# Patient Record
Sex: Male | Born: 1940
Health system: Southern US, Community
[De-identification: ages and names within clinical notes are randomized; demographics above are authoritative.]

## PROBLEM LIST (undated history)

## (undated) DIAGNOSIS — R350 Frequency of micturition: Secondary | ICD-10-CM

## (undated) DIAGNOSIS — I10 Essential (primary) hypertension: Secondary | ICD-10-CM

## (undated) DIAGNOSIS — F0390 Unspecified dementia without behavioral disturbance: Secondary | ICD-10-CM

## (undated) DIAGNOSIS — H269 Unspecified cataract: Secondary | ICD-10-CM

## (undated) DIAGNOSIS — R339 Retention of urine, unspecified: Secondary | ICD-10-CM

## (undated) DIAGNOSIS — E119 Type 2 diabetes mellitus without complications: Secondary | ICD-10-CM

## (undated) DIAGNOSIS — K648 Other hemorrhoids: Secondary | ICD-10-CM

## (undated) DIAGNOSIS — F209 Schizophrenia, unspecified: Secondary | ICD-10-CM

## (undated) DIAGNOSIS — K760 Fatty (change of) liver, not elsewhere classified: Secondary | ICD-10-CM

## (undated) DIAGNOSIS — R269 Unspecified abnormalities of gait and mobility: Secondary | ICD-10-CM

## (undated) DIAGNOSIS — M1990 Unspecified osteoarthritis, unspecified site: Secondary | ICD-10-CM

## (undated) DIAGNOSIS — R600 Localized edema: Secondary | ICD-10-CM

## (undated) DIAGNOSIS — E785 Hyperlipidemia, unspecified: Secondary | ICD-10-CM

## (undated) DIAGNOSIS — R609 Edema, unspecified: Secondary | ICD-10-CM

## (undated) DIAGNOSIS — D126 Benign neoplasm of colon, unspecified: Secondary | ICD-10-CM

## (undated) DIAGNOSIS — F329 Major depressive disorder, single episode, unspecified: Secondary | ICD-10-CM

## (undated) DIAGNOSIS — R5383 Other fatigue: Secondary | ICD-10-CM

## (undated) DIAGNOSIS — F32A Depression, unspecified: Secondary | ICD-10-CM

## (undated) DIAGNOSIS — R5381 Other malaise: Secondary | ICD-10-CM

## (undated) HISTORY — DX: Unspecified abnormalities of gait and mobility: R26.9

## (undated) HISTORY — DX: Other fatigue: R53.83

## (undated) HISTORY — DX: Type 2 diabetes mellitus without complications: E11.9

## (undated) HISTORY — PX: TONSILLECTOMY: SUR1361

## (undated) HISTORY — DX: Other malaise: R53.81

---

## 1999-08-23 ENCOUNTER — Other Ambulatory Visit: Admission: RE | Admit: 1999-08-23 | Discharge: 1999-08-23 | Payer: Self-pay | Admitting: Gastroenterology

## 1999-08-23 ENCOUNTER — Encounter (INDEPENDENT_AMBULATORY_CARE_PROVIDER_SITE_OTHER): Payer: Self-pay | Admitting: Specialist

## 2000-08-05 ENCOUNTER — Emergency Department (HOSPITAL_COMMUNITY): Admission: EM | Admit: 2000-08-05 | Discharge: 2000-08-05 | Payer: Self-pay | Admitting: Emergency Medicine

## 2006-07-24 ENCOUNTER — Encounter (INDEPENDENT_AMBULATORY_CARE_PROVIDER_SITE_OTHER): Payer: Self-pay | Admitting: *Deleted

## 2006-07-24 ENCOUNTER — Ambulatory Visit (HOSPITAL_COMMUNITY): Admission: RE | Admit: 2006-07-24 | Discharge: 2006-07-24 | Payer: Self-pay | Admitting: *Deleted

## 2009-03-10 ENCOUNTER — Emergency Department (HOSPITAL_COMMUNITY): Admission: EM | Admit: 2009-03-10 | Discharge: 2009-03-10 | Payer: Self-pay | Admitting: Emergency Medicine

## 2009-05-17 ENCOUNTER — Emergency Department (HOSPITAL_COMMUNITY): Admission: EM | Admit: 2009-05-17 | Discharge: 2009-05-17 | Payer: Self-pay | Admitting: Emergency Medicine

## 2009-09-28 ENCOUNTER — Encounter: Admission: RE | Admit: 2009-09-28 | Discharge: 2009-09-28 | Payer: Self-pay | Admitting: Neurology

## 2009-12-28 ENCOUNTER — Emergency Department (HOSPITAL_COMMUNITY): Admission: EM | Admit: 2009-12-28 | Discharge: 2009-05-28 | Payer: Self-pay | Admitting: Emergency Medicine

## 2010-06-05 NOTE — Op Note (Signed)
NAMECASTULO, SCARPELLI               ACCOUNT NO.:  [id number]   MEDICAL RECORD NO.:  [id number]          PATIENT TYPE:  AMB   LOCATION:  ENDO                         FACILITY:  [facility]   PHYSICIAN:  Georgiana Spinner, M.D.    DATE OF BIRTH:  05-01-1940   DATE OF PROCEDURE:  07/24/2006  DATE OF DISCHARGE:                               OPERATIVE REPORT   PROCEDURE:  Colonoscopy with polypectomy.   INDICATIONS:  Colon polyp.   ANESTHESIA:  Fentanyl 100 mcg, Versed 10 mg.   DESCRIPTION OF PROCEDURE:  With the patient mildly sedated in the left  lateral decubitus position, a rectal exam was performed which was  unremarkable.  Subsequently, the Pentax videoscopic colonoscope was  inserted in the rectum and passed under direct vision to the cecum  identified by the ileocecal valve and appendiceal orifice, both of which  were photographed. From this point, the colonoscope was slowly withdrawn  taking circumferential views of the colonic mucosa stopping in the  transverse colon where a polyp was seen, photographed, and removed using  snare cautery technique setting of 20/150 blended current.  The tissue  was then suctioned into the endoscope and retrieved via the tissue trap.  The endoscope was then further withdrawn taking circumferential views of  the remaining colonic mucosa stopping in the rectum which appeared  normal on direct and showed hemorrhoids on retroflexed view. The  endoscope was straightened and withdrawn.  The patient's vital signs and  pulse oximeter remained stable.  The patient tolerated the procedure  well without apparent complications.   FINDINGS:  Polyp of transverse colon removed.  Internal hemorrhoids.  Otherwise, unremarkable exam.   PLAN:  Await biopsy report.  The patient will call me for results and  follow up with me as needed as an outpatient.           ______________________________  Georgiana Spinner, M.D.     GMO/MEDQ  D:  07/24/2006  T:  07/24/2006  Job:   [id number]

## 2010-07-29 ENCOUNTER — Emergency Department (HOSPITAL_COMMUNITY)
Admission: EM | Admit: 2010-07-29 | Discharge: 2010-07-29 | Disposition: A | Discharge status: Home / Self Care | Payer: Medicare Other | Attending: Emergency Medicine | Admitting: Emergency Medicine

## 2010-07-29 ENCOUNTER — Emergency Department (HOSPITAL_COMMUNITY): Payer: Medicare Other

## 2010-07-29 DIAGNOSIS — I10 Essential (primary) hypertension: Secondary | ICD-10-CM | POA: Insufficient documentation

## 2010-07-29 DIAGNOSIS — E119 Type 2 diabetes mellitus without complications: Secondary | ICD-10-CM | POA: Insufficient documentation

## 2010-07-29 DIAGNOSIS — F329 Major depressive disorder, single episode, unspecified: Secondary | ICD-10-CM | POA: Insufficient documentation

## 2010-07-29 DIAGNOSIS — Z79899 Other long term (current) drug therapy: Secondary | ICD-10-CM | POA: Insufficient documentation

## 2010-07-29 DIAGNOSIS — F3289 Other specified depressive episodes: Secondary | ICD-10-CM | POA: Insufficient documentation

## 2010-07-29 DIAGNOSIS — M19019 Primary osteoarthritis, unspecified shoulder: Secondary | ICD-10-CM | POA: Insufficient documentation

## 2011-12-23 ENCOUNTER — Other Ambulatory Visit: Payer: Self-pay | Admitting: Endocrinology

## 2011-12-23 DIAGNOSIS — R1011 Right upper quadrant pain: Secondary | ICD-10-CM

## 2011-12-31 ENCOUNTER — Ambulatory Visit
Admission: RE | Admit: 2011-12-31 | Discharge: 2011-12-31 | Disposition: A | Discharge status: Home / Self Care | Payer: Medicare Other | Source: Ambulatory Visit | Attending: Endocrinology | Admitting: Endocrinology

## 2011-12-31 DIAGNOSIS — R1011 Right upper quadrant pain: Secondary | ICD-10-CM

## 2012-01-01 ENCOUNTER — Other Ambulatory Visit: Payer: Self-pay | Admitting: Gastroenterology

## 2012-01-01 ENCOUNTER — Ambulatory Visit: Payer: Medicare Other | Admitting: *Deleted

## 2012-01-03 ENCOUNTER — Encounter (HOSPITAL_COMMUNITY): Payer: Self-pay | Admitting: Pharmacy Technician

## 2012-01-07 ENCOUNTER — Emergency Department (HOSPITAL_COMMUNITY): Payer: Medicare Other

## 2012-01-07 ENCOUNTER — Encounter (HOSPITAL_COMMUNITY): Payer: Self-pay | Admitting: *Deleted

## 2012-01-07 ENCOUNTER — Emergency Department (HOSPITAL_COMMUNITY)
Admission: EM | Admit: 2012-01-07 | Discharge: 2012-01-07 | Disposition: A | Discharge status: Home / Self Care | Payer: Medicare Other | Attending: Emergency Medicine | Admitting: Emergency Medicine

## 2012-01-07 DIAGNOSIS — Y939 Activity, unspecified: Secondary | ICD-10-CM | POA: Insufficient documentation

## 2012-01-07 DIAGNOSIS — E119 Type 2 diabetes mellitus without complications: Secondary | ICD-10-CM | POA: Insufficient documentation

## 2012-01-07 DIAGNOSIS — Z79899 Other long term (current) drug therapy: Secondary | ICD-10-CM | POA: Insufficient documentation

## 2012-01-07 DIAGNOSIS — Y929 Unspecified place or not applicable: Secondary | ICD-10-CM | POA: Insufficient documentation

## 2012-01-07 DIAGNOSIS — F3289 Other specified depressive episodes: Secondary | ICD-10-CM | POA: Insufficient documentation

## 2012-01-07 DIAGNOSIS — F0390 Unspecified dementia without behavioral disturbance: Secondary | ICD-10-CM | POA: Insufficient documentation

## 2012-01-07 DIAGNOSIS — IMO0001 Reserved for inherently not codable concepts without codable children: Secondary | ICD-10-CM | POA: Insufficient documentation

## 2012-01-07 DIAGNOSIS — X58XXXA Exposure to other specified factors, initial encounter: Secondary | ICD-10-CM | POA: Insufficient documentation

## 2012-01-07 DIAGNOSIS — F329 Major depressive disorder, single episode, unspecified: Secondary | ICD-10-CM | POA: Insufficient documentation

## 2012-01-07 DIAGNOSIS — I10 Essential (primary) hypertension: Secondary | ICD-10-CM | POA: Insufficient documentation

## 2012-01-07 DIAGNOSIS — S29012A Strain of muscle and tendon of back wall of thorax, initial encounter: Secondary | ICD-10-CM

## 2012-01-07 DIAGNOSIS — S239XXA Sprain of unspecified parts of thorax, initial encounter: Secondary | ICD-10-CM | POA: Insufficient documentation

## 2012-01-07 HISTORY — DX: Depression, unspecified: F32.A

## 2012-01-07 HISTORY — DX: Major depressive disorder, single episode, unspecified: F32.9

## 2012-01-07 HISTORY — DX: Unspecified dementia, unspecified severity, without behavioral disturbance, psychotic disturbance, mood disturbance, and anxiety: F03.90

## 2012-01-07 HISTORY — DX: Essential (primary) hypertension: I10

## 2012-01-07 HISTORY — DX: Type 2 diabetes mellitus without complications: E11.9

## 2012-01-07 MED ORDER — TRAMADOL HCL 50 MG PO TABS
50.0000 mg | ORAL_TABLET | Freq: Once | ORAL | Status: AC
  Administered 2012-01-07: 50 mg via ORAL
  Filled 2012-01-07: qty 1

## 2012-01-07 MED ORDER — TRAMADOL HCL 50 MG PO TABS: 50.0000 mg | ORAL_TABLET | Freq: Four times a day (QID) | ORAL | Status: DC | PRN

## 2012-01-07 MED ORDER — METHOCARBAMOL 500 MG PO TABS
500.0000 mg | ORAL_TABLET | Freq: Once | ORAL | Status: AC
  Administered 2012-01-07: 500 mg via ORAL
  Filled 2012-01-07: qty 1

## 2012-01-07 MED ORDER — METHOCARBAMOL 500 MG PO TABS: 500.0000 mg | ORAL_TABLET | Freq: Two times a day (BID) | ORAL | Status: DC

## 2012-01-07 NOTE — ED Provider Notes (Signed)
History     CSN: [id number]  Arrival date & time 01/07/12  0865   First MD Initiated Contact with Patient 01/07/12 [phone]      Chief Complaint  Patient presents with  . Back Pain    (Consider location/radiation/quality/duration/timing/severity/associated sxs/prior treatment) HPI Pt has had 3 weeks of upper back pain in R paraspinal area of thoracic spine worse with movement. No known trauma. No cough, SOB, CP, fever or chills. No focal weakness or sensory changes.  Past Medical History  Diagnosis Date  . Diabetes mellitus without complication   . Hypertension   . Dementia   . Depression     Past Surgical History  Procedure Date  . Tonsillectomy     No family history on file.  History  Substance Use Topics  . Smoking status: Never Smoker   . Smokeless tobacco: Not on file  . Alcohol Use: No      Review of Systems  Constitutional: Negative for fever and chills.  HENT: Negative for neck pain and neck stiffness.   Respiratory: Negative for cough, chest tightness, shortness of breath and wheezing.   Cardiovascular: Negative for chest pain, palpitations and leg swelling.  Gastrointestinal: Negative for nausea, vomiting and abdominal pain.  Musculoskeletal: Positive for myalgias and back pain.  Skin: Negative for rash and wound.  Neurological: Negative for dizziness, weakness, light-headedness, numbness and headaches.  All other systems reviewed and are negative.    Allergies  Codeine and Tylenol  Home Medications   Current Outpatient Rx  Name  Route  Sig  Dispense  Refill  . ALPRAZOLAM 0.5 MG PO TABS   Oral   Take 1 mg by mouth every evening.          . CELECOXIB 200 MG PO CAPS   Oral   Take 200 mg by mouth 2 (two) times daily.         Marland Kitchen CLOBETASOL PROPIONATE 0.05 % EX OINT   Topical   Apply 1 application topically 2 (two) times daily.         Marland Kitchen FLUVOXAMINE MALEATE 100 MG PO TABS   Oral   Take 100 mg by mouth at bedtime.          .  FUROSEMIDE 20 MG PO TABS   Oral   Take 20 mg by mouth daily before breakfast.          . GLIMEPIRIDE 2 MG PO TABS   Oral   Take 2 mg by mouth daily before breakfast.         . HYDROXYZINE HCL 25 MG PO TABS   Oral   Take 25 mg by mouth at bedtime.         Marland Kitchen MINOCYCLINE HCL 50 MG PO TABS   Oral   Take 50 mg by mouth daily.         Marland Kitchen MOEXIPRIL-HYDROCHLOROTHIAZIDE 15-12.5 MG PO TABS   Oral   Take 1 tablet by mouth daily after lunch.          Marland Kitchen NAPROXEN SODIUM 220 MG PO TABS   Oral   Take 220 mg by mouth 2 (two) times daily with a meal.         . OLANZAPINE 20 MG PO TABS   Oral   Take 20 mg by mouth at bedtime.         . ONGLYZA 5 MG PO TABS   Oral   Take 5 mg by mouth daily before breakfast.          .  PRAVASTATIN SODIUM 40 MG PO TABS   Oral   Take 40 mg by mouth daily after lunch.          . METHOCARBAMOL 500 MG PO TABS   Oral   Take 1 tablet (500 mg total) by mouth 2 (two) times daily.   20 tablet   0   . TRAMADOL HCL 50 MG PO TABS   Oral   Take 1 tablet (50 mg total) by mouth every 6 (six) hours as needed for pain.   15 tablet   0   . AVO CREAM EX   Apply externally   Apply 1 application topically every evening.           BP 156/76  Pulse 84  Temp 98.3 F (36.8 C) (Oral)  Resp 20  SpO2 96%  Physical Exam  Nursing note and vitals reviewed. Constitutional: He is oriented to person, place, and time. He appears well-developed and well-nourished. No distress.  HENT:  Head: Normocephalic and atraumatic.  Mouth/Throat: Oropharynx is clear and moist.  Eyes: EOM are normal. Pupils are equal, round, and reactive to light.  Neck: Normal range of motion. Neck supple.  Cardiovascular: Normal rate and regular rhythm.   Pulmonary/Chest: Effort normal and breath sounds normal. No respiratory distress. He has no wheezes. He has no rales. He exhibits no tenderness.  Abdominal: Soft. Bowel sounds are normal. He exhibits no distension and no mass.  There is no tenderness. There is no rebound and no guarding.  Musculoskeletal: Normal range of motion. He exhibits tenderness (Mid thoracic R paraspinal TTP. No deformity or injury. ). He exhibits no edema.  Neurological: He is alert and oriented to person, place, and time.       5/5 motor in all ext, sensation intact.   Skin: Skin is warm and dry. No rash noted. No erythema.  Psychiatric: He has a normal mood and affect. His behavior is normal.    ED Course  Procedures (including critical care time)  Labs Reviewed - No data to display Dg Chest 2 View  01/07/2012  *RADIOLOGY REPORT*  Clinical Data: Chest pain.  CHEST - 2 VIEW  Comparison: 12/18/2011.  Findings: The cardiac silhouette, mediastinal and hilar contours are within normal limits and stable given the rotation of the patient.  The lungs are clear.  The bony thorax is intact. Advanced generative changes are noted in the thoracic spine.  IMPRESSION: No acute cardiopulmonary findings.   Original Report Authenticated By: Rudie Meyer, M.D.      1. Muscle strain of right upper back       MDM  Well appearing. Xray to rule out fracture. Will treat symptomatically.    Pt states pain is completely resolved.      Loren Racer, MD 01/07/12 [phone]

## 2012-01-07 NOTE — ED Notes (Signed)
Pt in radiology 

## 2012-01-07 NOTE — ED Notes (Addendum)
Per PTAR- pt has had back pain for several weeks. Pain was worse this morning. Pt states that pain is worse with trying to stand. Pain is in rt upper and mid back.

## 2012-01-10 ENCOUNTER — Encounter (HOSPITAL_COMMUNITY): Admission: RE | Payer: Self-pay | Source: Ambulatory Visit

## 2012-01-10 ENCOUNTER — Ambulatory Visit (HOSPITAL_COMMUNITY): Admission: RE | Admit: 2012-01-10 | Payer: Medicare Other | Source: Ambulatory Visit | Admitting: Gastroenterology

## 2012-01-10 SURGERY — EGD (ESOPHAGOGASTRODUODENOSCOPY)
Anesthesia: Moderate Sedation

## 2012-01-29 ENCOUNTER — Ambulatory Visit: Payer: Medicare Other | Admitting: Dietician

## 2012-01-30 ENCOUNTER — Ambulatory Visit: Payer: Medicare Other | Admitting: *Deleted

## 2012-02-13 ENCOUNTER — Other Ambulatory Visit: Payer: Self-pay | Admitting: Gastroenterology

## 2012-02-20 ENCOUNTER — Ambulatory Visit: Payer: Medicare Other | Admitting: *Deleted

## 2012-02-20 ENCOUNTER — Encounter: Payer: Medicare Other | Attending: Endocrinology | Admitting: *Deleted

## 2012-02-20 ENCOUNTER — Encounter: Payer: Self-pay | Admitting: *Deleted

## 2012-02-20 VITALS — Ht 69.5 in | Wt 250.0 lb

## 2012-02-20 DIAGNOSIS — E119 Type 2 diabetes mellitus without complications: Secondary | ICD-10-CM

## 2012-02-20 DIAGNOSIS — Z713 Dietary counseling and surveillance: Secondary | ICD-10-CM | POA: Insufficient documentation

## 2012-02-20 NOTE — Progress Notes (Signed)
HbA1c: 8%  Medical Nutrition Therapy:  Appt start time: 1130 end time:  1230.   Assessment:  Primary concerns today: diabetes.  Ricky [name] is a 72 year old gentlemen who has been diagnosed with DMII for 6-7 years.  He lives in a retirement home and doesn't prepare his own meals. His short tem memory is very poor and he generally forgets all diabetes information.  His HbA1C is 8%   MEDICATIONS: see list   DIETARY INTAKE:  Usual eating pattern includes 2-3 meals and 1-3 snacks per day.  Everyday foods include starches, fruits, proteins, vegetable juice, and sweets.  Avoided foods include many vegetables.    24-hr recall:  B ( AM): 1-2 bowl cheerios with 2 large glasses tomato juice and sometimes melons  Snk ( AM): payday candy bar  L ( PM): goes out sometimes to cafeteria and gets talapia and maybe green beans.  Or eats in dining hall at [employer] and has fish with 2 glasses tomato juice and melon.  Sometimes goes to [employer] and gets hamburger and fries. Diet soda Snk ( PM): not usually D ( PM): sometimes eats in dining hall or sometimes caretaker brings food.  Oven roasted deli chicken breast or pizza Snk ( PM): diet cokes  Sometimes has Nabs Beverages: tomato juice, diet coke, sometimes water  Usual physical activity: wheelchair bound.  Does stationary bike most days  Estimated energy needs: 1800-2000 calories 200-225 g carbohydrates 135-150 g protein 50-56 g fat  Progress Towards Goal(s):  In progress.   Nutritional Diagnosis:  NB-1.1 Food and nutrition-related knowledge deficit As related to limited memory abilities.  As evidenced by inability to remember nutrition education from previous appoitnment or today's appointment..    Intervention:  Nutrition counseling provided.  Ricky [name] was referred for diabetes education.  It was evident very early on in the appointment that Ricky [name] has limited reading abilities and limited memory abilities.  Most of the information presented  today has to be repeated several times.  Ricky [name] brought his caregiver, Ricky [name], with him, and I presented Ricky [name] with some written materials.  Ricky [name] doesn't prepare his own meals- he wither eats out or eats in the dining hall at his facility.  He doesn't have that much control over his food choices.  I did encourage him to limit sweets like candy bars and ice cream.  He eats large portions of carbohydrates at lunch and/or dinner, and very little carbohydrates throughout the day.  He monitors his glucose and it's sometimes 90-125, but sometimes 300 mg/dl.  We discussed what a serving of carbohydrate looks like and we discussed MyPlate recommendations.  Encouraged smaller portions of carbohydrates and more lean proteins and vegetables at each meal.    Handouts given during visit include:  Living Well with Diabetes  MyPlate placemat  Monitoring/Evaluation:  Dietary intake, exercise, BGM, and body weight prn.

## 2012-02-20 NOTE — Patient Instructions (Addendum)
Aim for 3 meals and 1-2 snacks each day  At each meals and snack, aim for some more of carbohydrate, but a small portion; fruit or cereal or bread or potato or rice, but not too much  Choose also a protein like egg, fish, chicken, nuts  Choose a vegetable at lunch and dinner

## 2012-02-21 ENCOUNTER — Ambulatory Visit (HOSPITAL_COMMUNITY)
Admission: RE | Admit: 2012-02-21 | Discharge: 2012-02-21 | Disposition: A | Discharge status: Skilled Nursing Facility | Payer: Medicare Other | Source: Ambulatory Visit | Attending: Gastroenterology | Admitting: Gastroenterology

## 2012-02-21 ENCOUNTER — Encounter (HOSPITAL_COMMUNITY): Payer: Self-pay

## 2012-02-21 ENCOUNTER — Encounter (HOSPITAL_COMMUNITY)
Admission: RE | Disposition: A | Discharge status: Skilled Nursing Facility | Payer: Self-pay | Source: Ambulatory Visit | Attending: Gastroenterology

## 2012-02-21 DIAGNOSIS — K2970 Gastritis, unspecified, without bleeding: Secondary | ICD-10-CM | POA: Insufficient documentation

## 2012-02-21 DIAGNOSIS — K2990 Gastroduodenitis, unspecified, without bleeding: Secondary | ICD-10-CM | POA: Insufficient documentation

## 2012-02-21 DIAGNOSIS — K449 Diaphragmatic hernia without obstruction or gangrene: Secondary | ICD-10-CM | POA: Insufficient documentation

## 2012-02-21 DIAGNOSIS — A048 Other specified bacterial intestinal infections: Secondary | ICD-10-CM | POA: Insufficient documentation

## 2012-02-21 DIAGNOSIS — K2960 Other gastritis without bleeding: Secondary | ICD-10-CM | POA: Insufficient documentation

## 2012-02-21 DIAGNOSIS — K2270 Barrett's esophagus without dysplasia: Secondary | ICD-10-CM | POA: Insufficient documentation

## 2012-02-21 DIAGNOSIS — E785 Hyperlipidemia, unspecified: Secondary | ICD-10-CM | POA: Insufficient documentation

## 2012-02-21 DIAGNOSIS — K222 Esophageal obstruction: Secondary | ICD-10-CM | POA: Insufficient documentation

## 2012-02-21 DIAGNOSIS — E119 Type 2 diabetes mellitus without complications: Secondary | ICD-10-CM | POA: Insufficient documentation

## 2012-02-21 HISTORY — DX: Other hemorrhoids: K64.8

## 2012-02-21 HISTORY — DX: Localized edema: R60.0

## 2012-02-21 HISTORY — DX: Unspecified cataract: H26.9

## 2012-02-21 HISTORY — DX: Benign neoplasm of colon, unspecified: D12.6

## 2012-02-21 HISTORY — DX: Frequency of micturition: R35.0

## 2012-02-21 HISTORY — DX: Schizophrenia, unspecified: F20.9

## 2012-02-21 HISTORY — DX: Unspecified osteoarthritis, unspecified site: M19.90

## 2012-02-21 HISTORY — DX: Fatty (change of) liver, not elsewhere classified: K76.0

## 2012-02-21 HISTORY — DX: Edema, unspecified: R60.9

## 2012-02-21 HISTORY — DX: Hyperlipidemia, unspecified: E78.5

## 2012-02-21 HISTORY — PX: ESOPHAGOGASTRODUODENOSCOPY: SHX5428

## 2012-02-21 SURGERY — EGD (ESOPHAGOGASTRODUODENOSCOPY)
Anesthesia: Moderate Sedation

## 2012-02-21 MED ORDER — BUTAMBEN-TETRACAINE-BENZOCAINE 2-2-14 % EX AERO
INHALATION_SPRAY | CUTANEOUS | Status: DC | PRN
  Administered 2012-02-21: 2 via TOPICAL

## 2012-02-21 MED ORDER — FENTANYL CITRATE 0.05 MG/ML IJ SOLN
INTRAMUSCULAR | Status: DC | PRN
  Administered 2012-02-21: 25 ug via INTRAVENOUS
  Administered 2012-02-21: 12.5 ug via INTRAVENOUS
  Administered 2012-02-21: 25 ug via INTRAVENOUS

## 2012-02-21 MED ORDER — MIDAZOLAM HCL 10 MG/2ML IJ SOLN
INTRAMUSCULAR | Status: DC | PRN
  Administered 2012-02-21: 1 mg via INTRAVENOUS
  Administered 2012-02-21 (×2): 2 mg via INTRAVENOUS

## 2012-02-21 MED ORDER — SODIUM CHLORIDE 0.9 % IV SOLN
INTRAVENOUS | Status: DC
  Administered 2012-02-21: 09:00:00 via INTRAVENOUS

## 2012-02-21 MED ORDER — FENTANYL CITRATE 0.05 MG/ML IJ SOLN
INTRAMUSCULAR | Status: AC
  Filled 2012-02-21: qty 2

## 2012-02-21 MED ORDER — MIDAZOLAM HCL 10 MG/2ML IJ SOLN
INTRAMUSCULAR | Status: AC
  Filled 2012-02-21: qty 2

## 2012-02-21 MED ORDER — DIPHENHYDRAMINE HCL 50 MG/ML IJ SOLN
INTRAMUSCULAR | Status: AC
  Filled 2012-02-21: qty 1

## 2012-02-21 NOTE — Op Note (Signed)
[facility] [address] [place], [address]   OPERATIVE PROCEDURE REPORT  PATIENT: Ricky [name], Ricky [name]  MR#: [id number] BIRTHDATE: 1941/01/15  GENDER: Male ENDOSCOPIST: Jeani Hawking, MD ASSISTANT:   Felecia Shelling, RN, Harold Barban, RN, and Oletha Blend, technician PROCEDURE DATE: 02/21/2012 PROCEDURE:   EGD w/ biopsy and Savary dilation of esophagus ASA CLASS:   Class III INDICATIONS:Dysphagia. MEDICATIONS: Versed 5 mg IV and Fentanyl 62.5 mcg IV TOPICAL ANESTHETIC:   Cetacaine Spray  DESCRIPTION OF PROCEDURE:   After the risks benefits and alternatives of the procedure were thoroughly explained, informed consent was obtained.  The Pentax Gastroscope [id number]  endoscope was introduced through the mouth  and advanced to the second portion of the duodenum Without limitations.      The instrument was slowly withdrawn as the mucosa was fully examined.  FINDINGS: Just below the UES starting at 20-22 cm the Z-line was encountered.  It was apparent that the patient as gross evidence of Barrett's esophagus.  The abnormal mucosa extended to 40 cm.  No evidence of any nodules or masses and cold biopsies were obtinaed in a four quadrant fashion in approximate 2 cm intervals.  In the uper esophagus several rings were identified and I suspec that they are peptic in nature rather than EoE. A Savary dilation over a guidewire with the 16 and 17 mm dilators was performed.  Only with the 17 mm dilation was there evidence of mucosal disruption in the distal upper esophageal ring.  The 16 mm dilation was negative for mucosal disruption with the relook endoscopy.  A 3 cm hiatal hernia was identified.  A mild gastritis in the body of the stomach was biopsied with the cold biopsy forceps.   Retroflexed views revealed no abnormalities.     The scope was then withdrawn from the patient and the procedure terminated.  COMPLICATIONS: There were no complications.  IMPRESSION: 1)  Suspected long segment Barrett's esophagus. 2) Upper esophageal rings. 3) 3 cm hiatal hernia. 4) Gastritis.  RECOMMENDATIONS: 1) Await biopsy results. 2) Start Omeprazole 40 mg QD. 3) One month office follow up. _______________________________ eSigned:  Jeani Hawking, MD 02/21/2012 10:22 AM    PATIENT NAME:  Grant, Swager MR#: [id number]

## 2012-02-21 NOTE — H&P (Signed)
Gareth Eagle HPI: This is a 72 year old male with complaints of dysphagia to both solids and liquids, however, he seems to have a solid food predominance.  At times he feels that his food will go down his "windpipe".  No prior history of stokes and he denies any problems with odynophagia.  Past Medical History  Diagnosis Date  . Diabetes mellitus without complication   . Hypertension   . Dementia   . Depression   . Arthritis   . Hyperlipidemia   . Cataract   . Urinary frequency   . Fatty liver   . Schizophrenia   . Peripheral edema   . Internal hemorrhoids   . Adenomatous colon polyp     Past Surgical History  Procedure Date  . Tonsillectomy     History reviewed. No pertinent family history.  Social History:  reports that he has never smoked. He does not have any smokeless tobacco history on file. He reports that he does not drink alcohol or use illicit drugs.  Allergies:  Allergies  Allergen Reactions  . Asa (Aspirin)     "Dr told me not to take it"  . Codeine Other (See Comments)    DIZZINESS with Tylenol 3  . Tylenol (Acetaminophen) Other (See Comments)    Dizziness with tylenol 3    Medications:  Scheduled:  Continuous:   . sodium chloride 20 mL/hr at 02/21/12 3086    Results for orders placed during the hospital encounter of 02/21/12 (from the past 24 hour(s))  GLUCOSE, CAPILLARY     Status: Abnormal   Collection Time   02/21/12  9:21 AM      Component Value Range   Glucose-Capillary 141 (*) 70 - 99 mg/dL     No results found.  ROS:  As stated above in the HPI otherwise negative.  Blood pressure 142/89, temperature 97.5 F (36.4 C), temperature source Oral, resp. rate 15, SpO2 99.00%.    PE: Gen: NAD, Alert and Oriented HEENT:  [place]/AT, EOMI Neck: Supple, no LAD Lungs: CTA Bilaterally CV: RRR without M/G/R ABM: Soft, NTND, +BS Ext: No C/C/E  Assessment/Plan: 1) Dysphagia.  Plan: 1) EGD +/- Dilation  Koua Deeg D 02/21/2012, 9:35 AM

## 2012-02-24 ENCOUNTER — Encounter (HOSPITAL_COMMUNITY): Payer: Self-pay | Admitting: Gastroenterology

## 2012-03-20 ENCOUNTER — Encounter: Payer: Self-pay | Admitting: *Deleted

## 2012-03-20 DIAGNOSIS — R5383 Other fatigue: Secondary | ICD-10-CM

## 2012-03-20 DIAGNOSIS — E1149 Type 2 diabetes mellitus with other diabetic neurological complication: Secondary | ICD-10-CM

## 2012-03-20 DIAGNOSIS — R5381 Other malaise: Secondary | ICD-10-CM | POA: Insufficient documentation

## 2012-03-20 DIAGNOSIS — R269 Unspecified abnormalities of gait and mobility: Secondary | ICD-10-CM | POA: Insufficient documentation

## 2012-04-20 ENCOUNTER — Ambulatory Visit: Payer: Self-pay | Admitting: Neurology

## 2012-05-13 ENCOUNTER — Other Ambulatory Visit: Payer: Self-pay | Admitting: Gastroenterology

## 2012-05-22 ENCOUNTER — Encounter (HOSPITAL_COMMUNITY): Payer: Self-pay | Admitting: *Deleted

## 2012-05-22 ENCOUNTER — Ambulatory Visit (HOSPITAL_COMMUNITY)
Admission: RE | Admit: 2012-05-22 | Discharge: 2012-05-22 | Disposition: A | Discharge status: Skilled Nursing Facility | Payer: Medicare Other | Source: Ambulatory Visit | Attending: Gastroenterology | Admitting: Gastroenterology

## 2012-05-22 ENCOUNTER — Encounter (HOSPITAL_COMMUNITY)
Admission: RE | Disposition: A | Discharge status: Skilled Nursing Facility | Payer: Self-pay | Source: Ambulatory Visit | Attending: Gastroenterology

## 2012-05-22 DIAGNOSIS — K222 Esophageal obstruction: Secondary | ICD-10-CM | POA: Insufficient documentation

## 2012-05-22 DIAGNOSIS — E119 Type 2 diabetes mellitus without complications: Secondary | ICD-10-CM | POA: Insufficient documentation

## 2012-05-22 DIAGNOSIS — F0390 Unspecified dementia without behavioral disturbance: Secondary | ICD-10-CM | POA: Insufficient documentation

## 2012-05-22 DIAGNOSIS — I10 Essential (primary) hypertension: Secondary | ICD-10-CM | POA: Insufficient documentation

## 2012-05-22 DIAGNOSIS — K2270 Barrett's esophagus without dysplasia: Secondary | ICD-10-CM | POA: Insufficient documentation

## 2012-05-22 DIAGNOSIS — K449 Diaphragmatic hernia without obstruction or gangrene: Secondary | ICD-10-CM | POA: Insufficient documentation

## 2012-05-22 DIAGNOSIS — K7689 Other specified diseases of liver: Secondary | ICD-10-CM | POA: Insufficient documentation

## 2012-05-22 DIAGNOSIS — Z886 Allergy status to analgesic agent status: Secondary | ICD-10-CM | POA: Insufficient documentation

## 2012-05-22 DIAGNOSIS — Z885 Allergy status to narcotic agent status: Secondary | ICD-10-CM | POA: Insufficient documentation

## 2012-05-22 DIAGNOSIS — F209 Schizophrenia, unspecified: Secondary | ICD-10-CM | POA: Insufficient documentation

## 2012-05-22 DIAGNOSIS — K648 Other hemorrhoids: Secondary | ICD-10-CM | POA: Insufficient documentation

## 2012-05-22 DIAGNOSIS — E785 Hyperlipidemia, unspecified: Secondary | ICD-10-CM | POA: Insufficient documentation

## 2012-05-22 HISTORY — PX: BALLOON DILATION: SHX5330

## 2012-05-22 HISTORY — PX: ESOPHAGOGASTRODUODENOSCOPY: SHX5428

## 2012-05-22 LAB — GLUCOSE, CAPILLARY: Glucose-Capillary: 146 mg/dL — ABNORMAL HIGH (ref 70–99)

## 2012-05-22 SURGERY — EGD (ESOPHAGOGASTRODUODENOSCOPY)
Anesthesia: Moderate Sedation

## 2012-05-22 MED ORDER — BUTAMBEN-TETRACAINE-BENZOCAINE 2-2-14 % EX AERO
INHALATION_SPRAY | CUTANEOUS | Status: DC | PRN
  Administered 2012-05-22: 2 via TOPICAL

## 2012-05-22 MED ORDER — FENTANYL CITRATE 0.05 MG/ML IJ SOLN
INTRAMUSCULAR | Status: AC
  Filled 2012-05-22: qty 4

## 2012-05-22 MED ORDER — SODIUM CHLORIDE 0.9 % IV SOLN: INTRAVENOUS | Status: DC

## 2012-05-22 MED ORDER — FENTANYL CITRATE 0.05 MG/ML IJ SOLN
INTRAMUSCULAR | Status: DC | PRN
  Administered 2012-05-22 (×3): 25 ug via INTRAVENOUS

## 2012-05-22 MED ORDER — MIDAZOLAM HCL 10 MG/2ML IJ SOLN
INTRAMUSCULAR | Status: DC | PRN
  Administered 2012-05-22 (×3): 2.5 mg via INTRAVENOUS

## 2012-05-22 MED ORDER — MIDAZOLAM HCL 10 MG/2ML IJ SOLN
INTRAMUSCULAR | Status: AC
  Filled 2012-05-22: qty 4

## 2012-05-22 NOTE — Op Note (Signed)
[facility] [address] [place], [address]   OPERATIVE PROCEDURE REPORT  PATIENT: Ricky [name], Ricky [name]  MR#: [id number] BIRTHDATE: [birth year]  GENDER: Male ENDOSCOPIST: Jeani Hawking, MD ASSISTANT:   Cathlean Marseilles, RN, CGRN and Oletha Blend, technician PROCEDURE DATE: 05/22/2012 PROCEDURE:   Savary dilation of esophagus ASA CLASS:   Class III INDICATIONS:Dysphagia. MEDICATIONS: Versed-Detailed 7.5 mg IV and Fentanyl 75 mcg IV TOPICAL ANESTHETIC:   Cetacaine Spray  DESCRIPTION OF PROCEDURE:   After the risks benefits and alternatives of the procedure were thoroughly explained, informed consent was obtained.  The     endoscope was introduced through the mouth  and advanced to the second portion of the duodenum Without limitations.      The instrument was slowly withdrawn as the mucosa was fully examined.   FINDINGS: A mildly strictured esophageal ring was again identified at 25 cm.  A couple of other proximal rings were noted, but these were not strictured.  The pan-Barrett's esophagus was also noted as well as a 3 cm hiatal hernia.  No other abnormalities found in the upper GI tract.  Over a guidewire a one time dilation with the 17 mm Savary dilator was performed.  The relook endoscopy was significant for the expected esophageal tear at the stricture.  No evidence of post procedure crepitus with palpation.   Retroflexed views revealed no abnormalities.     The scope was then withdrawn from the patient and the procedure terminated.  COMPLICATIONS: There were no complications. IMPRESSION: 1) Esophageal ring at 25 cm - mildly strictured s/p 17 mm Savary dilation. 2) Pan-Barrett's esophagus. 3) 3 cm hiatal hernia.  RECOMMENDATIONS: 1) Continue with omeprazole. 2) Follow up or call as needed. 3) EGD with dilation PRN.   _______________________________ Rosalie DoctorJeani Hawking, MD 05/22/2012 12:35 PM

## 2012-05-22 NOTE — H&P (Signed)
Reason for Consult: Dysphagia with history of mild esophageal rings with resultant strictures Referring Physician: Darci Needle, M.D.  Gareth Eagle HPI: This is a 72 year old male with a prior history of dysphagia.  His most recent EGD late last year revealed peptic esophageal rings that were successfully dilated with the 17 mm Savary dilator in the setting of a 3 cm hiatal hernia.  He was placed on omeprazole indefinitely, but he reports a recurrence of his symptoms.  The patient was also identified to have a pan-Barrett's esophagus that was negative for any LGD or HGD with biopsies.  After the dilation the patient reported a 90% improvement in his symptoms.  Past Medical History  Diagnosis Date  . Diabetes mellitus without complication   . Hypertension   . Dementia   . Depression   . Arthritis   . Hyperlipidemia   . Cataract   . Urinary frequency   . Fatty liver   . Schizophrenia   . Peripheral edema   . Internal hemorrhoids   . Adenomatous colon polyp     Past Surgical History  Procedure Laterality Date  . Tonsillectomy    . Esophagogastroduodenoscopy  02/21/2012    Procedure: ESOPHAGOGASTRODUODENOSCOPY (EGD);  Surgeon: Theda Belfast, MD;  Location: [facility];  Service: Endoscopy;  Laterality: N/A;    History reviewed. No pertinent family history.  Social History:  reports that he has never smoked. He has never used smokeless tobacco. He reports that he does not drink alcohol or use illicit drugs.  Allergies:  Allergies  Allergen Reactions  . Asa (Aspirin)     "Dr told me not to take it"  . Codeine Other (See Comments)    DIZZINESS with Tylenol 3  . Tylenol (Acetaminophen) Other (See Comments)    Dizziness with tylenol 3    Medications:  Scheduled:  Continuous: . sodium chloride      Results for orders placed during the hospital encounter of 05/22/12 (from the past 24 hour(s))  GLUCOSE, CAPILLARY     Status: Abnormal   Collection Time    05/22/12 11:56  AM      Result Value Range   Glucose-Capillary 146 (*) 70 - 99 mg/dL     No results found.  ROS:  As stated above in the HPI otherwise negative.  Blood pressure 151/108, temperature 98.1 F (36.7 C), temperature source Oral, resp. rate 15, height 5\' 9"  (1.753 m), weight 248 lb (112.492 kg), SpO2 100.00%.    PE: Gen: NAD, Alert and Oriented HEENT:  [place]/AT, EOMI Neck: Supple, no LAD Lungs: CTA Bilaterally CV: RRR without M/G/R ABM: Soft, NTND, +BS Ext: No C/C/E  Assessment/Plan: 1) Dysphagia.   I did not anticipate a relatively rapid recurrence of his symptoms, but I will repeat the EGD with dilation.  Plan: 1) EGD now.  Samari Gorby D 05/22/2012, 12:05 PM

## 2012-05-25 ENCOUNTER — Encounter (HOSPITAL_COMMUNITY): Payer: Self-pay | Admitting: Gastroenterology

## 2012-06-22 ENCOUNTER — Telehealth: Payer: Self-pay | Admitting: Neurology

## 2012-06-26 ENCOUNTER — Telehealth: Payer: Self-pay | Admitting: Neurology

## 2012-06-26 NOTE — Telephone Encounter (Signed)
Gwenn calling from [employer] out of [place], [place].  She's waiting for a fax from [place] regarding this patient's repairs for his wheelchair.  Her phone number is:  [phone]

## 2012-06-30 NOTE — Telephone Encounter (Signed)
Returned [employer]. Confirmed fax received.

## 2012-06-30 NOTE — Telephone Encounter (Signed)
I have faxed over Rx for repairs of wheelchair to [facility] at [phone], confirmation received.

## 2012-07-20 ENCOUNTER — Encounter: Payer: Self-pay | Admitting: Neurology

## 2012-07-20 ENCOUNTER — Ambulatory Visit (INDEPENDENT_AMBULATORY_CARE_PROVIDER_SITE_OTHER): Payer: Self-pay | Admitting: Neurology

## 2012-07-20 VITALS — BP 169/105 | HR 104

## 2012-07-20 DIAGNOSIS — F32A Depression, unspecified: Secondary | ICD-10-CM

## 2012-07-20 DIAGNOSIS — R350 Frequency of micturition: Secondary | ICD-10-CM

## 2012-07-20 DIAGNOSIS — E119 Type 2 diabetes mellitus without complications: Secondary | ICD-10-CM | POA: Insufficient documentation

## 2012-07-20 DIAGNOSIS — M1990 Unspecified osteoarthritis, unspecified site: Secondary | ICD-10-CM | POA: Insufficient documentation

## 2012-07-20 DIAGNOSIS — K648 Other hemorrhoids: Secondary | ICD-10-CM

## 2012-07-20 DIAGNOSIS — R609 Edema, unspecified: Secondary | ICD-10-CM | POA: Insufficient documentation

## 2012-07-20 DIAGNOSIS — D126 Benign neoplasm of colon, unspecified: Secondary | ICD-10-CM

## 2012-07-20 DIAGNOSIS — H269 Unspecified cataract: Secondary | ICD-10-CM

## 2012-07-20 DIAGNOSIS — F209 Schizophrenia, unspecified: Secondary | ICD-10-CM

## 2012-07-20 DIAGNOSIS — G541 Lumbosacral plexus disorders: Secondary | ICD-10-CM

## 2012-07-20 DIAGNOSIS — I10 Essential (primary) hypertension: Secondary | ICD-10-CM

## 2012-07-20 DIAGNOSIS — F329 Major depressive disorder, single episode, unspecified: Secondary | ICD-10-CM | POA: Insufficient documentation

## 2012-07-20 DIAGNOSIS — R600 Localized edema: Secondary | ICD-10-CM | POA: Insufficient documentation

## 2012-07-20 DIAGNOSIS — E785 Hyperlipidemia, unspecified: Secondary | ICD-10-CM

## 2012-07-20 DIAGNOSIS — K760 Fatty (change of) liver, not elsewhere classified: Secondary | ICD-10-CM

## 2012-07-20 NOTE — Progress Notes (Signed)
History of Present Illness:  Ricky [name] is a 72 year old right-handed Caucasian male, resident of [facility], accompanied by his OT Elease Hashimoto and care giver at today's clinical visit.  He has past medical history of schizophrenia,has been treated with different medication over the years, currently stabilized on Zyprexa 20 mg every day, he also has diabetes type 2 for since 2004, hypertension.  He presenting with five-month history of gait difficulty in 2011, he moved to current group home 5 years ago, he was able to ambulate, doing his own grocery shopping until about 5 months ago.   he began to notice left anterior thigh pain, over a few days course, he developed significant left leg weakness, his left knee buckle underneath him, he become nonambulatory.  He was able to move his wheelchair, with bilateral leg flexion extension but has difficulty bearing weight with his left leg, he has obesity too.  He denied bilateral lower extremity paresthesia, urinary incontinence, bilateral upper extremity weakness, but I have saw moderate atrophy office bilateral intrinsic hand muscles. he tends to rest his arm on arm chair.  he denies dysarthria, dysphagia, double vision.  Nerve conduction study, has demonstrated absent bilateral lower extremity sensory responeses,  he has low amplttude bilateral lower extremity motor responses, but normal conduction velocity.  absent bilateral tibial H. reflexes. absent bilateral median and ulnar sensory responses.   Left median and ulnar motor responses had demonstrated decreased C. map amplitude, prolonged distal latency, F wave latency, but normal conduction velocity.  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 there also active neuropathic process involving left first dorsal interossei, and slightly left abductor pollicis brevis, consistent with left  median, and ulnar neuropathy  he had MRI cervical and lumbar. MRI cervical: mulitple level DJD, no cord compression. MRI Lumbar showed marked degenerative changes at L4-5 with mild foraminal stenosis on left. He is not in pain, receiving PT/OT, made nice progress, able to bearing weight with his left leg.   UPDATE June 30th 2014:  Last clinical visit was in January 2013, there was no significant change, he continued to stroke his wheelchair, able to bearing weight, but not able to ambulate, He denies significant pain, no paresthesia, has urinary urgency  Physical Exam  Cardiovascular: regular rate and rhythm  Neurologic Exam  Mental Status: pleasant, awake, alert, cooperative to history, talking, and casual conversation, deliberate talking. Cranial Nerves: CN II-XII pupils were equal round reactive to light.  Extraocular movements were full.  Visual fields were full on confrontational testl  Facial sensation and strength were normal.  Hearing was intact to finger rubbing bilaterally.  Uvula tongue were midling.  Head turning and shoulder shrugging were normal and symmetric.  Tongue protrusion into the cheeks strength were normal. Motor: modereate bilateral intrinsic muscle atrophy, mild to moderate  finger abduction weakness, left anterior thigh significant atrophy, hip flexion 5/5-, knee flexion 5/5, knee adduction 5/5, knee abduction 5/5, knee extension 4+/3, ankle dorsiflexion 5/5, plantar flexion 5/5. Sensory: Normal to light touch,vibration and pin prick. Coordination:  There was no dysmetria noticed. Gait and Station: deferred Reflexes: Deep tendon reflexes: Bicepts: 1/1, Brachioradialis: 1/1, Triceps:1/1  Patellar: 1/absent   Assessment and Plan: 72 old diabetic patient, presenting with left leg weakness, mainly involving left leg extension  preceding by deep left leg pain, the history and electrodiagnostic study are most consistent with a diabetic amyotrophy, mainly involving left L3-L4  myotomes. He has been  clinically stable since 2011.  Continue weightbearing exercise, return to clinic as needed

## 2012-08-21 ENCOUNTER — Encounter: Payer: Self-pay | Admitting: Neurology

## 2012-08-21 ENCOUNTER — Telehealth: Payer: Self-pay | Admitting: Neurology

## 2012-08-21 NOTE — Telephone Encounter (Signed)
Gwen from [employer] left message that she didn't receive signed order for wheelchair repairs.  I spoke to [facility] and told her one was faxed from May, she said this is a new order for new tires.  I don't have new prescription for tires, so she will fax new order.

## 2012-08-21 NOTE — Telephone Encounter (Signed)
This encounter was created in error - please disregard.

## 2012-10-14 ENCOUNTER — Encounter: Payer: Self-pay | Admitting: Cardiovascular Disease

## 2012-10-14 ENCOUNTER — Ambulatory Visit (INDEPENDENT_AMBULATORY_CARE_PROVIDER_SITE_OTHER): Payer: Medicare Other | Admitting: Cardiovascular Disease

## 2012-10-14 VITALS — BP 118/90 | HR 91 | Resp 16 | Ht 69.5 in

## 2012-10-14 DIAGNOSIS — R011 Cardiac murmur, unspecified: Secondary | ICD-10-CM

## 2012-10-14 DIAGNOSIS — I440 Atrioventricular block, first degree: Secondary | ICD-10-CM

## 2012-10-14 DIAGNOSIS — M6258 Muscle wasting and atrophy, not elsewhere classified, other site: Secondary | ICD-10-CM

## 2012-10-14 DIAGNOSIS — M6250 Muscle wasting and atrophy, not elsewhere classified, unspecified site: Secondary | ICD-10-CM

## 2012-10-14 DIAGNOSIS — R000 Tachycardia, unspecified: Secondary | ICD-10-CM

## 2012-10-14 NOTE — Patient Instructions (Addendum)
Your physician has requested that you have an echocardiogram. Echocardiography is a painless test that uses sound waves to create images of your heart. It provides your doctor with information about the size and shape of your heart and how well your heart's chambers and valves are working. This procedure takes approximately one hour. There are no restrictions for this procedure.  We will call you with the results.  Your physician recommends that you schedule a follow-up appointment in: As Needed.  

## 2012-10-20 ENCOUNTER — Encounter: Payer: Self-pay | Admitting: Cardiovascular Disease

## 2012-10-20 DIAGNOSIS — I440 Atrioventricular block, first degree: Secondary | ICD-10-CM | POA: Insufficient documentation

## 2012-10-20 DIAGNOSIS — M6258 Muscle wasting and atrophy, not elsewhere classified, other site: Secondary | ICD-10-CM | POA: Insufficient documentation

## 2012-10-20 NOTE — Progress Notes (Signed)
Patient ID: Ricky Carne., male   DOB: 1940-05-18, 72 y.o.   MRN: [id number]     Reason for office visit Tachycardia  [name] is a resident at [facility]. His physical therapist told him that his heart rate was irregular. His primary care physician performed an echocardiogram that showed sinus rhythm at the upper limit of normal range.  He does not have a history of structural heart disease but does have hypertension and diabetes mellitus on multiple pharmacological agents. He has a history of schizophrenia on neuroleptic medications. Most strikingly he has a poorly defined muscle weakness disorder. He has been unable to walk for several years. He also has evidence of extensive muscle atrophy involving both his lower extremity and the intrinsic muscles of his hands. 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  He has been in a wheelchair for the last 3 years.  There is no family history of muscular wasting diseases or neurodegenerative diseases.  He denies problems with respiratory difficulty, chest pain, palpitations or dyspnea.    Allergies  Allergen Reactions  . Asa [Aspirin]     "Dr told me not to take it"  . Codeine Other (See Comments)    DIZZINESS with Tylenol 3  . Tylenol [Acetaminophen] Other (See Comments)    Dizziness with tylenol 3    Current Outpatient Prescriptions  Medication Sig Dispense Refill  . ALPRAZolam (XANAX) 0.5 MG tablet Take 1 mg by mouth every evening.       . celecoxib (CELEBREX) 200 MG capsule Take 200 mg by mouth 2 (two) times daily.      . fluvoxaMINE (LUVOX) 100 MG tablet Take 100 mg by mouth at bedtime.       . furosemide (LASIX) 20 MG tablet Take 20 mg by mouth daily before breakfast.       . glimepiride (AMARYL) 2 MG tablet Take 2 mg by mouth daily before breakfast.      . hydrOXYzine (ATARAX/VISTARIL) 25 MG tablet Take 25 mg by mouth at bedtime.      . hydrOXYzine (VISTARIL) 25 MG capsule Take 25 mg by mouth  daily.      . Moexipril-Hydrochlorothiazide 15-12.5 MG TABS Take 1 tablet by mouth daily after lunch.       . OLANZapine (ZYPREXA) 20 MG tablet Take 20 mg by mouth at bedtime.      . ONE TOUCH ULTRA TEST test strip       . ONGLYZA 5 MG TABS tablet Take 5 mg by mouth daily before breakfast.       . pravastatin (PRAVACHOL) 40 MG tablet Take 40 mg by mouth daily after lunch.       Marland Kitchen amLODipine (NORVASC) 5 MG tablet Take 5 mg by mouth daily.      . metFORMIN (GLUCOPHAGE-XR) 500 MG 24 hr tablet Take 500 mg by mouth 2 (two) times daily.      Marland Kitchen omeprazole (PRILOSEC) 40 MG capsule Take 40 mg by mouth daily.      Marland Kitchen SSD 1 % cream as needed.       No current facility-administered medications for this visit.    Past Medical History  Diagnosis Date  . Diabetes mellitus without complication   . Hypertension   . Dementia   . Depression   . Arthritis   . Hyperlipidemia   . Cataract   . Urinary frequency   . Fatty liver   . Schizophrenia   . Peripheral edema   .  Internal hemorrhoids   . Adenomatous colon polyp   . Type II or unspecified type diabetes mellitus without mention of complication, not stated as uncontrolled   . Abnormality of gait   . Other malaise and fatigue     Past Surgical History  Procedure Laterality Date  . Tonsillectomy    . Esophagogastroduodenoscopy  02/21/2012    Procedure: ESOPHAGOGASTRODUODENOSCOPY (EGD);  Surgeon: Theda Belfast, MD;  Location: [facility];  Service: Endoscopy;  Laterality: N/A;  . Esophagogastroduodenoscopy N/A 05/22/2012    Procedure: ESOPHAGOGASTRODUODENOSCOPY (EGD);  Surgeon: Theda Belfast, MD;  Location: [facility];  Service: Endoscopy;  Laterality: N/A;  . Balloon dilation N/A 05/22/2012    Procedure: BALLOON DILATION;  Surgeon: Theda Belfast, MD;  Location: [facility];  Service: Endoscopy;  Laterality: N/A;    Family History  Problem Relation Age of Onset  . Brain cancer Father     History   Social History  . Marital Status:  Single    Spouse Name: N/A    Number of Children: N/A  . Years of Education: college   Occupational History  .      Disabled   Social History Main Topics  . Smoking status: Never Smoker   . Smokeless tobacco: Never Used  . Alcohol Use: No  . Drug Use: No  . Sexual Activity: Not on file   Other Topics Concern  . Not on file   Social History Narrative   Patient is disabled and he has not worked since 1962. Patient has some college education.Patient drinks 2 cups of coffee daily.   Right handed.    Review of systems:  He is able to transfer from chair to bedside commode but does not perform any other real physical exertion. He has slurred speech as a chronic problem The patient specifically denies any chest pain at rest or with exertion, dyspnea at rest or with exertion, orthopnea, paroxysmal nocturnal dyspnea, syncope, palpitations, focal neurological deficits, intermittent claudication, lower extremity edema, unexplained weight gain, cough, hemoptysis or wheezing.  The patient also denies abdominal pain, nausea, vomiting, dysphagia, diarrhea, constipation, polyuria, polydipsia, dysuria, hematuria, frequency, urgency, abnormal bleeding or bruising, fever, chills, unexpected weight changes, mood swings, change in skin or hair texture, change in voice quality, auditory or visual problems, allergic reactions or rashes, new musculoskeletal complaints other than usual "aches and pains".   PHYSICAL EXAM BP 118/90  Pulse 91  Resp 16  Ht 5' 9.5" (1.765 m)  General: Alert, oriented x3, no distress, appears somewhat disheveled Head: no evidence of trauma, PERRL, EOMI, no exophtalmos or lid lag, no myxedema, no xanthelasma; normal ears, nose and oropharynx Neck: normal jugular venous pulsations and no hepatojugular reflux; brisk carotid pulses without delay and no carotid bruits Chest: clear to auscultation, no signs of consolidation by percussion or palpation, normal fremitus,  symmetrical and full respiratory excursions Cardiovascular: normal position and quality of the apical impulse, regular rhythm, normal first and second heart sounds, no  rubs or gallops, 2/6 systolic murmur heard best at the apex and left lower sternal border probably holosystolic Abdomen: no tenderness or distention, no masses by palpation, no abnormal pulsatility or arterial bruits, normal bowel sounds, no hepatosplenomegaly Extremities: no clubbing, cyanosis or edema; 2+ radial, ulnar and brachial pulses bilaterally; 2+ right femoral, posterior tibial and dorsalis pedis pulses; 2+ left femoral, posterior tibial and dorsalis pedis pulses; no subclavian or femoral bruits. There is strikingly severe atrophy of the intrinsic muscles of his hands and upper  and lower extremity girdle muscles Neurological: grossly nonfocal   EKG: Sinus rhythm with first degree AV block, nonspecific inferior Q waves, poor R wave progression across the anterior precordium. No acute repolarization abnormalities.  Lipid Panel  No results found for this basename: chol, trig, hdl, cholhdl, vldl, ldlcalc    BMET No results found for this basename: na, k, cl, co2, glucose, bun, creatinine, calcium, gfrnonaa, gfraa     ASSESSMENT AND PLAN  Mr. Vetrano does not appear to have a true arrhythmia but does have unusually fast heart rate for a person at rest. He also has first degree AV block.  The tachycardia may be a reflection of significant diabetes related dysautonomia. On the other hand I am quite impressed by his muscle atrophy and wonder whether he could have a skeletal myopathy, that in turn might have myocardial involvement with resting tachycardia as an expression of congestive heart failure. I recommended that he have an echocardiogram. This also will help evaluate his murmur.  If the echocardiogram does not show meaningful structural abnormalities I do not think she will require additional cardiology followup. If  however left ventricular systolic function is abnormal or he has significant valvular problems I will schedule a followup appointment   Orders Placed This Encounter  Procedures  . EKG 12-Lead  . 2D Echocardiogram without contrast   Meds ordered this encounter  Medications  . amLODipine (NORVASC) 5 MG tablet    Sig: Take 5 mg by mouth daily.  . metFORMIN (GLUCOPHAGE-XR) 500 MG 24 hr tablet    Sig: Take 500 mg by mouth 2 (two) times daily.  Marland Kitchen omeprazole (PRILOSEC) 40 MG capsule    Sig: Take 40 mg by mouth daily.  Marland Kitchen SSD 1 % cream    Sig: as needed.    Junious Silk, MD, [facility] [phone] office [phone] pager

## 2012-10-27 ENCOUNTER — Encounter: Payer: Self-pay | Admitting: Cardiovascular Disease

## 2012-10-28 ENCOUNTER — Ambulatory Visit (HOSPITAL_COMMUNITY)
Admission: RE | Admit: 2012-10-28 | Discharge: 2012-10-28 | Disposition: A | Discharge status: Home / Self Care | Payer: Medicare Other | Source: Ambulatory Visit | Attending: Cardiovascular Disease | Admitting: Cardiovascular Disease

## 2012-10-28 DIAGNOSIS — R000 Tachycardia, unspecified: Secondary | ICD-10-CM | POA: Insufficient documentation

## 2012-10-28 DIAGNOSIS — R011 Cardiac murmur, unspecified: Secondary | ICD-10-CM | POA: Insufficient documentation

## 2012-10-28 NOTE — Progress Notes (Signed)
2D Echo Performed 10/28/2012    Cintya Daughety, RCS  

## 2012-12-10 ENCOUNTER — Encounter (HOSPITAL_COMMUNITY): Payer: Self-pay | Admitting: Emergency Medicine

## 2012-12-10 ENCOUNTER — Emergency Department (HOSPITAL_COMMUNITY): Payer: Medicare Other

## 2012-12-10 ENCOUNTER — Inpatient Hospital Stay (HOSPITAL_COMMUNITY)
Admission: EM | Admit: 2012-12-10 | Discharge: 2012-12-14 | DRG: 558 | Disposition: A | Discharge status: Skilled Nursing Facility | Payer: Medicare Other | Attending: Internal Medicine | Admitting: Internal Medicine

## 2012-12-10 DIAGNOSIS — R269 Unspecified abnormalities of gait and mobility: Secondary | ICD-10-CM

## 2012-12-10 DIAGNOSIS — W19XXXA Unspecified fall, initial encounter: Secondary | ICD-10-CM

## 2012-12-10 DIAGNOSIS — R531 Weakness: Secondary | ICD-10-CM

## 2012-12-10 DIAGNOSIS — E1149 Type 2 diabetes mellitus with other diabetic neurological complication: Secondary | ICD-10-CM

## 2012-12-10 DIAGNOSIS — F0390 Unspecified dementia without behavioral disturbance: Secondary | ICD-10-CM | POA: Diagnosis present

## 2012-12-10 DIAGNOSIS — R5381 Other malaise: Secondary | ICD-10-CM

## 2012-12-10 DIAGNOSIS — D72829 Elevated white blood cell count, unspecified: Secondary | ICD-10-CM | POA: Diagnosis present

## 2012-12-10 DIAGNOSIS — R471 Dysarthria and anarthria: Secondary | ICD-10-CM | POA: Diagnosis present

## 2012-12-10 DIAGNOSIS — I10 Essential (primary) hypertension: Secondary | ICD-10-CM

## 2012-12-10 DIAGNOSIS — E785 Hyperlipidemia, unspecified: Secondary | ICD-10-CM | POA: Diagnosis present

## 2012-12-10 DIAGNOSIS — E872 Acidosis, unspecified: Secondary | ICD-10-CM | POA: Diagnosis present

## 2012-12-10 DIAGNOSIS — R1310 Dysphagia, unspecified: Secondary | ICD-10-CM | POA: Diagnosis present

## 2012-12-10 DIAGNOSIS — R748 Abnormal levels of other serum enzymes: Secondary | ICD-10-CM

## 2012-12-10 DIAGNOSIS — R296 Repeated falls: Secondary | ICD-10-CM

## 2012-12-10 DIAGNOSIS — W19XXXD Unspecified fall, subsequent encounter: Secondary | ICD-10-CM

## 2012-12-10 DIAGNOSIS — R739 Hyperglycemia, unspecified: Secondary | ICD-10-CM

## 2012-12-10 DIAGNOSIS — Z66 Do not resuscitate: Secondary | ICD-10-CM | POA: Diagnosis present

## 2012-12-10 DIAGNOSIS — R339 Retention of urine, unspecified: Secondary | ICD-10-CM | POA: Diagnosis present

## 2012-12-10 DIAGNOSIS — E119 Type 2 diabetes mellitus without complications: Secondary | ICD-10-CM

## 2012-12-10 DIAGNOSIS — T148XXA Other injury of unspecified body region, initial encounter: Secondary | ICD-10-CM

## 2012-12-10 DIAGNOSIS — E860 Dehydration: Secondary | ICD-10-CM | POA: Diagnosis present

## 2012-12-10 DIAGNOSIS — M6282 Rhabdomyolysis: Principal | ICD-10-CM | POA: Diagnosis present

## 2012-12-10 DIAGNOSIS — K7689 Other specified diseases of liver: Secondary | ICD-10-CM | POA: Diagnosis present

## 2012-12-10 DIAGNOSIS — Z23 Encounter for immunization: Secondary | ICD-10-CM

## 2012-12-10 DIAGNOSIS — F209 Schizophrenia, unspecified: Secondary | ICD-10-CM | POA: Diagnosis present

## 2012-12-10 LAB — CBC WITH DIFFERENTIAL/PLATELET
Basophils Relative: 0 % (ref 0–1)
Eosinophils Absolute: 0 10*3/uL (ref 0.0–0.7)
Eosinophils Relative: 0 % (ref 0–5)
Hemoglobin: 13.5 g/dL (ref 13.0–17.0)
MCH: 28.8 pg (ref 26.0–34.0)
MCHC: 33.3 g/dL (ref 30.0–36.0)
Monocytes Relative: 10 % (ref 3–12)
Neutro Abs: 9.4 10*3/uL — ABNORMAL HIGH (ref 1.7–7.7)
Neutrophils Relative %: 78 % — ABNORMAL HIGH (ref 43–77)
RDW: 13.2 % (ref 11.5–15.5)

## 2012-12-10 LAB — POCT I-STAT, CHEM 8
BUN: 20 mg/dL (ref 6–23)
Calcium, Ion: 1.23 mmol/L (ref 1.13–1.30)
Chloride: 98 mEq/L (ref 96–112)
Creatinine, Ser: 1.4 mg/dL — ABNORMAL HIGH (ref 0.50–1.35)
Glucose, Bld: 306 mg/dL — ABNORMAL HIGH (ref 70–99)
HCT: 45 % (ref 39.0–52.0)
Potassium: 4.2 mEq/L (ref 3.5–5.1)

## 2012-12-10 LAB — CBC
HCT: 43.2 % (ref 39.0–52.0)
Hemoglobin: 14.7 g/dL (ref 13.0–17.0)
MCH: 30 pg (ref 26.0–34.0)
MCHC: 34 g/dL (ref 30.0–36.0)
MCV: 88.2 fL (ref 78.0–100.0)
Platelets: 192 10*3/uL (ref 150–400)
RBC: 4.9 MIL/uL (ref 4.22–5.81)
WBC: 10.7 10*3/uL — ABNORMAL HIGH (ref 4.0–10.5)

## 2012-12-10 LAB — CREATININE, SERUM: GFR calc Af Amer: >90 mL/min (ref >90)

## 2012-12-10 LAB — GLUCOSE, CAPILLARY
Glucose-Capillary: 339 mg/dL — ABNORMAL HIGH (ref 70–99)
Glucose-Capillary: 229 mg/dL — ABNORMAL HIGH (ref 70–99)

## 2012-12-10 LAB — URINE MICROSCOPIC-ADD ON

## 2012-12-10 LAB — URINALYSIS, ROUTINE W REFLEX MICROSCOPIC
Bilirubin Urine: NEGATIVE
Ketones, ur: 40 mg/dL — AB
Nitrite: NEGATIVE
Urobilinogen, UA: 0.2 mg/dL (ref 0.0–1.0)

## 2012-12-10 LAB — POCT I-STAT TROPONIN I: Troponin i, poc: 0.01 ng/mL (ref 0.00–0.08)

## 2012-12-10 LAB — MRSA PCR SCREENING: MRSA by PCR: NEGATIVE

## 2012-12-10 MED ORDER — SODIUM CHLORIDE 0.9 % IV SOLN: INTRAVENOUS | Status: DC

## 2012-12-10 MED ORDER — HEPARIN SODIUM (PORCINE) 5000 UNIT/ML IJ SOLN
5000.0000 [IU] | Freq: Three times a day (TID) | INTRAMUSCULAR | Status: DC
  Administered 2012-12-10 – 2012-12-14 (×12): 5000 [IU] via SUBCUTANEOUS
  Filled 2012-12-10 (×14): qty 1

## 2012-12-10 MED ORDER — KETOCONAZOLE 2 % EX CREA
1.0000 "application " | TOPICAL_CREAM | Freq: Two times a day (BID) | CUTANEOUS | Status: DC
  Administered 2012-12-10 – 2012-12-14 (×8): 1 via TOPICAL
  Filled 2012-12-10: qty 15

## 2012-12-10 MED ORDER — SODIUM CHLORIDE 0.9 % IV BOLUS (SEPSIS)
1000.0000 mL | Freq: Once | INTRAVENOUS | Status: AC
  Administered 2012-12-10: 1000 mL via INTRAVENOUS

## 2012-12-10 MED ORDER — ONDANSETRON HCL 4 MG/2ML IJ SOLN: 4.0000 mg | Freq: Four times a day (QID) | INTRAMUSCULAR | Status: DC | PRN

## 2012-12-10 MED ORDER — SODIUM CHLORIDE 0.9 % IV SOLN: INTRAVENOUS | Status: AC

## 2012-12-10 MED ORDER — ALUM & MAG HYDROXIDE-SIMETH 200-200-20 MG/5ML PO SUSP: 30.0000 mL | Freq: Four times a day (QID) | ORAL | Status: DC | PRN

## 2012-12-10 MED ORDER — SODIUM CHLORIDE 0.9 % IV SOLN
INTRAVENOUS | Status: AC
  Administered 2012-12-10: 16:00:00 via INTRAVENOUS

## 2012-12-10 MED ORDER — OMEGA-3-ACID ETHYL ESTERS 1 G PO CAPS
2.0000 g | ORAL_CAPSULE | Freq: Every day | ORAL | Status: DC
  Administered 2012-12-11 – 2012-12-14 (×4): 2 g via ORAL
  Filled 2012-12-10 (×4): qty 2

## 2012-12-10 MED ORDER — ONDANSETRON HCL 4 MG PO TABS: 4.0000 mg | ORAL_TABLET | Freq: Four times a day (QID) | ORAL | Status: DC | PRN

## 2012-12-10 MED ORDER — OLANZAPINE 10 MG PO TABS
20.0000 mg | ORAL_TABLET | Freq: Every day | ORAL | Status: DC
  Administered 2012-12-10 – 2012-12-13 (×4): 20 mg via ORAL
  Filled 2012-12-10 (×5): qty 2

## 2012-12-10 MED ORDER — BIOTENE DRY MOUTH MT LIQD
15.0000 mL | Freq: Two times a day (BID) | OROMUCOSAL | Status: DC
  Administered 2012-12-10 – 2012-12-14 (×8): 15 mL via OROMUCOSAL

## 2012-12-10 MED ORDER — INSULIN GLARGINE 100 UNIT/ML ~~LOC~~ SOLN
5.0000 [IU] | Freq: Every day | SUBCUTANEOUS | Status: DC
  Administered 2012-12-10 – 2012-12-11 (×2): 5 [IU] via SUBCUTANEOUS
  Filled 2012-12-10 (×2): qty 0.05

## 2012-12-10 MED ORDER — FLUVOXAMINE MALEATE 100 MG PO TABS
100.0000 mg | ORAL_TABLET | Freq: Every day | ORAL | Status: DC
  Administered 2012-12-10 – 2012-12-13 (×4): 100 mg via ORAL
  Filled 2012-12-10 (×5): qty 1

## 2012-12-10 MED ORDER — SIMVASTATIN 20 MG PO TABS
20.0000 mg | ORAL_TABLET | Freq: Every day | ORAL | Status: DC
  Administered 2012-12-10 – 2012-12-14 (×5): 20 mg via ORAL
  Filled 2012-12-10 (×5): qty 1

## 2012-12-10 MED ORDER — OMEGA 3 1200 MG PO CAPS: 2.0000 | ORAL_CAPSULE | Freq: Every day | ORAL | Status: DC

## 2012-12-10 MED ORDER — INSULIN ASPART 100 UNIT/ML ~~LOC~~ SOLN
0.0000 [IU] | Freq: Three times a day (TID) | SUBCUTANEOUS | Status: DC
  Administered 2012-12-10: 3 [IU] via SUBCUTANEOUS
  Administered 2012-12-11: 5 [IU] via SUBCUTANEOUS
  Administered 2012-12-11: 7 [IU] via SUBCUTANEOUS
  Administered 2012-12-11: 5 [IU] via SUBCUTANEOUS
  Administered 2012-12-12: 9 [IU] via SUBCUTANEOUS
  Administered 2012-12-12 (×2): 5 [IU] via SUBCUTANEOUS
  Administered 2012-12-13: 3 [IU] via SUBCUTANEOUS
  Administered 2012-12-13: 5 [IU] via SUBCUTANEOUS
  Administered 2012-12-13 – 2012-12-14 (×3): 7 [IU] via SUBCUTANEOUS
  Administered 2012-12-14: 3 [IU] via SUBCUTANEOUS

## 2012-12-10 MED ORDER — HYDROXYZINE PAMOATE 25 MG PO CAPS
25.0000 mg | ORAL_CAPSULE | Freq: Every evening | ORAL | Status: DC | PRN
  Filled 2012-12-10: qty 2

## 2012-12-10 MED ORDER — TAMSULOSIN HCL 0.4 MG PO CAPS
0.4000 mg | ORAL_CAPSULE | Freq: Every day | ORAL | Status: DC
  Administered 2012-12-10 – 2012-12-14 (×5): 0.4 mg via ORAL
  Filled 2012-12-10 (×5): qty 1

## 2012-12-10 MED ORDER — CELECOXIB 200 MG PO CAPS
200.0000 mg | ORAL_CAPSULE | Freq: Two times a day (BID) | ORAL | Status: DC
  Filled 2012-12-10 (×2): qty 1

## 2012-12-10 MED ORDER — ALPRAZOLAM 0.5 MG PO TABS
1.0000 mg | ORAL_TABLET | Freq: Every evening | ORAL | Status: DC
  Administered 2012-12-10 – 2012-12-14 (×5): 1 mg via ORAL
  Filled 2012-12-10 (×5): qty 2

## 2012-12-10 MED ORDER — DOCUSATE SODIUM 100 MG PO CAPS
100.0000 mg | ORAL_CAPSULE | Freq: Two times a day (BID) | ORAL | Status: DC
  Administered 2012-12-10 – 2012-12-14 (×8): 100 mg via ORAL
  Filled 2012-12-10 (×9): qty 1

## 2012-12-10 NOTE — ED Provider Notes (Signed)
CSN: [id number]     Arrival date & time 12/10/12  1046 History   First MD Initiated Contact with Patient 12/10/12 1055     Chief Complaint  Patient presents with  . Fall   (Consider location/radiation/quality/duration/timing/severity/associated sxs/prior Treatment) HPI  72 year old male with history of non-insulin-dependent diabetes, dementia, schizophrenia, hypertension presents ER for evaluation of generalized weakness. History obtained through patient and caregiver who is at bedside. Patient reports he was using his commode to urinate earlier this morning, lost balance, fell down to the ground and unable to get up. Unsure if he has had any loss of consciousness, but does not thinks he injured his head.  Patient states he was on the ground for 4-5 hours and was trying to crawl around to reach for help but unable to. His caregiver who normally sees him daily from 9 AM to 12 PM came by today and found patient laying on the carpet.  Caregiver states that for the past 8 days patient has had a decline in his health. Normally the patient is active, dressed himself and usually radiates to go out by the time care giver come around 9 AM each morning.  However, he is exhibiting increased weakness, less active and not behaving normally.  Pt sts he has been taking his medication, and has been drinking more tomato juice than normal "because i like tomato juice".  He report having difficulty urinating, but sts this is normal.  Pt not on blood thinner medication.   Past Medical History  Diagnosis Date  . Diabetes mellitus without complication   . Hypertension   . Dementia   . Depression   . Arthritis   . Hyperlipidemia   . Cataract   . Urinary frequency   . Fatty liver   . Schizophrenia   . Peripheral edema   . Internal hemorrhoids   . Adenomatous colon polyp   . Type II or unspecified type diabetes mellitus without mention of complication, not stated as uncontrolled   . Abnormality of gait   .  Other malaise and fatigue    Past Surgical History  Procedure Laterality Date  . Tonsillectomy    . Esophagogastroduodenoscopy  02/21/2012    Procedure: ESOPHAGOGASTRODUODENOSCOPY (EGD);  Surgeon: Theda Belfast, MD;  Location: [facility];  Service: Endoscopy;  Laterality: N/A;  . Esophagogastroduodenoscopy N/A 05/22/2012    Procedure: ESOPHAGOGASTRODUODENOSCOPY (EGD);  Surgeon: Theda Belfast, MD;  Location: [facility];  Service: Endoscopy;  Laterality: N/A;  . Balloon dilation N/A 05/22/2012    Procedure: BALLOON DILATION;  Surgeon: Theda Belfast, MD;  Location: [facility];  Service: Endoscopy;  Laterality: N/A;   Family History  Problem Relation Age of Onset  . Brain cancer Father    History  Substance Use Topics  . Smoking status: Never Smoker   . Smokeless tobacco: Never Used  . Alcohol Use: No    Review of Systems  All other systems reviewed and are negative.    Allergies  Asa; Codeine; and Tylenol  Home Medications   Current Outpatient Rx  Name  Route  Sig  Dispense  Refill  . ALPRAZolam (XANAX) 0.5 MG tablet   Oral   Take 1 mg by mouth every evening.          Marland Kitchen amLODipine (NORVASC) 5 MG tablet   Oral   Take 5 mg by mouth daily.         . Calcium Citrate-Vitamin D (CITRACAL + D PO)   Oral  Take 1 tablet by mouth daily.         . celecoxib (CELEBREX) 200 MG capsule   Oral   Take 200 mg by mouth 2 (two) times daily.         . cephALEXin (KEFLEX) 500 MG capsule   Oral   Take 500 mg by mouth 2 (two) times daily.         . fluvoxaMINE (LUVOX) 100 MG tablet   Oral   Take 100 mg by mouth at bedtime.          . furosemide (LASIX) 20 MG tablet   Oral   Take 20 mg by mouth daily before breakfast.          . glimepiride (AMARYL) 2 MG tablet   Oral   Take 2 mg by mouth daily before breakfast.         . GLUCOSAMINE-CHONDROITIN PO   Oral   Take 1 tablet by mouth daily.         . hydrOXYzine (VISTARIL) 25 MG capsule   Oral   Take  25 mg by mouth daily.         . hydrOXYzine (VISTARIL) 25 MG capsule   Oral   Take 25-50 mg by mouth at bedtime as needed (for sleep).         Marland Kitchen ketoconazole (NIZORAL) 2 % cream   Topical   Apply 1 application topically 2 (two) times daily.         . metFORMIN (GLUCOPHAGE-XR) 500 MG 24 hr tablet   Oral   Take 500 mg by mouth 2 (two) times daily.         . Moexipril-Hydrochlorothiazide 15-12.5 MG TABS   Oral   Take 1 tablet by mouth daily after lunch.          . OLANZapine (ZYPREXA) 20 MG tablet   Oral   Take 20 mg by mouth at bedtime.         . Omega 3 1200 MG CAPS   Oral   Take 2 capsules by mouth daily.         Marland Kitchen omeprazole (PRILOSEC) 40 MG capsule   Oral   Take 40 mg by mouth daily.         . ONE TOUCH ULTRA TEST test strip               . ONGLYZA 5 MG TABS tablet   Oral   Take 5 mg by mouth daily before breakfast.          . pravastatin (PRAVACHOL) 40 MG tablet   Oral   Take 40 mg by mouth daily after lunch.          . SSD 1 % cream   Topical   Apply 1 application topically 3 (three) times daily.           BP 118/73  Pulse 108  Temp(Src) 97.7 F (36.5 C) (Oral)  Resp 20  SpO2 93% Physical Exam  Constitutional: He is oriented to person, place, and time. He appears well-developed and well-nourished. No distress.  Pt appears lethargic  HENT:  Head: Normocephalic and atraumatic.  Lips are dry, tongue are dry  Eyes: Conjunctivae are normal.  Neck: Normal range of motion. Neck supple.  Neurological: He is alert and oriented to person, place, and time.  Speech is garble   No facial droops, no unilateral weakness  4/5 strength to all 4 extremities  Skin: No rash noted.  Abrasions noted to forehead, bilateral palms of hands, bilateral knees, and lower extremities is erythematous, with blanchable erythema.   Intact distal pulses  Psychiatric: He has a normal mood and affect.    ED Course  Procedures (including critical care  time)  Pt with generalized weakness, evidence of dehydration, also has been lying on the floor for 4-5 hrs concerning for rhabdo.  He is difficult to understand however is alert and oriented x3, without evidence suggestive of stroke.  Questionable source of infection causing his sxs.  Does have hx of diabetes, and has elevated CBG of 339 today.  Admits to drinking a lot of tomato juice.  Pt too weak to ambulate.  IVF given, work up initiated, care discussed with attending.    2:08 PM Pt will benefit from admission for management of dehydration as evident of Ketone on UA, Elevated lactic acid, renal insufficiency with Cr. 1.4, and elevated CK of 1965 which signify mild rhabdo.  No obvious source of infection identified.  CBG elevated at 339 without anion gap to suggest DKA.    Labs Review Labs Reviewed  GLUCOSE, CAPILLARY - Abnormal; Notable for the following:    Glucose-Capillary 339 (*)    All other components within normal limits  URINALYSIS, ROUTINE W REFLEX MICROSCOPIC - Abnormal; Notable for the following:    Glucose, UA >1000 (*)    Hgb urine dipstick SMALL (*)    Ketones, ur 40 (*)    All other components within normal limits  CK - Abnormal; Notable for the following:    Total CK 1965 (*)    All other components within normal limits  CBC WITH DIFFERENTIAL - Abnormal; Notable for the following:    WBC 12.0 (*)    Neutrophils Relative % 78 (*)    Neutro Abs 9.4 (*)    Lymphocytes Relative 11 (*)    Monocytes Absolute 1.2 (*)    All other components within normal limits  CG4 I-STAT (LACTIC ACID) - Abnormal; Notable for the following:    Lactic Acid, Venous 2.57 (*)    All other components within normal limits  POCT I-STAT, CHEM 8 - Abnormal; Notable for the following:    Creatinine, Ser 1.40 (*)    Glucose, Bld 306 (*)    All other components within normal limits  URINE MICROSCOPIC-ADD ON  POCT I-STAT TROPONIN I   Imaging Review Dg Chest 2 View  12/10/2012   CLINICAL DATA:   Loss of consciousness.  EXAM: CHEST  2 VIEW  COMPARISON:  11/07/2011.  FINDINGS: Mediastinum hilar structures normal. Lungs are clear. Cardiomegaly, no CHF. No pleural effusion or pneumothorax. Degenerative changes thoracic spine.  IMPRESSION: No active cardiopulmonary disease.   Electronically Signed   By: Maisie Fus  Register   On: 12/10/2012 12:35   Ct Head Wo Contrast  12/10/2012   CLINICAL DATA:  Fall.  EXAM: CT HEAD WITHOUT CONTRAST  TECHNIQUE: Contiguous axial images were obtained from the base of the skull through the vertex without intravenous contrast.  COMPARISON:  None.  FINDINGS: Ventricle size is normal. Age-appropriate atrophy. Negative for acute infarct. Negative for hemorrhage or mass. No skull fracture.  IMPRESSION: No acute abnormality.   Electronically Signed   By: Marlan Palau M.D.   On: 12/10/2012 12:42    EKG Interpretation   None       MDM   1. Dehydration   2. Elevated CK   3. Hyperglycemia   4. Generalized weakness    BP 124/59  Pulse 96  Temp(Src) 97.7 F (36.5 C) (Oral)  Resp 20  SpO2 99%  I have reviewed nursing notes and vital signs. I personally reviewed the imaging tests through PACS system  I reviewed available ER/hospitalization records thought the EMR     Fayrene Helper, [place] 12/10/12 1439

## 2012-12-10 NOTE — ED Notes (Signed)
Pt returned from xray. Ice water given per PA. Pt's tongue and mouth very dry.

## 2012-12-10 NOTE — ED Notes (Addendum)
Pt was on commode and lost balance and fell about 5 hours ago. Pt has knee abrasions, elbows and forehead from dragging himself to call for help. CBG 296.  Denies LOC, neck or back pain. Left foot has baseline paresis. Facility called EMS because they felt patient was in distress. PT reports he feels warm. Low grade fever per EMS.

## 2012-12-10 NOTE — ED Notes (Signed)
Notified RN of CBG 339 

## 2012-12-10 NOTE — ED Notes (Signed)
NOTIFIED DR. Denton Lank IN PERSON OF PATIENTS LAB RESULTS OF CG4 LACTIC ACID 2.57 mmoI/L @ 12:20 PM , 12/10/2012.

## 2012-12-10 NOTE — Progress Notes (Signed)
Spoke with Dr. Jerral Ralph about placement of urethral catheter, as was ordered by emergency room admitting provider. Per Dr. Jerral Ralph, foley catheter left out if post void residual was <400 mL. Patient voided using the urinal and post void bladder scan showed 87mL residual. Foley catheter not placed.

## 2012-12-10 NOTE — ED Notes (Signed)
Patient transported to X-ray 

## 2012-12-10 NOTE — H&P (Signed)
[facility] History and Physical  Ricky [name]. RUE:[id number] DOB: March 20, 1940 DOA: 12/10/2012  Referring physician: Fayrene Helper, PA-C PCP: Michiel Sites, MD  Specialists: Dr. Jennelle Human, Psychiatry  Chief Complaint: found down  HPI: Ricky [name]. is a 72 y.o. male with a history of diabetes mellitus, schizophrenia, mild dementia, dysphagia, urinary retention, and hypertension who resides in independent living. He was found down this morning when his caretaker arrived for work.  Ricky [name] reports that he got up to go to the bathroom last night and fell as he was trying to get off of the toilet. He dragged himself across the carpet and remained on the floor for the rest of the night. His aide reports that normally Ricky [name] is up and dressed when he arrives for work. This morning Ricky [name] was unable to be off the floor. Further the aide mentions that for the past week Ricky [name] energy level has been reduced, he has been sleeping in more than usual. Ricky [name] a close family friend reports that Ricky [name] has fallen multiple times in the past year. Ricky [name] is unable to recall these falls.  Ricky [name] is concerned about his elevated CBGs. He is trying to avoid going on insulin.  In the emergency department labs reveal a CK of 1965, lactic acid 2.57, WBC 12.0. Urine glucose is greater than 1000. CT head and chest x-ray are negative. The patient appears significantly dehydrated with reddened skin over the front of his body, and presumed rug burns on his forehead.  In and out cath in the emergency department produced over 1000 mL of urine.  Review of Systems: The patient denies anorexia, fever, weight loss, vision loss, decreased hearing, hoarseness, chest pain, syncope, dyspnea on exertion, peripheral edema, balance deficits, hemoptysis, abdominal pain, melena, hematochezia, severe indigestion/heartburn, hematuria, incontinence, muscle weakness, suspicious skin lesions, transient  blindness, depression, unusual weight change, abnormal bleeding.   Past Medical History  Diagnosis Date  . Diabetes mellitus without complication   . Hypertension   . Dementia   . Depression   . Arthritis   . Hyperlipidemia   . Cataract   . Urinary frequency   . Fatty liver   . Schizophrenia   . Peripheral edema   . Internal hemorrhoids   . Adenomatous colon polyp   . Type II or unspecified type diabetes mellitus without mention of complication, not stated as uncontrolled   . Abnormality of gait   . Other malaise and fatigue    Past Surgical History  Procedure Laterality Date  . Tonsillectomy    . Esophagogastroduodenoscopy  02/21/2012    Procedure: ESOPHAGOGASTRODUODENOSCOPY (EGD);  Surgeon: Theda Belfast, MD;  Location: [facility];  Service: Endoscopy;  Laterality: N/A;  . Esophagogastroduodenoscopy N/A 05/22/2012    Procedure: ESOPHAGOGASTRODUODENOSCOPY (EGD);  Surgeon: Theda Belfast, MD;  Location: [facility];  Service: Endoscopy;  Laterality: N/A;  . Balloon dilation N/A 05/22/2012    Procedure: BALLOON DILATION;  Surgeon: Theda Belfast, MD;  Location: [facility];  Service: Endoscopy;  Laterality: N/A;   Social History:  reports that he has never smoked. He has never used smokeless tobacco. He reports that he does not drink alcohol or use illicit drugs. Lives in independent living with an aide that comes in a few hours each morning.    Allergies  Allergen Reactions  . Asa [Aspirin]     "Dr told me not to take it"  . Codeine Other (See Comments)  DIZZINESS with Tylenol 3  . Tylenol [Acetaminophen] Other (See Comments)    Dizziness with tylenol 3    Family History  Problem Relation Age of Onset  . Brain cancer Father   Mother - Died in 60 of urinary problems  Prior to Admission medications   Medication Sig Start Date End Date Taking? Authorizing Provider  ALPRAZolam Prudy Feeler) 0.5 MG tablet Take 1 mg by mouth every evening.  12/12/11  Yes Historical  Provider, MD  amLODipine (NORVASC) 5 MG tablet Take 5 mg by mouth daily. 09/04/12  Yes Historical Provider, MD  Calcium Citrate-Vitamin D (CITRACAL + D PO) Take 1 tablet by mouth daily.   Yes Historical Provider, MD  celecoxib (CELEBREX) 200 MG capsule Take 200 mg by mouth 2 (two) times daily.   Yes Historical Provider, MD  cephALEXin (KEFLEX) 500 MG capsule Take 500 mg by mouth 2 (two) times daily. 12/04/12  Yes Historical Provider, MD  fluvoxaMINE (LUVOX) 100 MG tablet Take 100 mg by mouth at bedtime.  12/12/11  Yes Historical Provider, MD  furosemide (LASIX) 20 MG tablet Take 20 mg by mouth daily before breakfast.  12/07/11  Yes Historical Provider, MD  glimepiride (AMARYL) 2 MG tablet Take 2 mg by mouth daily before breakfast.   Yes Historical Provider, MD  GLUCOSAMINE-CHONDROITIN PO Take 1 tablet by mouth daily.   Yes Historical Provider, MD  hydrOXYzine (VISTARIL) 25 MG capsule Take 25 mg by mouth daily. 07/01/12  Yes Historical Provider, MD  hydrOXYzine (VISTARIL) 25 MG capsule Take 25-50 mg by mouth at bedtime as needed (for sleep).   Yes Historical Provider, MD  ketoconazole (NIZORAL) 2 % cream Apply 1 application topically 2 (two) times daily. 12/09/12  Yes Historical Provider, MD  metFORMIN (GLUCOPHAGE-XR) 500 MG 24 hr tablet Take 500 mg by mouth 2 (two) times daily. 09/30/12  Yes Historical Provider, MD  Moexipril-Hydrochlorothiazide 15-12.5 MG TABS Take 1 tablet by mouth daily after lunch.  12/06/11  Yes Historical Provider, MD  OLANZapine (ZYPREXA) 20 MG tablet Take 20 mg by mouth at bedtime.   Yes Historical Provider, MD  Omega 3 1200 MG CAPS Take 2 capsules by mouth daily.   Yes Historical Provider, MD  omeprazole (PRILOSEC) 40 MG capsule Take 40 mg by mouth daily. 09/28/12  Yes Historical Provider, MD  ONE TOUCH ULTRA TEST test strip  06/22/12  Yes Historical Provider, MD  ONGLYZA 5 MG TABS tablet Take 5 mg by mouth daily before breakfast.  12/06/11  Yes Historical Provider, MD   pravastatin (PRAVACHOL) 40 MG tablet Take 40 mg by mouth daily after lunch.  12/24/11  Yes Historical Provider, MD  SSD 1 % cream Apply 1 application topically 3 (three) times daily.  10/08/12  Yes Historical Provider, MD   Physical Exam: Filed Vitals:   12/10/12 1609  BP: 133/79  Pulse: 95  Temp: 97.8 F (36.6 C)  Resp: 18     General:  71 male, nad, in ED, NAD, burned areas of skin on his forehead.  Appears significantly dehydrated.  Eyes: Left eye stays closed most of the time.  Pupils equal and round.  ENT: Oropharynx is erythematous with small exudate. Patient has swollen parotid glands bilaterally, dry mucous membranes  Neck: Supple, no obvious lymphadenopathy  Cardiovascular: Regular rate and rhythm no murmurs rubs or gallops identified.  Respiratory: Good air movement, rales at the bases  Abdomen: Soft, protuberant, positive bowel sounds, nontender, no masses  Skin: Diffusely red, rug burns on forehead, scraped skin  on knees and elbows, dark or reddened areas increases below breasts  Musculoskeletal: 5 over 5 strength in each  Psychiatric: Alert and oriented, cooperative, pleasant, memory difficulties with specific questions  Neurologic: Slightly dysarthric, left eye tends to remain closed, otherwise no focal neuro deficits  Labs on Admission:  Basic Metabolic Panel:  Recent Labs Lab 12/10/12 1214  NA 140  K 4.2  CL 98  GLUCOSE 306*  BUN 20  CREATININE 1.40*   CBC:  Recent Labs Lab 12/10/12 1200 12/10/12 1214  WBC 12.0*  --   NEUTROABS 9.4*  --   HGB 13.5 15.3  HCT 40.5 45.0  MCV 86.4  --   PLT 206  --    Cardiac Enzymes:  Recent Labs Lab 12/10/12 1200  CKTOTAL 1965*    CBG:  Recent Labs Lab 12/10/12 1058  GLUCAP 339*    Radiological Exams on Admission: Dg Chest 2 View  12/10/2012   CLINICAL DATA:  Loss of consciousness.  EXAM: CHEST  2 VIEW  COMPARISON:  11/07/2011.  FINDINGS: Mediastinum hilar structures normal. Lungs are  clear. Cardiomegaly, no CHF. No pleural effusion or pneumothorax. Degenerative changes thoracic spine.  IMPRESSION: No active cardiopulmonary disease.   Electronically Signed   By: Maisie Fus  Register   On: 12/10/2012 12:35   Ct Head Wo Contrast  12/10/2012   CLINICAL DATA:  Fall.  EXAM: CT HEAD WITHOUT CONTRAST  TECHNIQUE: Contiguous axial images were obtained from the base of the skull through the vertex without intravenous contrast.  COMPARISON:  None.  FINDINGS: Ventricle size is normal. Age-appropriate atrophy. Negative for acute infarct. Negative for hemorrhage or mass. No skull fracture.  IMPRESSION: No acute abnormality.   Electronically Signed   By: Marlan Palau M.D.   On: 12/10/2012 12:42    EKG: Independently reviewed. Pending 2-D echo: (10/28/2012) shows grade 1 diastolic dysfunction   Assessment/Plan Principal Problem:   Rhabdomyolysis Active Problems:   Type II or unspecified type diabetes mellitus with neurological manifestations, not stated as uncontrolled(250.60)   Abnormality of gait   Hypertension   Hyperlipidemia   Schizophrenia   Urinary retention   Falls  Rhabdomyolysis Found down. Significantly dehydrated Gentle IV fluids (IVF will discontinue at 20 hours) Repeat CK, and lactic acid in a.m. Creatinine at 1.4 appears to be a baseline based on September 2014 labs   Mild Leukocytosis Fall vs infection. Given history of increased fatigue, decreased energy Elevated WBC-but no foci of sepsis evident, CXR neg for PNA, UA neg for UTI Reddened orpharynx with exudate. Will check rapid strep Not starting antibiotics for now-will monitor and follow clinical course   Significant dehydration with elevated lactic acid Gentle IV fluids Recheck labs in a.m.   Urinary retention 1000 mL of fluid removed with in and out cath in ED Place Foley-if patient still cannot void-currently attempting to void Being worked up outpatient Start Flomax   Dysarthria with a  history of dysphagia/aspiration-has hx of mild dysarthria CT head negative-very good strenght in all 4 extremities, per caretaker at bedside speech close to usual baseline. Do not suspect CVA at this time. Could be from significant dehydration Speech to evaluate   Uncontrolled diabetes Last hemoglobin A1c was 10 (September 2014 and Dr. Marylen Ponto office) Patient does not want to go on insulin Urine glucose greater than 1000, CBGs 300+ Patient reports he eats four pizzas   Recurrent falls  lives alone, with an aide that comes approximately 4 hours each morning PT to evaluate May need higher  level of care   Schizophrenia with mild dementia Controlled on Zyprexa and luvox.   Skin redness Could be from being down for hours, but is very generalized. If it doesn't resolve quickly I would be concerned for some type of allergic or drug reaction.    Code Status: DNR (discussed in ED) patient desires full treatment up to intubation and ACLS Family Communication: Aide and family friend Molli [name] [phone]) at bedside Disposition Plan: inpatient.    Time spent: 60 min.  Conley Canal [facility] Pager [phone]  If 7PM-7AM, please contact night-coverage [email or web address] Password [facility] 12/10/2012, 4:14 PM  Attending Patient seen and examined, above documentation reviewed, necessary edits made, agree with the assessment and plan. Basically has hx of left thigh/hip girdle weakness 2/2 Diabetic Amyotrophy, and mostly gets around in a wheelchair and is able to transfer from the wheelchair, He fell in the bathroom when trying to transfer. He never lost any conciousness, but was unable to get up. CT head negative, is clinically dry and has mild rhabdomyolysis. Gently hydrate overnight. Suspect may need a higher level of care. PT eval in am  S Selisa Tensley

## 2012-12-10 NOTE — ED Notes (Signed)
[facility] at bedside  

## 2012-12-11 DIAGNOSIS — R269 Unspecified abnormalities of gait and mobility: Secondary | ICD-10-CM

## 2012-12-11 LAB — GLUCOSE, CAPILLARY
Glucose-Capillary: 260 mg/dL — ABNORMAL HIGH (ref 70–99)
Glucose-Capillary: 274 mg/dL — ABNORMAL HIGH (ref 70–99)
Glucose-Capillary: 311 mg/dL — ABNORMAL HIGH (ref 70–99)
Glucose-Capillary: 268 mg/dL — ABNORMAL HIGH (ref 70–99)

## 2012-12-11 LAB — HEMOGLOBIN A1C
Hgb A1c MFr Bld: 10.7 % — ABNORMAL HIGH (ref <5.7)
Mean Plasma Glucose: 260 mg/dL — ABNORMAL HIGH (ref <117)

## 2012-12-11 LAB — BASIC METABOLIC PANEL
BUN: 15 mg/dL (ref 6–23)
Chloride: 102 mEq/L (ref 96–112)
Creatinine, Ser: 0.94 mg/dL (ref 0.50–1.35)
Glucose, Bld: 267 mg/dL — ABNORMAL HIGH (ref 70–99)
Potassium: 4.6 mEq/L (ref 3.5–5.1)

## 2012-12-11 NOTE — Progress Notes (Signed)
Inpatient Diabetes Program Recommendations  AACE/ADA: New Consensus Statement on Inpatient Glycemic Control (2013)  Target Ranges:  Prepandial:   less than 140 mg/dL      Peak postprandial:   less than 180 mg/dL (1-2 hours)      Critically ill patients:  140 - 180 mg/dL     Results for Ricky [name], Ricky [name] (MRN [id number]) as of 12/11/2012 12:56  Ref. Range 12/10/2012 10:58 12/10/2012 15:51 12/10/2012 20:39  Glucose-Capillary Latest Range: 70-99 mg/dL 409 (H) 811 (H) 914 (H)    Results for Ricky [name], Ricky [name] (MRN [id number]) as of 12/11/2012 12:56  Ref. Range 12/11/2012 07:22 12/11/2012 11:28  Glucose-Capillary Latest Range: 70-99 mg/dL 086 (H) 578 (H)    **Patient admitted with rhabdomyolysis.  Lives in an independent living facility here in [place].  Has a caretaker that offers him assistance every morning (7 days a week) from 9:30a-12:30pm.  Sees Dr. Juleen China with [facility] for DM management.  **Per records, patient takes the following DM medications at home: Amaryl 2 mg daily Metformin 500 mg bid  **Went in to speak with patient about his home DM regimen.  Patient was very sleepy and could barely keep his eyes open during our conversation.  Patient's caretaker at bedside.  Patient did tell me that he doesn't have a problem taking pills but that he really doesn't want to take insulin at home.  Patient told me that he does not want to take shots b/c he doesn't want to "stick" himself everyday.  Could not ascertain how often patient is checking his CBGs at home (or if he is checking them at all).  Reminded patient about the importance of good CBG control and explained to him that if his A1c comes back elevated, that the MD may want to send patient home on insulin (also explained to patient what an A1c measures and what his goal should be [7% or less]).    **Based on patient's current CBG levels, recommend the following in-hospital insulin adjustments: 1. Increase  Lantus to 20 units QHS (0.2 units/kg dosing) 2. Add meal coverage- Novolog 4 units tid with meals   Will follow. Ambrose Finland RN, MSN, CDE Diabetes Coordinator Inpatient Diabetes Program Team Pager: [phone] (8a-10p)

## 2012-12-11 NOTE — Evaluation (Signed)
Physical Therapy Evaluation Patient Details Name: Ricky [name]. MRN: [id number] DOB: 07/06/1940 Today's Date: 12/11/2012 Time: 8657-8469 PT Time Calculation (min): 39 min  PT Assessment / Plan / Recommendation History of Present Illness  Pt is a 72 y.o. male found on the floor of his apartment at [employer]. Pt with elevated CK levels in ED; being treated for rhabdomyolysis. PMH includes DM, Dementia, dysphagia, urinary retention, HTN, schizophrenia.   Clinical Impression  Pt adm secondary to above. Presents with decreased independence with mobility and transfers secondary to deficits listed below (see PT problem list). Pt to benefit from skilled PT in acute setting to address deficits listed below and facilitate safe D/C planning to next venue. Due to cognitive deficits and pt being a poor historian; PT contacted emergency contact ([name]). Pt with very difficult D/C disposition. Pt has home instead caregiver for morning hours 7x a week. However, pt has had multiple recent falls and demo decreased safety and independence with transfers. Pt will require 24/7 (A) upon acute D/C. Spoke with emergency contact, CSW, patient and CM regarding safest D/C disposition. At this time, PT will recommend SNF upon acute D/C.     PT Assessment  Patient needs continued PT services    Follow Up Recommendations  SNF;Supervision/Assistance - 24 hour    Does the patient have the potential to tolerate intense rehabilitation      Barriers to Discharge Decreased caregiver support pt stays in independent living unit; is alone from lunch till next morning; has had multiple recent falls per friend     Equipment Recommendations  Other (comment) (TBD)    Recommendations for Other Services OT consult   Frequency Min 2X/week    Precautions / Restrictions Precautions Precautions: Fall Precaution Comments: per chart and emergency contact pt has experienced multiple falls recently  Restrictions Weight  Bearing Restrictions: No   Pertinent Vitals/Pain C/o pain in Lt knee 5/10. patient repositioned for comfort       Mobility  Bed Mobility Bed Mobility: Supine to Sit;Sitting - Scoot to Edge of Bed Supine to Sit: 4: Min assist;HOB elevated;With rails Sitting - Scoot to Edge of Bed: 5: Supervision;With rail Details for Bed Mobility Assistance: min (A) to bring shoulders to upright sitting position; required increased time due to generalized weakness; cues for hand placement and sequencing  Transfers Transfers: Sit to Stand;Stand to Sit;Lateral/Scoot Transfers Sit to Stand: 1: +2 Total assist;From bed;With upper extremity assist;From elevated surface Sit to Stand: Patient Percentage: 10% Stand to Sit: 1: +2 Total assist;To bed;With upper extremity assist Stand to Sit: Patient Percentage: 10% Lateral/Scoot Transfers: 1: +2 Total assist;With armrests removed;From elevated surface Lateral Transfers: Patient Percentage: 40% Details for Transfer Assistance: attempted sit to stand x2; unable to achieve sit to stand when attempting to have pt use RW; pt with decr ability to follow cues for safety and sequencing with RW to achieve upright standing position. was able to achieve upright standing position with 2+ (A) and using draw pad to extend hips; each person had to block knees to prevent buckling; pt unsteady to transfer with SPT technique at this time-- pt pushing posteriorly throughout standing; was able to perform lateral transfer with 2+ by (A) hips and blocking LEs; use of draw pad helpful to facilitate weight shifting and lateral transfer  Ambulation/Gait Ambulation/Gait Assistance: Not tested (comment) (pt has been non-ambulatory for years per friend) Stairs: No Naval architect Mobility: No    Exercises General Exercises - Lower Extremity Ankle Circles/Pumps:  AROM;Strengthening;10 reps;Supine   PT Diagnosis: Difficulty walking;Generalized weakness;Acute pain  PT Problem  List: Decreased strength;Decreased activity tolerance;Decreased balance;Decreased mobility;Decreased cognition;Decreased knowledge of use of DME;Decreased safety awareness;Pain PT Treatment Interventions: DME instruction;Gait training;Functional mobility training;Therapeutic activities;Therapeutic exercise;Balance training;Neuromuscular re-education;Wheelchair mobility training;Patient/family education     PT Goals(Current goals can be found in the care plan section) Acute Rehab PT Goals Patient Stated Goal: to go to my studio PT Goal Formulation: With patient Time For Goal Achievement: 12/25/12 Potential to Achieve Goals: Fair  Visit Information  Last PT Received On: 12/11/12 Assistance Needed: +2 History of Present Illness: Pt is a 72 y.o. male found on the floor of his apartment at [employer]. Pt with elevated CK levels in ED; being treated for rhabdomyolysis. PMH includes DM, Dementia,        Prior Functioning  Home Living Family/patient expects to be discharged to:: Other (Comment) Additional Comments: [facility] Prior Function Level of Independence: Needs assistance Gait / Transfers Assistance Needed: per pt and family friend, pt was able to transfer on his knees to/from different surfaces of apartment but has been having increased difficulty lately; pt primarily wheelchair bound for mobility  ADL's / Homemaking Assistance Needed: has caregiver 7 days a week to (A) with ADL's  Comments: pt is a poor historian and friend has difficulty providing full PLOF  Communication Communication: No difficulties    Cognition  Cognition Arousal/Alertness: Awake/alert Behavior During Therapy: Impulsive;Anxious Overall Cognitive Status: Impaired/Different from baseline Area of Impairment: Orientation;Safety/judgement;Problem solving;Memory Orientation Level: Disoriented to;Situation Memory: Decreased short-term memory Safety/Judgement: Decreased awareness of  safety;Decreased awareness of deficits Problem Solving: Difficulty sequencing;Requires verbal cues;Requires tactile cues;Slow processing General Comments: after having to have 2+ (A) for transfers pt stated "i could do that myself"; pt unrealsitic on level of (A) needed at this time; pt would also be speaking on a certain subjec then would switch abruptly to a different subject    Extremity/Trunk Assessment Upper Extremity Assessment Upper Extremity Assessment: Defer to OT evaluation Lower Extremity Assessment Lower Extremity Assessment: Generalized weakness;LLE deficits/detail LLE Deficits / Details: Lt LE 2+/5; knee and hip; difficulty assessing fully due to impaired cognition  Cervical / Trunk Assessment Cervical / Trunk Assessment: Kyphotic   Balance Balance Balance Assessed: Yes Static Sitting Balance Static Sitting - Balance Support: No upper extremity supported;Feet supported Static Sitting - Level of Assistance: 5: Stand by assistance Static Sitting - Comment/# of Minutes: tolerated sitting ~10 min   End of Session PT - End of Session Equipment Utilized During Treatment: Gait belt Activity Tolerance: Patient tolerated treatment well Patient left: in chair;with call bell/phone within reach;with nursing/sitter in room Nurse Communication: Mobility status;Need for lift equipment  GP     Donell Sievert, [place] [phone] 12/11/2012, 2:34 PM

## 2012-12-11 NOTE — Evaluation (Signed)
Clinical/Bedside Swallow Evaluation Patient Details  Name: Ricky [name]. MRN: [id number] Date of Birth: 1940/09/06  Today's Date: 12/11/2012 Time: 1335-1350 SLP Time Calculation (min): 15 min  Past Medical History:  Past Medical History  Diagnosis Date  . Diabetes mellitus without complication   . Hypertension   . Dementia   . Depression   . Arthritis   . Hyperlipidemia   . Cataract   . Urinary frequency   . Fatty liver   . Schizophrenia   . Peripheral edema   . Internal hemorrhoids   . Adenomatous colon polyp   . Type II or unspecified type diabetes mellitus without mention of complication, not stated as uncontrolled   . Abnormality of gait   . Other malaise and fatigue    Past Surgical History:  Past Surgical History  Procedure Laterality Date  . Tonsillectomy    . Esophagogastroduodenoscopy  02/21/2012    Procedure: ESOPHAGOGASTRODUODENOSCOPY (EGD);  Surgeon: Theda Belfast, MD;  Location: [facility];  Service: Endoscopy;  Laterality: N/A;  . Esophagogastroduodenoscopy N/A 05/22/2012    Procedure: ESOPHAGOGASTRODUODENOSCOPY (EGD);  Surgeon: Theda Belfast, MD;  Location: [facility];  Service: Endoscopy;  Laterality: N/A;  . Balloon dilation N/A 05/22/2012    Procedure: BALLOON DILATION;  Surgeon: Theda Belfast, MD;  Location: [facility];  Service: Endoscopy;  Laterality: N/A;   HPI:  72 y.o. male admitted with rhabdomyolysis due to fall (down 4-5 hours) and dehydration; mild leukocytosis; chronic dysarthria and hx of dysphagia.  CT neg for acute intracranial abnormality.  Pt with hx of peptic esophageal rings.  Has had two EGDs in 2014 and dilatation 5/14.   Assessment / Plan / Recommendation Clinical Impression  Pt presents with normal oropharyngeal swallow function marked by active mastication, swift swallow response, and no overt s/s of aspiration.  Pt states swallowing has been without problems since dilatation last spring.  Recommend regular consistency  diet, thin liquids.  SLP to sign off.            Diet Recommendation Regular;Thin liquid   Liquid Administration via: Cup;Straw Medication Administration: Whole meds with liquid Supervision: Patient able to self feed    Other  Recommendations Oral Care Recommendations: Oral care BID   Follow Up Recommendations  None     Swallow Study Prior Functional Status       General  Type of Study: Bedside swallow evaluation Previous Swallow Assessment: none per records Diet Prior to this Study: Dysphagia 3 (soft);Thin liquids Temperature Spikes Noted: No Respiratory Status: Room air Behavior/Cognition: Alert;Cooperative Oral Cavity - Dentition: Adequate natural dentition Self-Feeding Abilities: Able to feed self Patient Positioning: Upright in chair Baseline Vocal Quality: Clear Volitional Cough: Strong Volitional Swallow: Able to elicit    Oral/Motor/Sensory Function Overall Oral Motor/Sensory Function: Appears within functional limits for tasks assessed   Ice Chips Ice chips: Within functional limits Presentation: Self Fed   Thin Liquid Thin Liquid: Within functional limits Presentation: Self Fed;Straw    Nectar Thick Nectar Thick Liquid: Not tested   Honey Thick Honey Thick Liquid: Not tested   Puree Puree: Within functional limits Presentation: Self Fed;Spoon   Solid  Ricky [name] L. [place], [place] CCC/SLP Pager [phone]     Solid: Within functional limits Presentation: Self Fed       Ricky [name] Ricky [name] 12/11/2012,1:57 PM

## 2012-12-11 NOTE — ED Provider Notes (Signed)
Medical screening examination/treatment/procedure(s) were conducted as a shared visit with non-physician practitioner(s) and myself.  I personally evaluated the patient during the encounter.  EKG Interpretation    Date/Time:    Ventricular Rate:    PR Interval:    QRS Duration:   QT Interval:    QTC Calculation:   R Axis:     Text Interpretation:              Pt with generalized weakness, earlier fall w inability to get up on own. Spine nt. Chest cta. abd soft nt. Labs. Ivf. Admit.   Suzi Roots, MD 12/11/12 [phone]

## 2012-12-11 NOTE — Progress Notes (Signed)
Utilization review completed.  

## 2012-12-11 NOTE — Progress Notes (Signed)
[facility] PROGRESS NOTE  Ricky [name]. BJY:[id number] DOB: [birth year] DOA: 12/10/2012 PCP: Michiel Sites, MD  Assessment/Plan:  1. Rhabdomyolysis  -due to fall and dehydration -continue IVF, CK in am -CK improving   2. Mild Leukocytosis  -suspect reactive, afebrile -improving, no obvious soruce of infection, monitor  3. Significant dehydration with elevated lactic acid  -continue IV fluids  -Recheck labs in a.m.   4. Urinary retention  -1000 mL of fluid removed with in and out cath in ED  - Place Foley-if patient still cannot void-currently attempting to void  - Start Flomax   5. Dysarthria with a history of dysphagia/aspiration-has hx of mild dysarthria  -CT head negative - per patient this was noted 2 days ago, but again his mild dementia, not clear how relaible he is -will check MRI brain -Pt and ST eval  7. Uncontrolled diabetes  -Last hemoglobin A1c was 10 (September 2014 and Dr. Marylen Ponto office)  -SSI for now -start oral agents at DC  8. Recurrent falls  -lives alone, with an aide that comes approximately 4 hours each morning  -PT to evaluate  -May need higher level of care   9. Schizophrenia with mild dementia  -Controlled on Zyprexa and luvox.   Code Status: DNR (discussed in ED) patient desires full treatment up to intubation and ACLS  Family Communication: Aide and family friend [name] [phone]) at bedside  Disposition Plan: inpatient.    HPI/Subjective: Still with dysarthria, no new complaints  Objective: Filed Vitals:   12/11/12 0957  BP: 133/75  Pulse: 100  Temp: 97.5 F (36.4 C)  Resp: 18    Intake/Output Summary (Last 24 hours) at 12/11/12 1123 Last data filed at 12/11/12 0951  Gross per 24 hour  Intake    540 ml  Output   2025 ml  Net  -1485 ml   Filed Weights   12/10/12 2036  Weight: 108.682 kg (239 lb 9.6 oz)    Exam:   General: alert, awake, oriented to self,  place  Cardiovascular:S1S2/RRR  Respiratory: CTAB  Abdomen: soft, NT, BS present  Musculoskeletal:trace edema no c/c  Neuro: dysarthria  Data Reviewed: Basic Metabolic Panel:  Recent Labs Lab 12/10/12 1214 12/10/12 1856 12/11/12 0530  NA 140  --  140  K 4.2  --  4.6  CL 98  --  102  CO2  --   --  26  GLUCOSE 306*  --  267*  BUN 20  --  15  CREATININE 1.40* 0.95 0.94  CALCIUM  --   --  8.9   Liver Function Tests: No results found for this basename: AST, ALT, ALKPHOS, BILITOT, PROT, ALBUMIN,  in the last 168 hours No results found for this basename: LIPASE, AMYLASE,  in the last 168 hours No results found for this basename: AMMONIA,  in the last 168 hours CBC:  Recent Labs Lab 12/10/12 1200 12/10/12 1214 12/10/12 1856  WBC 12.0*  --  10.7*  NEUTROABS 9.4*  --   --   HGB 13.5 15.3 14.7  HCT 40.5 45.0 43.2  MCV 86.4  --  88.2  PLT 206  --  192   Cardiac Enzymes:  Recent Labs Lab 12/10/12 1200 12/11/12 0530  CKTOTAL 1965* 1144*   BNP (last 3 results) No results found for this basename: PROBNP,  in the last 8760 hours CBG:  Recent Labs Lab 12/10/12 1058 12/10/12 1551 12/10/12 2039 12/11/12 0722  GLUCAP 339* 229* 293* 260*  Recent Results (from the past 240 hour(s))  RAPID STREP SCREEN     Status: None   Collection Time    12/10/12  6:14 PM      Result Value Range Status   Streptococcus, Group A Screen (Direct) NEGATIVE  NEGATIVE Final   Comment: (NOTE)     A Rapid Antigen test may result negative if the antigen level in the     sample is below the detection level of this test. The FDA has not     cleared this test as a stand-alone test therefore the rapid antigen     negative result has reflexed to a Group A Strep culture.  MRSA PCR SCREENING     Status: None   Collection Time    12/10/12  6:15 PM      Result Value Range Status   MRSA by PCR NEGATIVE  NEGATIVE Final   Comment:            The GeneXpert MRSA Assay (FDA     approved for  NASAL specimens     only), is one component of a     comprehensive MRSA colonization     surveillance program. It is not     intended to diagnose MRSA     infection nor to guide or     monitor treatment for     MRSA infections.     Studies: Dg Chest 2 View  12/10/2012   CLINICAL DATA:  Loss of consciousness.  EXAM: CHEST  2 VIEW  COMPARISON:  11/07/2011.  FINDINGS: Mediastinum hilar structures normal. Lungs are clear. Cardiomegaly, no CHF. No pleural effusion or pneumothorax. Degenerative changes thoracic spine.  IMPRESSION: No active cardiopulmonary disease.   Electronically Signed   By: Maisie Fus  Register   On: 12/10/2012 12:35   Ct Head Wo Contrast  12/10/2012   CLINICAL DATA:  Fall.  EXAM: CT HEAD WITHOUT CONTRAST  TECHNIQUE: Contiguous axial images were obtained from the base of the skull through the vertex without intravenous contrast.  COMPARISON:  None.  FINDINGS: Ventricle size is normal. Age-appropriate atrophy. Negative for acute infarct. Negative for hemorrhage or mass. No skull fracture.  IMPRESSION: No acute abnormality.   Electronically Signed   By: Marlan Palau M.D.   On: 12/10/2012 12:42    Scheduled Meds: . ALPRAZolam  1 mg Oral QPM  . antiseptic oral rinse  15 mL Mouth Rinse BID  . docusate sodium  100 mg Oral BID  . fluvoxaMINE  100 mg Oral QHS  . heparin  5,000 Units Subcutaneous Q8H  . insulin aspart  0-9 Units Subcutaneous TID WC  . insulin glargine  5 Units Subcutaneous QHS  . ketoconazole  1 application Topical BID  . OLANZapine  20 mg Oral QHS  . omega-3 acid ethyl esters  2 g Oral Daily  . simvastatin  20 mg Oral q1800  . tamsulosin  0.4 mg Oral QPC supper   Continuous Infusions: . sodium chloride 100 mL/hr at 12/10/12 1601    Principal Problem:   Rhabdomyolysis Active Problems:   Type II or unspecified type diabetes mellitus with neurological manifestations, not stated as uncontrolled(250.60)   Abnormality of gait   Hypertension    Hyperlipidemia   Schizophrenia   Urinary retention   Falls    Time spent:    [facility]  [facility] Pager [phone]. If 7PM-7AM, please contact night-coverage at [email or web address], password [email or web address] 12/11/2012, 11:23 AM  LOS: 1 day
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
## 2012-12-12 ENCOUNTER — Inpatient Hospital Stay (HOSPITAL_COMMUNITY): Payer: Medicare Other

## 2012-12-12 DIAGNOSIS — W19XXXA Unspecified fall, initial encounter: Secondary | ICD-10-CM

## 2012-12-12 LAB — BASIC METABOLIC PANEL
BUN: 10 mg/dL (ref 6–23)
CO2: 29 mEq/L (ref 19–32)
Calcium: 9.3 mg/dL (ref 8.4–10.5)
Chloride: 97 mEq/L (ref 96–112)
Creatinine, Ser: 0.9 mg/dL (ref 0.50–1.35)
GFR calc Af Amer: >90 mL/min (ref >90)
GFR calc non Af Amer: 82 mL/min — ABNORMAL LOW (ref >90)
Glucose, Bld: 337 mg/dL — ABNORMAL HIGH (ref 70–99)
Potassium: 4.1 mEq/L (ref 3.5–5.1)

## 2012-12-12 LAB — GLUCOSE, CAPILLARY
Glucose-Capillary: 266 mg/dL — ABNORMAL HIGH (ref 70–99)
Glucose-Capillary: 408 mg/dL — ABNORMAL HIGH (ref 70–99)

## 2012-12-12 LAB — CBC
HCT: 41.7 % (ref 39.0–52.0)
Hemoglobin: 14.2 g/dL (ref 13.0–17.0)
MCH: 30.4 pg (ref 26.0–34.0)
MCHC: 34.1 g/dL (ref 30.0–36.0)
MCV: 89.3 fL (ref 78.0–100.0)
RBC: 4.67 MIL/uL (ref 4.22–5.81)
RDW: 13.2 % (ref 11.5–15.5)

## 2012-12-12 LAB — CK: Total CK: 457 U/L — ABNORMAL HIGH (ref 7–232)

## 2012-12-12 MED ORDER — SODIUM CHLORIDE 0.9 % IV SOLN
INTRAVENOUS | Status: DC
  Administered 2012-12-12: 12:00:00 via INTRAVENOUS

## 2012-12-12 MED ORDER — INSULIN GLARGINE 100 UNIT/ML ~~LOC~~ SOLN
15.0000 [IU] | Freq: Every day | SUBCUTANEOUS | Status: DC
  Administered 2012-12-12 – 2012-12-13 (×2): 15 [IU] via SUBCUTANEOUS
  Filled 2012-12-12 (×2): qty 0.15

## 2012-12-12 NOTE — Progress Notes (Addendum)
[facility] PROGRESS NOTE  Ricky [name]. ZOX:[id number] DOB: 1940/09/25 DOA: 12/10/2012 PCP: Michiel Sites, MD  Assessment/Plan:  1. Rhabdomyolysis  -due to fall and dehydration -resume IVF, decrase rate, CK in am -CK improving   2. Mild Leukocytosis  -suspect reactive, afebrile -improving, no obvious soruce of infection, monitor  3. Significant dehydration with elevated lactic acid  -continue IV fluids, lactic acidosis resolved   4. Urinary retention  -1000 mL of fluid removed with in and out cath in ED  -voiding independently now, continue flomax  5. Dysarthria with a history of dysphagia/aspiration-has hx of mild dysarthria  -CT head negative - per patient this was noted 2-3 days ago, but again his mild dementia, not clear how relaible he is - MRI brain still pending -Pt and ST eval  7. Uncontrolled diabetes  -Last hemoglobin A1c was 10 (September 2014 and Dr. Marylen Ponto office)  -start low dose lantus, SSI  8. Recurrent falls  -lives alone, with an aide that comes approximately 4 hours each morning  -PT to evaluate  -needs SNF  9. Schizophrenia with mild dementia  -Controlled on Zyprexa and luvox.   Code Status: DNR (discussed in ED) patient desires full treatment up to intubation and ACLS  Family Communication: Aide and family friend [name] [phone]) at bedside  Disposition Plan: inpatient.    HPI/Subjective: Still with dysarthria, no new complaints  Objective: Filed Vitals:   12/12/12 0931  BP: 128/86  Pulse: 93  Temp: 97.1 F (36.2 C)  Resp: 16    Intake/Output Summary (Last 24 hours) at 12/12/12 1134 Last data filed at 12/12/12 0931  Gross per 24 hour  Intake    700 ml  Output   5850 ml  Net  -5150 ml   Filed Weights   12/10/12 2036 12/11/12 2054  Weight: 108.682 kg (239 lb 9.6 oz) 109.317 kg (241 lb)    Exam:   General: alert, awake, oriented to self, place  Cardiovascular:S1S2/RRR  Respiratory:  CTAB  Abdomen: soft, NT, BS present  Musculoskeletal:trace edema no c/c  Neuro: dysarthria  Skin brises/scabs on face, knee, legs etc  Data Reviewed: Basic Metabolic Panel:  Recent Labs Lab 12/10/12 1214 12/10/12 1856 12/11/12 0530  NA 140  --  140  K 4.2  --  4.6  CL 98  --  102  CO2  --   --  26  GLUCOSE 306*  --  267*  BUN 20  --  15  CREATININE 1.40* 0.95 0.94  CALCIUM  --   --  8.9   Liver Function Tests: No results found for this basename: AST, ALT, ALKPHOS, BILITOT, PROT, ALBUMIN,  in the last 168 hours No results found for this basename: LIPASE, AMYLASE,  in the last 168 hours No results found for this basename: AMMONIA,  in the last 168 hours CBC:  Recent Labs Lab 12/10/12 1200 12/10/12 1214 12/10/12 1856 12/12/12 1045  WBC 12.0*  --  10.7* 7.0  NEUTROABS 9.4*  --   --   --   HGB 13.5 15.3 14.7 14.2  HCT 40.5 45.0 43.2 41.7  MCV 86.4  --  88.2 89.3  PLT 206  --  192 162   Cardiac Enzymes:  Recent Labs Lab 12/10/12 1200 12/11/12 0530  CKTOTAL 1965* 1144*   BNP (last 3 results) No results found for this basename: PROBNP,  in the last 8760 hours CBG:  Recent Labs Lab 12/11/12 0722 12/11/12 1128 12/11/12 1610 12/11/12 2051 12/12/12 5621  GLUCAP 260* 274* 311* 268* 266*    Recent Results (from the past 240 hour(s))  RAPID STREP SCREEN     Status: None   Collection Time    12/10/12  6:14 PM      Result Value Range Status   Streptococcus, Group A Screen (Direct) NEGATIVE  NEGATIVE Final   Comment: (NOTE)     A Rapid Antigen test may result negative if the antigen level in the     sample is below the detection level of this test. The FDA has not     cleared this test as a stand-alone test therefore the rapid antigen     negative result has reflexed to a Group A Strep culture.  MRSA PCR SCREENING     Status: None   Collection Time    12/10/12  6:15 PM      Result Value Range Status   MRSA by PCR NEGATIVE  NEGATIVE Final   Comment:             The GeneXpert MRSA Assay (FDA     approved for NASAL specimens     only), is one component of a     comprehensive MRSA colonization     surveillance program. It is not     intended to diagnose MRSA     infection nor to guide or     monitor treatment for     MRSA infections.     Studies: Dg Chest 2 View  12/10/2012   CLINICAL DATA:  Loss of consciousness.  EXAM: CHEST  2 VIEW  COMPARISON:  11/07/2011.  FINDINGS: Mediastinum hilar structures normal. Lungs are clear. Cardiomegaly, no CHF. No pleural effusion or pneumothorax. Degenerative changes thoracic spine.  IMPRESSION: No active cardiopulmonary disease.   Electronically Signed   By: Maisie Fus  Register   On: 12/10/2012 12:35   Ct Head Wo Contrast  12/10/2012   CLINICAL DATA:  Fall.  EXAM: CT HEAD WITHOUT CONTRAST  TECHNIQUE: Contiguous axial images were obtained from the base of the skull through the vertex without intravenous contrast.  COMPARISON:  None.  FINDINGS: Ventricle size is normal. Age-appropriate atrophy. Negative for acute infarct. Negative for hemorrhage or mass. No skull fracture.  IMPRESSION: No acute abnormality.   Electronically Signed   By: Marlan Palau M.D.   On: 12/10/2012 12:42    Scheduled Meds: . ALPRAZolam  1 mg Oral QPM  . antiseptic oral rinse  15 mL Mouth Rinse BID  . docusate sodium  100 mg Oral BID  . fluvoxaMINE  100 mg Oral QHS  . heparin  5,000 Units Subcutaneous Q8H  . insulin aspart  0-9 Units Subcutaneous TID WC  . insulin glargine  15 Units Subcutaneous QHS  . ketoconazole  1 application Topical BID  . OLANZapine  20 mg Oral QHS  . omega-3 acid ethyl esters  2 g Oral Daily  . simvastatin  20 mg Oral q1800  . tamsulosin  0.4 mg Oral QPC supper   Continuous Infusions:    Principal Problem:   Rhabdomyolysis Active Problems:   Type II or unspecified type diabetes mellitus with neurological manifestations, not stated as uncontrolled(250.60)   Abnormality of gait   Hypertension    Hyperlipidemia   Schizophrenia   Urinary retention   Falls    Time spent:    [facility]  [facility] Pager [phone]. If 7PM-7AM, please contact night-coverage at [email or web address], password [facility] 12/12/2012, 11:34 AM  LOS: 2 days

## 2012-12-13 LAB — CBC
HCT: 40.5 % (ref 39.0–52.0)
Hemoglobin: 13.6 g/dL (ref 13.0–17.0)
MCH: 30 pg (ref 26.0–34.0)
MCV: 89.2 fL (ref 78.0–100.0)
Platelets: 183 10*3/uL (ref 150–400)
RBC: 4.54 MIL/uL (ref 4.22–5.81)
RDW: 13.2 % (ref 11.5–15.5)
WBC: 6.1 10*3/uL (ref 4.0–10.5)

## 2012-12-13 LAB — BASIC METABOLIC PANEL
BUN: 11 mg/dL (ref 6–23)
CO2: 28 mEq/L (ref 19–32)
Chloride: 100 mEq/L (ref 96–112)
Creatinine, Ser: 0.93 mg/dL (ref 0.50–1.35)
GFR calc Af Amer: >90 mL/min (ref >90)
Glucose, Bld: 266 mg/dL — ABNORMAL HIGH (ref 70–99)
Potassium: 3.8 mEq/L (ref 3.5–5.1)

## 2012-12-13 LAB — CK: Total CK: 277 U/L — ABNORMAL HIGH (ref 7–232)

## 2012-12-13 LAB — GLUCOSE, CAPILLARY
Glucose-Capillary: 285 mg/dL — ABNORMAL HIGH (ref 70–99)
Glucose-Capillary: 329 mg/dL — ABNORMAL HIGH (ref 70–99)
Glucose-Capillary: 249 mg/dL — ABNORMAL HIGH (ref 70–99)

## 2012-12-13 MED ORDER — INFLUENZA VAC SPLIT QUAD 0.5 ML IM SUSP
0.5000 mL | INTRAMUSCULAR | Status: AC
  Administered 2012-12-14: 0.5 mL via INTRAMUSCULAR
  Filled 2012-12-13: qty 0.5

## 2012-12-14 LAB — GLUCOSE, CAPILLARY: Glucose-Capillary: 269 mg/dL — ABNORMAL HIGH (ref 70–99)

## 2012-12-14 MED ORDER — ALPRAZOLAM 0.5 MG PO TABS: 1.0000 mg | ORAL_TABLET | Freq: Every evening | ORAL | Status: DC

## 2012-12-14 MED ORDER — INSULIN GLARGINE 100 UNIT/ML ~~LOC~~ SOLN
20.0000 [IU] | Freq: Every day | SUBCUTANEOUS | Status: DC
  Filled 2012-12-14: qty 0.2

## 2012-12-14 MED ORDER — INSULIN GLARGINE 100 UNIT/ML ~~LOC~~ SOLN: 15.0000 [IU] | Freq: Every day | SUBCUTANEOUS | Status: DC

## 2012-12-14 NOTE — Progress Notes (Signed)
Physical Therapy Treatment Patient Details Name: Ricky [name]. MRN: [id number] DOB: Jan 20, 1941 Today's Date: 12/14/2012 Time: 4098-1191 PT Time Calculation (min): 31 min  PT Assessment / Plan / Recommendation  History of Present Illness Pt is a 72 y.o. male found on the floor of his apartment at [employer]. Pt with elevated CK levels in ED; being treated for rhabdomyolysis. PMH includes DM, Dementia,    PT Comments   Patient demonstrates improved upperbody strength able to lift up to squat position on 3:1 using arm strength for about 10 seconds while being assisted for hygiene.  Feel left quad defect may be due to tendon rupture in the past.  Pt. Agreeable to SNF level rehab and reports d/c planned for today.  Follow Up Recommendations  SNF;Supervision/Assistance - 24 hour     Does the patient have the potential to tolerate intense rehabilitation   N/A  Barriers to Discharge  None      Equipment Recommendations  None recommended by PT    Recommendations for Other Services  None  Frequency Min 2X/week   Progress towards PT Goals Progress towards PT goals: Progressing toward goals  Plan Current plan remains appropriate    Precautions / Restrictions Precautions Precautions: Fall Precaution Comments: per chart and emergency contact pt has experienced multiple falls recently  Restrictions Weight Bearing Restrictions: No   Pertinent Vitals/Pain No pain complaints    Mobility  Bed Mobility Bed Mobility: Supine to Sit;Sitting - Scoot to Edge of Bed Supine to Sit: 4: Min guard;With rails;HOB elevated Sitting - Scoot to Edge of Bed: 4: Min guard Details for Bed Mobility Assistance: NT; pt in chair Transfers Sit to Stand: 1: +2 Total assist;From chair/3-in-1 Sit to Stand: Patient Percentage: 50% Stand to Sit: To chair/3-in-1;1: +2 Total assist Stand to Sit: Patient Percentage: 50% Lateral/Scoot Transfers: 1: +2 Total assist Lateral Transfers: Patient Percentage:  40% Details for Transfer Assistance: transferred from recliner <> 3:1 with scooting technique.  Had attempted to stand with assist 2 trials unsuccessfully.  Pt with left quad deficiency and weak right quad and tight hamstrings unable to stand.   Ambulation/Gait Ambulation/Gait Assistance: Not tested (comment) (pt nonambulatory)    Exercises General Exercises - Lower Extremity Ankle Circles/Pumps: AROM;Both;10 reps;Seated Short Arc Quad: AROM;Right;10 reps;Seated Hip ABduction/ADduction: AROM;Both;10 reps;Seated Straight Leg Raises: AAROM;AROM;Both;10 reps;20 reps;Seated      PT Goals (current goals can now be found in the care plan section) Acute Rehab PT Goals Patient Stated Goal: to go to SNF and then back to [facility]  Visit Information  Last PT Received On: 12/14/12 Assistance Needed: +2 History of Present Illness: Pt is a 72 y.o. male found on the floor of his apartment at [employer]. Pt with elevated CK levels in ED; being treated for rhabdomyolysis. PMH includes DM, Dementia,     Subjective Data  Patient Stated Goal: to go to SNF and then back to [facility]   Cognition  Cognition Arousal/Alertness: Awake/alert Behavior During Therapy: Impulsive Overall Cognitive Status: Impaired/Different from baseline Memory: Decreased short-term memory Safety/Judgement: Decreased awareness of safety    Balance   Need to use the bathroom  End of Session PT - End of Session Equipment Utilized During Treatment: Gait belt Activity Tolerance: Patient tolerated treatment well Patient left: in chair;with call bell/phone within reach;with nursing/sitter in room Nurse Communication: Mobility status   GP     [facility] 12/14/2012, 2:28 PM [place], PT [phone] 12/14/2012

## 2012-12-14 NOTE — Progress Notes (Signed)
[facility] PROGRESS NOTE  Ricky [name]. WUJ:[id number] DOB: 19-Apr-1940 DOA: 12/10/2012 PCP: Michiel Sites, MD  Assessment/Plan:  1. Rhabdomyolysis  -due to fall and dehydration -stop IVF -CK improving   2. Mild Leukocytosis  -suspect reactive, afebrile -improving, no obvious soruce of infection, monitor  3. Significant dehydration with elevated lactic acid  -continue IV fluids, lactic acidosis resolved   4. Urinary retention  -1000 mL of fluid removed with in and out cath in ED  -voiding independently now, continue flomax  5. Dysarthria with a history of dysphagia/aspiration-has hx of mild dysarthria  -CT head negative - per patient this was noted 2-3 days ago, but again his mild dementia, not clear how relaible he is - MRI brain negative for CVA - Pt and ST eval completed  7. Uncontrolled diabetes  -Last hemoglobin A1c was 10 (September 2014 and Dr. Marylen Ponto office)  -increase lantus, SSI  8. Recurrent falls  -lives alone, with an aide that comes approximately 4 hours each morning  -PT to evaluate  -needs SNF  9. Schizophrenia with mild dementia  -Controlled on Zyprexa and luvox.   Code Status: DNR (discussed in ED) patient desires full treatment up to intubation and ACLS  Family Communication: Aide and family friend [name] [phone]) at bedside  Disposition Plan: SNF monday   HPI/Subjective: no new complaints  Objective: Filed Vitals:   12/14/12 0500  BP: 132/68  Pulse: 79  Temp: 97.6 F (36.4 C)  Resp: 18    Intake/Output Summary (Last 24 hours) at 12/14/12 0743 Last data filed at 12/13/12 1815  Gross per 24 hour  Intake    840 ml  Output   3800 ml  Net  -2960 ml   Filed Weights   12/10/12 2036 12/11/12 2054  Weight: 108.682 kg (239 lb 9.6 oz) 109.317 kg (241 lb)    Exam:   General: alert, awake, oriented to self, place  Cardiovascular:S1S2/RRR  Respiratory: CTAB  Abdomen: soft, NT, BS  present  Musculoskeletal:trace edema no c/c  Neuro: dysarthria  Skin brises/scabs on face, knee, legs etc  Data Reviewed: Basic Metabolic Panel:  Recent Labs Lab 12/10/12 1214 12/10/12 1856 12/11/12 0530 12/12/12 1045 12/13/12 0533  NA 140  --  140 136 137  K 4.2  --  4.6 4.1 3.8  CL 98  --  102 97 100  CO2  --   --  26 29 28   GLUCOSE 306*  --  267* 337* 266*  BUN 20  --  15 10 11   CREATININE 1.40* 0.95 0.94 0.90 0.93  CALCIUM  --   --  8.9 9.3 9.0   Liver Function Tests: No results found for this basename: AST, ALT, ALKPHOS, BILITOT, PROT, ALBUMIN,  in the last 168 hours No results found for this basename: LIPASE, AMYLASE,  in the last 168 hours No results found for this basename: AMMONIA,  in the last 168 hours CBC:  Recent Labs Lab 12/10/12 1200 12/10/12 1214 12/10/12 1856 12/12/12 1045 12/13/12 0533  WBC 12.0*  --  10.7* 7.0 6.1  NEUTROABS 9.4*  --   --   --   --   HGB 13.5 15.3 14.7 14.2 13.6  HCT 40.5 45.0 43.2 41.7 40.5  MCV 86.4  --  88.2 89.3 89.2  PLT 206  --  192 162 183   Cardiac Enzymes:  Recent Labs Lab 12/10/12 1200 12/11/12 0530 12/12/12 1045 12/13/12 0533  CKTOTAL 1965* 1144* 457* 277*   BNP (last 3  results) No results found for this basename: PROBNP,  in the last 8760 hours CBG:  Recent Labs Lab 12/12/12 1614 12/12/12 2033 12/13/12 0737 12/13/12 1135 12/13/12 1614  GLUCAP 408* 247* 285* 329* 249*    Recent Results (from the past 240 hour(s))  RAPID STREP SCREEN     Status: None   Collection Time    12/10/12  6:14 PM      Result Value Range Status   Streptococcus, Group A Screen (Direct) NEGATIVE  NEGATIVE Final   Comment: (NOTE)     A Rapid Antigen test may result negative if the antigen level in the     sample is below the detection level of this test. The FDA has not     cleared this test as a stand-alone test therefore the rapid antigen     negative result has reflexed to a Group A Strep culture.  MRSA PCR  SCREENING     Status: None   Collection Time    12/10/12  6:15 PM      Result Value Range Status   MRSA by PCR NEGATIVE  NEGATIVE Final   Comment:            The GeneXpert MRSA Assay (FDA     approved for NASAL specimens     only), is one component of a     comprehensive MRSA colonization     surveillance program. It is not     intended to diagnose MRSA     infection nor to guide or     monitor treatment for     MRSA infections.     Studies: Mr Brain Wo Contrast  12/12/2012   CLINICAL DATA:  CVA.  Dysarthria.  EXAM: MRI HEAD WITHOUT CONTRAST  TECHNIQUE: Multiplanar, multiecho pulse sequences of the brain and surrounding structures were obtained without intravenous contrast.  COMPARISON:  CT 11/09/2012  FINDINGS: Negative for acute infarct.  Mild atrophy. Small hyperintensities in the white matter consistent with minimal chronic microvascular ischemia, less than expected for age. Brainstem and cerebellum are normal.  Negative for mass or hemorrhage.  No midline shift.  IMPRESSION: No acute abnormality.   Electronically Signed   By: Marlan Palau M.D.   On: 12/12/2012 18:13    Scheduled Meds: . ALPRAZolam  1 mg Oral QPM  . antiseptic oral rinse  15 mL Mouth Rinse BID  . docusate sodium  100 mg Oral BID  . fluvoxaMINE  100 mg Oral QHS  . heparin  5,000 Units Subcutaneous Q8H  . influenza vac split quadrivalent PF  0.5 mL Intramuscular Tomorrow-1000  . insulin aspart  0-9 Units Subcutaneous TID WC  . insulin glargine  20 Units Subcutaneous QHS  . ketoconazole  1 application Topical BID  . OLANZapine  20 mg Oral QHS  . omega-3 acid ethyl esters  2 g Oral Daily  . simvastatin  20 mg Oral q1800  . tamsulosin  0.4 mg Oral QPC supper   Continuous Infusions:    Principal Problem:   Rhabdomyolysis Active Problems:   Type II or unspecified type diabetes mellitus with neurological manifestations, not stated as uncontrolled(250.60)   Abnormality of gait   Hypertension    Hyperlipidemia   Schizophrenia   Urinary retention   Falls    Time spent:    [facility]  [facility] Pager [phone]. If 7PM-7AM, please contact night-coverage at [email or web address], password [facility] 12/14/2012, 7:43 AM  LOS: 4 days

## 2012-12-14 NOTE — Discharge Summary (Signed)
Physician Discharge Summary  [name] ZOX:[id number] DOB: 1940/12/18 DOA: 12/10/2012  PCP: Michiel Sites, MD  Admit date: 12/10/2012 Discharge date: 12/14/2012  Time spent: 50 minutes  Recommendations for Outpatient Follow-up:  1. PCP in 1 week  Discharge Diagnoses:  Principal Problem:   Rhabdomyolysis   Severe dehydration   Type II or unspecified type diabetes mellitus with neurological manifestations, not stated as uncontrolled(250.60)   Abnormality of gait   Hypertension   Hyperlipidemia   Schizophrenia   Urinary retention   Falls   Discharge Condition:stable  Diet recommendation: diabetic  Filed Weights   12/10/12 2036 12/11/12 2054  Weight: 108.682 kg (239 lb 9.6 oz) 109.317 kg (241 lb)    History of present illness:  PCP: Michiel Sites, MD  Specialists: Dr. Jennelle Human, Psychiatry  Chief Complaint: found down  HPI: Danyel Griess. is a 72 y.o. male with a history of diabetes mellitus, schizophrenia, mild dementia, dysphagia, urinary retention, and hypertension who resides in independent living. He was found down this morning when his caretaker arrived for work. Mr. Meno reports that he got up to go to the bathroom last night and fell as he was trying to get off of the toilet. He dragged himself across the carpet and remained on the floor for the rest of the night. His aide reports that normally Mr. Moradi is up and dressed when he arrives for work. This morning Mr. Weightman was unable to be off the floor. Further the aide mentions that for the past week Mr. Michel Harrow energy level has been reduced, he has been sleeping in more than usual. Mr. [name] a close family friend reports that Mr. Moyers has fallen multiple times in the past year. Mr. Matuszak is unable to recall these falls. Mr. Bluitt is concerned about his elevated CBGs. He is trying to avoid going on insulin.  In the emergency department labs reveal a CK of 1965, lactic acid 2.57, WBC 12.0. Urine  glucose is greater than 1000. CT head and chest x-ray are negative. The patient appears significantly dehydrated with reddened skin over the front of his body, and presumed rug burns on his forehead. In and out cath in the emergency department produced over 1000 mL of urine.   Hospital Course:  1. Rhabdomyolysis  -due to fall and dehydration  -improved, wth hydration -CK improved  2. Mild Leukocytosis  -suspect reactive, afebrile  -resolved,   3. Significant dehydration with elevated lactic acid  -corrected with rehydration  4. Urinary retention  -1000 mL of fluid removed with in and out cath in ED  -voiding independently now, continue flomax   5. Dysarthria with a history of dysphagia/aspiration-has hx of mild dysarthria  -CT head negative  - per patient this was noted 2-3 days ago, but again his mild dementia, not clear how relaible he is and hence MRI was done - MRI brain negative for CVA  - Pt and ST eval completed, SNF recommended  7. Uncontrolled diabetes  -Last hemoglobin A1c was 10 (September 2014 and Dr. Marylen Ponto office)  -started on lantus, SSI   8. Recurrent falls  -lives alone, with an aide that comes approximately 4 hours each morning  -PT evaluation completed -needs SNF   9. Schizophrenia with mild dementia  -Controlled on Zyprexa and luvox.   Code Status: DNR   Discharge Exam: Filed Vitals:   12/14/12 0926  BP: 152/82  Pulse: 84  Temp: 97.2 F (36.2 C)  Resp: 18  General:AAOx3, abrasions on forehead Cardiovascular: S1S2/RRR Respiratory: CTAB  Discharge Instructions  Discharge Orders   Future Orders Complete By Expires   Diet - low sodium heart healthy  As directed    Diet Carb Modified  As directed    Increase activity slowly  As directed        Medication List    STOP taking these medications       celecoxib 200 MG capsule  Commonly known as:  CELEBREX     cephALEXin 500 MG capsule  Commonly known as:  KEFLEX     glimepiride 2  MG tablet  Commonly known as:  AMARYL     Moexipril-Hydrochlorothiazide 15-12.5 MG Tabs      TAKE these medications       ALPRAZolam 0.5 MG tablet  Commonly known as:  XANAX  Take 2 tablets (1 mg total) by mouth every evening.     amLODipine 5 MG tablet  Commonly known as:  NORVASC  Take 5 mg by mouth daily.     CITRACAL + D PO  Take 1 tablet by mouth daily.     fluvoxaMINE 100 MG tablet  Commonly known as:  LUVOX  Take 100 mg by mouth at bedtime.     furosemide 20 MG tablet  Commonly known as:  LASIX  Take 20 mg by mouth daily before breakfast.     GLUCOSAMINE-CHONDROITIN PO  Take 1 tablet by mouth daily.     hydrOXYzine 25 MG capsule  Commonly known as:  VISTARIL  Take 25 mg by mouth daily.     hydrOXYzine 25 MG capsule  Commonly known as:  VISTARIL  Take 25-50 mg by mouth at bedtime as needed (for sleep).     insulin glargine 100 UNIT/ML injection  Commonly known as:  LANTUS  Inject 0.15 mLs (15 Units total) into the skin at bedtime.     ketoconazole 2 % cream  Commonly known as:  NIZORAL  Apply 1 application topically 2 (two) times daily.     metFORMIN 500 MG 24 hr tablet  Commonly known as:  GLUCOPHAGE-XR  Take 500 mg by mouth 2 (two) times daily.     OLANZapine 20 MG tablet  Commonly known as:  ZYPREXA  Take 20 mg by mouth at bedtime.     Omega 3 1200 MG Caps  Take 2 capsules by mouth daily.     omeprazole 40 MG capsule  Commonly known as:  PRILOSEC  Take 40 mg by mouth daily.     ONE TOUCH ULTRA TEST test strip  Generic drug:  glucose blood     ONGLYZA 5 MG Tabs tablet  Generic drug:  saxagliptin HCl  Take 5 mg by mouth daily before breakfast.     pravastatin 40 MG tablet  Commonly known as:  PRAVACHOL  Take 40 mg by mouth daily after lunch.     SSD 1 % cream  Generic drug:  silver sulfADIAZINE  Apply 1 application topically 3 (three) times daily.       Allergies  Allergen Reactions  . Asa [Aspirin]     "Dr told me not to take  it"  . Codeine Other (See Comments)    DIZZINESS with Tylenol 3  . Tylenol [Acetaminophen] Other (See Comments)    Dizziness with tylenol 3      The results of significant diagnostics from this hospitalization (including imaging, microbiology, ancillary and laboratory) are listed below for reference.    Significant Diagnostic Studies: Dg  Chest 2 View  12/10/2012   CLINICAL DATA:  Loss of consciousness.  EXAM: CHEST  2 VIEW  COMPARISON:  11/07/2011.  FINDINGS: Mediastinum hilar structures normal. Lungs are clear. Cardiomegaly, no CHF. No pleural effusion or pneumothorax. Degenerative changes thoracic spine.  IMPRESSION: No active cardiopulmonary disease.   Electronically Signed   By: Maisie Fus  Register   On: 12/10/2012 12:35   Ct Head Wo Contrast  12/10/2012   CLINICAL DATA:  Fall.  EXAM: CT HEAD WITHOUT CONTRAST  TECHNIQUE: Contiguous axial images were obtained from the base of the skull through the vertex without intravenous contrast.  COMPARISON:  None.  FINDINGS: Ventricle size is normal. Age-appropriate atrophy. Negative for acute infarct. Negative for hemorrhage or mass. No skull fracture.  IMPRESSION: No acute abnormality.   Electronically Signed   By: Marlan Palau M.D.   On: 12/10/2012 12:42   Mr Brain Wo Contrast  12/12/2012   CLINICAL DATA:  CVA.  Dysarthria.  EXAM: MRI HEAD WITHOUT CONTRAST  TECHNIQUE: Multiplanar, multiecho pulse sequences of the brain and surrounding structures were obtained without intravenous contrast.  COMPARISON:  CT 11/09/2012  FINDINGS: Negative for acute infarct.  Mild atrophy. Small hyperintensities in the white matter consistent with minimal chronic microvascular ischemia, less than expected for age. Brainstem and cerebellum are normal.  Negative for mass or hemorrhage.  No midline shift.  IMPRESSION: No acute abnormality.   Electronically Signed   By: Marlan Palau M.D.   On: 12/12/2012 18:13    Microbiology: Recent Results (from the past 240 hour(s))   RAPID STREP SCREEN     Status: None   Collection Time    12/10/12  6:14 PM      Result Value Range Status   Streptococcus, Group A Screen (Direct) NEGATIVE  NEGATIVE Final   Comment: (NOTE)     A Rapid Antigen test may result negative if the antigen level in the     sample is below the detection level of this test. The FDA has not     cleared this test as a stand-alone test therefore the rapid antigen     negative result has reflexed to a Group A Strep culture.  MRSA PCR SCREENING     Status: None   Collection Time    12/10/12  6:15 PM      Result Value Range Status   MRSA by PCR NEGATIVE  NEGATIVE Final   Comment:            The GeneXpert MRSA Assay (FDA     approved for NASAL specimens     only), is one component of a     comprehensive MRSA colonization     surveillance program. It is not     intended to diagnose MRSA     infection nor to guide or     monitor treatment for     MRSA infections.     Labs: Basic Metabolic Panel:  Recent Labs Lab 12/10/12 1214 12/10/12 1856 12/11/12 0530 12/12/12 1045 12/13/12 0533  NA 140  --  140 136 137  K 4.2  --  4.6 4.1 3.8  CL 98  --  102 97 100  CO2  --   --  26 29 28   GLUCOSE 306*  --  267* 337* 266*  BUN 20  --  15 10 11   CREATININE 1.40* 0.95 0.94 0.90 0.93  CALCIUM  --   --  8.9 9.3 9.0   Liver Function Tests: No results  found for this basename: AST, ALT, ALKPHOS, BILITOT, PROT, ALBUMIN,  in the last 168 hours No results found for this basename: LIPASE, AMYLASE,  in the last 168 hours No results found for this basename: AMMONIA,  in the last 168 hours CBC:  Recent Labs Lab 12/10/12 1200 12/10/12 1214 12/10/12 1856 12/12/12 1045 12/13/12 0533  WBC 12.0*  --  10.7* 7.0 6.1  NEUTROABS 9.4*  --   --   --   --   HGB 13.5 15.3 14.7 14.2 13.6  HCT 40.5 45.0 43.2 41.7 40.5  MCV 86.4  --  88.2 89.3 89.2  PLT 206  --  192 162 183   Cardiac Enzymes:  Recent Labs Lab 12/10/12 1200 12/11/12 0530 12/12/12 1045  12/13/12 0533  CKTOTAL 1965* 1144* 457* 277*   BNP: BNP (last 3 results) No results found for this basename: PROBNP,  in the last 8760 hours CBG:  Recent Labs Lab 12/13/12 0737 12/13/12 1135 12/13/12 1614 12/13/12 2110 12/14/12 0739  GLUCAP 285* 329* 249* 297* 269*       Signed:  Paisely Brick  [facility] 12/14/2012, 11:16 AM

## 2012-12-14 NOTE — Progress Notes (Signed)
12/14/2012 1145 Plan is to dc to SNF. CSW following for placement. Isidoro Donning RN CCM Case Mgmt phone [phone]

## 2012-12-14 NOTE — Evaluation (Signed)
Occupational Therapy Evaluation and Discharge Patient Details Name: Ricky [name]. MRN: [id number] DOB: 1940-06-20 Today's Date: 12/14/2012 Time: 4098-1191 OT Time Calculation (min): 40 min  OT Assessment / Plan / Recommendation History of present illness Pt is a 72 y.o. male found on the floor of his apartment at [employer]. Pt with elevated CK levels in ED; being treated for rhabdomyolysis. PMH includes DM, Dementia,    Clinical Impression   This 72 yo male admitted with above presents to acute OT with deficits below as well as some not so safe transfers practices at home PTA, will benefit from continued OT at [facility]. Acute OT will defer remainder of OT to that facility and pt is in agreement.    OT Assessment  All further OT needs can be met in the next venue of care    Follow Up Recommendations  SNF    Barriers to Discharge Decreased caregiver support    Equipment Recommendations   (TBD at next venue)          Precautions / Restrictions Precautions Precautions: Fall Precaution Comments: per chart and emergency contact pt has experienced multiple falls recently  Restrictions Weight Bearing Restrictions: No       ADL  Eating/Feeding: Independent Where Assessed - Eating/Feeding: Chair Grooming: Set up Where Assessed - Grooming: Unsupported sitting Upper Body Bathing: Set up Where Assessed - Upper Body Bathing: Unsupported sitting Lower Body Bathing: Minimal assistance Where Assessed - Lower Body Bathing: Lean right and/or left;Unsupported sitting Upper Body Dressing: Set up Where Assessed - Upper Body Dressing: Unsupported sitting Lower Body Dressing: Moderate assistance Where Assessed - Lower Body Dressing: Lean right and/or left;Unsupported sitting Toilet Transfer: Minimal assistance Toilet Transfer Method: Squat pivot (lateral transfer with even surfaces bed to drop arm recliner) Equipment Used: Gait belt Transfers/Ambulation Related to ADLs: See toliet  transfer above    OT Diagnosis: Generalized weakness  OT Problem List: Impaired balance (sitting and/or standing);Decreased knowledge of use of DME or AE;Obesity;Decreased safety awareness    Acute Rehab OT Goals Patient Stated Goal: to go to SNF and then back to [facility]  Visit Information  Last OT Received On: 12/14/12 Assistance Needed: +1 History of Present Illness: Pt is a 72 y.o. male found on the floor of his apartment at [employer]. Pt with elevated CK levels in ED; being treated for rhabdomyolysis. PMH includes DM, Dementia,        Prior Functioning     Home Living Family/patient expects to be discharged to:: Private residence Living Arrangements: Alone Additional Comments: [facility]--they do provide his meals Prior Function Level of Independence: Needs assistance Gait / Transfers Assistance Needed: per pt and family friend, pt was able to transfer from W/C down to his knees use the urinal then use his arms to lift himself back up into the W/C--also able to do this to get out of his W/C and up on toliet. Pt's Home instead caregiver stated that therapists had worked with him on lateral transfers and he did these for a while but then resorted back to down on the floor transfers between surfaces. Pts main mode of mobility is at a wheelchair level ADL's / Homemaking Assistance Needed: has caregiver 7 days a week/3 hours a day to (A) with IADL's  Comments: pt is a poor historian and friend has difficulty providing full PLOF--and will say he has problems with his memory Communication Communication: No difficulties Dominant Hand: Right  Vision/Perception Vision - History Patient Visual Report: No change from baseline   Cognition  Cognition Arousal/Alertness: Awake/alert Behavior During Therapy: Impulsive Overall Cognitive Status: Impaired/Different from baseline Memory: Decreased short-term memory Safety/Judgement: Decreased awareness  of safety    Extremity/Trunk Assessment Upper Extremity Assessment Upper Extremity Assessment: Overall WFL for tasks assessed (5/5 with testing for triceps and biceps)     Mobility Bed Mobility Bed Mobility: Supine to Sit;Sitting - Scoot to Edge of Bed Supine to Sit: 4: Min guard;With rails;HOB elevated Sitting - Scoot to Edge of Bed: 4: Min guard Details for Bed Mobility Assistance: Pt did this with momentum           End of Session OT - End of Session Equipment Utilized During Treatment: Gait belt Activity Tolerance: Patient tolerated treatment well Patient left: in chair;with call bell/phone within reach (PCA worker in room)       Evette Georges [phone] 12/14/2012, 2:02 PM

## 2012-12-14 NOTE — Clinical Social Work Placement (Signed)
Clinical Social Work Department CLINICAL SOCIAL WORK PLACEMENT NOTE 12/14/2012  Patient:  Ricky [name], Ricky [name]  Account Number:  [id number] Admit date:  12/10/2012  Clinical Social Worker:  Genelle Bal, LCSW  Date/time:  12/14/2012 03:05 AM  Clinical Social Work is seeking post-discharge placement for this patient at the following level of care:   SKILLED NURSING   (*CSW will update this form in Epic as items are completed)     Patient/family provided with [facility] Department of Clinical Social Work's list of facilities offering this level of care within the geographic area requested by the patient (or if unable, by the patient's family).  12/14/2012  Patient/family informed of their freedom to choose among providers that offer the needed level of care, that participate in Medicare, Medicaid or managed care program needed by the patient, have an available bed and are willing to accept the patient.    Patient/family informed of [facility]' ownership interest in [facility], as well as of the fact that they are under no obligation to receive care at this facility.  PASARR submitted to EDS on 12/14/2012 PASARR number received from EDS on 12/14/2012  FL2 transmitted to all facilities in geographic area requested by pt/family on  12/14/2012 FL2 transmitted to all facilities within larger geographic area on   Patient informed that his/her managed care company has contracts with or will negotiate with  certain facilities, including the following:     Patient/family informed of bed offers received:  12/14/2012 Patient chooses bed at [facility] AND [facility] Physician recommends and patient chooses bed at    Patient to be transferred to [facility] REHAB on  12/14/2012 Patient to be transferred to facility by ambulance  The following physician request were entered in Epic:   Additional Comments:

## 2012-12-14 NOTE — Progress Notes (Signed)
Patient discharged to [facility]. Patient AVS faxed to facility. Report called to Rosalita Chessman, Charity fundraiser. Patient remains stable; no signs or symptoms of distress.  Patient transported via EMS. Patient's belongings transported with patient.

## 2012-12-14 NOTE — Clinical Social Work Note (Signed)
CSW and RN case manager Ricky [name] visited room and talked with patient regarding discharge plans and the recommendation of SNF for ST rehab. Present in the room was the occupational therapist and patient's caregiver Ricky [name]. The occupational therapist was able to help patient understand the important of therapy and his caregiver offered valuable information regarding patient's abilities at home. Ricky [name] is in agreement and his preference is [name]. CSW informed that contact will be made with [name] to determine if they can offer him a bed today as he is medically ready for d/c.  CSW visited with patient and caregiver a short while later and advised him that [name] can take him today. A call was made to patient's attorney Ricky [name] [phone]) regarding completing patient's admission's paperwork at facility and he is able to do so.    Ricky [name], MSW, LCSW [phone]

## 2012-12-15 LAB — GLUCOSE, CAPILLARY: Glucose-Capillary: 355 mg/dL — ABNORMAL HIGH (ref 70–99)

## 2014-03-19 DIAGNOSIS — N390 Urinary tract infection, site not specified: Secondary | ICD-10-CM | POA: Diagnosis not present

## 2014-03-19 DIAGNOSIS — R319 Hematuria, unspecified: Secondary | ICD-10-CM | POA: Diagnosis not present

## 2014-03-23 DIAGNOSIS — E118 Type 2 diabetes mellitus with unspecified complications: Secondary | ICD-10-CM | POA: Diagnosis not present

## 2014-03-24 DIAGNOSIS — M545 Low back pain: Secondary | ICD-10-CM | POA: Diagnosis not present

## 2014-03-24 DIAGNOSIS — M5136 Other intervertebral disc degeneration, lumbar region: Secondary | ICD-10-CM | POA: Diagnosis not present

## 2014-03-24 DIAGNOSIS — M546 Pain in thoracic spine: Secondary | ICD-10-CM | POA: Diagnosis not present

## 2014-03-27 ENCOUNTER — Encounter (HOSPITAL_COMMUNITY): Payer: Self-pay | Admitting: Emergency Medicine

## 2014-03-27 ENCOUNTER — Emergency Department (HOSPITAL_COMMUNITY): Payer: Medicare Other

## 2014-03-27 ENCOUNTER — Emergency Department (HOSPITAL_COMMUNITY)
Admission: EM | Admit: 2014-03-27 | Discharge: 2014-03-27 | Disposition: A | Discharge status: Home / Self Care | Payer: Medicare Other | Attending: Emergency Medicine | Admitting: Emergency Medicine

## 2014-03-27 DIAGNOSIS — R05 Cough: Secondary | ICD-10-CM

## 2014-03-27 DIAGNOSIS — Z79899 Other long term (current) drug therapy: Secondary | ICD-10-CM | POA: Diagnosis not present

## 2014-03-27 DIAGNOSIS — E119 Type 2 diabetes mellitus without complications: Secondary | ICD-10-CM | POA: Insufficient documentation

## 2014-03-27 DIAGNOSIS — Z8669 Personal history of other diseases of the nervous system and sense organs: Secondary | ICD-10-CM | POA: Insufficient documentation

## 2014-03-27 DIAGNOSIS — E785 Hyperlipidemia, unspecified: Secondary | ICD-10-CM | POA: Insufficient documentation

## 2014-03-27 DIAGNOSIS — Z794 Long term (current) use of insulin: Secondary | ICD-10-CM | POA: Diagnosis not present

## 2014-03-27 DIAGNOSIS — J189 Pneumonia, unspecified organism: Secondary | ICD-10-CM

## 2014-03-27 DIAGNOSIS — Z86010 Personal history of colonic polyps: Secondary | ICD-10-CM | POA: Diagnosis not present

## 2014-03-27 DIAGNOSIS — Z8719 Personal history of other diseases of the digestive system: Secondary | ICD-10-CM | POA: Insufficient documentation

## 2014-03-27 DIAGNOSIS — F209 Schizophrenia, unspecified: Secondary | ICD-10-CM | POA: Diagnosis not present

## 2014-03-27 DIAGNOSIS — J159 Unspecified bacterial pneumonia: Secondary | ICD-10-CM | POA: Insufficient documentation

## 2014-03-27 DIAGNOSIS — I10 Essential (primary) hypertension: Secondary | ICD-10-CM | POA: Insufficient documentation

## 2014-03-27 DIAGNOSIS — R079 Chest pain, unspecified: Secondary | ICD-10-CM | POA: Diagnosis not present

## 2014-03-27 DIAGNOSIS — F329 Major depressive disorder, single episode, unspecified: Secondary | ICD-10-CM | POA: Insufficient documentation

## 2014-03-27 DIAGNOSIS — F0390 Unspecified dementia without behavioral disturbance: Secondary | ICD-10-CM | POA: Diagnosis not present

## 2014-03-27 DIAGNOSIS — R059 Cough, unspecified: Secondary | ICD-10-CM

## 2014-03-27 LAB — BASIC METABOLIC PANEL
Anion gap: 9 (ref 5–15)
BUN: 10 mg/dL (ref 6–23)
CO2: 30 mmol/L (ref 19–32)
CREATININE: 0.8 mg/dL (ref 0.50–1.35)
Calcium: 9.4 mg/dL (ref 8.4–10.5)
Chloride: 96 mmol/L (ref 96–112)
GFR calc Af Amer: >90 mL/min (ref >90)
GFR calc non Af Amer: 86 mL/min — ABNORMAL LOW (ref >90)
GLUCOSE: 182 mg/dL — AB (ref 70–99)
POTASSIUM: 3.8 mmol/L (ref 3.5–5.1)
SODIUM: 135 mmol/L (ref 135–145)

## 2014-03-27 LAB — CBC WITH DIFFERENTIAL/PLATELET
BASOS PCT: 0 % (ref 0–1)
Basophils Absolute: 0 10*3/uL (ref 0.0–0.1)
EOS ABS: 0.1 10*3/uL (ref 0.0–0.7)
EOS PCT: 1 % (ref 0–5)
HCT: 35.2 % — ABNORMAL LOW (ref 39.0–52.0)
Hemoglobin: 11.4 g/dL — ABNORMAL LOW (ref 13.0–17.0)
LYMPHS ABS: 1.9 10*3/uL (ref 0.7–4.0)
Lymphocytes Relative: 20 % (ref 12–46)
MCH: 29.2 pg (ref 26.0–34.0)
MCHC: 32.4 g/dL (ref 30.0–36.0)
MCV: 90 fL (ref 78.0–100.0)
Monocytes Absolute: 0.7 10*3/uL (ref 0.1–1.0)
Monocytes Relative: 8 % (ref 3–12)
NEUTROS PCT: 71 % (ref 43–77)
Neutro Abs: 6.7 10*3/uL (ref 1.7–7.7)
Platelets: 350 10*3/uL (ref 150–400)
RBC: 3.91 MIL/uL — ABNORMAL LOW (ref 4.22–5.81)
RDW: 13.6 % (ref 11.5–15.5)
WBC: 9.4 10*3/uL (ref 4.0–10.5)

## 2014-03-27 LAB — I-STAT TROPONIN, ED: TROPONIN I, POC: 0 ng/mL (ref 0.00–0.08)

## 2014-03-27 LAB — BRAIN NATRIURETIC PEPTIDE: B NATRIURETIC PEPTIDE 5: 35.1 pg/mL (ref 0.0–100.0)

## 2014-03-27 MED ORDER — ACETAMINOPHEN 325 MG PO TABS
650.0000 mg | ORAL_TABLET | Freq: Once | ORAL | Status: AC
  Administered 2014-03-27: 650 mg via ORAL
  Filled 2014-03-27: qty 2

## 2014-03-27 MED ORDER — LEVOFLOXACIN 750 MG PO TABS: 750.0000 mg | ORAL_TABLET | Freq: Every day | ORAL | Status: DC

## 2014-03-27 MED ORDER — LEVOFLOXACIN IN D5W 500 MG/100ML IV SOLN
500.0000 mg | Freq: Once | INTRAVENOUS | Status: AC
  Administered 2014-03-27: 500 mg via INTRAVENOUS
  Filled 2014-03-27: qty 100

## 2014-03-27 NOTE — ED Notes (Signed)
Phlebotomy at bedside to attempt lab draw ?

## 2014-03-27 NOTE — Discharge Instructions (Signed)
Take the prescribed medication as directed. °Follow-up with your primary care physician. °Return to the ED for new or worsening symptoms. ° °

## 2014-03-27 NOTE — ED Notes (Signed)
Awake. Verbally responsive. A/O x4. Resp even and unlabored. No audible adventitious breath sounds noted. ABC's intact. SR on monitor. 

## 2014-03-27 NOTE — ED Notes (Signed)
Phlebotomy unable to collect labs.

## 2014-03-27 NOTE — ED Notes (Addendum)
Attempted to call report to staff but no one would answer the phone after 5 minutes of being on hold. Caregiver that transported pt was provided with d/c instructions for pt after care.

## 2014-03-27 NOTE — ED Notes (Signed)
Gave pt diet coke per request and advised PA that pt IV abx are complete

## 2014-03-27 NOTE — ED Provider Notes (Signed)
Medical screening examination/treatment/procedure(s) were conducted as a shared visit with non-physician practitioner(s) and myself.  I personally evaluated the patient during the encounter.   EKG Interpretation   Date/Time:  Sunday March 27 2014 06:12:15 EST Ventricular Rate:  95 PR Interval:  228 QRS Duration: 93 QT Interval:  349 QTC Calculation: 439 R Axis:   43 Text Interpretation:  Age not entered, assumed to be  74 years old for  purpose of ECG interpretation Sinus rhythm Prolonged PR interval Inferior  infarct, old No significant change since last tracing Confirmed by Sukari Grist   MD, Aliciana Ricciardi ([id number]) on 03/27/2014 8:28:56 AM      Patient here complaining of cough 1 week. Denies any exertional dyspnea. No anginal chest pain. Has had increased temperature as well 2 without urinary symptoms. Chest x-ray with likely early pneumonia. Patient does not want to be admitted at this time and patient be treated for suspected pulmonary infection.  Leota Jacobsen, MD 03/27/14 [phone]

## 2014-03-27 NOTE — ED Notes (Signed)
Pt arrived via EMS with c/o nonproductive cough x1 week and chest pain with cough. Denies SOB/dyspnea. Pt stated that he has a number of things going on.

## 2014-03-27 NOTE — ED Notes (Signed)
Attempted to draw labs from IV w/o success. Phlebotomy at bedside

## 2014-03-27 NOTE — ED Notes (Signed)
Pt called and wanted pants on. Pt was assisted with request

## 2014-03-27 NOTE — ED Notes (Signed)
Bed: KC12 Expected date:  Expected time:  Means of arrival:  Comments: EMS 72 cough/fever

## 2014-03-27 NOTE — ED Provider Notes (Signed)
CSN: [id number]     Arrival date & time 03/27/14  0603 History   First MD Initiated Contact with Patient 03/27/14 [phone]     Chief Complaint  Patient presents with  . Cough     (Consider location/radiation/quality/duration/timing/severity/associated sxs/prior Treatment) The history is provided by the patient and medical records.    Ricky [name] is a 74 y/o male with a history of diabetes and hypertension who presents today with a complaint of nonproductive cough. The cough began 1 week ago. He endorses chest pain "above his heart" when he coughs. He lives in a nursing home and states that he is around many people who are sick. He states he has a history of food aspiration. He denies smoking and history of COPD or asthma. He denies fever, chills, discharge/pain from eyes, ear discharge/pain, sore throat, shortness of breath, nausea/vomiting, and changes in bowel habits. Also denies hemoptysis, recent travel, recent surgeries, pleuritic chest pain, or hx of cancer.  Low grade temp noted on arrival.  Past Medical History  Diagnosis Date  . Diabetes mellitus without complication   . Hypertension   . Dementia   . Depression   . Arthritis   . Hyperlipidemia   . Cataract   . Urinary frequency   . Fatty liver   . Schizophrenia   . Peripheral edema   . Internal hemorrhoids   . Adenomatous colon polyp   . Type II or unspecified type diabetes mellitus without mention of complication, not stated as uncontrolled   . Abnormality of gait   . Other malaise and fatigue    Past Surgical History  Procedure Laterality Date  . Tonsillectomy    . Esophagogastroduodenoscopy  02/21/2012    Procedure: ESOPHAGOGASTRODUODENOSCOPY (EGD);  Surgeon: Beryle Beams, MD;  Location: [facility];  Service: Endoscopy;  Laterality: N/A;  . Esophagogastroduodenoscopy N/A 05/22/2012    Procedure: ESOPHAGOGASTRODUODENOSCOPY (EGD);  Surgeon: Beryle Beams, MD;  Location: [facility];  Service: Endoscopy;  Laterality: N/A;   . Balloon dilation N/A 05/22/2012    Procedure: BALLOON DILATION;  Surgeon: Beryle Beams, MD;  Location: [facility];  Service: Endoscopy;  Laterality: N/A;   Family History  Problem Relation Age of Onset  . Brain cancer Father    History  Substance Use Topics  . Smoking status: Never Smoker   . Smokeless tobacco: Never Used  . Alcohol Use: No    Review of Systems  Respiratory: Positive for cough.   Cardiovascular: Positive for chest pain.  All other systems reviewed and are negative.     Allergies  Asa; Codeine; and Tylenol  Home Medications   Prior to Admission medications   Medication Sig Start Date End Date Taking? Authorizing Provider  ALPRAZolam Duanne Moron) 0.5 MG tablet Take 2 tablets (1 mg total) by mouth every evening. 12/14/12   Domenic Polite, MD  amLODipine (NORVASC) 5 MG tablet Take 5 mg by mouth daily. 09/04/12   Historical Provider, MD  Calcium Citrate-Vitamin D (CITRACAL + D PO) Take 1 tablet by mouth daily.    Historical Provider, MD  fluvoxaMINE (LUVOX) 100 MG tablet Take 100 mg by mouth at bedtime.  12/12/11   Historical Provider, MD  furosemide (LASIX) 20 MG tablet Take 20 mg by mouth daily before breakfast.  12/07/11   Historical Provider, MD  GLUCOSAMINE-CHONDROITIN PO Take 1 tablet by mouth daily.    Historical Provider, MD  hydrOXYzine (VISTARIL) 25 MG capsule Take 25 mg by mouth daily. 07/01/12   Historical Provider, MD  hydrOXYzine (VISTARIL) 25 MG capsule Take 25-50 mg by mouth at bedtime as needed (for sleep).    Historical Provider, MD  insulin glargine (LANTUS) 100 UNIT/ML injection Inject 0.15 mLs (15 Units total) into the skin at bedtime. 12/14/12   Domenic Polite, MD  ketoconazole (NIZORAL) 2 % cream Apply 1 application topically 2 (two) times daily. 12/09/12   Historical Provider, MD  metFORMIN (GLUCOPHAGE-XR) 500 MG 24 hr tablet Take 500 mg by mouth 2 (two) times daily. 09/30/12   Historical Provider, MD  OLANZapine (ZYPREXA) 20 MG tablet Take 20  mg by mouth at bedtime.    Historical Provider, MD  Omega 3 1200 MG CAPS Take 2 capsules by mouth daily.    Historical Provider, MD  omeprazole (PRILOSEC) 40 MG capsule Take 40 mg by mouth daily. 09/28/12   Historical Provider, MD  ONE TOUCH ULTRA TEST test strip  06/22/12   Historical Provider, MD  ONGLYZA 5 MG TABS tablet Take 5 mg by mouth daily before breakfast.  12/06/11   Historical Provider, MD  pravastatin (PRAVACHOL) 40 MG tablet Take 40 mg by mouth daily after lunch.  12/24/11   Historical Provider, MD  SSD 1 % cream Apply 1 application topically 3 (three) times daily.  10/08/12   Historical Provider, MD   BP 120/76 mmHg  Pulse 99  Temp(Src) 99.2 F (37.3 C) (Oral)  Resp 14  SpO2 92%   Physical Exam  Constitutional: He is oriented to person, place, and time. He appears well-developed and well-nourished. No distress.  HENT:  Head: Normocephalic and atraumatic.  Mouth/Throat: Oropharynx is clear and moist.  Eyes: Conjunctivae and EOM are normal. Pupils are equal, round, and reactive to light.  Neck: Normal range of motion. Neck supple.  Cardiovascular: Normal rate, regular rhythm and normal heart sounds.   Pulmonary/Chest: Effort normal and breath sounds normal. No respiratory distress. He has no wheezes. He has no rales.  Respirations unlabored, no audible wheezes, rhonchi, or rales  Abdominal: Soft. Bowel sounds are normal. There is no tenderness. There is no guarding.  Musculoskeletal: Normal range of motion.  Neurological: He is alert and oriented to person, place, and time.  Skin: Skin is warm and dry. He is not diaphoretic.  Psychiatric: He has a normal mood and affect.  Nursing note and vitals reviewed.   ED Course  Procedures (including critical care time) Labs Review Labs Reviewed  CBC WITH DIFFERENTIAL/PLATELET - Abnormal; Notable for the following:    RBC 3.91 (*)    Hemoglobin 11.4 (*)    HCT 35.2 (*)    All other components within normal limits  BASIC  METABOLIC PANEL - Abnormal; Notable for the following:    Glucose, Bld 182 (*)    GFR calc non Af Amer 86 (*)    All other components within normal limits  BRAIN NATRIURETIC PEPTIDE  I-STAT TROPOININ, ED    Imaging Review Dg Chest 2 View  03/27/2014   CLINICAL DATA:  Cough and chest congestion for several weeks. Initial encounter.  EXAM: CHEST  2 VIEW  COMPARISON:  12/10/2012.  FINDINGS: The cardiopericardial silhouette is within normal limits for size. Tortuous aorta up. Aortic arch atherosclerosis. Monitoring leads project over the chest.  New blunting of the LEFT costophrenic angle is present on the frontal view. This is most compatible with atelectasis. No effusion is identified on the lateral view. Thoracic kyphosis is present. Chronic RIGHT rotator cuff tear. No airspace consolidation.  IMPRESSION: LEFT basilar atelectasis.  No acute  cardiopulmonary disease.   Electronically Signed   By: Dereck Ligas M.D.   On: 03/27/2014 07:46     EKG Interpretation   Date/Time:  Sunday March 27 2014 06:12:15 EST Ventricular Rate:  95 PR Interval:  228 QRS Duration: 93 QT Interval:  349 QTC Calculation: 439 R Axis:   43 Text Interpretation:  Age not entered, assumed to be  74 years old for  purpose of ECG interpretation Sinus rhythm Prolonged PR interval Inferior  infarct, old No significant change since last tracing Confirmed by ALLEN   MD, ANTHONY ([id number]) on 03/27/2014 8:28:56 AM      MDM   Final diagnoses:  Cough   74 year old male from SNF here with cough.  He endorses chest pain with coughing only, no SOB.  No cardiac hx.  On exam, patient with low-grade fever but nontoxic in appearance. His lungs are clear bilaterally and he is in no acute respiratory distress.  Will obtain basic labs, troponin, chest x-ray, EKG.  Lab work overall reassuring.  Chest x-ray without acute infiltrate, atelectasis noted. EKG without acute ischemic changes. Patient with low-grade fever, productive cough  from SNF with multiple sick contacts.  He does have history of aspiration pneumonia. Feel that clinically he has HCAP.  While the emergency department patient has had a few desaturations down to 90-92%, he is asymptomatic with this. I discussed possibility of admission to the hospital, however patient would like to be discharged by to his facility. He was given a dose of IV Levaquin here in the ED and discharged home with the same. Encouraged close follow-up with PCP.  Discussed plan with patient, he/she acknowledged understanding and agreed with plan of care.  Return precautions given for new or worsening symptoms.  Case discussed with attending physician, Dr. Zenia Resides, who evaluated patient and agrees with assessment/plan of care.  Larene Pickett, PA-C 03/27/14 1313

## 2014-03-29 DIAGNOSIS — J189 Pneumonia, unspecified organism: Secondary | ICD-10-CM | POA: Diagnosis not present

## 2014-03-29 DIAGNOSIS — I10 Essential (primary) hypertension: Secondary | ICD-10-CM | POA: Diagnosis not present

## 2014-03-29 DIAGNOSIS — E789 Disorder of lipoprotein metabolism, unspecified: Secondary | ICD-10-CM | POA: Diagnosis not present

## 2014-03-29 DIAGNOSIS — J90 Pleural effusion, not elsewhere classified: Secondary | ICD-10-CM | POA: Diagnosis not present

## 2014-03-29 DIAGNOSIS — E118 Type 2 diabetes mellitus with unspecified complications: Secondary | ICD-10-CM | POA: Diagnosis not present

## 2014-04-21 DIAGNOSIS — M546 Pain in thoracic spine: Secondary | ICD-10-CM | POA: Diagnosis not present

## 2014-04-21 DIAGNOSIS — M5136 Other intervertebral disc degeneration, lumbar region: Secondary | ICD-10-CM | POA: Diagnosis not present

## 2014-04-21 DIAGNOSIS — M545 Low back pain: Secondary | ICD-10-CM | POA: Diagnosis not present

## 2014-04-29 DIAGNOSIS — H40013 Open angle with borderline findings, low risk, bilateral: Secondary | ICD-10-CM | POA: Diagnosis not present

## 2014-06-03 DIAGNOSIS — Z79899 Other long term (current) drug therapy: Secondary | ICD-10-CM | POA: Diagnosis not present

## 2014-06-03 DIAGNOSIS — I10 Essential (primary) hypertension: Secondary | ICD-10-CM | POA: Diagnosis not present

## 2014-06-03 DIAGNOSIS — D649 Anemia, unspecified: Secondary | ICD-10-CM | POA: Diagnosis not present

## 2014-06-03 DIAGNOSIS — E785 Hyperlipidemia, unspecified: Secondary | ICD-10-CM | POA: Diagnosis not present

## 2014-06-09 DIAGNOSIS — F251 Schizoaffective disorder, depressive type: Secondary | ICD-10-CM | POA: Diagnosis not present

## 2014-06-09 DIAGNOSIS — K219 Gastro-esophageal reflux disease without esophagitis: Secondary | ICD-10-CM | POA: Diagnosis not present

## 2014-06-09 DIAGNOSIS — E119 Type 2 diabetes mellitus without complications: Secondary | ICD-10-CM | POA: Diagnosis not present

## 2014-06-09 DIAGNOSIS — R1310 Dysphagia, unspecified: Secondary | ICD-10-CM | POA: Diagnosis not present

## 2014-06-09 DIAGNOSIS — R2689 Other abnormalities of gait and mobility: Secondary | ICD-10-CM | POA: Diagnosis not present

## 2014-06-15 ENCOUNTER — Other Ambulatory Visit: Payer: Self-pay | Admitting: Gastroenterology

## 2014-06-15 DIAGNOSIS — K2270 Barrett's esophagus without dysplasia: Secondary | ICD-10-CM | POA: Diagnosis not present

## 2014-06-15 DIAGNOSIS — K449 Diaphragmatic hernia without obstruction or gangrene: Secondary | ICD-10-CM | POA: Diagnosis not present

## 2014-06-15 DIAGNOSIS — K222 Esophageal obstruction: Secondary | ICD-10-CM | POA: Diagnosis not present

## 2014-06-15 DIAGNOSIS — R1310 Dysphagia, unspecified: Secondary | ICD-10-CM | POA: Diagnosis not present

## 2014-06-16 DIAGNOSIS — H268 Other specified cataract: Secondary | ICD-10-CM | POA: Diagnosis not present

## 2014-06-16 DIAGNOSIS — H40013 Open angle with borderline findings, low risk, bilateral: Secondary | ICD-10-CM | POA: Diagnosis not present

## 2014-06-16 DIAGNOSIS — H43813 Vitreous degeneration, bilateral: Secondary | ICD-10-CM | POA: Diagnosis not present

## 2014-06-16 DIAGNOSIS — E119 Type 2 diabetes mellitus without complications: Secondary | ICD-10-CM | POA: Diagnosis not present

## 2014-06-16 DIAGNOSIS — I708 Atherosclerosis of other arteries: Secondary | ICD-10-CM | POA: Diagnosis not present

## 2014-06-16 NOTE — H&P (Signed)
  [facility] HPI: He started to have issues with dysphagia two months ago. He reports that it is a solid food predominance. In 01/3012 and 05/2012 he underwent endoscopies. The first endoscopy was performed for dysphagia and it revealed a pan Barrett's esophagus, hiatal hernia, a mild distal esophageal stricture, which was dilated with a 16 and 17 mm Savary dilator. A repeat EGD was performed for recurrent complaints of dysphagia and he was dilated upwards to 17 mm with good effect.  Past Medical History  Diagnosis Date  . Diabetes mellitus without complication   . Hypertension   . Dementia   . Depression   . Arthritis   . Hyperlipidemia   . Cataract   . Urinary frequency   . Fatty liver   . Schizophrenia   . Peripheral edema   . Internal hemorrhoids   . Adenomatous colon polyp   . Type II or unspecified type diabetes mellitus without mention of complication, not stated as uncontrolled   . Abnormality of gait   . Other malaise and fatigue     Past Surgical History  Procedure Laterality Date  . Tonsillectomy    . Esophagogastroduodenoscopy  02/21/2012    Procedure: ESOPHAGOGASTRODUODENOSCOPY (EGD);  Surgeon: Beryle Beams, MD;  Location: [facility];  Service: Endoscopy;  Laterality: N/A;  . Esophagogastroduodenoscopy N/A 05/22/2012    Procedure: ESOPHAGOGASTRODUODENOSCOPY (EGD);  Surgeon: Beryle Beams, MD;  Location: [facility];  Service: Endoscopy;  Laterality: N/A;  . Balloon dilation N/A 05/22/2012    Procedure: BALLOON DILATION;  Surgeon: Beryle Beams, MD;  Location: [facility];  Service: Endoscopy;  Laterality: N/A;    Family History  Problem Relation Age of Onset  . Brain cancer Father     Social History:  reports that he has never smoked. He has never used smokeless tobacco. He reports that he does not drink alcohol or use illicit drugs.  Allergies:  Allergies  Allergen Reactions  . Asa [Aspirin]     "Dr told me not to take it"  . Codeine Other (See  Comments)    DIZZINESS with Tylenol 3  . Tylenol [Acetaminophen] Other (See Comments)    Dizziness with tylenol 3    Medications:  Scheduled:  Continuous: . sodium chloride      No results found for this or any previous visit (from the past 24 hour(s)).   No results found.  ROS:  As stated above in the HPI otherwise negative.  There were no vitals taken for this visit.    PE: Gen: NAD, Alert and Oriented HEENT:  Love/AT, EOMI Neck: Supple, no LAD Lungs: CTA Bilaterally CV: RRR without M/G/R ABM: Soft, NTND, +BS Ext: No C/C/E  Assessment/Plan: 1) Dysphagia. 2) History of esophageal stricture.  Plan: 1) EGD with dilation.  Sarina Robleto D 06/16/2014, 1:21 PM

## 2014-06-17 ENCOUNTER — Ambulatory Visit (HOSPITAL_COMMUNITY)
Admission: RE | Admit: 2014-06-17 | Discharge: 2014-06-17 | Disposition: A | Discharge status: Skilled Nursing Facility | Payer: Medicare Other | Source: Ambulatory Visit | Attending: Gastroenterology | Admitting: Gastroenterology

## 2014-06-17 ENCOUNTER — Encounter (HOSPITAL_COMMUNITY): Payer: Self-pay

## 2014-06-17 ENCOUNTER — Encounter (HOSPITAL_COMMUNITY)
Admission: RE | Disposition: A | Discharge status: Skilled Nursing Facility | Payer: Self-pay | Source: Ambulatory Visit | Attending: Gastroenterology

## 2014-06-17 DIAGNOSIS — R269 Unspecified abnormalities of gait and mobility: Secondary | ICD-10-CM | POA: Diagnosis not present

## 2014-06-17 DIAGNOSIS — K760 Fatty (change of) liver, not elsewhere classified: Secondary | ICD-10-CM | POA: Insufficient documentation

## 2014-06-17 DIAGNOSIS — Z885 Allergy status to narcotic agent status: Secondary | ICD-10-CM | POA: Insufficient documentation

## 2014-06-17 DIAGNOSIS — K2270 Barrett's esophagus without dysplasia: Secondary | ICD-10-CM | POA: Diagnosis not present

## 2014-06-17 DIAGNOSIS — I10 Essential (primary) hypertension: Secondary | ICD-10-CM | POA: Diagnosis not present

## 2014-06-17 DIAGNOSIS — F0390 Unspecified dementia without behavioral disturbance: Secondary | ICD-10-CM | POA: Insufficient documentation

## 2014-06-17 DIAGNOSIS — M1990 Unspecified osteoarthritis, unspecified site: Secondary | ICD-10-CM | POA: Diagnosis not present

## 2014-06-17 DIAGNOSIS — Z86010 Personal history of colonic polyps: Secondary | ICD-10-CM | POA: Insufficient documentation

## 2014-06-17 DIAGNOSIS — E785 Hyperlipidemia, unspecified: Secondary | ICD-10-CM | POA: Insufficient documentation

## 2014-06-17 DIAGNOSIS — F329 Major depressive disorder, single episode, unspecified: Secondary | ICD-10-CM | POA: Diagnosis not present

## 2014-06-17 DIAGNOSIS — K222 Esophageal obstruction: Secondary | ICD-10-CM | POA: Diagnosis not present

## 2014-06-17 DIAGNOSIS — K449 Diaphragmatic hernia without obstruction or gangrene: Secondary | ICD-10-CM | POA: Diagnosis not present

## 2014-06-17 DIAGNOSIS — E119 Type 2 diabetes mellitus without complications: Secondary | ICD-10-CM | POA: Diagnosis not present

## 2014-06-17 DIAGNOSIS — R350 Frequency of micturition: Secondary | ICD-10-CM | POA: Insufficient documentation

## 2014-06-17 DIAGNOSIS — Z886 Allergy status to analgesic agent status: Secondary | ICD-10-CM | POA: Insufficient documentation

## 2014-06-17 DIAGNOSIS — F209 Schizophrenia, unspecified: Secondary | ICD-10-CM | POA: Diagnosis not present

## 2014-06-17 DIAGNOSIS — R1310 Dysphagia, unspecified: Secondary | ICD-10-CM | POA: Diagnosis present

## 2014-06-17 HISTORY — PX: SAVORY DILATION: SHX5439

## 2014-06-17 HISTORY — PX: ESOPHAGOGASTRODUODENOSCOPY: SHX5428

## 2014-06-17 LAB — GLUCOSE, CAPILLARY: GLUCOSE-CAPILLARY: 131 mg/dL — AB (ref 65–99)

## 2014-06-17 SURGERY — EGD (ESOPHAGOGASTRODUODENOSCOPY)
Anesthesia: Moderate Sedation

## 2014-06-17 MED ORDER — FENTANYL CITRATE (PF) 100 MCG/2ML IJ SOLN
INTRAMUSCULAR | Status: AC
  Filled 2014-06-17: qty 2

## 2014-06-17 MED ORDER — MIDAZOLAM HCL 10 MG/2ML IJ SOLN
INTRAMUSCULAR | Status: DC | PRN
  Administered 2014-06-17: 2 mg via INTRAVENOUS
  Administered 2014-06-17: 1 mg via INTRAVENOUS
  Administered 2014-06-17: 2 mg via INTRAVENOUS

## 2014-06-17 MED ORDER — MIDAZOLAM HCL 10 MG/2ML IJ SOLN
INTRAMUSCULAR | Status: AC
  Filled 2014-06-17: qty 2

## 2014-06-17 MED ORDER — FENTANYL CITRATE (PF) 100 MCG/2ML IJ SOLN
INTRAMUSCULAR | Status: DC | PRN
  Administered 2014-06-17 (×3): 25 ug via INTRAVENOUS

## 2014-06-17 MED ORDER — SODIUM CHLORIDE 0.9 % IV SOLN: INTRAVENOUS | Status: DC

## 2014-06-17 NOTE — Op Note (Signed)
[place], [id number]   ENDOSCOPY PROCEDURE REPORT  PATIENT: Ricky, [name]  MR#: [id number] BIRTHDATE: [birth year] , 42  yrs. old GENDER: male ENDOSCOPIST:Ora Mcnatt Benson [place], MD REFERRED BY: PROCEDURE DATE:  06/28/2014 PROCEDURE:   EGD w/ wire guided (savary) dilation ASA CLASS:    Class III INDICATIONS: dysphagia. MEDICATION: Versed 5 mg IV and Fentanyl 75 mcg IV TOPICAL ANESTHETIC:   none  DESCRIPTION OF PROCEDURE:   After the risks and benefits of the procedure were explained, informed consent was obtained.  The Pentax Gastroscope [id number]  endoscope was introduced through the mouth  and advanced to the second portion of the duodenum .  The instrument was slowly withdrawn as the mucosa was fully examined. Estimated blood loss is zero unless otherwise noted in this procedure report.   FINDINGS: At 22 cm the Z-line was identified.  Two minor focal rings were noted in the upper esophagus.  No evidence of any significant stricture in the rest of the esophagus.  The mucosa was negative for any evidence of nodularity or any other gross abnormal appearance.  The esophagus was dilated with the 17 mm and then the 18 mm Savary dilators.  The most proximal ring exhibited a mild disruption.  Biopsies were obtained in a 2 cm 4 quadrant fashion starting at 35 cm.  A 4-5 cm hiatal hernia was found.  No other abnomalities noted in the gastric lumen or the duodenal lumen. The scope was then withdrawn from the patient and the procedure completed.  COMPLICATIONS: There were no immediate complications.  ENDOSCOPIC IMPRESSION: 1) [id number] Barrett's esophagus. 2) Two minor stenotic mucosal rings in the upper esophagus s/p dilation. 3) 4-5 cm hiatal hernia.  RECOMMENDATIONS: 1) Continue with PPI indefinitely. 2) Follow up in the office in 1 month. 3) Await biopsy results. 4) Repeat the EGD in 3-5 years pending the biopsy  results.   _______________________________ eSignedCarol Ada, MD 06-28-2014 10:30 AM     cc:  CPT CODES: ICD CODES:  The ICD and CPT codes recommended by this software are interpretations from the data that the clinical staff has captured with the software.  The verification of the translation of this report to the ICD and CPT codes and modifiers is the sole responsibility of the health care institution and practicing physician where this report was generated.  [facility] will not be held responsible for the validity of the ICD and CPT codes included on this report.  AMA assumes no liability for data contained or not contained herein. CPT is a Designer, television/film set of the [employer].  PATIENT NAME:  Ricky, [name] MR#: [id number]

## 2014-06-21 ENCOUNTER — Encounter (HOSPITAL_COMMUNITY): Payer: Self-pay | Admitting: Gastroenterology

## 2014-07-13 DIAGNOSIS — R1310 Dysphagia, unspecified: Secondary | ICD-10-CM | POA: Diagnosis not present

## 2014-07-13 DIAGNOSIS — K2270 Barrett's esophagus without dysplasia: Secondary | ICD-10-CM | POA: Diagnosis not present

## 2014-07-22 ENCOUNTER — Ambulatory Visit (HOSPITAL_COMMUNITY)
Admission: RE | Admit: 2014-07-22 | Discharge: 2014-07-22 | Disposition: A | Discharge status: Home / Self Care | Payer: Medicare Other | Attending: Psychiatry | Admitting: Psychiatry

## 2014-07-22 DIAGNOSIS — F209 Schizophrenia, unspecified: Secondary | ICD-10-CM | POA: Insufficient documentation

## 2014-07-22 DIAGNOSIS — K760 Fatty (change of) liver, not elsewhere classified: Secondary | ICD-10-CM | POA: Insufficient documentation

## 2014-07-22 DIAGNOSIS — E119 Type 2 diabetes mellitus without complications: Secondary | ICD-10-CM | POA: Diagnosis not present

## 2014-07-22 DIAGNOSIS — E785 Hyperlipidemia, unspecified: Secondary | ICD-10-CM | POA: Insufficient documentation

## 2014-07-22 DIAGNOSIS — F329 Major depressive disorder, single episode, unspecified: Secondary | ICD-10-CM | POA: Insufficient documentation

## 2014-07-22 DIAGNOSIS — I10 Essential (primary) hypertension: Secondary | ICD-10-CM | POA: Insufficient documentation

## 2014-07-22 DIAGNOSIS — F0390 Unspecified dementia without behavioral disturbance: Secondary | ICD-10-CM | POA: Diagnosis not present

## 2014-07-22 NOTE — BH Assessment (Signed)
Assessment Note  Ricky [name]. is an 74 y.o. male that is self-referred to [facility] accompanied by his caregiver from his nursing home where he resides, Ricky [name] by report.  Pt stated he came to [facility] for "peace of mind" because he felt that "God told me to come."  Pt stated he doesn't feel he needs to come in and Dr. Clovis Pu didn't feel he needed to, but that he just wanted "peace of mind."  Pt's caregiver Ricky [name], stated pt is at his baseline.  Pt also agrees he is at his baseline.  He reports he does hear voices, but "they are better" and do not bother him.  Pt denies visual hallucinations.  No delusions noted.  Pt denies SI or HI.  Pt denies hx of self-harm or harm to others.  Pt denies sx of depression.  Pt stated he has Schizophrenia but takes medications as prescribed.  Pt denies SA.  Pt has no family support other than cousins.  Pt pleasant, euthymic, oriented x 4, had good eye contact, normal speech, logical/coherent thought processes, in a wheelchair in casual clothing.  Consulted with [name], NP at [facility] who also saw the pt and it is recommended pt follow up with his current provider, Dr. Clovis Pu on his next appt.  Pt in agreement with this.  Pt doesn't meet inpatient criteria at this time.  Pt's caregiver present and to take pt back to nursing home.  Updated [name], [facility] at [facility] and TTS staff.  Axis I: 295.70 Schizophrenia Axis II: Deferred Axis III:  Past Medical History  Diagnosis Date  . Diabetes mellitus without complication   . Hypertension   . Dementia   . Depression   . Arthritis   . Hyperlipidemia   . Cataract   . Urinary frequency   . Fatty liver   . Schizophrenia   . Peripheral edema   . Internal hemorrhoids   . Adenomatous colon polyp   . Type II or unspecified type diabetes mellitus without mention of complication, not stated as uncontrolled   . Abnormality of gait   . Other malaise and fatigue    Axis IV: problems with primary support group Axis V:  51-60 moderate symptoms  Past Medical History:  Past Medical History  Diagnosis Date  . Diabetes mellitus without complication   . Hypertension   . Dementia   . Depression   . Arthritis   . Hyperlipidemia   . Cataract   . Urinary frequency   . Fatty liver   . Schizophrenia   . Peripheral edema   . Internal hemorrhoids   . Adenomatous colon polyp   . Type II or unspecified type diabetes mellitus without mention of complication, not stated as uncontrolled   . Abnormality of gait   . Other malaise and fatigue     Past Surgical History  Procedure Laterality Date  . Tonsillectomy    . Esophagogastroduodenoscopy  02/21/2012    Procedure: ESOPHAGOGASTRODUODENOSCOPY (EGD);  Surgeon: Beryle Beams, MD;  Location: [facility];  Service: Endoscopy;  Laterality: N/A;  . Esophagogastroduodenoscopy N/A 05/22/2012    Procedure: ESOPHAGOGASTRODUODENOSCOPY (EGD);  Surgeon: Beryle Beams, MD;  Location: [facility];  Service: Endoscopy;  Laterality: N/A;  . Balloon dilation N/A 05/22/2012    Procedure: BALLOON DILATION;  Surgeon: Beryle Beams, MD;  Location: [facility];  Service: Endoscopy;  Laterality: N/A;  . Esophagogastroduodenoscopy N/A 06/17/2014    Procedure: ESOPHAGOGASTRODUODENOSCOPY (EGD);  Surgeon: Carol Ada, MD;  Location: [facility];  Service: Endoscopy;  Laterality: N/A;  . Savory dilation N/A 06/17/2014    Procedure: SAVORY DILATION;  Surgeon: Carol Ada, MD;  Location: [facility];  Service: Endoscopy;  Laterality: N/A;    Family History:  Family History  Problem Relation Age of Onset  . Brain cancer Father     Social History:  reports that he has never smoked. He has never used smokeless tobacco. He reports that he does not drink alcohol or use illicit drugs.  Additional Social History:  Alcohol / Drug Use Pain Medications: see med list Prescriptions: see med list Over the Counter: see med list History of alcohol / drug use?: No history of alcohol / drug  abuse Longest period of sobriety (when/how long):  (na) Negative Consequences of Use:  (na) Withdrawal Symptoms:  (na)  CIWA:   COWS:    Allergies:  Allergies  Allergen Reactions  . Asa [Aspirin]     "Dr told me not to take it"  . Codeine Other (See Comments)    DIZZINESS with Tylenol 3  . Tylenol [Acetaminophen] Other (See Comments)    Dizziness with tylenol 3    Home Medications:  (Not in a hospital admission)  OB/GYN Status:  No LMP for male patient.  General Assessment Data Location of Assessment: [facility] TTS Assessment: In system Is this a Tele or Face-to-Face Assessment?: Face-to-Face Is this an Initial Assessment or a Re-assessment for this encounter?: Initial Assessment Marital status: Single Maiden name:  (na) Is patient pregnant?:  (na) Pregnancy Status:  (na) Living Arrangements: Other (Comment) (nursing home) Can pt return to current living arrangement?: Yes Admission Status: Voluntary Is patient capable of signing voluntary admission?: Yes Referral Source: Self/Family/Friend Insurance type: Medicare  Medical Screening Exam ([place]) Medical Exam completed: No Reason for MSE not completed: Patient Refused  Crisis Care Plan Living Arrangements: Other (Comment) (nursing home) Name of Psychiatrist: Dr. 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 : No Suicidal Intent: No Has patient had any suicidal intent within the past 6 months prior to admission? : No Is patient at risk for suicide?: No Suicidal Plan?: No Has patient had any suicidal plan within the past 6 months prior to admission? : No Access to Means: No What has been  your use of drugs/alcohol within the last 12 months?: na-pt denies Previous Attempts/Gestures: No How many times?: 0 Other Self Harm Risks: na-pt denies Triggers for Past Attempts: None known Intentional Self Injurious Behavior: None Family Suicide History: No Recent stressful life event(s): Other (Comment) (pt denies) Persecutory voices/beliefs?: No Depression: No Depression Symptoms:  (pt denies) Substance abuse history and/or treatment for substance abuse?: No Suicide prevention information given to non-admitted patients: Not applicable  Risk to Others within the past 6 months Homicidal Ideation: No Does patient have any lifetime risk of violence toward others beyond the six months prior to admission? : No Thoughts of Harm to Others: No Current Homicidal Intent: No Current Homicidal Plan: No Access to Homicidal Means: No Identified Victim: na-pt denies History of harm to others?: No Assessment of Violence: None Noted Violent Behavior Description: na- pt calm, cooperative Does patient have access to weapons?:  No Criminal Charges Pending?: No Does patient have a court date: No Is patient on probation?: No  Psychosis Hallucinations: Auditory (hears voices, baseline for pt by report) Delusions: None noted  Mental Status Report Appearance/Hygiene: Unremarkable, Other (Comment) (In casual clothing) Eye Contact: Good Motor Activity: Freedom of movement, Unremarkable, Other (Comment) (In wheelchair) Speech: Logical/coherent Level of Consciousness: Alert Mood: Euthymic, Pleasant Affect: Appropriate to circumstance Anxiety Level: None Thought Processes: Coherent, Relevant Judgement: Unimpaired Orientation: Person, Place, Time, Situation Obsessive Compulsive Thoughts/Behaviors: None  Cognitive Functioning Concentration: Normal Memory: Recent Impaired, Remote Intact IQ: Average Insight: Good Impulse Control: Good Appetite: Good Weight Loss: 0 Weight Gain: 0 Sleep:  Increased Total Hours of Sleep:  (reports less than 8, but sleeping better by report) Vegetative Symptoms: None  ADLScreening [facility] Assessment Services) Patient's cognitive ability adequate to safely complete daily activities?: Yes Patient able to express need for assistance with ADLs?: Yes Independently performs ADLs?: Yes (appropriate for developmental age)  Prior Inpatient Therapy Prior Inpatient Therapy: Yes Prior Therapy Dates: 40 years ago Prior Therapy Facilty/Provider(s): [facility] Reason for Treatment: Schizophrenia  Prior Outpatient Therapy Prior Outpatient Therapy: Yes Prior Therapy Dates: Current Prior Therapy Facilty/Provider(s): Dr. Clovis Pu Reason for Treatment: med mgnt Does patient have an ACCT team?: No Does patient have Intensive In-House Services?  : No Does patient have [facility] services? : No Does patient have P4CC services?: No  ADL Screening (condition at time of admission) Patient's cognitive ability adequate to safely complete daily activities?: Yes Is the patient deaf or have difficulty hearing?: No Does the patient have difficulty seeing, even when wearing glasses/contacts?: No Does the patient have difficulty concentrating, remembering, or making decisions?: No Patient able to express need for assistance with ADLs?: Yes Does the patient have difficulty dressing or bathing?: Yes Independently performs ADLs?: Yes (appropriate for developmental age) Does the patient have difficulty walking or climbing stairs?: Yes  Home Assistive Devices/Equipment Home Assistive Devices/Equipment: Wheelchair    Abuse/Neglect Assessment (Assessment to be complete while patient is alone) Physical Abuse: Denies Verbal Abuse: Denies Sexual Abuse: Denies Exploitation of patient/patient's resources: Denies Self-Neglect: Denies Values / Beliefs Cultural Requests During Hospitalization: None Spiritual Requests During Hospitalization: None Consults Spiritual Care Consult  Needed: No Social Work Consult Needed: No Regulatory affairs officer (For Healthcare) Does patient have an advance directive?: No Would patient like information on creating an advanced directive?: No - patient declined information    Additional Information 1:1 In Past 12 Months?: No CIRT Risk: No Elopement Risk: No Does patient have medical clearance?: No     Disposition:  Disposition Initial Assessment Completed for this Encounter: Yes Disposition of Patient: Referred to, Outpatient treatment Type of outpatient treatment: Adult (Referred back to current provider)  On Site Evaluation by:   Reviewed with Physician:    Shaune Pascal, MS, [facility] Therapeutic Triage Specialist [facility]   07/22/2014 11:39 AM

## 2014-07-26 DIAGNOSIS — E118 Type 2 diabetes mellitus with unspecified complications: Secondary | ICD-10-CM | POA: Diagnosis not present

## 2014-08-02 DIAGNOSIS — E118 Type 2 diabetes mellitus with unspecified complications: Secondary | ICD-10-CM | POA: Diagnosis not present

## 2014-08-02 DIAGNOSIS — N400 Enlarged prostate without lower urinary tract symptoms: Secondary | ICD-10-CM | POA: Diagnosis not present

## 2014-08-10 ENCOUNTER — Encounter (HOSPITAL_COMMUNITY)
Admission: EM | Disposition: A | Discharge status: Home / Self Care | Payer: Self-pay | Source: Home / Self Care | Attending: Emergency Medicine

## 2014-08-10 ENCOUNTER — Encounter (HOSPITAL_COMMUNITY): Payer: Self-pay | Admitting: Emergency Medicine

## 2014-08-10 ENCOUNTER — Emergency Department (HOSPITAL_COMMUNITY)
Admission: EM | Admit: 2014-08-10 | Discharge: 2014-08-11 | Disposition: A | Discharge status: Home / Self Care | Payer: Medicare Other | Attending: Emergency Medicine | Admitting: Emergency Medicine

## 2014-08-10 ENCOUNTER — Emergency Department (HOSPITAL_COMMUNITY): Payer: Medicare Other

## 2014-08-10 DIAGNOSIS — I10 Essential (primary) hypertension: Secondary | ICD-10-CM | POA: Insufficient documentation

## 2014-08-10 DIAGNOSIS — K449 Diaphragmatic hernia without obstruction or gangrene: Secondary | ICD-10-CM | POA: Insufficient documentation

## 2014-08-10 DIAGNOSIS — X58XXXA Exposure to other specified factors, initial encounter: Secondary | ICD-10-CM | POA: Diagnosis not present

## 2014-08-10 DIAGNOSIS — Z794 Long term (current) use of insulin: Secondary | ICD-10-CM | POA: Diagnosis not present

## 2014-08-10 DIAGNOSIS — E785 Hyperlipidemia, unspecified: Secondary | ICD-10-CM | POA: Diagnosis not present

## 2014-08-10 DIAGNOSIS — K222 Esophageal obstruction: Secondary | ICD-10-CM | POA: Diagnosis not present

## 2014-08-10 DIAGNOSIS — T18128A Food in esophagus causing other injury, initial encounter: Secondary | ICD-10-CM | POA: Diagnosis not present

## 2014-08-10 DIAGNOSIS — T17308A Unspecified foreign body in larynx causing other injury, initial encounter: Secondary | ICD-10-CM | POA: Diagnosis not present

## 2014-08-10 DIAGNOSIS — M1990 Unspecified osteoarthritis, unspecified site: Secondary | ICD-10-CM | POA: Diagnosis not present

## 2014-08-10 DIAGNOSIS — R1310 Dysphagia, unspecified: Secondary | ICD-10-CM | POA: Diagnosis not present

## 2014-08-10 DIAGNOSIS — Z791 Long term (current) use of non-steroidal anti-inflammatories (NSAID): Secondary | ICD-10-CM | POA: Insufficient documentation

## 2014-08-10 DIAGNOSIS — Y9289 Other specified places as the place of occurrence of the external cause: Secondary | ICD-10-CM | POA: Insufficient documentation

## 2014-08-10 DIAGNOSIS — Y9389 Activity, other specified: Secondary | ICD-10-CM | POA: Insufficient documentation

## 2014-08-10 DIAGNOSIS — Z0389 Encounter for observation for other suspected diseases and conditions ruled out: Secondary | ICD-10-CM | POA: Diagnosis not present

## 2014-08-10 DIAGNOSIS — E119 Type 2 diabetes mellitus without complications: Secondary | ICD-10-CM | POA: Insufficient documentation

## 2014-08-10 DIAGNOSIS — F0390 Unspecified dementia without behavioral disturbance: Secondary | ICD-10-CM | POA: Diagnosis not present

## 2014-08-10 DIAGNOSIS — Z79899 Other long term (current) drug therapy: Secondary | ICD-10-CM | POA: Diagnosis not present

## 2014-08-10 DIAGNOSIS — Y998 Other external cause status: Secondary | ICD-10-CM | POA: Diagnosis not present

## 2014-08-10 DIAGNOSIS — F209 Schizophrenia, unspecified: Secondary | ICD-10-CM | POA: Insufficient documentation

## 2014-08-10 DIAGNOSIS — F329 Major depressive disorder, single episode, unspecified: Secondary | ICD-10-CM | POA: Insufficient documentation

## 2014-08-10 DIAGNOSIS — Z86010 Personal history of colonic polyps: Secondary | ICD-10-CM | POA: Diagnosis not present

## 2014-08-10 HISTORY — PX: ESOPHAGOGASTRODUODENOSCOPY: SHX5428

## 2014-08-10 LAB — CBC WITH DIFFERENTIAL/PLATELET
BASOS PCT: 0 % (ref 0–1)
Basophils Absolute: 0.1 10*3/uL (ref 0.0–0.1)
Eosinophils Absolute: 0.5 10*3/uL (ref 0.0–0.7)
Eosinophils Relative: 4 % (ref 0–5)
HEMATOCRIT: 38.7 % — AB (ref 39.0–52.0)
HEMOGLOBIN: 12.7 g/dL — AB (ref 13.0–17.0)
Lymphocytes Relative: 19 % (ref 12–46)
Lymphs Abs: 2.4 10*3/uL (ref 0.7–4.0)
MCH: 29.6 pg (ref 26.0–34.0)
MCHC: 32.8 g/dL (ref 30.0–36.0)
MCV: 90.2 fL (ref 78.0–100.0)
Monocytes Absolute: 0.8 10*3/uL (ref 0.1–1.0)
Monocytes Relative: 7 % (ref 3–12)
Neutro Abs: 8.7 10*3/uL — ABNORMAL HIGH (ref 1.7–7.7)
Neutrophils Relative %: 70 % (ref 43–77)
Platelets: 217 10*3/uL (ref 150–400)
RBC: 4.29 MIL/uL (ref 4.22–5.81)
RDW: 13.7 % (ref 11.5–15.5)
WBC: 12.4 10*3/uL — AB (ref 4.0–10.5)

## 2014-08-10 LAB — BASIC METABOLIC PANEL
ANION GAP: 11 (ref 5–15)
BUN: 13 mg/dL (ref 6–20)
CALCIUM: 9.7 mg/dL (ref 8.9–10.3)
CO2: 29 mmol/L (ref 22–32)
Chloride: 99 mmol/L — ABNORMAL LOW (ref 101–111)
Creatinine, Ser: 1.27 mg/dL — ABNORMAL HIGH (ref 0.61–1.24)
GFR calc Af Amer: >60 mL/min (ref >60)
GFR calc non Af Amer: 54 mL/min — ABNORMAL LOW (ref >60)
Glucose, Bld: 184 mg/dL — ABNORMAL HIGH (ref 65–99)
POTASSIUM: 3.7 mmol/L (ref 3.5–5.1)
Sodium: 139 mmol/L (ref 135–145)

## 2014-08-10 SURGERY — EGD (ESOPHAGOGASTRODUODENOSCOPY)
Anesthesia: Moderate Sedation

## 2014-08-10 MED ORDER — MIDAZOLAM HCL 10 MG/2ML IJ SOLN
INTRAMUSCULAR | Status: DC | PRN
  Administered 2014-08-10 (×4): 2 mg via INTRAVENOUS

## 2014-08-10 MED ORDER — GLUCAGON HCL RDNA (DIAGNOSTIC) 1 MG IJ SOLR
0.3000 mg | Freq: Once | INTRAMUSCULAR | Status: AC
  Administered 2014-08-10: 0.3 mg via INTRAVENOUS

## 2014-08-10 MED ORDER — MIDAZOLAM HCL 5 MG/ML IJ SOLN
INTRAMUSCULAR | Status: AC
  Filled 2014-08-10: qty 2

## 2014-08-10 MED ORDER — GLUCAGON HCL RDNA (DIAGNOSTIC) 1 MG IJ SOLR
INTRAMUSCULAR | Status: AC
  Administered 2014-08-10: 0.3 mg via INTRAVENOUS
  Filled 2014-08-10: qty 1

## 2014-08-10 MED ORDER — FENTANYL CITRATE (PF) 100 MCG/2ML IJ SOLN
INTRAMUSCULAR | Status: AC
  Filled 2014-08-10: qty 2

## 2014-08-10 MED ORDER — DIPHENHYDRAMINE HCL 50 MG/ML IJ SOLN
INTRAMUSCULAR | Status: AC
  Filled 2014-08-10: qty 1

## 2014-08-10 MED ORDER — METOCLOPRAMIDE HCL 5 MG/ML IJ SOLN
10.0000 mg | Freq: Once | INTRAMUSCULAR | Status: AC
  Administered 2014-08-10: 10 mg via INTRAVENOUS
  Filled 2014-08-10: qty 2

## 2014-08-10 MED ORDER — FENTANYL CITRATE (PF) 100 MCG/2ML IJ SOLN
INTRAMUSCULAR | Status: DC | PRN
  Administered 2014-08-10 (×3): 25 ug via INTRAVENOUS

## 2014-08-10 MED ORDER — DIPHENHYDRAMINE HCL 50 MG/ML IJ SOLN
12.5000 mg | Freq: Once | INTRAMUSCULAR | Status: AC
  Administered 2014-08-10: 12.5 mg via INTRAVENOUS
  Filled 2014-08-10: qty 1

## 2014-08-10 NOTE — ED Notes (Signed)
Bed: IT25 Expected date:  Expected time:  Means of arrival:  Comments: Pt in room with endoscopy

## 2014-08-10 NOTE — Op Note (Signed)
[place], [address]   ENDOSCOPY PROCEDURE REPORT  PATIENT: Ricky, [name]  MR#: [id number] BIRTHDATE: Aug 15, 1940 , 1  yrs. old GENDER: male ENDOSCOPIST:Yakima Kreitzer Benson Norway, MD REFERRED BY: ER PROCEDURE DATE:  August 23, 2014 PROCEDURE:   EGD with disimpaction ASA CLASS:    Class III INDICATIONS: Food Impaction MEDICATION: Versed 6 mg IV and Fentanyl 75 mcg IV TOPICAL ANESTHETIC:   none  DESCRIPTION OF PROCEDURE:   After the risks and benefits of the procedure were explained, informed consent was obtained.  The endoscope was introduced through the mouth  and advanced to the second portion of the duodenum .  The instrument was slowly withdrawn as the mucosa was fully examined. Estimated blood loss is zero unless otherwise noted in this procedure report.   FINDINGS: At 22 cm a large meat impaction was identified.  There was a significant amount of retained fluid in the esophagus, which was quickly suctioned to prevent aspiration.  Attempts were made to push the bolus through the stenotic region, but it was apparent that the bolus was too large.  The talon grasper was employed and a small portion was initially extracted.  I attempted to break down the meat bolus with the talon grasper, but this was not successful. The decision was then made to extract the large food bolus.  With a firm grasp of the meat bolus the entire mass was able to be removed.  It was measured to be 2.5 cm and it was clearly not masticated.  Evaluation of the site revealed a mildly stenotic ring at 22 cm and a significant amount of mucosal irritation.  No dilation was attempted.          The scope was then withdrawn from the patient and the procedure completed.  COMPLICATIONS: There were no immediate complications.  ENDOSCOPIC IMPRESSION: 1) Large meat impaction as a result of poor chewing. 2) 3-4 cm hiatal hernia.  RECOMMENDATIONS: 1) Must eat very small portions and  to chew those small portions. 2) Continue with PPI.   _______________________________ eSignedCarol Ada, MD August 23, 2014 11:11 PM     cc:  CPT CODES: ICD CODES:  The ICD and CPT codes recommended by this software are interpretations from the data that the clinical staff has captured with the software.  The verification of the translation of this report to the ICD and CPT codes and modifiers is the sole responsibility of the health care institution and practicing physician where this report was generated.  [employer]. will not be held responsible for the validity of the ICD and CPT codes included on this report.  AMA assumes no liability for data contained or not contained herein. CPT is a Designer, television/film set of the [employer].  PATIENT NAME:  Ricky, [name] MR#: [id number]

## 2014-08-10 NOTE — ED Notes (Signed)
Endoscopy nurses at bedside.

## 2014-08-10 NOTE — ED Notes (Signed)
Pt states he has been unable to swallow since eating a piece of ham tonight.  Pt has hx of same.

## 2014-08-10 NOTE — ED Notes (Addendum)
Pt states that couple months ago he had to get his esophagus stretched and today while eating he had trouble swallowing. States felt like it got stuck.  Pt from [place].

## 2014-08-10 NOTE — Consult Note (Signed)
Reason for Consult: Food Impaction Referring Physician: ER  [name]. HPI: This is a 74 year old male who is well-known to me for complaints of dysphagia.  He had a prior stricture that was successfully dilated 02/21/2012 and 05/22/2012, but the most recent dilation on 06/17/2014 did not produced the same result.  Non-stenotic peptic rings in the upper esophagus were encountered.  Savary dilation up to 18 mm was performed, but he continued to have issues with dysphagia post procedure.  I recommended that he chew well and continue his PPI.  Additionally the Barrett's biopsies were negative for any dysplasia.  Today at lunch time he ate a piece of ham.  Since that ingestion he has not been able to manage his oral secretions.  He feels as if it is stuck in his throat.  Past Medical History  Diagnosis Date  . Diabetes mellitus without complication   . Hypertension   . Dementia   . Depression   . Arthritis   . Hyperlipidemia   . Cataract   . Urinary frequency   . Fatty liver   . Schizophrenia   . Peripheral edema   . Internal hemorrhoids   . Adenomatous colon polyp   . Type II or unspecified type diabetes mellitus without mention of complication, not stated as uncontrolled   . Abnormality of gait   . Other malaise and fatigue     Past Surgical History  Procedure Laterality Date  . Tonsillectomy    . Esophagogastroduodenoscopy  02/21/2012    Procedure: ESOPHAGOGASTRODUODENOSCOPY (EGD);  Surgeon: Beryle Beams, MD;  Location: [facility];  Service: Endoscopy;  Laterality: N/A;  . Esophagogastroduodenoscopy N/A 05/22/2012    Procedure: ESOPHAGOGASTRODUODENOSCOPY (EGD);  Surgeon: Beryle Beams, MD;  Location: [facility];  Service: Endoscopy;  Laterality: N/A;  . Balloon dilation N/A 05/22/2012    Procedure: BALLOON DILATION;  Surgeon: Beryle Beams, MD;  Location: [facility];  Service: Endoscopy;  Laterality: N/A;  . Esophagogastroduodenoscopy N/A 06/17/2014    Procedure:  ESOPHAGOGASTRODUODENOSCOPY (EGD);  Surgeon: Carol Ada, MD;  Location: [facility];  Service: Endoscopy;  Laterality: N/A;  . Savory dilation N/A 06/17/2014    Procedure: SAVORY DILATION;  Surgeon: Carol Ada, MD;  Location: [facility];  Service: Endoscopy;  Laterality: N/A;    Family History  Problem Relation Age of Onset  . Brain cancer Father     Social History:  reports that he has never smoked. He has never used smokeless tobacco. He reports that he does not drink alcohol or use illicit drugs.  Allergies:  Allergies  Allergen Reactions  . Asa [Aspirin]     "Dr told me not to take it"  . Codeine Other (See Comments)    DIZZINESS with Tylenol 3  . Tylenol [Acetaminophen] Other (See Comments)    Dizziness with tylenol 3    Medications: Scheduled: Continuous:  Results for orders placed or performed during the hospital encounter of 08/10/14 (from the past 24 hour(s))  CBC with Differential     Status: Abnormal   Collection Time: 08/10/14  8:29 PM  Result Value Ref Range   WBC 12.4 (H) 4.0 - 10.5 K/uL   RBC 4.29 4.22 - 5.81 MIL/uL   Hemoglobin 12.7 (L) 13.0 - 17.0 g/dL   HCT 38.7 (L) 39.0 - 52.0 %   MCV 90.2 78.0 - 100.0 fL   MCH 29.6 26.0 - 34.0 pg   MCHC 32.8 30.0 - 36.0 g/dL   RDW 13.7 11.5 -  15.5 %   Platelets 217 150 - 400 K/uL   Neutrophils Relative % 70 43 - 77 %   Neutro Abs 8.7 (H) 1.7 - 7.7 K/uL   Lymphocytes Relative 19 12 - 46 %   Lymphs Abs 2.4 0.7 - 4.0 K/uL   Monocytes Relative 7 3 - 12 %   Monocytes Absolute 0.8 0.1 - 1.0 K/uL   Eosinophils Relative 4 0 - 5 %   Eosinophils Absolute 0.5 0.0 - 0.7 K/uL   Basophils Relative 0 0 - 1 %   Basophils Absolute 0.1 0.0 - 0.1 K/uL  Basic metabolic panel     Status: Abnormal   Collection Time: 08/10/14  8:29 PM  Result Value Ref Range   Sodium 139 135 - 145 mmol/L   Potassium 3.7 3.5 - 5.1 mmol/L   Chloride 99 (L) 101 - 111 mmol/L   CO2 29 22 - 32 mmol/L   Glucose, Bld 184 (H) 65 - 99 mg/dL   BUN 13 6  - 20 mg/dL   Creatinine, Ser 1.27 (H) 0.61 - 1.24 mg/dL   Calcium 9.7 8.9 - 10.3 mg/dL   GFR calc non Af Amer 54 (L) >60 mL/min   GFR calc Af Amer >60 >60 mL/min   Anion gap 11 5 - 15     Dg Neck Soft Tissue  08/10/2014   CLINICAL DATA:  Possible retained food bolus  EXAM: NECK SOFT TISSUES - 1+ VIEW  COMPARISON:  None.  FINDINGS: The epiglottis and aryepiglottic folds are within normal limits. No radiopaque density to correspond with a retained food bolus is identified. Heavy carotid calcifications are noted. The upper lung apices are within normal limits.  IMPRESSION: No acute abnormality seen.   Electronically Signed   By: Inez Catalina M.D.   On: 08/10/2014 20:28   Dg Chest 2 View  08/10/2014   CLINICAL DATA:  74 year old male with sensation of food impaction.  EXAM: CHEST  2 VIEW  COMPARISON:  Radiograph dated 03/29/2014  FINDINGS: The heart size and mediastinal contours are within normal limits. Both lungs are clear. Degenerative changes of spine.  IMPRESSION: No active cardiopulmonary disease.   Electronically Signed   By: Anner Crete M.D.   On: 08/10/2014 20:29    ROS:  As stated above in the HPI otherwise negative.  Blood pressure 138/73, pulse 96, temperature 98.7 F (37.1 C), temperature source Oral, resp. rate 18, SpO2 98 %.    PE: Gen: NAD, Alert and Oriented HEENT:  [place]/AT, EOMI Neck: Supple, no LAD Lungs: CTA Bilaterally CV: RRR without M/G/R ABM: Soft, NTND, +BS Ext: No C/C/E  Assessment/Plan: 1) Food impaction - Emergent EGD with possible dilation.  Sirena Riddle D 08/10/2014, 10:31 PM

## 2014-08-10 NOTE — ED Notes (Signed)
Endoscopy complete.  Patient received Fentanyl 20mcg & Versed 7mg .  Patient now awake, alert, oriented to baseline.

## 2014-08-10 NOTE — Discharge Instructions (Signed)
Soft diet for several days.   Follow up with your GI doctor.   Return to ER if you are choking on your food, trouble breathing.

## 2014-08-10 NOTE — ED Provider Notes (Addendum)
CSN: [id number]     Arrival date & time 08/10/14  1705 History   First MD Initiated Contact with Patient 08/10/14 1946     Chief Complaint  Patient presents with  . trouble swallowing      (Consider location/radiation/quality/duration/timing/severity/associated sxs/prior Treatment) The history is provided by the patient.  Ricky [name]. is a 74 y.o. male hx of DM, HTN, dementia here with trouble swallowing.  States that he ate some ham around noontime.  He then felt that something is stuck in his throat or chest area.  He states that he was unable to keep anything down including saliva afterwards.  Has history of barrett's esophagus with dilation several months ago by Dr. Benson Norway.     Level V caveat- dementia    Past Medical History  Diagnosis Date  . Diabetes mellitus without complication   . Hypertension   . Dementia   . Depression   . Arthritis   . Hyperlipidemia   . Cataract   . Urinary frequency   . Fatty liver   . Schizophrenia   . Peripheral edema   . Internal hemorrhoids   . Adenomatous colon polyp   . Type II or unspecified type diabetes mellitus without mention of complication, not stated as uncontrolled   . Abnormality of gait   . Other malaise and fatigue    Past Surgical History  Procedure Laterality Date  . Tonsillectomy    . Esophagogastroduodenoscopy  02/21/2012    Procedure: ESOPHAGOGASTRODUODENOSCOPY (EGD);  Surgeon: Beryle Beams, MD;  Location: [facility];  Service: Endoscopy;  Laterality: N/A;  . Esophagogastroduodenoscopy N/A 05/22/2012    Procedure: ESOPHAGOGASTRODUODENOSCOPY (EGD);  Surgeon: Beryle Beams, MD;  Location: [facility];  Service: Endoscopy;  Laterality: N/A;  . Balloon dilation N/A 05/22/2012    Procedure: BALLOON DILATION;  Surgeon: Beryle Beams, MD;  Location: [facility];  Service: Endoscopy;  Laterality: N/A;  . Esophagogastroduodenoscopy N/A 06/17/2014    Procedure: ESOPHAGOGASTRODUODENOSCOPY (EGD);  Surgeon: Carol Ada,  MD;  Location: [facility];  Service: Endoscopy;  Laterality: N/A;  . Savory dilation N/A 06/17/2014    Procedure: SAVORY DILATION;  Surgeon: Carol Ada, MD;  Location: [facility];  Service: Endoscopy;  Laterality: N/A;   Family History  Problem Relation Age of Onset  . Brain cancer Father    History  Substance Use Topics  . Smoking status: Never Smoker   . Smokeless tobacco: Never Used  . Alcohol Use: No    Review of Systems  HENT: Positive for trouble swallowing.   All other systems reviewed and are negative.     Allergies  Asa; Codeine; and Tylenol  Home Medications   Prior to Admission medications   Medication Sig Start Date End Date Taking? Authorizing Provider  ALPRAZolam Duanne Moron) 0.5 MG tablet Take 0.5 mg by mouth at bedtime.   Yes Historical Provider, MD  amLODipine (NORVASC) 5 MG tablet Take 5 mg by mouth daily. 09/04/12  Yes Historical Provider, MD  Calcium Citrate-Vitamin D (CITRACAL + D PO) Take 1 tablet by mouth daily.   Yes Historical Provider, MD  chlorhexidine (PERIDEX) 0.12 % solution Use as directed 15 mLs in the mouth or throat every evening.   Yes Historical Provider, MD  ergocalciferol (VITAMIN D2) 50000 UNITS capsule Take 50,000 Units by mouth once a week.   Yes Historical Provider, MD  fluvoxaMINE (LUVOX) 100 MG tablet Take 100 mg by mouth at bedtime.   Yes Historical Provider, MD  furosemide (LASIX) 20  MG tablet Take 20 mg by mouth daily before breakfast.  12/07/11  Yes Historical Provider, MD  insulin aspart (NOVOLOG) 100 UNIT/ML injection Inject 2-10 Units into the skin 3 (three) times daily with meals. 150-200 3 units  201-250 4 units 251-300 6 units 301-350 8 units  351-400 10 units 400> call MD   Yes Historical Provider, MD  insulin detemir (LEVEMIR) 100 UNIT/ML injection Inject 25 Units into the skin at bedtime.   Yes Historical Provider, MD  meloxicam (MOBIC) 15 MG tablet Take 15 mg by mouth daily.   Yes Historical Provider, MD  metFORMIN  (GLUCOPHAGE) 500 MG tablet Take 500 mg by mouth 2 (two) times daily with a meal.   Yes Historical Provider, MD  Multiple Vitamins-Minerals (DECUBI-VITE PO) Take 1 tablet by mouth daily.   Yes Historical Provider, MD  OLANZapine (ZYPREXA) 20 MG tablet Take 20 mg by mouth at bedtime.   Yes Historical Provider, MD  omega-3 acid ethyl esters (LOVAZA) 1 G capsule Take 1 g by mouth daily.   Yes Historical Provider, MD  omeprazole (PRILOSEC) 40 MG capsule Take 40 mg by mouth 2 (two) times daily.  09/28/12  Yes Historical Provider, MD  ONGLYZA 5 MG TABS tablet Take 5 mg by mouth daily before breakfast.  12/06/11  Yes Historical Provider, MD  pravastatin (PRAVACHOL) 40 MG tablet Take 40 mg by mouth daily.   Yes Historical Provider, MD  traMADol (ULTRAM) 50 MG tablet Take 50 mg by mouth every 6 (six) hours as needed for moderate pain.   Yes Historical Provider, MD  insulin glargine (LANTUS) 100 UNIT/ML injection Inject 15 Units into the skin 2 (two) times daily.    Historical Provider, MD   BP 123/80 mmHg  Pulse 99  Temp(Src) 98.6 F (37 C) (Oral)  Resp 19  SpO2 97% Physical Exam  Constitutional: He appears well-developed.  Chronically ill, demented   HENT:  Head: Normocephalic.  Mouth/Throat: Oropharynx is clear and moist.  No obvious foreign body in OP   Eyes: Conjunctivae are normal. Pupils are equal, round, and reactive to light.  Neck: Normal range of motion. Neck supple.  Cardiovascular: Normal rate, regular rhythm and normal heart sounds.   Pulmonary/Chest: Effort normal and breath sounds normal. No respiratory distress. He has no wheezes. He has no rales.  Abdominal: Soft. Bowel sounds are normal. He exhibits no distension. There is no tenderness. There is no rebound.  Musculoskeletal: Normal range of motion. He exhibits no edema or tenderness.  Neurological: He is alert.  Demented, moving all extremities   Skin: Skin is warm and dry.  Psychiatric: He has a normal mood and affect. His  behavior is normal. Judgment and thought content normal.  Nursing note and vitals reviewed.   ED Course  Procedures (including critical care time) Labs Review Labs Reviewed  CBC WITH DIFFERENTIAL/PLATELET - Abnormal; Notable for the following:    WBC 12.4 (*)    Hemoglobin 12.7 (*)    HCT 38.7 (*)    Neutro Abs 8.7 (*)    All other components within normal limits  BASIC METABOLIC PANEL - Abnormal; Notable for the following:    Chloride 99 (*)    Glucose, Bld 184 (*)    Creatinine, Ser 1.27 (*)    GFR calc non Af Amer 54 (*)    All other components within normal limits    Imaging Review Dg Neck Soft Tissue  08/10/2014   CLINICAL DATA:  Possible retained food bolus  EXAM:  NECK SOFT TISSUES - 1+ VIEW  COMPARISON:  None.  FINDINGS: The epiglottis and aryepiglottic folds are within normal limits. No radiopaque density to correspond with a retained food bolus is identified. Heavy carotid calcifications are noted. The upper lung apices are within normal limits.  IMPRESSION: No acute abnormality seen.   Electronically Signed   By: Inez Catalina M.D.   On: 08/10/2014 20:28   Dg Chest 2 View  08/10/2014   CLINICAL DATA:  74 year old male with sensation of food impaction.  EXAM: CHEST  2 VIEW  COMPARISON:  Radiograph dated 03/29/2014  FINDINGS: The heart size and mediastinal contours are within normal limits. Both lungs are clear. Degenerative changes of spine.  IMPRESSION: No active cardiopulmonary disease.   Electronically Signed   By: Anner Crete M.D.   On: 08/10/2014 20:29     EKG Interpretation None      MDM   Final diagnoses:  None   Ricky [name]. is a 74 y.o. male here with trouble swallowing. Concerned for food impaction from barrett's esophagus. Will get xrays, try reglan, glucagon. If he still can't swallow anything, will need GI consult.   9:17 PM  xrays showed no obvious foreign body. However, unable to tolerate secretions despite meds. Consulted Dr. Benson Norway, who  plans on bring him to endoscopy unit and likely dilate esophagus and remove foreign body.   11:35 PM He had foreign body removed. Felt better, tolerated fluids. Will dc home.     Wandra Arthurs, MD 08/10/14 2118  Wandra Arthurs, MD 08/10/14 [phone]

## 2014-08-10 NOTE — ED Notes (Signed)
Per EMS, pt was eating boneless ham earlier today, feels that ham got "caught in throat," pt not in any respiratory distress, pt in control of secretions, voice quality normal. Pt has history of esophageal stretching.

## 2014-08-10 NOTE — ED Notes (Signed)
No change after glucagon administration.  Pt unable to swallow fluids.  Dr. Darl Householder notified.

## 2014-08-11 DIAGNOSIS — R0989 Other specified symptoms and signs involving the circulatory and respiratory systems: Secondary | ICD-10-CM | POA: Diagnosis not present

## 2014-08-11 NOTE — ED Notes (Signed)
Report called to NH staff.  [facility] called for patient transport.

## 2014-08-17 ENCOUNTER — Encounter (HOSPITAL_COMMUNITY): Payer: Self-pay | Admitting: Gastroenterology

## 2014-08-18 DIAGNOSIS — M6281 Muscle weakness (generalized): Secondary | ICD-10-CM | POA: Diagnosis not present

## 2014-08-18 DIAGNOSIS — E1140 Type 2 diabetes mellitus with diabetic neuropathy, unspecified: Secondary | ICD-10-CM | POA: Diagnosis not present

## 2014-08-25 DIAGNOSIS — N390 Urinary tract infection, site not specified: Secondary | ICD-10-CM | POA: Diagnosis not present

## 2014-08-25 DIAGNOSIS — R319 Hematuria, unspecified: Secondary | ICD-10-CM | POA: Diagnosis not present

## 2014-08-29 DIAGNOSIS — H25013 Cortical age-related cataract, bilateral: Secondary | ICD-10-CM | POA: Diagnosis not present

## 2014-08-29 DIAGNOSIS — H40013 Open angle with borderline findings, low risk, bilateral: Secondary | ICD-10-CM | POA: Diagnosis not present

## 2014-08-29 DIAGNOSIS — H2513 Age-related nuclear cataract, bilateral: Secondary | ICD-10-CM | POA: Diagnosis not present

## 2014-08-29 DIAGNOSIS — H35033 Hypertensive retinopathy, bilateral: Secondary | ICD-10-CM | POA: Diagnosis not present

## 2014-08-31 ENCOUNTER — Encounter: Payer: Self-pay | Admitting: Podiatry

## 2014-08-31 ENCOUNTER — Ambulatory Visit (INDEPENDENT_AMBULATORY_CARE_PROVIDER_SITE_OTHER): Payer: Medicare Other | Admitting: Podiatry

## 2014-08-31 VITALS — BP 115/74 | HR 89 | Resp 18

## 2014-08-31 DIAGNOSIS — B351 Tinea unguium: Secondary | ICD-10-CM | POA: Diagnosis not present

## 2014-08-31 DIAGNOSIS — M79609 Pain in unspecified limb: Secondary | ICD-10-CM

## 2014-08-31 DIAGNOSIS — E119 Type 2 diabetes mellitus without complications: Secondary | ICD-10-CM

## 2014-08-31 DIAGNOSIS — M79673 Pain in unspecified foot: Secondary | ICD-10-CM | POA: Diagnosis not present

## 2014-08-31 NOTE — Progress Notes (Signed)
Subjective:     Patient ID: Ricky [name]., male   DOB: 03-27-1940, 74 y.o.   MRN: [id number]  HPIThis patient presents to the office for preventive foot care services.  His nails have grown long thick and ingrown on second and third toes both feet.  He is diabetic with no complications.   Review of Systems     Objective:   Physical Exam GENERAL APPEARANCE: Alert, conversant. Appropriately groomed. No acute distress.  VASCULAR: Pedal pulses palpable at  [facility] and PT bilateral.  Capillary refill time is immediate to all digits,  Normal temperature gradient.  Digital hair growth is present bilateral  NEUROLOGIC: sensation is normal to 5.07 monofilament at 5/5 sites bilateral.  Light touch is intact bilateral, Muscle strength normal.  MUSCULOSKELETAL: acceptable muscle strength, tone and stability bilateral.  Intrinsic muscluature intact bilateral.  Rectus appearance of foot and digits noted bilateral.   DERMATOLOGIC: skin color, texture, and turgor are within normal limits.  No preulcerative lesions or ulcers  are seen, no interdigital maceration noted.  No open lesions present.  . No drainage noted. NAILS  Thick disfigured discolored nails both feet with no drainage or infection.      Assessment:     Onychomycosis     Plan:     Debridement of Nails both feet.  RTC 10 weeks.

## 2014-11-02 ENCOUNTER — Encounter: Payer: Self-pay | Admitting: Podiatry

## 2014-11-02 ENCOUNTER — Ambulatory Visit (INDEPENDENT_AMBULATORY_CARE_PROVIDER_SITE_OTHER): Payer: Medicare Other | Admitting: Podiatry

## 2014-11-02 DIAGNOSIS — M722 Plantar fascial fibromatosis: Secondary | ICD-10-CM

## 2014-11-02 DIAGNOSIS — M79609 Pain in unspecified limb: Secondary | ICD-10-CM

## 2014-11-02 DIAGNOSIS — B351 Tinea unguium: Secondary | ICD-10-CM | POA: Diagnosis not present

## 2014-11-02 NOTE — Progress Notes (Signed)
Subjective:     Patient ID: Ricky [name]., male   DOB: Mar 21, 1940, 74 y.o.   MRN: [id number]  HPIThis patient presents to the office for preventive foot care services.  His nails have grown long thick and ingrown on second and third toes both feet.  He is diabetic with no complications.   Review of Systems     Objective:   Physical Exam GENERAL APPEARANCE: Alert, conversant. Appropriately groomed. No acute distress.  VASCULAR: Pedal pulses palpable at  [facility] and PT bilateral.  Capillary refill time is immediate to all digits,  Normal temperature gradient.  Digital hair growth is present bilateral  NEUROLOGIC: sensation is normal to 5.07 monofilament at 5/5 sites bilateral.  Light touch is intact bilateral, Muscle strength normal.  MUSCULOSKELETAL: acceptable muscle strength, tone and stability bilateral.  Intrinsic muscluature intact bilateral.  Rectus appearance of foot and digits noted bilateral.   DERMATOLOGIC: skin color, texture, and turgor are within normal limits.  No preulcerative lesions or ulcers  are seen, no interdigital maceration noted.  No open lesions present.  . No drainage noted. NAILS  Thick disfigured discolored nails both feet with no drainage or infection.      Assessment:     Onychomycosis     Plan:     Debridement of Nails both feet.  RTC 10 weeks.

## 2014-11-21 IMAGING — CR DG CHEST 2V
2 series · 2 of 2 positions shown · non-contrast
Comparison: 12/18/2011.

CLINICAL DATA: Chest pain.

CHEST - 2 VIEW

[w chest lat]
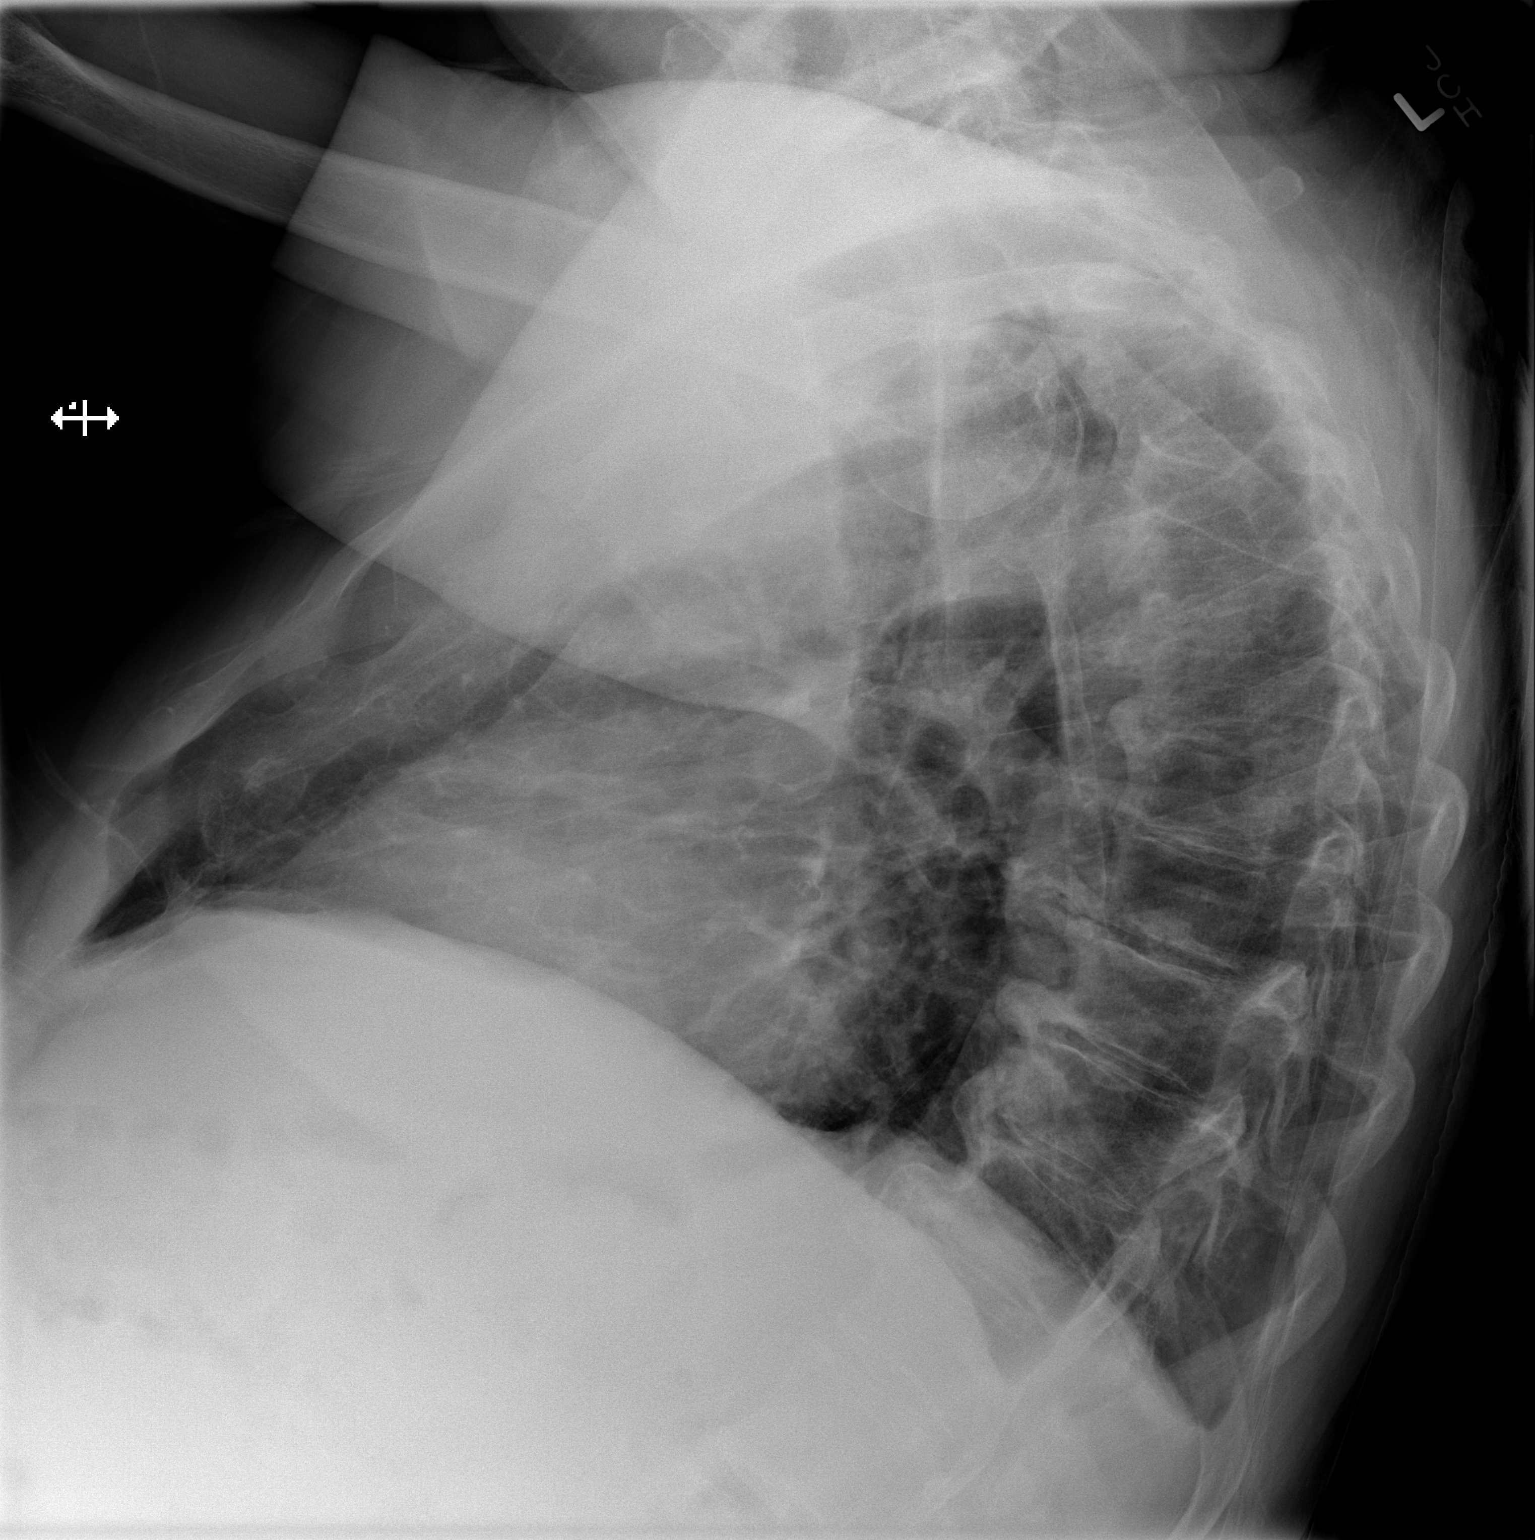

[x chest ap]
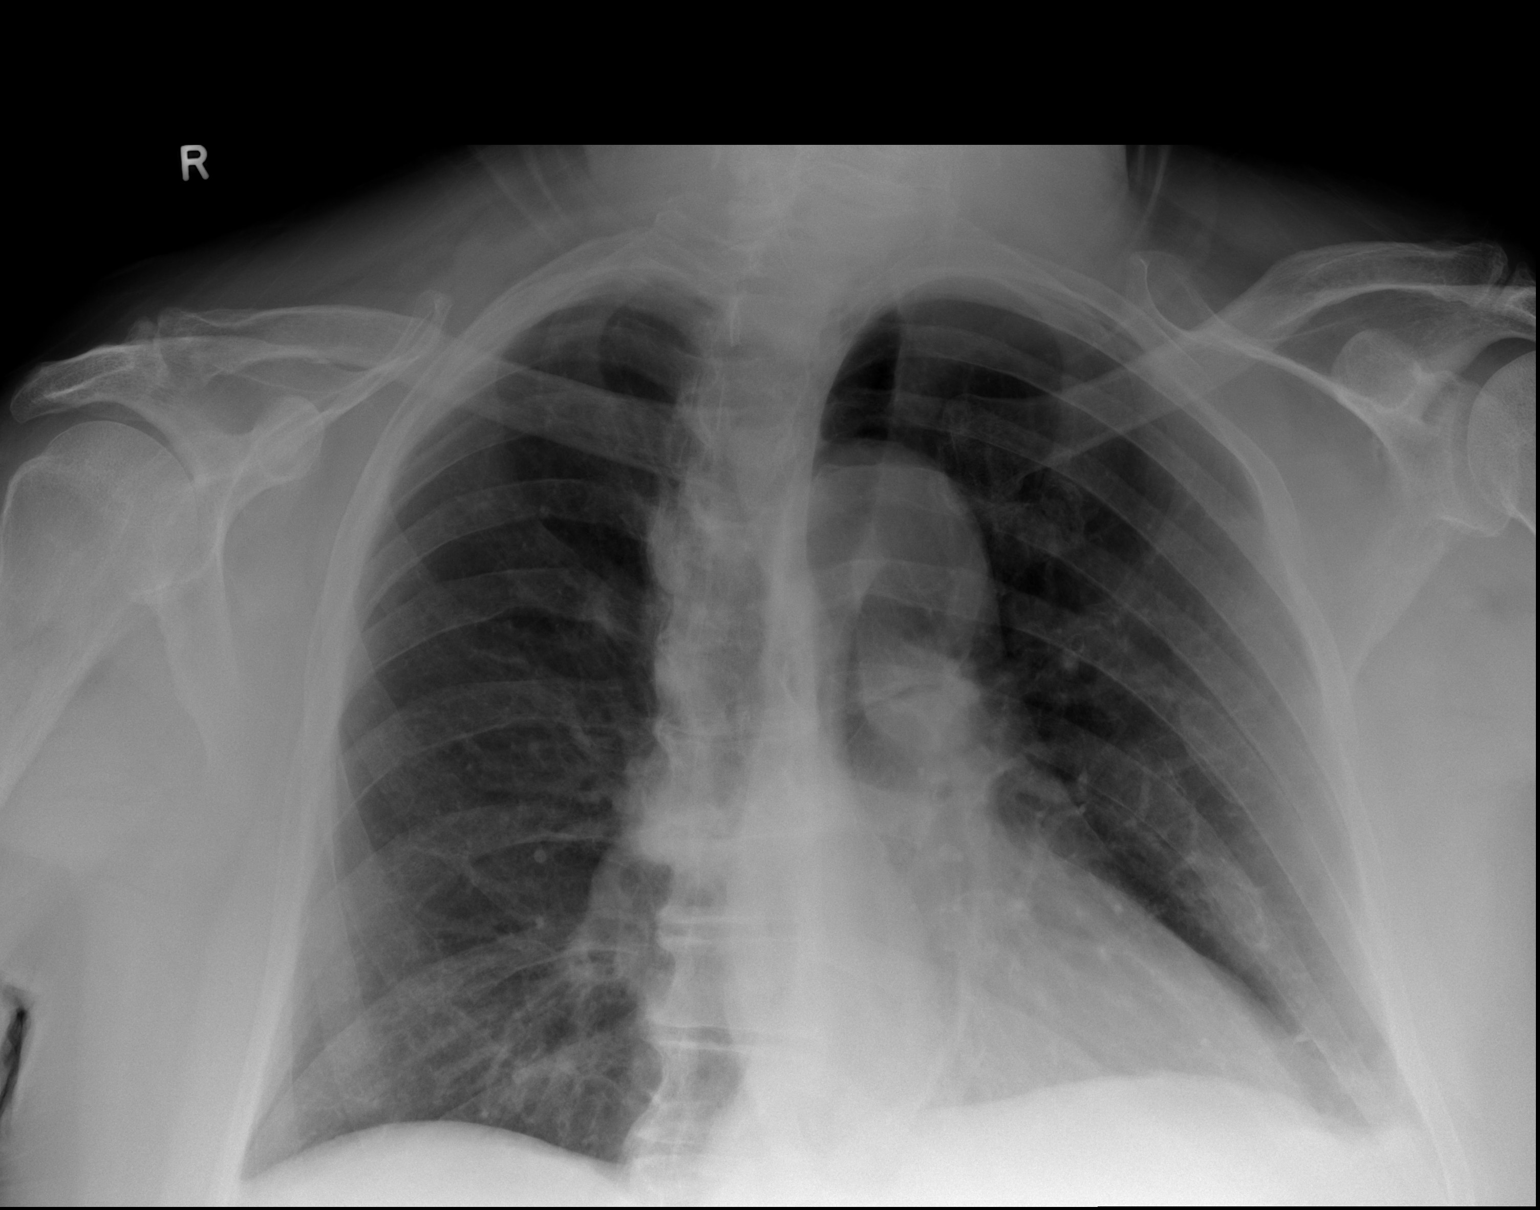

[2 of 2 positions shown; findings below may reference images not displayed]

FINDINGS: The cardiac silhouette, mediastinal and hilar contours
are within normal limits and stable given the rotation of the
patient.  The lungs are clear.  The bony thorax is intact.
Advanced generative changes are noted in the thoracic spine.
IMPRESSION: No acute cardiopulmonary findings.

## 2014-11-23 DIAGNOSIS — E118 Type 2 diabetes mellitus with unspecified complications: Secondary | ICD-10-CM | POA: Diagnosis not present

## 2014-11-30 DIAGNOSIS — E789 Disorder of lipoprotein metabolism, unspecified: Secondary | ICD-10-CM | POA: Diagnosis not present

## 2014-11-30 DIAGNOSIS — K210 Gastro-esophageal reflux disease with esophagitis: Secondary | ICD-10-CM | POA: Diagnosis not present

## 2014-11-30 DIAGNOSIS — E118 Type 2 diabetes mellitus with unspecified complications: Secondary | ICD-10-CM | POA: Diagnosis not present

## 2014-12-01 DIAGNOSIS — H35033 Hypertensive retinopathy, bilateral: Secondary | ICD-10-CM | POA: Diagnosis not present

## 2014-12-01 DIAGNOSIS — H2513 Age-related nuclear cataract, bilateral: Secondary | ICD-10-CM | POA: Diagnosis not present

## 2014-12-01 DIAGNOSIS — H25013 Cortical age-related cataract, bilateral: Secondary | ICD-10-CM | POA: Diagnosis not present

## 2014-12-01 DIAGNOSIS — H268 Other specified cataract: Secondary | ICD-10-CM | POA: Diagnosis not present

## 2014-12-01 DIAGNOSIS — H40013 Open angle with borderline findings, low risk, bilateral: Secondary | ICD-10-CM | POA: Diagnosis not present

## 2014-12-29 DIAGNOSIS — E119 Type 2 diabetes mellitus without complications: Secondary | ICD-10-CM | POA: Diagnosis not present

## 2015-01-03 DIAGNOSIS — H2512 Age-related nuclear cataract, left eye: Secondary | ICD-10-CM | POA: Diagnosis not present

## 2015-01-04 ENCOUNTER — Ambulatory Visit: Payer: Medicare Other | Admitting: Podiatry

## 2015-01-09 DIAGNOSIS — L309 Dermatitis, unspecified: Secondary | ICD-10-CM | POA: Diagnosis not present

## 2015-01-09 DIAGNOSIS — E118 Type 2 diabetes mellitus with unspecified complications: Secondary | ICD-10-CM | POA: Diagnosis not present

## 2015-01-09 DIAGNOSIS — I10 Essential (primary) hypertension: Secondary | ICD-10-CM | POA: Diagnosis not present

## 2015-01-11 ENCOUNTER — Ambulatory Visit (INDEPENDENT_AMBULATORY_CARE_PROVIDER_SITE_OTHER): Payer: Medicare Other | Admitting: Podiatry

## 2015-01-11 ENCOUNTER — Encounter: Payer: Self-pay | Admitting: Podiatry

## 2015-01-11 DIAGNOSIS — E119 Type 2 diabetes mellitus without complications: Secondary | ICD-10-CM

## 2015-01-11 DIAGNOSIS — M79673 Pain in unspecified foot: Secondary | ICD-10-CM

## 2015-01-11 DIAGNOSIS — M79609 Pain in unspecified limb: Principal | ICD-10-CM

## 2015-01-11 DIAGNOSIS — B351 Tinea unguium: Secondary | ICD-10-CM | POA: Diagnosis not present

## 2015-01-11 NOTE — Progress Notes (Signed)
Subjective:     Patient ID: Ricky S Skorupski Jr., male   DOB: 07/02/1940, 74 y.o.   MRN: [id number]  HPIThis patient presents to the office for preventive foot care services.  His nails have grown long thick and ingrown on second and third toes both feet.  He is diabetic with no complications.   Review of Systems     Objective:   Physical Exam GENERAL APPEARANCE: Alert, conversant. Appropriately groomed. No acute distress.  VASCULAR: Pedal pulses palpable at  [facility] and PT bilateral.  Capillary refill time is immediate to all digits,  Normal temperature gradient.  Digital hair growth is present bilateral  NEUROLOGIC: sensation is normal to 5.07 monofilament at 5/5 sites bilateral.  Light touch is intact bilateral, Muscle strength normal.  MUSCULOSKELETAL: acceptable muscle strength, tone and stability bilateral.  Intrinsic muscluature intact bilateral.  Rectus appearance of foot and digits noted bilateral.   DERMATOLOGIC: skin color, texture, and turgor are within normal limits.  No preulcerative lesions or ulcers  are seen, no interdigital maceration noted.  No open lesions present.  . No drainage noted. NAILS  Thick disfigured discolored nails both feet with no drainage or infection.      Assessment:     Onychomycosis     Plan:     Debridement of Nails both feet.  RTC 10 weeks.       

## 2015-01-19 ENCOUNTER — Ambulatory Visit (HOSPITAL_COMMUNITY)
Admission: RE | Admit: 2015-01-19 | Discharge: 2015-01-19 | Disposition: A | Discharge status: Home / Self Care | Payer: Medicare Other | Attending: Psychiatry | Admitting: Psychiatry

## 2015-01-30 DIAGNOSIS — H25011 Cortical age-related cataract, right eye: Secondary | ICD-10-CM | POA: Diagnosis not present

## 2015-01-30 DIAGNOSIS — H2511 Age-related nuclear cataract, right eye: Secondary | ICD-10-CM | POA: Diagnosis not present

## 2015-01-30 DIAGNOSIS — H25041 Posterior subcapsular polar age-related cataract, right eye: Secondary | ICD-10-CM | POA: Diagnosis not present

## 2015-02-14 DIAGNOSIS — H2511 Age-related nuclear cataract, right eye: Secondary | ICD-10-CM | POA: Diagnosis not present

## 2015-03-22 DIAGNOSIS — E559 Vitamin D deficiency, unspecified: Secondary | ICD-10-CM | POA: Diagnosis not present

## 2015-03-22 DIAGNOSIS — E118 Type 2 diabetes mellitus with unspecified complications: Secondary | ICD-10-CM | POA: Diagnosis not present

## 2015-03-26 ENCOUNTER — Ambulatory Visit (HOSPITAL_COMMUNITY)
Admission: RE | Admit: 2015-03-26 | Discharge: 2015-03-26 | Disposition: A | Discharge status: Home / Self Care | Payer: Medicare Other | Attending: Psychiatry | Admitting: Psychiatry

## 2015-03-26 NOTE — BH Assessment (Signed)
Tele Assessment Note   Ricky [name]. is an 75 y.o. male. Pt presents voluntarily to [facility] for an assessment BIB his caregiver Sandre Kitty. Pt is in a wheelchair and is able to use his legs to push the wheelchair. Pt reports he has lived for the past two years at [facility] and [place] works there. Pt is cooperative and oriented x 4. He says that he has been "religiously occupied" lately. Pt goes on to say that "a lot of my trouble is religion." He says that he can't listen to certain type of music b/c of his religious beliefs. Pt reports that he enjoys watching people dance, but this is against his religious. Pt appears calm. Pt denies SI and HI. No delusions noted. Pt reports at his baseline he hears a "voice". Pt reports the voice never tells him to do anything bad. He says that his psychiatrist, Dr Janell Quiet at [facility], is aware of pt's AH. He reports that he went to [facility] approx 45 years ago d/t schizophrenia. Pt says that he is compliant with his psych meds. Pt's affect is euthymic. He reports his mood is "just wanting to see if I need to come to the hospital." Pt goes on to speak more about his religious beliefs. He says, "I do believe I'm saved." He says, "I'm not depressed." He reports he sees a counselor about once every 6 mos. He says his next appt with Dr Clovis Pu is 03/28/15. Pt reports he enjoys praying for people, and people he knows do better on certain tests when he prays for them. Ricky Ala NP agrees with writer that pt doesn't meet inpatient criteria. Pt signs MSE decline form.   Diagnosis: Schizophrenia  Past Medical History:  Past Medical History  Diagnosis Date  . Diabetes mellitus without complication ([place])   . Hypertension   . Dementia   . Depression   . Arthritis   . Hyperlipidemia   . Cataract   . Urinary frequency   . Fatty liver   . Schizophrenia ([place])   . Peripheral edema   . Internal hemorrhoids   . Adenomatous colon polyp   . Type II  or unspecified type diabetes mellitus without mention of complication, not stated as uncontrolled   . Abnormality of gait   . Other malaise and fatigue     Past Surgical History  Procedure Laterality Date  . Tonsillectomy    . Esophagogastroduodenoscopy  02/21/2012    Procedure: ESOPHAGOGASTRODUODENOSCOPY (EGD);  Surgeon: Beryle Beams, MD;  Location: [facility];  Service: Endoscopy;  Laterality: N/A;  . Esophagogastroduodenoscopy N/A 05/22/2012    Procedure: ESOPHAGOGASTRODUODENOSCOPY (EGD);  Surgeon: Beryle Beams, MD;  Location: [facility];  Service: Endoscopy;  Laterality: N/A;  . Balloon dilation N/A 05/22/2012    Procedure: BALLOON DILATION;  Surgeon: Beryle Beams, MD;  Location: [facility];  Service: Endoscopy;  Laterality: N/A;  . Esophagogastroduodenoscopy N/A 06/17/2014    Procedure: ESOPHAGOGASTRODUODENOSCOPY (EGD);  Surgeon: Carol Ada, MD;  Location: [facility];  Service: Endoscopy;  Laterality: N/A;  . Savory dilation N/A 06/17/2014    Procedure: SAVORY DILATION;  Surgeon: Carol Ada, MD;  Location: [facility];  Service: Endoscopy;  Laterality: N/A;  . Esophagogastroduodenoscopy N/A 08/10/2014    Procedure: ESOPHAGOGASTRODUODENOSCOPY (EGD);  Surgeon: Carol Ada, MD;  Location: [facility];  Service: Endoscopy;  Laterality: N/A;    Family History:  Family History  Problem Relation Age of Onset  . Brain cancer Father  Social History:  reports that he has never smoked. He has never used smokeless tobacco. He reports that he does not drink alcohol or use illicit drugs.  Additional Social History:  Alcohol / Drug Use Pain Medications: pt denies abuse - see meds list Prescriptions: pt denies abuse - see meds list Over the Counter: pt denies abuse - see meds list History of alcohol / drug use?: No history of alcohol / drug abuse Longest period of sobriety (when/how long): n/a  CIWA:   COWS:    PATIENT STRENGTHS: (choose at least two) Ability for  insight Average or above average intelligence Communication skills Religious Affiliation  Allergies:  Allergies  Allergen Reactions  . Asa [Aspirin]     "Dr told me not to take it"  . Codeine Other (See Comments)    DIZZINESS with Tylenol 3  . Tylenol [Acetaminophen] Other (See Comments)    Dizziness with tylenol 3    Home Medications:  (Not in a hospital admission)  OB/GYN Status:  No LMP for male patient.  General Assessment Data Location of Assessment: [facility] Assessment Services TTS Assessment: In system Is this a Tele or Face-to-Face Assessment?: Face-to-Face Is this an Initial Assessment or a Re-assessment for this encounter?: Initial Assessment Marital status: Single Is patient pregnant?: No Pregnancy Status: No Living Arrangements: Other (Comment) ([facility]) Can pt return to current living arrangement?: Yes Admission Status: Voluntary Is patient capable of signing voluntary admission?: Yes Referral Source: Self/Family/Friend Insurance type: united healthcare medicare  Medical Screening Exam ([place]) Medical Exam completed: No Reason for MSE not completed: Patient Refused (pt signed MSE decline form)  Crisis Care Plan Living Arrangements: Other (Comment) ([facility]) Name of Psychiatrist: Dr Janell Quiet - [facility] Name of Therapist: unknown name  Education Status Is patient currently in school?: No Highest grade of school patient has completed: 25 Name of school: Started at [employer], but [employer] grad  Risk to self with the past 6 months Suicidal Ideation: No Has patient been a risk to self within the past 6 months prior to admission? : No Suicidal Intent: No Has patient had any suicidal intent within the past 6 months prior to admission? : No Is patient at risk for suicide?: No Suicidal Plan?: No Has patient had any suicidal plan within the past 6 months prior to admission? : No Access to Means: No What has been your  use of drugs/alcohol within the last 12 months?: none Previous Attempts/Gestures: No How many times?: 0 Other Self Harm Risks: none Triggers for Past Attempts:  (na) Intentional Self Injurious Behavior: None Family Suicide History: No Recent stressful life event(s):  (pt denies stressors) Persecutory voices/beliefs?: No Depression: No Substance abuse history and/or treatment for substance abuse?: No Suicide prevention information given to non-admitted patients: Not applicable  Risk to Others within the past 6 months Homicidal Ideation: No Does patient have any lifetime risk of violence toward others beyond the six months prior to admission? : No Thoughts of Harm to Others: No Current Homicidal Intent: No Current Homicidal Plan: No Access to Homicidal Means: No Identified Victim: none History of harm to others?: No Assessment of Violence: None Noted Violent Behavior Description: pt denies hx violence - is calm and cooperative Does patient have access to weapons?: No Criminal Charges Pending?: No Does patient have a court date: No Is patient on probation?: No  Psychosis Hallucinations: Auditory (pt reports this is his baseline to have AH without command) Delusions: None noted  Mental  Status Report Appearance/Hygiene: Unremarkable, Other (Comment) (in wheelchair, in street clothes) Eye Contact: Good Motor Activity: Freedom of movement Speech: Logical/coherent Level of Consciousness: Alert Mood: Euthymic Affect: Other (Comment) (euthymic) Anxiety Level: None Thought Processes: Coherent, Relevant Judgement: Unimpaired Orientation: Situation, Time, Place, Person Obsessive Compulsive Thoughts/Behaviors: None  Cognitive Functioning Concentration: Normal Memory: Recent Impaired, Remote Intact IQ: Average Insight: Good Impulse Control: Good Appetite: Good Sleep: No Change Total Hours of Sleep: 8 Vegetative Symptoms: None  ADLScreening [facility] Assessment  Services) Patient's cognitive ability adequate to safely complete daily activities?: Yes Patient able to express need for assistance with ADLs?: Yes Independently performs ADLs?: No  Prior Inpatient Therapy Prior Inpatient Therapy: Yes Prior Therapy Dates: 45 yrs ago Prior Therapy Facilty/Provider(s): [facility] Reason for Treatment: schizophrenia  Prior Outpatient Therapy Prior Outpatient Therapy: Yes Prior Therapy Dates: currently Prior Therapy Facilty/Provider(s): [facility] at [facility] Reason for Treatment: med management Does patient have an ACCT team?: No Does patient have Intensive In-House Services?  : No Does patient have [facility] services? : No Does patient have P4CC services?: Unknown  ADL Screening (condition at time of admission) Patient's cognitive ability adequate to safely complete daily activities?: Yes Is the patient deaf or have difficulty hearing?: No Does the patient have difficulty seeing, even when wearing glasses/contacts?: No Does the patient have difficulty concentrating, remembering, or making decisions?: Yes Patient able to express need for assistance with ADLs?: Yes Does the patient have difficulty dressing or bathing?: Yes Independently performs ADLs?: No Communication: Independent Dressing (OT): Independent Grooming: Independent Feeding: Independent Bathing: Needs assistance Is this a change from baseline?: Pre-admission baseline Toileting: Independent with device (comment) In/Out Bed: Needs assistance Walks in Home: Dependent Is this a change from baseline?: Pre-admission baseline Does the patient have difficulty walking or climbing stairs?: Yes Weakness of Legs: Both Weakness of Arms/Hands: Both  Home Assistive Devices/Equipment Home Assistive Devices/Equipment: Wheelchair    Abuse/Neglect Assessment (Assessment to be complete while patient is alone) Physical Abuse: Denies Verbal Abuse: Denies Sexual Abuse: Denies Exploitation of  patient/patient's resources: Denies Self-Neglect: Denies     Regulatory affairs officer (For Healthcare) Does patient have an advance directive?: No Would patient like information on creating an advanced directive?: No - patient declined information    Additional Information 1:1 In Past 12 Months?: No CIRT Risk: No Elopement Risk: No Does patient have medical clearance?: No     Disposition:  Disposition Initial Assessment Completed for this Encounter: Yes Disposition of Patient: Other dispositions Other disposition(s): To current provider (tanika lewis np rec discharge & follow up with Dr Clovis Pu)  Aundra Dubin, [place] 03/26/2015 11:38 AM

## 2015-03-29 DIAGNOSIS — E118 Type 2 diabetes mellitus with unspecified complications: Secondary | ICD-10-CM | POA: Diagnosis not present

## 2015-03-29 DIAGNOSIS — E789 Disorder of lipoprotein metabolism, unspecified: Secondary | ICD-10-CM | POA: Diagnosis not present

## 2015-03-29 DIAGNOSIS — N390 Urinary tract infection, site not specified: Secondary | ICD-10-CM | POA: Diagnosis not present

## 2015-03-30 ENCOUNTER — Ambulatory Visit (INDEPENDENT_AMBULATORY_CARE_PROVIDER_SITE_OTHER): Payer: Medicare Other | Admitting: Podiatry

## 2015-03-30 ENCOUNTER — Encounter: Payer: Self-pay | Admitting: Podiatry

## 2015-03-30 DIAGNOSIS — M79609 Pain in unspecified limb: Principal | ICD-10-CM

## 2015-03-30 DIAGNOSIS — M79673 Pain in unspecified foot: Secondary | ICD-10-CM | POA: Diagnosis not present

## 2015-03-30 DIAGNOSIS — B351 Tinea unguium: Secondary | ICD-10-CM | POA: Diagnosis not present

## 2015-03-30 DIAGNOSIS — E119 Type 2 diabetes mellitus without complications: Secondary | ICD-10-CM

## 2015-03-30 DIAGNOSIS — N390 Urinary tract infection, site not specified: Secondary | ICD-10-CM | POA: Diagnosis not present

## 2015-03-30 NOTE — Progress Notes (Signed)
Subjective:     Patient ID: Ricky [name]., male   DOB: March 11, 1940, 74 y.o.   MRN: [id number]  HPIThis patient presents to the office for preventive foot care services.  His nails have grown long thick and ingrown on second and third toes both feet.  He is diabetic with no complications. Healing skin lesion right big toe.   Review of Systems     Objective:   Physical Exam GENERAL APPEARANCE: Alert, conversant. Appropriately groomed. No acute distress.  VASCULAR: Pedal pulses palpable at  [facility] and PT bilateral.  Capillary refill time is immediate to all digits,  Normal temperature gradient.  Digital hair growth is present bilateral  NEUROLOGIC: sensation is normal to 5.07 monofilament at 5/5 sites bilateral.  Light touch is intact bilateral, Muscle strength normal.  MUSCULOSKELETAL: acceptable muscle strength, tone and stability bilateral.  Intrinsic muscluature intact bilateral.  Rectus appearance of foot and digits noted bilateral. Hallux malleus B/L  DERMATOLOGIC: skin color, texture, and turgor are within normal limits.  No preulcerative lesions or ulcers  are seen, no interdigital maceration noted.  No open lesions present.  . No drainage noted. Healing skin lesion right hallux. NAILS  Thick disfigured discolored nails both feet with no drainage or infection.      Assessment:     Onychomycosis     Plan:     Debridement of Nails both feet.  RTC 10 weeks.  Told to wear closed in shoes only when out.   Gardiner Barefoot DPM

## 2015-04-25 DIAGNOSIS — E118 Type 2 diabetes mellitus with unspecified complications: Secondary | ICD-10-CM | POA: Diagnosis not present

## 2015-04-29 DIAGNOSIS — E559 Vitamin D deficiency, unspecified: Secondary | ICD-10-CM | POA: Diagnosis not present

## 2015-05-02 DIAGNOSIS — E118 Type 2 diabetes mellitus with unspecified complications: Secondary | ICD-10-CM | POA: Diagnosis not present

## 2015-05-22 DIAGNOSIS — H40013 Open angle with borderline findings, low risk, bilateral: Secondary | ICD-10-CM | POA: Diagnosis not present

## 2015-05-22 DIAGNOSIS — H1859 Other hereditary corneal dystrophies: Secondary | ICD-10-CM | POA: Diagnosis not present

## 2015-05-22 DIAGNOSIS — H524 Presbyopia: Secondary | ICD-10-CM | POA: Diagnosis not present

## 2015-05-22 DIAGNOSIS — Z961 Presence of intraocular lens: Secondary | ICD-10-CM | POA: Diagnosis not present

## 2015-05-24 DIAGNOSIS — D649 Anemia, unspecified: Secondary | ICD-10-CM | POA: Diagnosis not present

## 2015-05-24 DIAGNOSIS — I10 Essential (primary) hypertension: Secondary | ICD-10-CM | POA: Diagnosis not present

## 2015-05-24 DIAGNOSIS — E785 Hyperlipidemia, unspecified: Secondary | ICD-10-CM | POA: Diagnosis not present

## 2015-05-28 ENCOUNTER — Encounter (HOSPITAL_COMMUNITY)
Admission: EM | Disposition: A | Discharge status: Home / Self Care | Payer: Self-pay | Source: Home / Self Care | Attending: Emergency Medicine

## 2015-05-28 ENCOUNTER — Encounter (HOSPITAL_COMMUNITY): Payer: Self-pay | Admitting: Emergency Medicine

## 2015-05-28 ENCOUNTER — Ambulatory Visit (HOSPITAL_COMMUNITY)
Admission: EM | Admit: 2015-05-28 | Discharge: 2015-05-29 | Disposition: A | Discharge status: Home / Self Care | Payer: Medicare Other | Attending: Emergency Medicine | Admitting: Emergency Medicine

## 2015-05-28 DIAGNOSIS — T17320A Food in larynx causing asphyxiation, initial encounter: Secondary | ICD-10-CM | POA: Diagnosis not present

## 2015-05-28 DIAGNOSIS — E785 Hyperlipidemia, unspecified: Secondary | ICD-10-CM | POA: Insufficient documentation

## 2015-05-28 DIAGNOSIS — K449 Diaphragmatic hernia without obstruction or gangrene: Secondary | ICD-10-CM | POA: Diagnosis not present

## 2015-05-28 DIAGNOSIS — F0390 Unspecified dementia without behavioral disturbance: Secondary | ICD-10-CM | POA: Insufficient documentation

## 2015-05-28 DIAGNOSIS — K222 Esophageal obstruction: Secondary | ICD-10-CM | POA: Diagnosis not present

## 2015-05-28 DIAGNOSIS — K2270 Barrett's esophagus without dysplasia: Secondary | ICD-10-CM | POA: Insufficient documentation

## 2015-05-28 DIAGNOSIS — F209 Schizophrenia, unspecified: Secondary | ICD-10-CM | POA: Diagnosis not present

## 2015-05-28 DIAGNOSIS — I10 Essential (primary) hypertension: Secondary | ICD-10-CM | POA: Diagnosis not present

## 2015-05-28 DIAGNOSIS — M1990 Unspecified osteoarthritis, unspecified site: Secondary | ICD-10-CM | POA: Diagnosis not present

## 2015-05-28 DIAGNOSIS — Z794 Long term (current) use of insulin: Secondary | ICD-10-CM | POA: Diagnosis not present

## 2015-05-28 DIAGNOSIS — E119 Type 2 diabetes mellitus without complications: Secondary | ICD-10-CM | POA: Diagnosis not present

## 2015-05-28 DIAGNOSIS — Z791 Long term (current) use of non-steroidal anti-inflammatories (NSAID): Secondary | ICD-10-CM | POA: Diagnosis not present

## 2015-05-28 DIAGNOSIS — Z79899 Other long term (current) drug therapy: Secondary | ICD-10-CM | POA: Insufficient documentation

## 2015-05-28 DIAGNOSIS — T18128A Food in esophagus causing other injury, initial encounter: Secondary | ICD-10-CM | POA: Insufficient documentation

## 2015-05-28 DIAGNOSIS — W44F3XA Food entering into or through a natural orifice, initial encounter: Secondary | ICD-10-CM | POA: Insufficient documentation

## 2015-05-28 HISTORY — PX: ESOPHAGOGASTRODUODENOSCOPY: SHX5428

## 2015-05-28 SURGERY — EGD (ESOPHAGOGASTRODUODENOSCOPY)
Anesthesia: Moderate Sedation

## 2015-05-28 MED ORDER — SODIUM CHLORIDE 0.9 % IV SOLN
INTRAVENOUS | Status: DC
  Administered 2015-05-28: 21:00:00 via INTRAVENOUS

## 2015-05-28 MED ORDER — FENTANYL CITRATE (PF) 100 MCG/2ML IJ SOLN
INTRAMUSCULAR | Status: DC | PRN
  Administered 2015-05-28 (×2): 25 ug via INTRAVENOUS

## 2015-05-28 MED ORDER — FENTANYL CITRATE (PF) 100 MCG/2ML IJ SOLN
INTRAMUSCULAR | Status: AC
  Filled 2015-05-28: qty 4

## 2015-05-28 MED ORDER — BUTAMBEN-TETRACAINE-BENZOCAINE 2-2-14 % EX AERO
INHALATION_SPRAY | CUTANEOUS | Status: DC | PRN
  Administered 2015-05-28: 1 via TOPICAL

## 2015-05-28 MED ORDER — GLUCAGON HCL RDNA (DIAGNOSTIC) 1 MG IJ SOLR
1.0000 mg | Freq: Once | INTRAMUSCULAR | Status: AC
  Administered 2015-05-28: 1 mg via INTRAVENOUS
  Filled 2015-05-28: qty 1

## 2015-05-28 MED ORDER — MIDAZOLAM HCL 10 MG/2ML IJ SOLN
INTRAMUSCULAR | Status: DC | PRN
Start: 2015-05-28 — End: 2015-05-28
  Administered 2015-05-28 (×2): 2 mg via INTRAVENOUS
  Administered 2015-05-28 (×2): 1 mg via INTRAVENOUS

## 2015-05-28 MED ORDER — DIPHENHYDRAMINE HCL 50 MG/ML IJ SOLN
INTRAMUSCULAR | Status: AC
  Filled 2015-05-28: qty 1

## 2015-05-28 MED ORDER — MIDAZOLAM HCL 5 MG/ML IJ SOLN
INTRAMUSCULAR | Status: AC
  Filled 2015-05-28: qty 2

## 2015-05-28 MED ORDER — SODIUM CHLORIDE 0.9 % IV SOLN: INTRAVENOUS | Status: DC

## 2015-05-28 NOTE — Discharge Instructions (Signed)
YOU HAD AN ENDOSCOPIC PROCEDURE TODAY: Refer to the procedure report and other information in the discharge instructions given to you for any specific questions about what was found during the examination. If this information does not answer your questions, please call Dr. Celesta Aver office at [phone] to clarify. Call  Dr. Benson Norway after 59 AM 05/29/2015  Food was removed from the esophagus. It was pushed into the stomach. You may need another dilation of the esophagus (by Dr. Benson Norway)  [place]: Some feelings of bloating in the abdomen. Passage of more gas than usual. Walking can help get rid of the air that was put into your GI tract during the procedure and reduce the bloating. If you had a lower endoscopy (such as a colonoscopy or flexible sigmoidoscopy) you may notice spotting of blood in your stool or on the toilet paper. Some abdominal soreness may be present for a day or two, also.  DIET: Drink plenty of fluids but you should avoid alcoholic beverages for 24 hours. Try soft foods only for next 24 hours then try normal diet but cut food small and chew well. Try Dysphagia 3 diet - see attached.  ACTIVITY: Your care partner should take you home directly after the procedure. You should plan to take it easy, moving slowly for the rest of the day. You can resume normal activity the day after the procedure however YOU SHOULD NOT DRIVE, use power tools, machinery or perform tasks that involve climbing or major physical exertion for 24 hours (because of the sedation medicines used during the test).   SYMPTOMS TO REPORT IMMEDIATELY: A gastroenterologist can be reached at any hour. Please call [phone]  for any of the following symptoms:  Following upper endoscopy (EGD, EUS, ERCP, esophageal dilation) Vomiting of blood or coffee ground material  New, significant abdominal pain  New, significant chest pain or pain under the shoulder blades  Painful or persistently difficult swallowing  New  shortness of breath  Black, tarry-looking or red, bloody stools fever

## 2015-05-28 NOTE — ED Provider Notes (Signed)
CSN: [id number]     Arrival date & time 05/28/15  1753 History   First MD Initiated Contact with Patient 05/28/15 1820     Chief Complaint  Patient presents with  . Foreign Body     (Consider location/radiation/quality/duration/timing/severity/associated sxs/prior Treatment) HPI   "Ricky [name]" is a 75 year old male with symptoms of possible esophageal FB. He was eating [place] [employer] this evening when he felt something "go down my windpipe." He has had persistent FB sensation since then. No dyspnea. No cough. No drooling. When he tries to drink though he spits it back up. He has a past history dysphagia. He has previously had EGD by Dr. Benson Norway with esophageal dilation. His most recent EGD appears to have been last summer when Dr Benson Norway pulled out a large piece of poorly chewed ham.   Past Medical History  Diagnosis Date  . Diabetes mellitus without complication ([place])   . Hypertension   . Dementia   . Depression   . Arthritis   . Hyperlipidemia   . Cataract   . Urinary frequency   . Fatty liver   . Schizophrenia ([place])   . Peripheral edema   . Internal hemorrhoids   . Adenomatous colon polyp   . Type II or unspecified type diabetes mellitus without mention of complication, not stated as uncontrolled   . Abnormality of gait   . Other malaise and fatigue    Past Surgical History  Procedure Laterality Date  . Tonsillectomy    . Esophagogastroduodenoscopy  02/21/2012    Procedure: ESOPHAGOGASTRODUODENOSCOPY (EGD);  Surgeon: Beryle Beams, MD;  Location: [facility];  Service: Endoscopy;  Laterality: N/A;  . Esophagogastroduodenoscopy N/A 05/22/2012    Procedure: ESOPHAGOGASTRODUODENOSCOPY (EGD);  Surgeon: Beryle Beams, MD;  Location: [facility];  Service: Endoscopy;  Laterality: N/A;  . Balloon dilation N/A 05/22/2012    Procedure: BALLOON DILATION;  Surgeon: Beryle Beams, MD;  Location: [facility] END[facility]COPY;  Service: Endoscopy;  Laterality: N/A;  . Esophagogastroduodenoscopy N/A  06/17/2014    Procedure: ESOPHAGOGASTRODUODENOSCOPY (EGD);  Surgeon: Carol Ada, MD;  Location: [facility];  Service: Endoscopy;  Laterality: N/A;  . Savory dilation N/A 06/17/2014    Procedure: SAVORY DILATION;  Surgeon: Carol Ada, MD;  Location: [facility];  Service: Endoscopy;  Laterality: N/A;  . Esophagogastroduodenoscopy N/A 08/10/2014    Procedure: ESOPHAGOGASTRODUODENOSCOPY (EGD);  Surgeon: Carol Ada, MD;  Location: [facility];  Service: Endoscopy;  Laterality: N/A;   Family History  Problem Relation Age of Onset  . Brain cancer Father    Social History  Substance Use Topics  . Smoking status: Never Smoker   . Smokeless tobacco: Never Used  . Alcohol Use: No    Review of Systems    Allergies  Asa; Codeine; and Tylenol  Home Medications   Prior to Admission medications   Medication Sig Start Date End Date Taking? Authorizing Provider  ALPRAZolam Duanne Moron) 0.5 MG tablet Take 0.5 mg by mouth at bedtime.   Yes Historical Provider, MD  amLODipine (NORVASC) 5 MG tablet Take 5 mg by mouth daily. 09/04/12  Yes Historical Provider, MD  Calcium Citrate-Vitamin D (CITRACAL + D PO) Take 1 tablet by mouth daily.   Yes Historical Provider, MD  chlorhexidine (PERIDEX) 0.12 % solution Use as directed 15 mLs in the mouth or throat every evening.   Yes Historical Provider, MD  Cholecalciferol (VITAMIN D-3) 1000 units CAPS Take 1 capsule by mouth daily.   Yes Historical Provider, MD  empagliflozin (JARDIANCE)  10 MG TABS tablet Take 10 mg by mouth daily.   Yes Historical Provider, MD  ergocalciferol (VITAMIN D2) 50000 UNITS capsule Take 50,000 Units by mouth once a week.   Yes Historical Provider, MD  fluvoxaMINE (LUVOX) 100 MG tablet Take 100 mg by mouth at bedtime.   Yes Historical Provider, MD  furosemide (LASIX) 20 MG tablet Take 20 mg by mouth daily before breakfast.  12/07/11  Yes Historical Provider, MD  insulin aspart (NOVOLOG) 100 UNIT/ML injection Inject 2-10 Units into  the skin 3 (three) times daily with meals. 150-200 3 units  201-250 4 units 251-300 6 units 301-350 8 units  351-400 10 units 400> call MD   Yes Historical Provider, MD  insulin detemir (LEVEMIR) 100 UNIT/ML injection Inject 25 Units into the skin at bedtime.   Yes Historical Provider, MD  meloxicam (MOBIC) 15 MG tablet Take 15 mg by mouth daily.   Yes Historical Provider, MD  metFORMIN (GLUCOPHAGE) 500 MG tablet Take 500 mg by mouth 2 (two) times daily with a meal.   Yes Historical Provider, MD  Multiple Vitamins-Minerals (DECUBI-VITE PO) Take 1 tablet by mouth daily.   Yes Historical Provider, MD  OLANZapine (ZYPREXA) 20 MG tablet Take 20 mg by mouth at bedtime.   Yes Historical Provider, MD  omega-3 acid ethyl esters (LOVAZA) 1 G capsule Take 1 g by mouth daily.   Yes Historical Provider, MD  omeprazole (PRILOSEC) 40 MG capsule Take 40 mg by mouth 2 (two) times daily.  09/28/12  Yes Historical Provider, MD  ONGLYZA 5 MG TABS tablet Take 5 mg by mouth daily before breakfast.  12/06/11  Yes Historical Provider, MD  pravastatin (PRAVACHOL) 40 MG tablet Take 40 mg by mouth daily.   Yes Historical Provider, MD  traMADol (ULTRAM) 50 MG tablet Take 50 mg by mouth every 6 (six) hours as needed for moderate pain.   Yes Historical Provider, MD   BP 149/81 mmHg  Pulse 102  Temp(Src) 98.1 F (36.7 C) (Oral)  Resp 20  SpO2 96% Physical Exam  Constitutional: He appears well-developed and well-nourished. No distress.  HENT:  Head: Normocephalic and atraumatic.  posterior pharynx clear. Normal sounding voice.handling secretions.   Eyes: Conjunctivae are normal. Right eye exhibits no discharge. Left eye exhibits no discharge.  Neck: Neck supple.  Cardiovascular: Normal rate, regular rhythm and normal heart sounds.  Exam reveals no gallop and no friction rub.   No murmur heard. Pulmonary/Chest: Effort normal and breath sounds normal. No respiratory distress.  Abdominal: Soft. He exhibits no  distension. There is no tenderness.  Musculoskeletal: He exhibits no edema or tenderness.  Neurological: He is alert.  Skin: Skin is warm and dry.  Psychiatric: He has a normal mood and affect. His behavior is normal. Thought content normal.  Nursing note and vitals reviewed.   ED Course  Procedures (including critical care time) Labs Review Labs Reviewed - No data to display  Imaging Review No results found. I have personally reviewed and evaluated these images and lab results as part of my medical decision-making.   EKG Interpretation None      MDM   Final diagnoses:  Food impaction of esophagus, initial encounter    74yM with possible esophageal FB. No respiratory distress. He is handling secretions. I watched him try to drink some water thouhh and he just spits it back up. Will place IV and try glucagon. IVF. Will discuss with GI.     Virgel Manifold, MD 06/01/15 1253

## 2015-05-28 NOTE — ED Notes (Signed)
Attempted an IV x 2 with being unsuccessful. Will inquire about another nurse obtaining IV.

## 2015-05-28 NOTE — Op Note (Signed)
[facility] Patient Name: Ricky [name] Procedure Date: 05/28/2015 MRN: [id number] Attending MD: Gatha Mayer , MD Date of Birth: July 03, 1940 CSN: [id number] Age: 75 Admit Type: Outpatient Procedure:                Upper GI endoscopy Indications:              Therapeutic procedure, Foreign body in the esophagus Providers:                Gatha Mayer, MD, Kingsley Plan, RN, Elspeth Cho, Technician Referring MD:              Medicines:                Midazolam 6 mg IV, Fentanyl 50 micrograms IV,                            Cetacaine spray Complications:            No immediate complications. Estimated Blood Loss:     Estimated blood loss was minimal. Procedure:                Pre-Anesthesia Assessment:                           - Prior to the procedure, a History and Physical                            was performed, and patient medications and                            allergies were reviewed. The patient's tolerance of                            previous anesthesia was also reviewed. The risks                            and benefits of the procedure and the sedation                            options and risks were discussed with the patient.                            All questions were answered, and informed consent                            was obtained. Prior Anticoagulants: The patient has                            taken no previous anticoagulant or antiplatelet                            agents. ASA Grade Assessment: III - A patient with  severe systemic disease. After reviewing the risks                            and benefits, the patient was deemed in                            satisfactory condition to undergo the procedure.                           After obtaining informed consent, the endoscope was                            passed under direct vision. Throughout the   procedure, the patient's blood pressure, pulse, and                            oxygen saturations were monitored continuously. The                            Endosonoscope was introduced through the mouth, and                            advanced to the antrum of the stomach. The upper GI                            endoscopy was accomplished without difficulty. The                            patient tolerated the procedure well. Scope In: Scope Out: Findings:      Food was found in the upper third of the esophagus. Estimated blood loss       was minimal. Removal of food was accomplished. Gently advancved to       stomach w/o problems.      A moderate Schatzki ring (acquired) was found in the upper third of the       esophagus. Mild trauma.      There were esophageal mucosal changes secondary to established       long-segment Barrett's disease present in the entire esophagus. The       maximum longitudinal extent of these mucosal changes was 23 cm in       length. 25-23 cm looked somewhat inflamed.      A small hiatal hernia was present.      The exam was otherwise without abnormality. Impression:               - Food in the upper third of the esophagus. Removal                            was successful.                           - Moderate Schatzki ring.                           - Esophageal mucosal changes secondary to  established long-segment Barrett's disease.                           - Small hiatal hernia.                           - The examination was otherwise normal. Moderate Sedation:      Moderate (conscious) sedation was administered by the endoscopy nurse       and supervised by the endoscopist. The following parameters were       monitored: oxygen saturation, heart rate, blood pressure, respiratory       rate, EKG, adequacy of pulmonary ventilation, and response to care.       Total physician intraservice time was 15 minutes. Recommendation:            - Patient has a contact number available for                            emergencies. The signs and symptoms of potential                            delayed complications were discussed with the                            patient. Return to normal activities tomorrow.                            Written discharge instructions were provided to the                            patient.                           - Clear liquids x 1 hour then soft foods rest of                            day. Start prior diet tomorrow.                           - Continue present medications.                           - Return to GI office Call Dr. Benson Norway to schedule                            follow-up. Procedure Code(s):        --- Professional ---                           [phone], Esophagoscopy, flexible, transoral; with                            removal of foreign body(s)                           G0500, Moderate sedation services provided by the  same physician or other qualified health care                            professional performing a gastrointestinal                            endoscopic service that sedation supports,                            requiring the presence of an independent trained                            observer to assist in the monitoring of the                            patient's level of consciousness and physiological                            status; initial 15 minutes of intra-service time;                            patient age 40 years or older (additional time may                            be reported with [phone], as appropriate) Diagnosis Code(s):        --- Professional ---                           [phone], Food in esophagus causing other injury,                            initial encounter                           K22.2, Esophageal obstruction                           K22.70, Barrett's esophagus without dysplasia                            K44.9, Diaphragmatic hernia without obstruction or                            gangrene                           T18.108A, Unspecified foreign body in esophagus                            causing other injury, initial encounter CPT copyright 2016 [employer]. All rights reserved. The codes documented in this report are preliminary and upon coder review may  be revised to meet current compliance requirements. Gatha Mayer, MD 05/28/2015 10:42:49 PM This report has been signed electronically. Number of Addenda: 0

## 2015-05-28 NOTE — ED Notes (Signed)
Pt from [facility] after he choked on a piece of chicken this morning. Pt is in no acute distress at this time. Was given medication for acid reflux at 1630 and he was able to swallow the pill. Alert and oriented.

## 2015-05-28 NOTE — ED Notes (Signed)
Endoscopy has informed that the team will be down shortly to perform an endoscopy on patient.

## 2015-05-28 NOTE — ED Notes (Signed)
Endoscopy at bedside. 

## 2015-05-28 NOTE — ED Notes (Signed)
Pt is in no distress and airway is in tact.  Pt comfortably watching television.

## 2015-05-28 NOTE — H&P (Signed)
[facility] History and Physical   Primary Care Physician:  Dwan Bolt, MD   Reason for Procedure:   Food impaction - esophagus  Plan:    Upper endoscopy and removed food. The risks and benefits as well as alternatives of endoscopic procedure(s) have been discussed and reviewed. All questions answered. The patient agrees to proceed.      HPI: Ricky [name]. is a 75 y.o. male w/ hx recurrent food impactions, large hiatalhernia. Did no chew piece of chicken well today and it feels lodged in esophagus like in past. Can swallow some secretions only.   Past Medical History  Diagnosis Date  . Diabetes mellitus without complication ([place])   . Hypertension   . Dementia   . Depression   . Arthritis   . Hyperlipidemia   . Cataract   . Urinary frequency   . Fatty liver   . Schizophrenia ([place])   . Peripheral edema   . Internal hemorrhoids   . Adenomatous colon polyp   . Type II or unspecified type diabetes mellitus without mention of complication, not stated as uncontrolled   . Abnormality of gait   . Other malaise and fatigue     Past Surgical History  Procedure Laterality Date  . Tonsillectomy    . Esophagogastroduodenoscopy  02/21/2012    Procedure: ESOPHAGOGASTRODUODENOSCOPY (EGD);  Surgeon: Beryle Beams, MD;  Location: [facility];  Service: Endoscopy;  Laterality: N/A;  . Esophagogastroduodenoscopy N/A 05/22/2012    Procedure: ESOPHAGOGASTRODUODENOSCOPY (EGD);  Surgeon: Beryle Beams, MD;  Location: [facility];  Service: Endoscopy;  Laterality: N/A;  . Balloon dilation N/A 05/22/2012    Procedure: BALLOON DILATION;  Surgeon: Beryle Beams, MD;  Location: [facility];  Service: Endoscopy;  Laterality: N/A;  . Esophagogastroduodenoscopy N/A 06/17/2014    Procedure: ESOPHAGOGASTRODUODENOSCOPY (EGD);  Surgeon: Carol Ada, MD;  Location: [facility];  Service: Endoscopy;  Laterality: N/A;  . Savory dilation N/A 06/17/2014    Procedure: SAVORY  DILATION;  Surgeon: Carol Ada, MD;  Location: [facility];  Service: Endoscopy;  Laterality: N/A;  . Esophagogastroduodenoscopy N/A 08/10/2014    Procedure: ESOPHAGOGASTRODUODENOSCOPY (EGD);  Surgeon: Carol Ada, MD;  Location: [facility];  Service: Endoscopy;  Laterality: N/A;    Prior to Admission medications   Medication Sig Start Date End Date Taking? Authorizing Provider  ALPRAZolam Duanne Moron) 0.5 MG tablet Take 0.5 mg by mouth at bedtime.   Yes Historical Provider, MD  amLODipine (NORVASC) 5 MG tablet Take 5 mg by mouth daily. 09/04/12  Yes Historical Provider, MD  Calcium Citrate-Vitamin D (CITRACAL + D PO) Take 1 tablet by mouth daily.   Yes Historical Provider, MD  chlorhexidine (PERIDEX) 0.12 % solution Use as directed 15 mLs in the mouth or throat every evening.   Yes Historical Provider, MD  Cholecalciferol (VITAMIN D-3) 1000 units CAPS Take 1 capsule by mouth daily.   Yes Historical Provider, MD  empagliflozin (JARDIANCE) 10 MG TABS tablet Take 10 mg by mouth daily.   Yes Historical Provider, MD  ergocalciferol (VITAMIN D2) 50000 UNITS capsule Take 50,000 Units by mouth once a week.   Yes Historical Provider, MD  fluvoxaMINE (LUVOX) 100 MG tablet Take 100 mg by mouth at bedtime.   Yes Historical Provider, MD  furosemide (LASIX) 20 MG tablet Take 20 mg by mouth daily before breakfast.  12/07/11  Yes Historical Provider, MD  insulin aspart (NOVOLOG) 100 UNIT/ML injection Inject 2-10 Units into the skin 3 (three) times daily with meals. 150-200  3 units  201-250 4 units 251-300 6 units 301-350 8 units  351-400 10 units 400> call MD   Yes Historical Provider, MD  insulin detemir (LEVEMIR) 100 UNIT/ML injection Inject 25 Units into the skin at bedtime.   Yes Historical Provider, MD  meloxicam (MOBIC) 15 MG tablet Take 15 mg by mouth daily.   Yes Historical Provider, MD  metFORMIN (GLUCOPHAGE) 500 MG tablet Take 500 mg by mouth 2 (two) times daily with a meal.   Yes Historical  Provider, MD  Multiple Vitamins-Minerals (DECUBI-VITE PO) Take 1 tablet by mouth daily.   Yes Historical Provider, MD  OLANZapine (ZYPREXA) 20 MG tablet Take 20 mg by mouth at bedtime.   Yes Historical Provider, MD  omega-3 acid ethyl esters (LOVAZA) 1 G capsule Take 1 g by mouth daily.   Yes Historical Provider, MD  omeprazole (PRILOSEC) 40 MG capsule Take 40 mg by mouth 2 (two) times daily.  09/28/12  Yes Historical Provider, MD  ONGLYZA 5 MG TABS tablet Take 5 mg by mouth daily before breakfast.  12/06/11  Yes Historical Provider, MD  pravastatin (PRAVACHOL) 40 MG tablet Take 40 mg by mouth daily.   Yes Historical Provider, MD  traMADol (ULTRAM) 50 MG tablet Take 50 mg by mouth every 6 (six) hours as needed for moderate pain.   Yes Historical Provider, MD    Current Facility-Administered Medications  Medication Dose Route Frequency Provider Last Rate Last Dose  . 0.9 %  sodium chloride infusion   Intravenous Continuous Virgel Manifold, MD 75 mL/hr at 05/28/15 2055    . 0.9 %  sodium chloride infusion   Intravenous Continuous Gatha Mayer, MD       Current Outpatient Prescriptions  Medication Sig Dispense Refill  . ALPRAZolam (XANAX) 0.5 MG tablet Take 0.5 mg by mouth at bedtime.    Marland Kitchen amLODipine (NORVASC) 5 MG tablet Take 5 mg by mouth daily.    . Calcium Citrate-Vitamin D (CITRACAL + D PO) Take 1 tablet by mouth daily.    . chlorhexidine (PERIDEX) 0.12 % solution Use as directed 15 mLs in the mouth or throat every evening.    . Cholecalciferol (VITAMIN D-3) 1000 units CAPS Take 1 capsule by mouth daily.    . empagliflozin (JARDIANCE) 10 MG TABS tablet Take 10 mg by mouth daily.    . ergocalciferol (VITAMIN D2) 50000 UNITS capsule Take 50,000 Units by mouth once a week.    . fluvoxaMINE (LUVOX) 100 MG tablet Take 100 mg by mouth at bedtime.    . furosemide (LASIX) 20 MG tablet Take 20 mg by mouth daily before breakfast.     . insulin aspart (NOVOLOG) 100 UNIT/ML injection Inject 2-10 Units  into the skin 3 (three) times daily with meals. 150-200 3 units  201-250 4 units 251-300 6 units 301-350 8 units  351-400 10 units 400> call MD    . insulin detemir (LEVEMIR) 100 UNIT/ML injection Inject 25 Units into the skin at bedtime.    . meloxicam (MOBIC) 15 MG tablet Take 15 mg by mouth daily.    . metFORMIN (GLUCOPHAGE) 500 MG tablet Take 500 mg by mouth 2 (two) times daily with a meal.    . Multiple Vitamins-Minerals (DECUBI-VITE PO) Take 1 tablet by mouth daily.    Marland Kitchen OLANZapine (ZYPREXA) 20 MG tablet Take 20 mg by mouth at bedtime.    Marland Kitchen omega-3 acid ethyl esters (LOVAZA) 1 G capsule Take 1 g by mouth daily.    Marland Kitchen  omeprazole (PRILOSEC) 40 MG capsule Take 40 mg by mouth 2 (two) times daily.     . ONGLYZA 5 MG TABS tablet Take 5 mg by mouth daily before breakfast.     . pravastatin (PRAVACHOL) 40 MG tablet Take 40 mg by mouth daily.    . traMADol (ULTRAM) 50 MG tablet Take 50 mg by mouth every 6 (six) hours as needed for moderate pain.      Allergies as of 05/28/2015 - Review Complete 05/28/2015  Allergen Reaction Noted  . Asa [aspirin]  02/21/2012  . Codeine Other (See Comments) 01/03/2012  . Tylenol [acetaminophen] Other (See Comments) 01/07/2012    Family History  Problem Relation Age of Onset  . Brain cancer Father     Social History   Social History  . Marital Status: Single    Spouse Name: N/A  . Number of Children: N/A  . Years of Education: college   Occupational History  .      Disabled   Social History Main Topics  . Smoking status: Never Smoker   . Smokeless tobacco: Never Used  . Alcohol Use: No  . Drug Use: No  . Sexual Activity: Not on file   Other Topics Concern  . Not on file   Social History Narrative   Patient is disabled and he has not worked since 1962. Patient has some college education.Patient drinks 2 cups of coffee daily.   Right handed.    Review of Systems: Positive for short-term memory loss All other review of systems  negative except as mentioned in the HPI.  Physical Exam: Vital signs in last 24 hours: Temp:  [98.1 F (36.7 C)] 98.1 F (36.7 C) (05/07 1800) Pulse Rate:  [102-113] 102 (05/07 2000) Resp:  [18-20] 20 (05/07 2000) BP: (137-154)/(81-90) 149/81 mmHg (05/07 2000) SpO2:  [95 %-97 %] 96 % (05/07 2000)   General:   Alert,  Well-developed, well-nourished, pleasant and cooperative in NAD Lungs:  Clear throughout to auscultation.   Heart:  Regular rate and rhythm; no murmurs, clicks, rubs,  or gallops. Abdomen:  Soft, nontender and nondistended. Normal bowel sounds.   Neuro/Psych:  Alert and cooperative. Normal mood and affect. A and O x 3   @Carl  Simonne Maffucci, MD, [facility] [phone] (pager) 05/28/2015 9:59 PM@

## 2015-05-28 NOTE — ED Notes (Signed)
Bed: WA08 Expected date:  Expected time:  Means of arrival:  Comments: Ems 69s m food stuck

## 2015-05-28 NOTE — ED Notes (Signed)
Bed: WA08 Expected date:  Expected time:  Means of arrival:  Comments: Patient in room

## 2015-05-28 NOTE — Progress Notes (Signed)
Report given to [place], Therapist, sports. Patient resting in ED stretcher at time of procedure care complete. Denies any pain at this time.

## 2015-05-28 NOTE — ED Notes (Signed)
Received report from endoscopy.

## 2015-05-29 DIAGNOSIS — T17320A Food in larynx causing asphyxiation, initial encounter: Secondary | ICD-10-CM | POA: Diagnosis not present

## 2015-05-30 ENCOUNTER — Encounter (HOSPITAL_COMMUNITY): Payer: Self-pay | Admitting: Internal Medicine

## 2015-06-02 DIAGNOSIS — E785 Hyperlipidemia, unspecified: Secondary | ICD-10-CM | POA: Diagnosis not present

## 2015-06-07 ENCOUNTER — Other Ambulatory Visit: Payer: Self-pay | Admitting: Gastroenterology

## 2015-06-07 ENCOUNTER — Encounter: Payer: Self-pay | Admitting: Podiatry

## 2015-06-07 ENCOUNTER — Ambulatory Visit (INDEPENDENT_AMBULATORY_CARE_PROVIDER_SITE_OTHER): Payer: Medicare Other | Admitting: Podiatry

## 2015-06-07 DIAGNOSIS — R1310 Dysphagia, unspecified: Secondary | ICD-10-CM | POA: Diagnosis not present

## 2015-06-07 DIAGNOSIS — K222 Esophageal obstruction: Secondary | ICD-10-CM | POA: Diagnosis not present

## 2015-06-07 DIAGNOSIS — K2270 Barrett's esophagus without dysplasia: Secondary | ICD-10-CM | POA: Diagnosis not present

## 2015-06-07 DIAGNOSIS — B351 Tinea unguium: Secondary | ICD-10-CM

## 2015-06-07 DIAGNOSIS — K449 Diaphragmatic hernia without obstruction or gangrene: Secondary | ICD-10-CM | POA: Diagnosis not present

## 2015-06-07 DIAGNOSIS — E119 Type 2 diabetes mellitus without complications: Secondary | ICD-10-CM

## 2015-06-07 DIAGNOSIS — M79609 Pain in unspecified limb: Principal | ICD-10-CM

## 2015-06-07 NOTE — Progress Notes (Signed)
Subjective:     Patient ID: Ricky [name]., male   DOB: Feb 16, 1940, 75 y.o.   MRN: [id number]  HPIThis patient presents to the office for preventive foot care services.  His nails have grown long thick and ingrown on second and third toes both feet.  He is diabetic with no complications. Healing skin lesion right big toe.   Review of Systems     Objective:   Physical Exam GENERAL APPEARANCE: Alert, conversant. Appropriately groomed. No acute distress.  VASCULAR: Pedal pulses palpable at  [facility] and PT bilateral.  Capillary refill time is immediate to all digits,  Normal temperature gradient.  Digital hair growth is present bilateral  NEUROLOGIC: sensation is normal to 5.07 monofilament at 5/5 sites bilateral.  Light touch is intact bilateral, Muscle strength normal.  MUSCULOSKELETAL: acceptable muscle strength, tone and stability bilateral.  Intrinsic muscluature intact bilateral.  Rectus appearance of foot and digits noted bilateral. Hallux malleus B/L  DERMATOLOGIC: skin color, texture, and turgor are within normal limits.  No preulcerative lesions or ulcers  are seen, no interdigital maceration noted.  No open lesions present.  . No drainage noted. Healing skin lesion right hallux. NAILS  Thick disfigured discolored nails both feet with no drainage or infection.      Assessment:     Onychomycosis     Plan:     Debridement of Nails both feet.  RTC 10 weeks.  Told to wear closed in shoes only when out.   Gardiner Barefoot DPM

## 2015-06-08 ENCOUNTER — Encounter (HOSPITAL_COMMUNITY): Payer: Self-pay | Admitting: *Deleted

## 2015-06-08 NOTE — Progress Notes (Signed)
Preop instructions for:  Kenzie Pastran                       Date of Birth    1940-02-25                        Date of Procedure:  06-09-15     Doctor: Benny Lennert Time to arrive at [facility]: 1215 PM Report to: Admitting Procedure time:  ([id number]- [id number])        Procedure:Esophagogastroduodenoscopy/ Balloon Dilation Any procedure time changes, MD office will notify you!   Do not eat or drink past midnight the night before your procedure.(To include any tube feedings-must be discontinued): exception May have clear Liquids( 12 midnight to 0700 AM 06-09-15, then nothing. Reminder:Follow instructions per MD office!   Take these morning medications only with sips of water.(or give through gastrostomy or feeding tube).: Amlodipine. Zyprexa. Insulin(1/2 usual PM dose) night before- no insulin or Diabetic Meds AM of.  Note: No Insulin or Diabetic meds should be given or taken the morning of the procedure!   Facility contact:  Ritta Slot                Phone:  [place] POA:(Please notify to be present if different from patient, and pt unable to consent for procedure)or phone number for contact.  Transportation contact phone#: (please provide name and phone number AM of on arrival)  Please send day of procedure:current med list and meds/date /time last taken that day, confirm nothing by mouth status from what time, Patient Demographic info( to include DNR status, problem list, allergies)   RN contact name/phone#:   "Freda Munro / "Dawn"     [phone]                     and Fax #: [phone]   Bring Insurance card and picture ID Leave all jewelry and other valuables at place where living( no metal or rings to be worn) No contact lens Women-no make-up, no lotions,perfumes,powders Men-no colognes,lotions  Any questions day of procedure,call Endoscopy unit-[phone]. Please notify [phone], okay to leave voice mail these instructions were  received, thanks.  Sent from :[facility]                   [place]                   Fax:[phone]  Sent by : Janel Beane,RN:_ Presurgical testing [facility]__

## 2015-06-08 NOTE — H&P (Signed)
  [place] HPI: On 05/28/2015 the patient was emergently endoscoped by Dr. Carlean Purl for a food impaction. The food was pushed into the gastric lumen and a stenosis was identified at 23 cm. This is the prior stenosis. In 07/2014 he acutely underwent an EGD for a large meat impaction when he did not chew the piece of meat.  Past Medical History  Diagnosis Date  . Diabetes mellitus without complication ([place])   . Hypertension   . Dementia   . Depression   . Arthritis   . Hyperlipidemia   . Cataract   . Urinary frequency   . Fatty liver   . Schizophrenia ([place])   . Peripheral edema   . Internal hemorrhoids   . Adenomatous colon polyp   . Type II or unspecified type diabetes mellitus without mention of complication, not stated as uncontrolled   . Abnormality of gait   . Other malaise and fatigue     Past Surgical History  Procedure Laterality Date  . Tonsillectomy    . Esophagogastroduodenoscopy  02/21/2012    Procedure: ESOPHAGOGASTRODUODENOSCOPY (EGD);  Surgeon: Beryle Beams, MD;  Location: [facility];  Service: Endoscopy;  Laterality: N/A;  . Esophagogastroduodenoscopy N/A 05/22/2012    Procedure: ESOPHAGOGASTRODUODENOSCOPY (EGD);  Surgeon: Beryle Beams, MD;  Location: [facility];  Service: Endoscopy;  Laterality: N/A;  . Balloon dilation N/A 05/22/2012    Procedure: BALLOON DILATION;  Surgeon: Beryle Beams, MD;  Location: [facility];  Service: Endoscopy;  Laterality: N/A;  . Esophagogastroduodenoscopy N/A 06/17/2014    Procedure: ESOPHAGOGASTRODUODENOSCOPY (EGD);  Surgeon: Carol Ada, MD;  Location: [facility];  Service: Endoscopy;  Laterality: N/A;  . Savory dilation N/A 06/17/2014    Procedure: SAVORY DILATION;  Surgeon: Carol Ada, MD;  Location: [facility];  Service: Endoscopy;  Laterality: N/A;  . Esophagogastroduodenoscopy N/A 08/10/2014    Procedure: ESOPHAGOGASTRODUODENOSCOPY (EGD);  Surgeon: Carol Ada, MD;  Location: [facility];  Service: Endoscopy;   Laterality: N/A;  . Esophagogastroduodenoscopy N/A 05/28/2015    Procedure: ESOPHAGOGASTRODUODENOSCOPY (EGD);  Surgeon: Gatha Mayer, MD;  Location: [facility];  Service: Endoscopy;  Laterality: N/A;    Family History  Problem Relation Age of Onset  . Brain cancer Father     Social History:  reports that he has never smoked. He has never used smokeless tobacco. He reports that he does not drink alcohol or use illicit drugs.  Allergies:  Allergies  Allergen Reactions  . Asa [Aspirin]     "Dr told me not to take it"  . Codeine Other (See Comments)    DIZZINESS with Tylenol 3  . Tylenol [Acetaminophen] Other (See Comments)    Dizziness with tylenol 3    Medications: Scheduled: Continuous:  No results found for this or any previous visit (from the past 24 hour(s)).   No results found.  ROS:  As stated above in the HPI otherwise negative.  There were no vitals taken for this visit.    PE: Gen: NAD, Alert and Oriented HEENT:  [place]/AT, EOMI Neck: Supple, no LAD Lungs: CTA Bilaterally CV: RRR without M/G/R ABM: Soft, NTND, +BS Ext: No C/C/E  Assessment/Plan: 1) Esophageal stricture with recent food impaction.  EGD with dilation. 2) Dysphagia.  Symphony Demuro D 06/08/2015, 11:44 AM

## 2015-06-09 ENCOUNTER — Ambulatory Visit (HOSPITAL_COMMUNITY): Payer: Medicare Other | Admitting: Anesthesiology

## 2015-06-09 ENCOUNTER — Encounter (HOSPITAL_COMMUNITY): Payer: Self-pay

## 2015-06-09 ENCOUNTER — Encounter (HOSPITAL_COMMUNITY)
Admission: RE | Disposition: A | Discharge status: Skilled Nursing Facility | Payer: Self-pay | Source: Ambulatory Visit | Attending: Gastroenterology

## 2015-06-09 ENCOUNTER — Ambulatory Visit (HOSPITAL_COMMUNITY)
Admission: RE | Admit: 2015-06-09 | Discharge: 2015-06-09 | Disposition: A | Discharge status: Skilled Nursing Facility | Payer: Medicare Other | Source: Ambulatory Visit | Attending: Gastroenterology | Admitting: Gastroenterology

## 2015-06-09 DIAGNOSIS — K222 Esophageal obstruction: Secondary | ICD-10-CM | POA: Insufficient documentation

## 2015-06-09 DIAGNOSIS — E119 Type 2 diabetes mellitus without complications: Secondary | ICD-10-CM | POA: Diagnosis not present

## 2015-06-09 DIAGNOSIS — M1990 Unspecified osteoarthritis, unspecified site: Secondary | ICD-10-CM | POA: Diagnosis not present

## 2015-06-09 DIAGNOSIS — I10 Essential (primary) hypertension: Secondary | ICD-10-CM | POA: Diagnosis not present

## 2015-06-09 DIAGNOSIS — Z6832 Body mass index (BMI) 32.0-32.9, adult: Secondary | ICD-10-CM | POA: Insufficient documentation

## 2015-06-09 DIAGNOSIS — K449 Diaphragmatic hernia without obstruction or gangrene: Secondary | ICD-10-CM | POA: Diagnosis not present

## 2015-06-09 DIAGNOSIS — K2270 Barrett's esophagus without dysplasia: Secondary | ICD-10-CM | POA: Diagnosis not present

## 2015-06-09 DIAGNOSIS — F0390 Unspecified dementia without behavioral disturbance: Secondary | ICD-10-CM | POA: Insufficient documentation

## 2015-06-09 DIAGNOSIS — R1310 Dysphagia, unspecified: Secondary | ICD-10-CM | POA: Diagnosis not present

## 2015-06-09 HISTORY — PX: BALLOON DILATION: SHX5330

## 2015-06-09 HISTORY — PX: ESOPHAGOGASTRODUODENOSCOPY (EGD) WITH PROPOFOL: SHX5813

## 2015-06-09 SURGERY — ESOPHAGOGASTRODUODENOSCOPY (EGD) WITH PROPOFOL
Anesthesia: Monitor Anesthesia Care

## 2015-06-09 MED ORDER — PROPOFOL 10 MG/ML IV BOLUS
INTRAVENOUS | Status: DC | PRN
  Administered 2015-06-09 (×6): 20 mg via INTRAVENOUS

## 2015-06-09 MED ORDER — PROPOFOL 10 MG/ML IV BOLUS
INTRAVENOUS | Status: AC
  Filled 2015-06-09: qty 40

## 2015-06-09 MED ORDER — SODIUM CHLORIDE 0.9 % IV SOLN: INTRAVENOUS | Status: DC

## 2015-06-09 MED ORDER — LACTATED RINGERS IV SOLN
INTRAVENOUS | Status: DC
  Administered 2015-06-09: 1000 mL via INTRAVENOUS
  Administered 2015-06-09: 14:00:00 via INTRAVENOUS

## 2015-06-09 SURGICAL SUPPLY — 14 items

## 2015-06-09 NOTE — Transfer of Care (Signed)
Immediate Anesthesia Transfer of Care Note  Patient: Ricky [name].  Procedure(s) Performed: Procedure(s): ESOPHAGOGASTRODUODENOSCOPY (EGD) WITH PROPOFOL (N/A) BALLOON DILATION (N/A)  Patient Location: PACU and Endoscopy Unit  Anesthesia Type:MAC  Level of Consciousness: awake  Airway & Oxygen Therapy: Patient Spontanous Breathing and Patient connected to nasal cannula oxygen  Post-op Assessment: Report given to RN and Post -op Vital signs reviewed and stable  Post vital signs: Reviewed and stable  Last Vitals:  Filed Vitals:   06/09/15 1311  BP: 135/78  Pulse: 70  Temp: 36.6 C  Resp: 18    Last Pain: There were no vitals filed for this visit.       Complications: No apparent anesthesia complications

## 2015-06-09 NOTE — Anesthesia Preprocedure Evaluation (Addendum)
Anesthesia Evaluation  Patient identified by MRN, date of birth, ID band Patient awake    Reviewed: Allergy & Precautions, NPO status , Patient's Chart, lab work & pertinent test results  Airway Mallampati: II  TM Distance: >3 FB Neck ROM: Full    Dental  (+) Teeth Intact   Pulmonary neg pulmonary ROS,    breath sounds clear to auscultation       Cardiovascular hypertension, + dysrhythmias  Rhythm:Regular Rate:Normal     Neuro/Psych Depression Bipolar Disorder  Neuromuscular disease    GI/Hepatic hiatal hernia, GERD  ,  Endo/Other  diabetes, Poorly ControlledMorbid obesity  Renal/GU      Musculoskeletal  (+) Arthritis ,   Abdominal (+) + obese,   Peds  Hematology   Anesthesia Other Findings   Reproductive/Obstetrics                            Anesthesia Physical Anesthesia Plan  ASA: III  Anesthesia Plan:    Post-op Pain Management:    Induction: Intravenous  Airway Management Planned: Nasal Cannula and Natural Airway  Additional Equipment:   Intra-op Plan:   Post-operative Plan:   Informed Consent: I have reviewed the patients History and Physical, chart, labs and discussed the procedure including the risks, benefits and alternatives for the proposed anesthesia with the patient or authorized representative who has indicated his/her understanding and acceptance.     Plan Discussed with:   Anesthesia Plan Comments:         Anesthesia Quick Evaluation

## 2015-06-09 NOTE — Discharge Instructions (Signed)

## 2015-06-09 NOTE — Op Note (Signed)
[facility] Patient Name: Ricky [name] Procedure Date: 06/09/2015 MRN: [id number] Attending MD: Carol Ada , MD Date of Birth: 09/25/1940 CSN: [id number] Age: 75 Admit Type: Outpatient Procedure:                Upper GI endoscopy Indications:              Dysphagia Providers:                Carol Ada, MD, Hilma Favors, RN, Zenon Mayo,                            RN Referring MD:              Medicines:                Propofol per Anesthesia Complications:            No immediate complications. Estimated Blood Loss:     Estimated blood loss: none. Procedure:                Pre-Anesthesia Assessment:                           - Prior to the procedure, a History and Physical                            was performed, and patient medications and                            allergies were reviewed. The patient's tolerance of                            previous anesthesia was also reviewed. The risks                            and benefits of the procedure and the sedation                            options and risks were discussed with the patient.                            All questions were answered, and informed consent                            was obtained. Prior Anticoagulants: The patient has                            taken no previous anticoagulant or antiplatelet                            agents. ASA Grade Assessment: III - A patient with                            severe systemic disease. After reviewing the risks                            and benefits,  the patient was deemed in                            satisfactory condition to undergo the procedure.                           - Sedation was administered by an anesthesia                            professional. Deep sedation was attained.                           After obtaining informed consent, the endoscope was                            passed under direct vision. Throughout the              procedure, the patient's blood pressure, pulse, and                            oxygen saturations were monitored continuously. The                            [id number]) scope was introduced through the                            mouth, and advanced to the second part of duodenum.                            The upper GI endoscopy was accomplished without                            difficulty. The patient tolerated the procedure                            well. Scope In: Scope Out: Findings:      Two mild benign-appearing, intrinsic stenoses were found 23 to 24 cm       from the incisors. The narrowest stenosis measured less than one cm (in       length) and they were traversed. A TTS dilator was passed through the       scope. Dilation with a 15-16.5-18 mm balloon dilator was performed to 18       mm. The dilation site was examined following endoscope reinsertion and       showed no change. Estimated blood loss: none.      There were esophageal mucosal changes secondary to established       long-segment Barrett's disease present in the proximal esophagus, in the       mid esophagus and in the distal esophagus. The maximum longitudinal       extent of these mucosal changes was 17 cm in length.      A 5 cm hiatal hernia was present.      The stomach was normal.      The examined duodenum was normal.      At 18 mm the balloon was able to pass through freely through  the Z-line       at 23 cm. No evidence of mucosal disruption. Impression:               - Benign-appearing esophageal stenoses. Dilated.                           - Esophageal mucosal changes secondary to                            established long-segment Barrett's disease.                           - 5 cm hiatal hernia.                           - Normal stomach.                           - Normal examined duodenum.                           - No specimens collected. Moderate Sedation:      N/A- Per  Anesthesia Care Recommendation:           - Patient has a contact number available for                            emergencies. The signs and symptoms of potential                            delayed complications were discussed with the                            patient. Return to normal activities tomorrow.                            Written discharge instructions were provided to the                            patient.                           - Resume previous diet.                           - Continue present medications - PPI, especially.                           - CHEW FOOD WELL. Procedure Code(s):        --- Professional ---                           [phone], Esophagogastroduodenoscopy, flexible,                            transoral; with transendoscopic balloon dilation of  esophagus (less than 30 mm diameter) Diagnosis Code(s):        --- Professional ---                           K22.2, Esophageal obstruction                           K22.70, Barrett's esophagus without dysplasia                           K44.9, Diaphragmatic hernia without obstruction or                            gangrene                           R13.10, Dysphagia, unspecified CPT copyright 2016 [employer]. All rights reserved. The codes documented in this report are preliminary and upon coder review may  be revised to meet current compliance requirements. Carol Ada, MD Carol Ada, MD 06/09/2015 2:13:22 PM This report has been signed electronically. Number of Addenda: 0

## 2015-06-12 ENCOUNTER — Encounter (HOSPITAL_COMMUNITY): Payer: Self-pay | Admitting: Gastroenterology

## 2015-06-12 LAB — POCT I-STAT 4, (NA,K, GLUC, HGB,HCT)
GLUCOSE: 130 mg/dL — AB (ref 65–99)
HEMATOCRIT: 39 % (ref 39.0–52.0)
HEMOGLOBIN: 13.3 g/dL (ref 13.0–17.0)
Potassium: 4.1 mmol/L (ref 3.5–5.1)
Sodium: 143 mmol/L (ref 135–145)

## 2015-06-14 NOTE — Anesthesia Postprocedure Evaluation (Signed)
Anesthesia Post Note  Patient: Ricky [name].  Procedure(s) Performed: Procedure(s) (LRB): ESOPHAGOGASTRODUODENOSCOPY (EGD) WITH PROPOFOL (N/A) BALLOON DILATION (N/A)  Patient location during evaluation: PACU Anesthesia Type: MAC Level of consciousness: awake and alert Pain management: pain level controlled Vital Signs Assessment: post-procedure vital signs reviewed and stable Respiratory status: spontaneous breathing, nonlabored ventilation, respiratory function stable and patient connected to nasal cannula oxygen Cardiovascular status: stable and blood pressure returned to baseline Anesthetic complications: no    Last Vitals:  Filed Vitals:   06/09/15 1415 06/09/15 1420  BP: 167/112 99/45  Pulse: 63 64  Temp:  36.7 C  Resp: 12 19    Last Pain: There were no vitals filed for this visit.               Ayaat Jansma,JAMES TERRILL

## 2015-06-16 NOTE — Anesthesia Postprocedure Evaluation (Signed)
Anesthesia Post Note  Patient: Mumin Sappenfield.  Procedure(s) Performed: Procedure(s) (LRB): ESOPHAGOGASTRODUODENOSCOPY (EGD) WITH PROPOFOL (N/A) BALLOON DILATION (N/A)  Patient location during evaluation: PACU Anesthesia Type: MAC Level of consciousness: awake and alert Pain management: pain level controlled Vital Signs Assessment: post-procedure vital signs reviewed and stable Respiratory status: spontaneous breathing, nonlabored ventilation, respiratory function stable and patient connected to nasal cannula oxygen Cardiovascular status: stable and blood pressure returned to baseline Anesthetic complications: no    Last Vitals:  Filed Vitals:   06/09/15 1415 06/09/15 1420  BP: 167/112 99/45  Pulse: 63 64  Temp:  36.7 C  Resp: 12 19    Last Pain: There were no vitals filed for this visit.               Granger Chui,JAMES TERRILL

## 2015-06-16 NOTE — Addendum Note (Signed)
Addendum  created 06/16/15 0741 by Rica Koyanagi, MD   Modules edited: Clinical Notes   Clinical Notes:  File: [id number]

## 2015-06-21 DIAGNOSIS — E559 Vitamin D deficiency, unspecified: Secondary | ICD-10-CM | POA: Diagnosis not present

## 2015-06-21 DIAGNOSIS — E039 Hypothyroidism, unspecified: Secondary | ICD-10-CM | POA: Diagnosis not present

## 2015-06-21 DIAGNOSIS — D649 Anemia, unspecified: Secondary | ICD-10-CM | POA: Diagnosis not present

## 2015-06-21 DIAGNOSIS — E119 Type 2 diabetes mellitus without complications: Secondary | ICD-10-CM | POA: Diagnosis not present

## 2015-06-27 DIAGNOSIS — E039 Hypothyroidism, unspecified: Secondary | ICD-10-CM | POA: Diagnosis not present

## 2015-08-16 ENCOUNTER — Encounter: Payer: Self-pay | Admitting: Podiatry

## 2015-08-16 ENCOUNTER — Ambulatory Visit (INDEPENDENT_AMBULATORY_CARE_PROVIDER_SITE_OTHER): Payer: Medicare Other | Admitting: Podiatry

## 2015-08-16 DIAGNOSIS — E119 Type 2 diabetes mellitus without complications: Secondary | ICD-10-CM

## 2015-08-16 DIAGNOSIS — B351 Tinea unguium: Secondary | ICD-10-CM

## 2015-08-16 DIAGNOSIS — M79609 Pain in unspecified limb: Principal | ICD-10-CM

## 2015-08-16 DIAGNOSIS — M79676 Pain in unspecified toe(s): Secondary | ICD-10-CM

## 2015-08-16 NOTE — Progress Notes (Signed)
Subjective:     Patient ID: Ricky [name]., male   DOB: March 11, 1940, 75 y.o.   MRN: [id number]  HPIThis patient presents to the office for preventive foot care services.  His nails have grown long thick and ingrown on second and third toes both feet.  He is diabetic with no complications. Healing skin lesion right big toe.   Review of Systems     Objective:   Physical Exam GENERAL APPEARANCE: Alert, conversant. Appropriately groomed. No acute distress.  VASCULAR: Pedal pulses palpable at  [facility] and PT bilateral.  Capillary refill time is immediate to all digits,  Normal temperature gradient.  Digital hair growth is present bilateral  NEUROLOGIC: sensation is normal to 5.07 monofilament at 5/5 sites bilateral.  Light touch is intact bilateral, Muscle strength normal.  MUSCULOSKELETAL: acceptable muscle strength, tone and stability bilateral.  Intrinsic muscluature intact bilateral.  Rectus appearance of foot and digits noted bilateral. Hallux malleus B/L  DERMATOLOGIC: skin color, texture, and turgor are within normal limits.  No preulcerative lesions or ulcers  are seen, no interdigital maceration noted.  No open lesions present.  . No drainage noted. Healing skin lesion right hallux. NAILS  Thick disfigured discolored nails both feet with no drainage or infection.      Assessment:     Onychomycosis     Plan:     Debridement of Nails both feet.  RTC 10 weeks.  Told to wear closed in shoes only when out.   Gardiner Barefoot DPM

## 2015-09-26 DIAGNOSIS — M25511 Pain in right shoulder: Secondary | ICD-10-CM | POA: Diagnosis not present

## 2015-09-26 DIAGNOSIS — M25512 Pain in left shoulder: Secondary | ICD-10-CM | POA: Diagnosis not present

## 2015-10-06 DIAGNOSIS — Z23 Encounter for immunization: Secondary | ICD-10-CM | POA: Diagnosis not present

## 2015-10-25 ENCOUNTER — Encounter: Payer: Self-pay | Admitting: Podiatry

## 2015-10-25 ENCOUNTER — Ambulatory Visit (INDEPENDENT_AMBULATORY_CARE_PROVIDER_SITE_OTHER): Payer: Medicare Other | Admitting: Podiatry

## 2015-10-25 VITALS — Ht 69.5 in | Wt 218.0 lb

## 2015-10-25 DIAGNOSIS — M79609 Pain in unspecified limb: Principal | ICD-10-CM

## 2015-10-25 DIAGNOSIS — M79676 Pain in unspecified toe(s): Secondary | ICD-10-CM

## 2015-10-25 DIAGNOSIS — B351 Tinea unguium: Secondary | ICD-10-CM | POA: Diagnosis not present

## 2015-10-25 DIAGNOSIS — E119 Type 2 diabetes mellitus without complications: Secondary | ICD-10-CM | POA: Diagnosis not present

## 2015-10-25 NOTE — Progress Notes (Signed)
Subjective:     Patient ID: Ricky [name]., male   DOB: 12-22-1940, 75 y.o.   MRN: [id number]  HPIThis patient presents to the office for preventive foot care services.  His nails have grown long thick and ingrown on second and third toes both feet.  He is diabetic with no complications. Healing skin lesion right big toe.   Review of Systems     Objective:   Physical Exam GENERAL APPEARANCE: Alert, conversant. Appropriately groomed. No acute distress.  VASCULAR: Pedal pulses palpable at  [facility] and PT bilateral.  Capillary refill time is immediate to all digits,  Normal temperature gradient.  Digital hair growth is present bilateral  NEUROLOGIC: sensation is normal to 5.07 monofilament at 5/5 sites bilateral.  Light touch is intact bilateral, Muscle strength normal.  MUSCULOSKELETAL: acceptable muscle strength, tone and stability bilateral.  Intrinsic muscluature intact bilateral.  Rectus appearance of foot and digits noted bilateral. Hallux malleus B/L  DERMATOLOGIC: skin color, texture, and turgor are within normal limits.  No preulcerative lesions or ulcers  are seen, no interdigital maceration noted.  No open lesions present.  . No drainage noted. Healing skin lesion right hallux. NAILS  Thick disfigured discolored nails both feet with no drainage or infection.      Assessment:     Onychomycosis     Plan:     Debridement of Nails both feet.  RTC 10 weeks.     Gardiner Barefoot DPM

## 2015-11-01 DIAGNOSIS — H35033 Hypertensive retinopathy, bilateral: Secondary | ICD-10-CM | POA: Diagnosis not present

## 2015-11-01 DIAGNOSIS — I708 Atherosclerosis of other arteries: Secondary | ICD-10-CM | POA: Diagnosis not present

## 2015-11-01 DIAGNOSIS — E119 Type 2 diabetes mellitus without complications: Secondary | ICD-10-CM | POA: Diagnosis not present

## 2015-11-01 DIAGNOSIS — H40013 Open angle with borderline findings, low risk, bilateral: Secondary | ICD-10-CM | POA: Diagnosis not present

## 2015-11-07 DIAGNOSIS — M25511 Pain in right shoulder: Secondary | ICD-10-CM | POA: Diagnosis not present

## 2015-11-07 DIAGNOSIS — M25512 Pain in left shoulder: Secondary | ICD-10-CM | POA: Diagnosis not present

## 2015-11-30 DIAGNOSIS — E785 Hyperlipidemia, unspecified: Secondary | ICD-10-CM | POA: Diagnosis not present

## 2015-11-30 DIAGNOSIS — E039 Hypothyroidism, unspecified: Secondary | ICD-10-CM | POA: Diagnosis not present

## 2015-11-30 DIAGNOSIS — E559 Vitamin D deficiency, unspecified: Secondary | ICD-10-CM | POA: Diagnosis not present

## 2015-11-30 DIAGNOSIS — D649 Anemia, unspecified: Secondary | ICD-10-CM | POA: Diagnosis not present

## 2015-12-12 DIAGNOSIS — I10 Essential (primary) hypertension: Secondary | ICD-10-CM | POA: Diagnosis not present

## 2015-12-12 DIAGNOSIS — N390 Urinary tract infection, site not specified: Secondary | ICD-10-CM | POA: Diagnosis not present

## 2015-12-12 DIAGNOSIS — E118 Type 2 diabetes mellitus with unspecified complications: Secondary | ICD-10-CM | POA: Diagnosis not present

## 2015-12-13 ENCOUNTER — Ambulatory Visit (HOSPITAL_COMMUNITY)
Admission: RE | Admit: 2015-12-13 | Discharge: 2015-12-13 | Disposition: A | Discharge status: Home / Self Care | Payer: Medicare Other | Attending: Psychiatry | Admitting: Psychiatry

## 2015-12-13 DIAGNOSIS — F411 Generalized anxiety disorder: Secondary | ICD-10-CM | POA: Insufficient documentation

## 2015-12-13 DIAGNOSIS — F32A Depression, unspecified: Secondary | ICD-10-CM | POA: Diagnosis present

## 2015-12-13 DIAGNOSIS — F329 Major depressive disorder, single episode, unspecified: Secondary | ICD-10-CM | POA: Diagnosis present

## 2015-12-13 NOTE — BH Assessment (Signed)
Assessment Note  Ricky [name]. is a 75 y.o. male who presented voluntarily to [facility] as a walk in just to "get checked out". Pt shared that his psychiatrist told him not to come to the hospital but he feels like the Reita Cliche was telling him to come. Pt denied SI, HI, AVH. Pt has a hx of AH, but stated that they have gone away. Pt expressed feeling guilty sometimes due to his religious beliefs, but cited this to be a normal feeling. This it the 3rd time pt has come into [facility] as a walk-in w/in the past 12 months and his presentation is the exact same as the 2 other times. Pt is not interested in becoming IP, but appeared to want confirmation of this.    Diagnosis: GAD  Past Medical History:  Past Medical History:  Diagnosis Date  . Abnormality of gait   . Adenomatous colon polyp   . Arthritis   . Cataract   . Dementia   . Depression   . Diabetes mellitus without complication ([place])   . Fatty liver   . Hyperlipidemia   . Hypertension   . Internal hemorrhoids   . Other malaise and fatigue   . Peripheral edema   . Schizophrenia ([place])   . Type II or unspecified type diabetes mellitus without mention of complication, not stated as uncontrolled   . Urinary frequency     Past Surgical History:  Procedure Laterality Date  . BALLOON DILATION N/A 05/22/2012   Procedure: BALLOON DILATION;  Surgeon: Beryle Beams, MD;  Location: [facility] ENDOSCOPY;  Service: Endoscopy;  Laterality: N/A;  . BALLOON DILATION N/A 06/09/2015   Procedure: BALLOON DILATION;  Surgeon: Carol Ada, MD;  Location: [facility] ENDOSCOPY;  Service: Endoscopy;  Laterality: N/A;  . ESOPHAGOGASTRODUODENOSCOPY  02/21/2012   Procedure: ESOPHAGOGASTRODUODENOSCOPY (EGD);  Surgeon: Beryle Beams, MD;  [facility];  Service: Endoscopy;  Laterality: N/A;  . ESOPHAGOGASTRODUODENOSCOPY N/A 05/22/2012   Procedure: ESOPHAGOGASTRODUODENOSCOPY (EGD);  Surgeon: Beryle Beams, MD;  Location: [facility];  Service: Endoscopy;  Laterality: N/A;  .  ESOPHAGOGASTRODUODENOSCOPY N/A 06/17/2014   Procedure: ESOPHAGOGASTRODUODENOSCOPY (EGD);  Surgeon: Carol Ada, MD;  Location: [facility];  Service: Endoscopy;  Laterality: N/A;  . ESOPHAGOGASTRODUODENOSCOPY N/A 08/10/2014   Procedure: ESOPHAGOGASTRODUODENOSCOPY (EGD);  Surgeon: Carol Ada, MD;  Location: [facility];  Service: Endoscopy;  Laterality: N/A;  . ESOPHAGOGASTRODUODENOSCOPY N/A 05/28/2015   Procedure: ESOPHAGOGASTRODUODENOSCOPY (EGD);  Surgeon: Gatha Mayer, MD;  Location: [facility];  Service: Endoscopy;  Laterality: N/A;  . ESOPHAGOGASTRODUODENOSCOPY (EGD) WITH PROPOFOL N/A 06/09/2015   Procedure: ESOPHAGOGASTRODUODENOSCOPY (EGD) WITH PROPOFOL;  Surgeon: Carol Ada, MD;  Location: [facility];  Service: Endoscopy;  Laterality: N/A;  . SAVORY DILATION N/A 06/17/2014   Procedure: SAVORY DILATION;  Surgeon: Carol Ada, MD;  Location: [facility] ENDOS[facility];  Service: Endoscopy;  Laterality: N/A;  . TONSILLECTOMY      Family History:  Family History  Problem Relation Age of Onset  . Brain cancer Father     Social History:  reports that he has never smoked. He has never used smokeless tobacco. He reports that he does not drink alcohol or use drugs.  Additional Social History:  Alcohol / Drug Use Pain Medications: pt denies abuse - see meds list Prescriptions: pt denies abuse - see meds list Over the Counter: pt denies abuse - see meds list History of alcohol / drug use?: No history of alcohol / drug abuse  CIWA:   COWS:    Allergies:  Allergies  Allergen Reactions  . Asa [Aspirin]     "Dr told me not to take it"  . Codeine Other (See Comments)    DIZZINESS with Tylenol 3  . Tylenol [Acetaminophen] Other (See Comments)    Dizziness with tylenol 3    Home Medications:  (Not in a hospital admission)  OB/GYN Status:  No LMP for male patient.  General Assessment Data Location of Assessment: [facility] Assessment Services TTS Assessment: In system Is this a Tele or  Face-to-Face Assessment?: Face-to-Face Is this an Initial Assessment or a Re-assessment for this encounter?: Initial Assessment Marital status: Single Living Arrangements: Other (Comment) ([facility]) Can pt return to current living arrangement?: Yes Admission Status: Voluntary Is patient capable of signing voluntary admission?: Yes Referral Source: Self/Family/Friend Insurance type: AARP Medicare Complete  Medical Screening Exam ([place]) Medical Exam completed: Yes  Crisis Care Plan Living Arrangements: Other (Comment) ([facility]) Name of Psychiatrist: Dr. Clovis Pu ([facility]) Name of Therapist: [facility]  Education Status Is patient currently in school?: No  Risk to self with the past 6 months Suicidal Ideation: No Has patient been a risk to self within the past 6 months prior to admission? : No Suicidal Intent: No Has patient had any suicidal intent within the past 6 months prior to admission? : No Is patient at risk for suicide?: No Suicidal Plan?: No Has patient had any suicidal plan within the past 6 months prior to admission? : No Access to Means: No What has been your use of drugs/alcohol within the last 12 months?: no drug use Previous Attempts/Gestures: No Intentional Self Injurious Behavior: None Family Suicide History: No Persecutory voices/beliefs?: No Depression: No Substance abuse history and/or treatment for substance abuse?: No Suicide prevention information given to non-admitted patients: Not applicable  Risk to Others within the past 6 months Homicidal Ideation: No Does patient have any lifetime risk of violence toward others beyond the six months prior to admission? : No Thoughts of Harm to Others: No Current Homicidal Intent: No Current Homicidal Plan: No Access to Homicidal Means: No History of harm to others?: No Assessment of Violence: None Noted Does patient have access to weapons?: No Criminal Charges  Pending?: No Does patient have a court date: No Is patient on probation?: No  Psychosis Hallucinations: None noted Delusions: None noted  Mental Status Report Appearance/Hygiene: Unremarkable Eye Contact: Good Motor Activity: Unremarkable Speech: Logical/coherent, Unremarkable Level of Consciousness: Alert Mood: Euthymic, Pleasant Affect: Appropriate to circumstance Anxiety Level: None Thought Processes: Coherent, Relevant Judgement: Unimpaired Orientation: Person, Place, Time, Situation Obsessive Compulsive Thoughts/Behaviors: None  Cognitive Functioning Concentration: Normal Memory: Recent Intact, Remote Intact IQ: Average Insight: Good Impulse Control: Good Appetite: Good Sleep: No Change Vegetative Symptoms: None  ADLScreening [facility] Assessment Services) Patient's cognitive ability adequate to safely complete daily activities?: Yes Patient able to express need for assistance with ADLs?: Yes Independently performs ADLs?: Yes (appropriate for developmental age)  Prior Inpatient Therapy Prior Inpatient Therapy: No Prior Therapy Dates: 45 years ago Prior Therapy Facilty/Provider(s): [facility]  Prior Outpatient Therapy Prior Outpatient Therapy: No Does patient have an ACCT team?: No Does patient have Intensive In-House Services?  : No Does patient have [facility] services? : No Does patient have P4CC services?: No  ADL Screening (condition at time of admission) Patient's cognitive ability adequate to safely complete daily activities?: Yes Is the patient deaf or have difficulty hearing?: No Does the patient have difficulty seeing, even when wearing glasses/contacts?: No Does the patient have  difficulty concentrating, remembering, or making decisions?: No Patient able to express need for assistance with ADLs?: Yes Does the patient have difficulty dressing or bathing?: No Independently performs ADLs?: Yes (appropriate for developmental age) Does the patient have  difficulty walking or climbing stairs?: No Weakness of Legs: Both Weakness of Arms/Hands: None  Home Assistive Devices/Equipment Home Assistive Devices/Equipment: Wheelchair    Abuse/Neglect Assessment (Assessment to be complete while patient is alone) Physical Abuse: Denies Verbal Abuse: Denies Sexual Abuse: Denies Exploitation of patient/patient's resources: Denies Self-Neglect: Denies Values / Beliefs Cultural Requests During Hospitalization: None Spiritual Requests During Hospitalization: None Consults Spiritual Care Consult Needed: No Social Work Consult Needed: No Regulatory affairs officer (For Healthcare) Does Patient Have a Medical Advance Directive?: Yes Would patient like information on creating a medical advance directive?: No - Patient declined    Additional Information 1:1 In Past 12 Months?: No CIRT Risk: No Elopement Risk: No Does patient have medical clearance?: Yes     Disposition:  Disposition Initial Assessment Completed for this Encounter:  (consulted with Jinny Blossom, NP) Disposition of Patient: Other dispositions Other disposition(s): To current provider  On Site Evaluation by:   Reviewed with Physician:    Rexene Edison 12/13/2015 11:45 AM

## 2015-12-13 NOTE — H&P (Signed)
Behavioral Health Medical Screening Exam  Ricky [name]. is an 75 y.o. male.  Total Time spent with patient: 20 minutes  Psychiatric Specialty Exam: Physical Exam  Constitutional: He is oriented to person, place, and time. He appears well-developed and well-nourished.  HENT:  Head: Normocephalic.  Neck: Normal range of motion.  Cardiovascular: Normal rate and normal heart sounds.   Respiratory: Effort normal and breath sounds normal.  GI: Soft. Bowel sounds are normal.  Musculoskeletal: Normal range of motion.  Neurological: He is alert and oriented to person, place, and time.  Skin: Skin is warm and dry.    Review of Systems  Constitutional: Negative.   HENT: Negative.   Eyes: Negative.   Respiratory: Negative.   Cardiovascular: Negative.   Gastrointestinal: Negative.   Genitourinary: Positive for frequency.  Musculoskeletal: Negative.   Skin: Negative.   Neurological: Negative.   Endo/Heme/Allergies: Negative.   Psychiatric/Behavioral: Positive for depression. Negative for hallucinations, memory loss, substance abuse and suicidal ideas. The patient is nervous/anxious. The patient does not have insomnia.     BP 131/71   Pulse 93   Temp 98.4 F (36.9 C) (Oral)   Resp 18   SpO2 96%    General Appearance: Disheveled  Eye Contact:  Good  Speech:  Clear and Coherent and Normal Rate  Volume:  Normal  Mood:  Euthymic  Affect:  Appropriate and Congruent  Thought Process:  Coherent, Goal Directed and Linear  Orientation:  Full (Time, Place, and Person)  Thought Content:  Logical  Suicidal Thoughts:  No  Homicidal Thoughts:  No  Memory:  Immediate;   Good Recent;   Good Remote;   Fair  Judgement:  Fair  Insight:  Fair  Psychomotor Activity:  Normal  Concentration: Concentration: Good and Attention Span: Good  Recall:  [employer] of Knowledge:Fair  Language: Good  Akathisia:  No  Handed:  Right  AIMS (if indicated):     Assets:  Medical sales representative Housing Leisure Time Resilience Social Support Transportation  Sleep:       Musculoskeletal: Strength & Muscle Tone: within normal limits Gait & Station: normal Patient leans: N/A  BP 131/71   Pulse 93   Temp 98.4 F (36.9 C) (Oral)   Resp 18   SpO2 96%   Recommendations:  Based on my evaluation the patient does not appear to have an emergency medical condition.  Ethelene Hal, NP 12/13/2015, 11:49 AM

## 2015-12-19 DIAGNOSIS — E789 Disorder of lipoprotein metabolism, unspecified: Secondary | ICD-10-CM | POA: Diagnosis not present

## 2015-12-19 DIAGNOSIS — E118 Type 2 diabetes mellitus with unspecified complications: Secondary | ICD-10-CM | POA: Diagnosis not present

## 2015-12-19 DIAGNOSIS — M25519 Pain in unspecified shoulder: Secondary | ICD-10-CM | POA: Diagnosis not present

## 2016-01-04 ENCOUNTER — Encounter: Payer: Self-pay | Admitting: Podiatry

## 2016-01-04 ENCOUNTER — Ambulatory Visit (INDEPENDENT_AMBULATORY_CARE_PROVIDER_SITE_OTHER): Payer: Medicare Other | Admitting: Podiatry

## 2016-01-04 VITALS — Ht 69.0 in | Wt 218.0 lb

## 2016-01-04 DIAGNOSIS — B351 Tinea unguium: Secondary | ICD-10-CM

## 2016-01-04 DIAGNOSIS — M79609 Pain in unspecified limb: Secondary | ICD-10-CM

## 2016-01-04 DIAGNOSIS — E119 Type 2 diabetes mellitus without complications: Secondary | ICD-10-CM | POA: Diagnosis not present

## 2016-01-04 NOTE — Progress Notes (Signed)
Subjective:     Patient ID: Ricky S Fahrner Jr., male   DOB: 05/06/1940, 75 y.o.   MRN: [id number]  HPIThis patient presents to the office for preventive foot care services.  His nails have grown long thick and ingrown on second and third toes both feet.  He is diabetic with no complications. Healing skin lesion right big toe.   Review of Systems     Objective:   Physical Exam GENERAL APPEARANCE: Alert, conversant. Appropriately groomed. No acute distress.  VASCULAR: Pedal pulses palpable at  [facility] and PT bilateral.  Capillary refill time is immediate to all digits,  Normal temperature gradient.  Digital hair growth is present bilateral  NEUROLOGIC: sensation is normal to 5.07 monofilament at 5/5 sites bilateral.  Light touch is intact bilateral, Muscle strength normal.  MUSCULOSKELETAL: acceptable muscle strength, tone and stability bilateral.  Intrinsic muscluature intact bilateral.  Rectus appearance of foot and digits noted bilateral. Hallux malleus B/L  DERMATOLOGIC: skin color, texture, and turgor are within normal limits.  No preulcerative lesions or ulcers  are seen, no interdigital maceration noted.  No open lesions present.  . No drainage noted. Healing skin lesion right hallux. NAILS  Thick disfigured discolored nails both feet with no drainage or infection.      Assessment:     Onychomycosis     Plan:     Debridement of Nails both feet.  RTC 10 weeks.     Marylene Masek DPM      

## 2016-01-08 DIAGNOSIS — E1122 Type 2 diabetes mellitus with diabetic chronic kidney disease: Secondary | ICD-10-CM | POA: Diagnosis not present

## 2016-01-08 DIAGNOSIS — K222 Esophageal obstruction: Secondary | ICD-10-CM | POA: Diagnosis not present

## 2016-01-08 DIAGNOSIS — N183 Chronic kidney disease, stage 3 (moderate): Secondary | ICD-10-CM | POA: Diagnosis not present

## 2016-02-23 DIAGNOSIS — E119 Type 2 diabetes mellitus without complications: Secondary | ICD-10-CM | POA: Diagnosis not present

## 2016-02-23 DIAGNOSIS — E785 Hyperlipidemia, unspecified: Secondary | ICD-10-CM | POA: Diagnosis not present

## 2016-03-20 DIAGNOSIS — I10 Essential (primary) hypertension: Secondary | ICD-10-CM | POA: Diagnosis not present

## 2016-03-20 DIAGNOSIS — E119 Type 2 diabetes mellitus without complications: Secondary | ICD-10-CM | POA: Diagnosis not present

## 2016-03-20 DIAGNOSIS — N183 Chronic kidney disease, stage 3 (moderate): Secondary | ICD-10-CM | POA: Diagnosis not present

## 2016-03-20 DIAGNOSIS — E7800 Pure hypercholesterolemia, unspecified: Secondary | ICD-10-CM | POA: Diagnosis not present

## 2016-03-28 ENCOUNTER — Ambulatory Visit (INDEPENDENT_AMBULATORY_CARE_PROVIDER_SITE_OTHER): Payer: Medicare Other | Admitting: Podiatry

## 2016-03-28 ENCOUNTER — Encounter: Payer: Self-pay | Admitting: Podiatry

## 2016-03-28 DIAGNOSIS — M79609 Pain in unspecified limb: Secondary | ICD-10-CM | POA: Diagnosis not present

## 2016-03-28 DIAGNOSIS — B351 Tinea unguium: Secondary | ICD-10-CM

## 2016-03-28 DIAGNOSIS — E119 Type 2 diabetes mellitus without complications: Secondary | ICD-10-CM

## 2016-03-28 NOTE — Progress Notes (Signed)
Subjective:     Patient ID: Ricky [name]., male   DOB: 02-Aug-1940, 76 y.o.   MRN: [id number]  HPIThis patient presents to the office for preventive foot care services.  His nails have grown long thick and ingrown on second and third toes both feet.  He is diabetic with no complications. Healing skin lesion right big toe.   Review of Systems     Objective:   Physical Exam GENERAL APPEARANCE: Alert, conversant. Appropriately groomed. No acute distress.  VASCULAR: Pedal pulses palpable at  [facility] and PT bilateral.  Capillary refill time is immediate to all digits,  Normal temperature gradient.  Digital hair growth is present bilateral  NEUROLOGIC: sensation is normal to 5.07 monofilament at 5/5 sites bilateral.  Light touch is intact bilateral, Muscle strength normal.  MUSCULOSKELETAL: acceptable muscle strength, tone and stability bilateral.  Intrinsic muscluature intact bilateral.  Rectus appearance of foot and digits noted bilateral. Hallux malleus B/L  DERMATOLOGIC: skin color, texture, and turgor are within normal limits.  No preulcerative lesions or ulcers  are seen, no interdigital maceration noted.  No open lesions present.  . No drainage noted. Healing skin lesion right hallux. NAILS  Thick disfigured discolored nails both feet with no drainage or infection.      Assessment:     Onychomycosis     Plan:     Debridement of Nails both feet.  RTC 10 weeks.     Gardiner Barefoot DPM

## 2016-04-09 DIAGNOSIS — I10 Essential (primary) hypertension: Secondary | ICD-10-CM | POA: Diagnosis not present

## 2016-04-09 DIAGNOSIS — E118 Type 2 diabetes mellitus with unspecified complications: Secondary | ICD-10-CM | POA: Diagnosis not present

## 2016-04-16 DIAGNOSIS — I10 Essential (primary) hypertension: Secondary | ICD-10-CM | POA: Diagnosis not present

## 2016-04-16 DIAGNOSIS — E789 Disorder of lipoprotein metabolism, unspecified: Secondary | ICD-10-CM | POA: Diagnosis not present

## 2016-04-16 DIAGNOSIS — M549 Dorsalgia, unspecified: Secondary | ICD-10-CM | POA: Diagnosis not present

## 2016-04-16 DIAGNOSIS — E118 Type 2 diabetes mellitus with unspecified complications: Secondary | ICD-10-CM | POA: Diagnosis not present

## 2016-05-15 DIAGNOSIS — E119 Type 2 diabetes mellitus without complications: Secondary | ICD-10-CM | POA: Diagnosis not present

## 2016-05-15 DIAGNOSIS — E785 Hyperlipidemia, unspecified: Secondary | ICD-10-CM | POA: Diagnosis not present

## 2016-05-15 DIAGNOSIS — N189 Chronic kidney disease, unspecified: Secondary | ICD-10-CM | POA: Diagnosis not present

## 2016-06-05 ENCOUNTER — Encounter: Payer: Self-pay | Admitting: Podiatry

## 2016-06-05 ENCOUNTER — Ambulatory Visit (INDEPENDENT_AMBULATORY_CARE_PROVIDER_SITE_OTHER): Payer: Medicare Other | Admitting: Podiatry

## 2016-06-05 DIAGNOSIS — B351 Tinea unguium: Secondary | ICD-10-CM

## 2016-06-05 DIAGNOSIS — M79609 Pain in unspecified limb: Secondary | ICD-10-CM | POA: Diagnosis not present

## 2016-06-05 DIAGNOSIS — E119 Type 2 diabetes mellitus without complications: Secondary | ICD-10-CM

## 2016-06-05 NOTE — Progress Notes (Signed)
Subjective:     Patient ID: Ricky [name]., male   DOB: [birth year], 76 y.o.   MRN: [id number]  HPIThis patient presents to the office for preventive foot care services.  His nails have grown long thick and ingrown on second and third toes both feet.  He is diabetic with no complications. Healing skin lesion right big toe.   Review of Systems     Objective:   Physical Exam GENERAL APPEARANCE: Alert, conversant. Appropriately groomed. No acute distress.  VASCULAR: Pedal pulses palpable at  [facility] and PT bilateral.  Capillary refill time is immediate to all digits,  Normal temperature gradient.   NEUROLOGIC: sensation is normal to 5.07 monofilament at 5/5 sites bilateral.  Light touch is intact bilateral, Muscle strength normal.  MUSCULOSKELETAL: acceptable muscle strength, tone and stability bilateral.  Intrinsic muscluature intact bilateral.  Rectus appearance of foot and digits noted bilateral. Hallux malleus B/L  DERMATOLOGIC: skin color, texture, and turgor are within normal limits.  No preulcerative lesions or ulcers  are seen, no interdigital maceration noted.  No open lesions present.  . No drainage noted. Healing skin lesion right hallux. NAILS  Thick disfigured discolored nails both feet with no drainage or infection.      Assessment:     Onychomycosis     Plan:     Debridement of Nails both feet.  RTC 10 weeks.     Gardiner Barefoot DPM

## 2016-06-07 DIAGNOSIS — E118 Type 2 diabetes mellitus with unspecified complications: Secondary | ICD-10-CM | POA: Diagnosis not present

## 2016-06-07 DIAGNOSIS — I10 Essential (primary) hypertension: Secondary | ICD-10-CM | POA: Diagnosis not present

## 2016-06-13 DIAGNOSIS — N390 Urinary tract infection, site not specified: Secondary | ICD-10-CM | POA: Diagnosis not present

## 2016-06-13 DIAGNOSIS — E118 Type 2 diabetes mellitus with unspecified complications: Secondary | ICD-10-CM | POA: Diagnosis not present

## 2016-06-13 DIAGNOSIS — I10 Essential (primary) hypertension: Secondary | ICD-10-CM | POA: Diagnosis not present

## 2016-06-13 DIAGNOSIS — N400 Enlarged prostate without lower urinary tract symptoms: Secondary | ICD-10-CM | POA: Diagnosis not present

## 2016-07-10 DIAGNOSIS — I10 Essential (primary) hypertension: Secondary | ICD-10-CM | POA: Diagnosis not present

## 2016-07-10 DIAGNOSIS — N183 Chronic kidney disease, stage 3 (moderate): Secondary | ICD-10-CM | POA: Diagnosis not present

## 2016-07-19 DIAGNOSIS — H40013 Open angle with borderline findings, low risk, bilateral: Secondary | ICD-10-CM | POA: Diagnosis not present

## 2016-07-19 DIAGNOSIS — H43393 Other vitreous opacities, bilateral: Secondary | ICD-10-CM | POA: Diagnosis not present

## 2016-07-19 DIAGNOSIS — E119 Type 2 diabetes mellitus without complications: Secondary | ICD-10-CM | POA: Diagnosis not present

## 2016-07-19 DIAGNOSIS — H35033 Hypertensive retinopathy, bilateral: Secondary | ICD-10-CM | POA: Diagnosis not present

## 2016-07-24 DIAGNOSIS — E119 Type 2 diabetes mellitus without complications: Secondary | ICD-10-CM | POA: Diagnosis not present

## 2016-07-24 DIAGNOSIS — D649 Anemia, unspecified: Secondary | ICD-10-CM | POA: Diagnosis not present

## 2016-07-24 DIAGNOSIS — Z79899 Other long term (current) drug therapy: Secondary | ICD-10-CM | POA: Diagnosis not present

## 2016-07-24 DIAGNOSIS — I10 Essential (primary) hypertension: Secondary | ICD-10-CM | POA: Diagnosis not present

## 2016-07-24 DIAGNOSIS — E785 Hyperlipidemia, unspecified: Secondary | ICD-10-CM | POA: Diagnosis not present

## 2016-08-14 ENCOUNTER — Encounter: Payer: Self-pay | Admitting: Podiatry

## 2016-08-14 ENCOUNTER — Ambulatory Visit (INDEPENDENT_AMBULATORY_CARE_PROVIDER_SITE_OTHER): Payer: Medicare Other | Admitting: Podiatry

## 2016-08-14 DIAGNOSIS — M79609 Pain in unspecified limb: Secondary | ICD-10-CM | POA: Diagnosis not present

## 2016-08-14 DIAGNOSIS — N183 Chronic kidney disease, stage 3 (moderate): Secondary | ICD-10-CM | POA: Diagnosis not present

## 2016-08-14 DIAGNOSIS — E119 Type 2 diabetes mellitus without complications: Secondary | ICD-10-CM

## 2016-08-14 DIAGNOSIS — E785 Hyperlipidemia, unspecified: Secondary | ICD-10-CM | POA: Diagnosis not present

## 2016-08-14 DIAGNOSIS — I10 Essential (primary) hypertension: Secondary | ICD-10-CM | POA: Diagnosis not present

## 2016-08-14 DIAGNOSIS — B351 Tinea unguium: Secondary | ICD-10-CM

## 2016-08-14 DIAGNOSIS — M2010 Hallux valgus (acquired), unspecified foot: Secondary | ICD-10-CM

## 2016-08-14 DIAGNOSIS — M2041 Other hammer toe(s) (acquired), right foot: Secondary | ICD-10-CM

## 2016-08-14 DIAGNOSIS — M2042 Other hammer toe(s) (acquired), left foot: Secondary | ICD-10-CM

## 2016-08-14 NOTE — Progress Notes (Signed)
Subjective:     Patient ID: Ricky [name]., male   DOB: 1940/09/04, 76 y.o.   MRN: [id number]  HPIThis patient presents to the office for preventive foot care services.  His nails have grown long thick and ingrown on second and third toes both feet.  He is diabetic with no complications. Healing skin lesion right big toe. He has developed a blue inflamed third toe right foot.  No drainage noted.   Review of Systems     Objective:   Physical Exam GENERAL APPEARANCE: Alert, conversant. Appropriately groomed. No acute distress.  VASCULAR: Pedal pulses palpable at  [facility] and PT bilateral.  Capillary refill time is immediate to all digits,  Normal temperature gradient.   NEUROLOGIC: sensation is normal to 5.07 monofilament at 5/5 sites bilateral.  Light touch is intact bilateral, Muscle strength normal.  MUSCULOSKELETAL: acceptable muscle strength, tone and stability bilateral.  Intrinsic muscluature intact bilateral.  Rectus appearance of foot and digits noted bilateral. Hallux malleus B/L Hammer toe 2-4  B/L. DERMATOLOGIC: skin color, texture, and turgor are within normal limits.  No preulcerative lesions or ulcers  are seen, no interdigital maceration noted.  No open lesions present.  . No drainage noted. Healing skin lesion right hallux. Purplish discolored third toe right foot. NAILS  Thick disfigured discolored nails both feet with no drainage or infection.      Assessment:     Onychomycosis Hammer toes    Plan:     Debridement of Nails both feet.  RTC 10 weeks.  Patient developed a blue injured third toe right due to his new shoes.  He was wearing the insole for the shoe as well as movable insole which made his shoe tight.  Therefore the shoe insole was removed. As he left,  He said the shoe felt great.   Gardiner Barefoot DPM

## 2016-09-17 DIAGNOSIS — E118 Type 2 diabetes mellitus with unspecified complications: Secondary | ICD-10-CM | POA: Diagnosis not present

## 2016-09-17 DIAGNOSIS — R05 Cough: Secondary | ICD-10-CM | POA: Diagnosis not present

## 2016-09-17 DIAGNOSIS — I10 Essential (primary) hypertension: Secondary | ICD-10-CM | POA: Diagnosis not present

## 2016-09-24 DIAGNOSIS — E782 Mixed hyperlipidemia: Secondary | ICD-10-CM | POA: Diagnosis not present

## 2016-09-24 DIAGNOSIS — I10 Essential (primary) hypertension: Secondary | ICD-10-CM | POA: Diagnosis not present

## 2016-09-24 DIAGNOSIS — E118 Type 2 diabetes mellitus with unspecified complications: Secondary | ICD-10-CM | POA: Diagnosis not present

## 2016-09-25 DIAGNOSIS — L89312 Pressure ulcer of right buttock, stage 2: Secondary | ICD-10-CM | POA: Diagnosis not present

## 2016-10-02 DIAGNOSIS — M1990 Unspecified osteoarthritis, unspecified site: Secondary | ICD-10-CM | POA: Diagnosis not present

## 2016-10-02 DIAGNOSIS — M6281 Muscle weakness (generalized): Secondary | ICD-10-CM | POA: Diagnosis not present

## 2016-10-02 DIAGNOSIS — L89312 Pressure ulcer of right buttock, stage 2: Secondary | ICD-10-CM | POA: Diagnosis not present

## 2016-10-02 DIAGNOSIS — E119 Type 2 diabetes mellitus without complications: Secondary | ICD-10-CM | POA: Diagnosis not present

## 2016-10-09 ENCOUNTER — Encounter (HOSPITAL_COMMUNITY): Payer: Self-pay | Admitting: *Deleted

## 2016-10-09 ENCOUNTER — Emergency Department (HOSPITAL_COMMUNITY)
Admission: EM | Admit: 2016-10-09 | Discharge: 2016-10-09 | Disposition: A | Discharge status: Home / Self Care | Payer: Medicare Other | Attending: Emergency Medicine | Admitting: Emergency Medicine

## 2016-10-09 DIAGNOSIS — E860 Dehydration: Secondary | ICD-10-CM | POA: Diagnosis not present

## 2016-10-09 DIAGNOSIS — Z8659 Personal history of other mental and behavioral disorders: Secondary | ICD-10-CM

## 2016-10-09 DIAGNOSIS — I10 Essential (primary) hypertension: Secondary | ICD-10-CM | POA: Insufficient documentation

## 2016-10-09 DIAGNOSIS — E119 Type 2 diabetes mellitus without complications: Secondary | ICD-10-CM | POA: Diagnosis not present

## 2016-10-09 DIAGNOSIS — Z794 Long term (current) use of insulin: Secondary | ICD-10-CM | POA: Insufficient documentation

## 2016-10-09 DIAGNOSIS — R440 Auditory hallucinations: Secondary | ICD-10-CM | POA: Diagnosis present

## 2016-10-09 DIAGNOSIS — Z79899 Other long term (current) drug therapy: Secondary | ICD-10-CM | POA: Insufficient documentation

## 2016-10-09 DIAGNOSIS — R9431 Abnormal electrocardiogram [ECG] [EKG]: Secondary | ICD-10-CM | POA: Diagnosis not present

## 2016-10-09 DIAGNOSIS — R451 Restlessness and agitation: Secondary | ICD-10-CM | POA: Diagnosis not present

## 2016-10-09 DIAGNOSIS — L89312 Pressure ulcer of right buttock, stage 2: Secondary | ICD-10-CM | POA: Diagnosis not present

## 2016-10-09 HISTORY — DX: Retention of urine, unspecified: R33.9

## 2016-10-09 LAB — CBC
HEMATOCRIT: 42.7 % (ref 39.0–52.0)
Hemoglobin: 14.1 g/dL (ref 13.0–17.0)
MCH: 29.8 pg (ref 26.0–34.0)
MCHC: 33 g/dL (ref 30.0–36.0)
MCV: 90.3 fL (ref 78.0–100.0)
Platelets: 231 10*3/uL (ref 150–400)
RBC: 4.73 MIL/uL (ref 4.22–5.81)
RDW: 14.3 % (ref 11.5–15.5)
WBC: 10.6 10*3/uL — AB (ref 4.0–10.5)

## 2016-10-09 LAB — RAPID URINE DRUG SCREEN, HOSP PERFORMED
AMPHETAMINES: NOT DETECTED
BARBITURATES: NOT DETECTED
BENZODIAZEPINES: NOT DETECTED
Cocaine: NOT DETECTED
Opiates: NOT DETECTED
Tetrahydrocannabinol: NOT DETECTED

## 2016-10-09 LAB — COMPREHENSIVE METABOLIC PANEL
ALBUMIN: 4.6 g/dL (ref 3.5–5.0)
ALT: 29 U/L (ref 17–63)
AST: 31 U/L (ref 15–41)
Alkaline Phosphatase: 106 U/L (ref 38–126)
Anion gap: 14 (ref 5–15)
BUN: 17 mg/dL (ref 6–20)
CHLORIDE: 97 mmol/L — AB (ref 101–111)
CO2: 25 mmol/L (ref 22–32)
CREATININE: 1.3 mg/dL — AB (ref 0.61–1.24)
Calcium: 9.7 mg/dL (ref 8.9–10.3)
GFR calc Af Amer: 60 mL/min — ABNORMAL LOW (ref >60)
GFR calc non Af Amer: 52 mL/min — ABNORMAL LOW (ref >60)
GLUCOSE: 231 mg/dL — AB (ref 65–99)
POTASSIUM: 3.5 mmol/L (ref 3.5–5.1)
SODIUM: 136 mmol/L (ref 135–145)
Total Bilirubin: 0.6 mg/dL (ref 0.3–1.2)
Total Protein: 8 g/dL (ref 6.5–8.1)

## 2016-10-09 LAB — ACETAMINOPHEN LEVEL: Acetaminophen (Tylenol), Serum: <10 ug/mL — ABNORMAL LOW (ref 10–30)

## 2016-10-09 LAB — URINALYSIS, ROUTINE W REFLEX MICROSCOPIC
Bilirubin Urine: NEGATIVE
Glucose, UA: >=500 mg/dL — AB
Hgb urine dipstick: NEGATIVE
Ketones, ur: NEGATIVE mg/dL
Leukocytes, UA: NEGATIVE
Nitrite: NEGATIVE
Protein, ur: NEGATIVE mg/dL
RBC / HPF: NONE SEEN RBC/hpf (ref 0–5)
Specific Gravity, Urine: 1.013 (ref 1.005–1.030)
pH: 6 (ref 5.0–8.0)

## 2016-10-09 LAB — SALICYLATE LEVEL: Salicylate Lvl: <7 mg/dL (ref 2.8–30.0)

## 2016-10-09 LAB — ETHANOL

## 2016-10-09 MED ORDER — LORAZEPAM 1 MG PO TABS
1.0000 mg | ORAL_TABLET | Freq: Once | ORAL | Status: AC
  Administered 2016-10-09: 1 mg via ORAL
  Filled 2016-10-09: qty 1

## 2016-10-09 MED ORDER — SODIUM CHLORIDE 0.9 % IV BOLUS (SEPSIS)
1000.0000 mL | Freq: Once | INTRAVENOUS | Status: AC
  Administered 2016-10-09: 1000 mL via INTRAVENOUS

## 2016-10-09 MED ORDER — MAGNESIUM SULFATE 2 GM/50ML IV SOLN
2.0000 g | Freq: Once | INTRAVENOUS | Status: AC
  Administered 2016-10-09: 2 g via INTRAVENOUS
  Filled 2016-10-09: qty 50

## 2016-10-09 MED ORDER — SODIUM CHLORIDE 0.9 % IV BOLUS (SEPSIS)
500.0000 mL | Freq: Once | INTRAVENOUS | Status: AC
  Administered 2016-10-09: 500 mL via INTRAVENOUS

## 2016-10-09 MED ORDER — LORAZEPAM 0.5 MG PO TABS
0.5000 mg | ORAL_TABLET | Freq: Once | ORAL | Status: AC
  Administered 2016-10-09: 0.5 mg via ORAL
  Filled 2016-10-09: qty 1

## 2016-10-09 NOTE — ED Notes (Signed)
Bed: [facility]ALA Expected date:  Expected time:  Means of arrival:  Comments: 

## 2016-10-09 NOTE — Discharge Instructions (Signed)
Drink plenty of fluids and get plenty of rest. Follow-up with your psychiatrist outpatient. Return to the ED if any concerning signs or symptoms develop.

## 2016-10-09 NOTE — ED Notes (Signed)
Bed: WA08 Expected date:  Expected time:  Means of arrival:  Comments: EMS 

## 2016-10-09 NOTE — ED Notes (Signed)
Bed: [facility]PT4 Expected date:  Expected time:  Means of arrival:  Comments: 

## 2016-10-09 NOTE — BH Assessment (Addendum)
Assessment Note  Ricky [name]. is an 76 y.o. male with history of Schizophrenia and depression. Pt presents voluntarily for a TTS assessment BIB by EMS. Per ED notes, "Pt from nursing home who is here with religious delusions. Thought he was, have a psychotic break according to nursing home staff. He is alert and oriented 4. Does not have any visual hallucinations."  Writer met with patient to complete a TTS assessment. Pt is cooperative and oriented to person and place only. He tells me that the year is 2080 and it's Saturday. He says that he has been "religiously occupied" lately. Pt goes on to say that "I want to be closer to God".  He says that he wants to maintain his salvation because he has religious beliefs. Pt reports that that he was listening the radio today and became worried that he was as close to God as he should be. Pt appears calm. Pt denies SI and HI. No delusions noted. Pt reports at his baseline he hears a "voice". Pt reports the voice is never command type nor tells him to do anything bad. Denies alcohol and drug use.  He has a psychiatrist, Dr Janell Quiet at [facility]. He reports that he went to [facility] approx 45-50 years ago d/t schizophrenia. The reason for his admission at that time was, "Not listening to my parents". Pt says that he is compliant with his psych meds. Pt's affect is appropriate. He reports his mood, "Great as long as I stay on the right path and maintain my relationship with God".  Pt goes on to speak more about his religious beliefs. He says, "I do believe I'm saved." He says, "I'm not depressed and I love my life".   Pt states that is unable to ambulate and uses a wheelchair. He also requires assistance with his ADL's such as dressing, toiletry, and grooming. He does has a caregiver. He is not close to any of his family. He has resided at [facility] for 5 years and likes it at the facility.  Diagnosis: Schizophrenia (per history)  Past Medical History:   Past Medical History:  Diagnosis Date  . Abnormality of gait   . Adenomatous colon polyp   . Arthritis   . Cataract   . Dementia   . Depression   . Diabetes mellitus without complication ([place])   . Fatty liver   . Hyperlipidemia   . Hypertension   . Internal hemorrhoids   . Other malaise and fatigue   . Peripheral edema   . Schizophrenia ([place])   . Type II or unspecified type diabetes mellitus without mention of complication, not stated as uncontrolled   . Urinary frequency   . Urinary retention     Past Surgical History:  Procedure Laterality Date  . BALLOON DILATION N/A 05/22/2012   Procedure: BALLOON DILATION;  Surgeon: Beryle Beams, MD;  Location: [facility];  Service: Endoscopy;  Laterality: N/A;  . BALLOON DILATION N/A 06/09/2015   Procedure: BALLOON DILATION;  Surgeon: Carol Ada, MD;  Location: [facility];  Service: Endoscopy;  Laterality: N/A;  . ESOPHAGOGASTRODUODENOSCOPY  02/21/2012   Procedure: ESOPHAGOGASTRODUODENOSCOPY (EGD);  Surgeon: Beryle Beams, MD;  Location: [facility];  Service: Endoscopy;  Laterality: N/A;  . ESOPHAGOGASTRODUODENOSCOPY N/A 05/22/2012   Procedure: ESOPHAGOGASTRODUODENOSCOPY (EGD);  Surgeon: Beryle Beams, MD;  Location: [facility];  Service: Endoscopy;  Laterality: N/A;  . ESOPHAGOGASTRODUODENOSCOPY N/A 06/17/2014   Procedure: ESOPHAGOGASTRODUODENOSCOPY (EGD);  Surgeon: Carol Ada, MD;  Location: [facility];  Service: Endoscopy;  Laterality: N/A;  . ESOPHAGOGASTRODUODENOSCOPY N/A 08/10/2014   Procedure: ESOPHAGOGASTRODUODENOSCOPY (EGD);  Surgeon: Carol Ada, MD;  Location: [facility];  Service: Endoscopy;  Laterality: N/A;  . ESOPHAGOGASTRODUODENOSCOPY N/A 05/28/2015   Procedure: ESOPHAGOGASTRODUODENOSCOPY (EGD);  Surgeon: Gatha Mayer, MD;  Location: [facility];  Service: Endoscopy;  Laterality: N/A;  . ESOPHAGOGASTRODUODENOSCOPY (EGD) WITH PROPOFOL N/A 06/09/2015   Procedure: ESOPHAGOGASTRODUODENOSCOPY (EGD) WITH PROPOFOL;   Surgeon: Carol Ada, MD;  Location: [facility];  Service: Endoscopy;  Laterality: N/A;  . SAVORY DILATION N/A 06/17/2014   Procedure: SAVORY DILATION;  Surgeon: Carol Ada, MD;  Location: [facility];  Service: Endoscopy;  Laterality: N/A;  . TONSILLECTOMY      Family History:  Family History  Problem Relation Age of Onset  . Brain cancer Father     Social History:  reports that he has never smoked. He has never used smokeless tobacco. He reports that he does not drink alcohol or use drugs.  Additional Social History:     CIWA: CIWA-Ar BP: (!) 165/80 Pulse Rate: (!) 128 COWS:    Allergies:  Allergies  Allergen Reactions  . Asa [Aspirin]     "Dr told me not to take it"  . Codeine Other (See Comments)    DIZZINESS with Tylenol 3  . Tylenol [Acetaminophen] Other (See Comments)    Dizziness with tylenol 3    Home Medications:  (Not in a hospital admission)  OB/GYN Status:  No LMP for male patient.  General Assessment Data TTS Assessment: In system Is this a Tele or Face-to-Face Assessment?: Face-to-Face Is this an Initial Assessment or a Re-assessment for this encounter?: Initial Assessment Marital status: Single Maiden name:  (n/a) Is patient pregnant?: No Pregnancy Status: No Living Arrangements: Other (Comment) ([name] ) Can pt return to current living arrangement?: Yes Admission Status: Voluntary Is patient capable of signing voluntary admission?: Yes Referral Source: Self/Family/Friend Insurance type:  Production manager )     Crisis Care Plan Living Arrangements: Other (Comment) ([name] ) Legal Guardian: Other: (no legal guardian ) Name of Psychiatrist:  (Dr. Clovis Pu ) Name of Therapist:  (n/a)  Education Status Is patient currently in school?: No Current Grade:  (n/a) Highest grade of school patient has completed:  (unk) Name of school:  (n/a) Contact person:  (n/a)  Risk to self with the past 6 months Suicidal Ideation:  No Has patient been a risk to self within the past 6 months prior to admission? : No Suicidal Intent: No Has patient had any suicidal intent within the past 6 months prior to admission? : No Is patient at risk for suicide?: No Suicidal Plan?: No Has patient had any suicidal plan within the past 6 months prior to admission? : No Access to Means: No What has been your use of drugs/alcohol within the last 12 months?:  (denies ) Previous Attempts/Gestures: No How many times?:  (0) Other Self Harm Risks:  (denies self harm ) Triggers for Past Attempts: Other (Comment) (n/a) Intentional Self Injurious Behavior: None Family Suicide History:  (no family history of mental health illness) Recent stressful life event(s): Other (Comment) (no stressful life event ) Persecutory voices/beliefs?: No Depression: No Depression Symptoms:  (n/a) Substance abuse history and/or treatment for substance abuse?: No Suicide prevention information given to non-admitted patients: Not applicable  Risk to Others within the past 6 months Homicidal Ideation: No Does patient have any lifetime risk of violence toward others beyond the six months prior to admission? : No  Thoughts of Harm to Others: No Current Homicidal Intent: No Current Homicidal Plan: No Access to Homicidal Means: No Identified Victim:  (n/a) History of harm to others?: No Assessment of Violence: None Noted Violent Behavior Description:  (currently calm and cooperative ) Does patient have access to weapons?: No Criminal Charges Pending?: No Does patient have a court date: No Is patient on probation?: No  Psychosis Hallucinations: None noted Delusions: None noted  Mental Status Report Appearance/Hygiene: Disheveled Eye Contact: Good Motor Activity: Freedom of movement Speech: Logical/coherent Level of Consciousness: Alert Mood: Depressed Affect: Appropriate to circumstance Anxiety Level: None Thought Processes: Relevant Judgement:  Impaired Orientation: Person, Place Obsessive Compulsive Thoughts/Behaviors: None  Cognitive Functioning Concentration: Decreased Memory: Recent Intact, Recent Impaired IQ: Average Insight: Fair Impulse Control: Good Appetite: Good Weight Loss:  (none reported) Weight Gain:  (none reported ) Sleep: Decreased Total Hours of Sleep:  (varies ) Vegetative Symptoms: None  ADLScreening [facility] Assessment Services) Patient's cognitive ability adequate to safely complete daily activities?: Yes Patient able to express need for assistance with ADLs?: Yes Independently performs ADLs?: Yes (appropriate for developmental age)  Prior Inpatient Therapy Prior Inpatient Therapy: Yes Prior Therapy Dates:  (45-50 years ago ) Prior Therapy Facilty/Provider(s):  [facility]) Reason for Treatment:  ("Because I didn't want to listen to my parents")  Prior Outpatient Therapy Prior Outpatient Therapy: Yes Prior Therapy Dates:  (current) Prior Therapy Facilty/Provider(s):  (Dr. Janell Quiet with [facility]) Reason for Treatment:  (medication managment) Does patient have an ACCT team?: No Does patient have Intensive In-House Services?  : No Does patient have [facility] services? : No Does patient have P4CC services?: No  ADL Screening (condition at time of admission) Patient's cognitive ability adequate to safely complete daily activities?: Yes Is the patient deaf or have difficulty hearing?: No Does the patient have difficulty seeing, even when wearing glasses/contacts?: No Does the patient have difficulty concentrating, remembering, or making decisions?: No Patient able to express need for assistance with ADLs?: Yes Does the patient have difficulty dressing or bathing?: No Independently performs ADLs?: Yes (appropriate for developmental age) Does the patient have difficulty walking or climbing stairs?: No Weakness of Legs: None Weakness of Arms/Hands: None  Home Assistive Devices/Equipment Home  Assistive Devices/Equipment: None    Abuse/Neglect Assessment (Assessment to be complete while patient is alone) Physical Abuse: Denies Verbal Abuse: Denies Sexual Abuse: Denies Exploitation of patient/patient's resources: Denies Self-Neglect: Denies Values / Beliefs Cultural Requests During Hospitalization: None Spiritual Requests During Hospitalization: None   Advance Directives (For Healthcare) Does Patient Have a Medical Advance Directive?: No Would patient like information on creating a medical advance directive?: No - Patient declined Nutrition Screen- [facility] Patient's home diet: Regular  Additional Information 1:1 In Past 12 Months?: No CIRT Risk: No Elopement Risk: No Does patient have medical clearance?: Yes     Disposition: Per Jinny Blossom, NP, no criteria for INPT treatment. Psychiatrically cleared.  Disposition Initial Assessment Completed for this Encounter: Yes Disposition of Patient: Other dispositions (Per Jinny Blossom, NP not criteria; Pyschiatrically cleared )  On Site Evaluation by:   Reviewed with Physician:    Waldon Merl 10/09/2016 4:20 PM

## 2016-10-09 NOTE — ED Notes (Signed)
Helped patient get in floor after placing his knee pads in floor for him to lean on. Patient attempted to use urinal but unable to void at this time.

## 2016-10-09 NOTE — ED Provider Notes (Signed)
[facility] DEPT Provider Note   CSN: [id number] Arrival date & time: 10/09/16  1408     History   Chief Complaint Chief Complaint  Patient presents with  . Medical Clearance   HPI Ricky [name]. is a 76 y.o. male with history of schizophrenia, depression, HTN, HLD, DM, dementia who presents today brought in by EMS for evaluation of possible psychosis. He states he is hearing voices "that are telling me I will never be saved. All I want is for salvation but my psychiatric condition won't let that happen". He states that he has heard these voices in the past. He denies suicidal ideation or homicidal ideation. He has been seen in the past for similar religious thoughts and found not to meet inpatient criteria. He does see a psychiatrist regularly. He was hospitalized once 45 years ago for his schizophrenia. He does ambulate with a wheelchair and has chronic left knee problems.  Spoke with Mickel Baas at [facility] who states pt called 911 himself and was adamant he needed to go to the ED for "psychiatric problems". Pt seemed agitated/anxious and looked like he was having signs of tardive dyskinesia per Mickel Baas. She states patient has been behaving normally at his mental status baseline and has had his morning medications. She states that he has otherwise been well with no complaints of fever, shortness of breath, chest pain, abdominal pain, nausea, or vomiting. She states that he lives in the assisted living portion of the facility and "gets by fine on his own in his wheelchair ".  The history is provided by the patient.    Past Medical History:  Diagnosis Date  . Abnormality of gait   . Adenomatous colon polyp   . Arthritis   . Cataract   . Dementia   . Depression   . Diabetes mellitus without complication ([place])   . Fatty liver   . Hyperlipidemia   . Hypertension   . Internal hemorrhoids   . Other malaise and fatigue   . Peripheral edema   . Schizophrenia ([place])     . Type II or unspecified type diabetes mellitus without mention of complication, not stated as uncontrolled   . Urinary frequency   . Urinary retention     Patient Active Problem List   Diagnosis Date Noted  . Food impaction of esophagus   . Esophageal stricture   . Hiatal hernia   . Urinary retention 12/10/2012  . Rhabdomyolysis 12/10/2012  . Falls 12/10/2012  . First degree AV block 10/20/2012  . Extremity muscle atrophy 10/20/2012  . Lumbosacral plexopathy 07/20/2012  . Diabetes mellitus without complication ([place])   . Hypertension   . Depression   . Arthritis   . Hyperlipidemia   . Cataract   . Urinary frequency   . Fatty liver   . Schizophrenia ([place])   . Peripheral edema   . Internal hemorrhoids   . Adenomatous colon polyp   . Type II or unspecified type diabetes mellitus without mention of complication, not stated as uncontrolled   . Type II or unspecified type diabetes mellitus with neurological manifestations, not stated as uncontrolled(250.60) 03/20/2012  . Abnormality of gait 03/20/2012  . Other malaise and fatigue 03/20/2012    Past Surgical History:  Procedure Laterality Date  . BALLOON DILATION N/A 05/22/2012   Procedure: BALLOON DILATION;  Surgeon: Beryle Beams, MD;  Location: [facility];  Service: Endoscopy;  Laterality: N/A;  . BALLOON DILATION N/A 06/09/2015   Procedure: BALLOON DILATION;  Surgeon: Carol Ada, MD;  Location: [facility];  Service: Endoscopy;  Laterality: N/A;  . ESOPHAGOGASTRODUODENOSCOPY  02/21/2012   Procedure: ESOPHAGOGASTRODUODENOSCOPY (EGD);  Surgeon: Beryle Beams, MD;  Location: [facility];  Service: Endoscopy;  Laterality: N/A;  . ESOPHAGOGASTRODUODENOSCOPY N/A 05/22/2012   Procedure: ESOPHAGOGASTRODUODENOSCOPY (EGD);  Surgeon: Beryle Beams, MD;  Location: [facility];  Service: Endoscopy;  Laterality: N/A;  . ESOPHAGOGASTRODUODENOSCOPY N/A 06/17/2014   Procedure: ESOPHAGOGASTRODUODENOSCOPY (EGD);  Surgeon: Carol Ada,  MD;  Location: [facility];  Service: Endoscopy;  Laterality: N/A;  . ESOPHAGOGASTRODUODENOSCOPY N/A 08/10/2014   Procedure: ESOPHAGOGASTRODUODENOSCOPY (EGD);  Surgeon: Carol Ada, MD;  Location: [facility];  Service: Endoscopy;  Laterality: N/A;  . ESOPHAGOGASTRODUODENOSCOPY N/A 05/28/2015   Procedure: ESOPHAGOGASTRODUODENOSCOPY (EGD);  Surgeon: Gatha Mayer, MD;  Location: [facility];  Service: Endoscopy;  Laterality: N/A;  . ESOPHAGOGASTRODUODENOSCOPY (EGD) WITH PROPOFOL N/A 06/09/2015   Procedure: ESOPHAGOGASTRODUODENOSCOPY (EGD) WITH PROPOFOL;  Surgeon: Carol Ada, MD;  Location: [facility];  Service: Endoscopy;  Laterality: N/A;  . SAVORY DILATION N/A 06/17/2014   Procedure: SAVORY DILATION;  Surgeon: Carol Ada, MD;  Location: [facility];  Service: Endoscopy;  Laterality: N/A;  . TONSILLECTOMY         Home Medications    Prior to Admission medications   Medication Sig Start Date End Date Taking? Authorizing Provider  ALPRAZolam Duanne Moron) 0.5 MG tablet Take 0.5 mg by mouth at bedtime.   Yes [provider]  Amino Acids-Protein Hydrolys (FEEDING SUPPLEMENT, PRO-STAT SUGAR FREE 64,) LIQD Take 30 mLs by mouth 2 (two) times daily with a meal.   Yes [provider]  amLODipine (NORVASC) 5 MG tablet Take 5 mg by mouth daily. 09/04/12  Yes [provider]  Calcium Citrate-Vitamin D (CITRACAL + D PO) Take 1 tablet by mouth daily.   Yes [provider]  chlorhexidine (PERIDEX) 0.12 % solution Use as directed 15 mLs in the mouth or throat at bedtime.    Yes [provider]  Cholecalciferol (VITAMIN D-3) 1000 units CAPS Take 1 capsule by mouth daily.   Yes [provider]  empagliflozin (JARDIANCE) 10 MG TABS tablet Take 10 mg by mouth daily.   Yes [provider]  fluvoxaMINE (LUVOX) 100 MG tablet Take 100 mg by mouth at bedtime.   Yes [provider]  furosemide (LASIX) 20 MG tablet Take 20 mg by mouth daily  before breakfast.  12/07/11  Yes [provider]  hydrOXYzine (ATARAX/VISTARIL) 25 MG tablet Take 25 mg by mouth at bedtime as needed (sleep).   Yes [provider]  insulin detemir (LEVEMIR) 100 UNIT/ML injection Inject 40 Units into the skin at bedtime.    Yes [provider]  meloxicam (MOBIC) 15 MG tablet Take 15 mg by mouth daily.   Yes [provider]  metFORMIN (GLUCOPHAGE) 500 MG tablet Take 500 mg by mouth 2 (two) times daily with a meal.   Yes [provider]  Multiple Vitamins-Minerals (DECUBI-VITE PO) Take 1 tablet by mouth daily.   Yes [provider]  MYRBETRIQ 25 MG TB24 tablet Take 25 mg by mouth daily.  10/05/16  Yes [provider]  NOVOLOG FLEXPEN 100 UNIT/ML FlexPen Inject 0-9 Units into the skin 3 (three) times daily as needed for high blood sugar. 101-150=1 units  151-200=2 units 201-250=3 units 251-300=5 units  301-350=7 units >350=9 units Call MD FOR BS>400 or <60 400 > call MD 07/17/16  Yes [provider]  OLANZapine (ZYPREXA) 20 MG tablet  Take 20 mg by mouth at bedtime.   Yes [provider]  omega-3 acid ethyl esters (LOVAZA) 1 G capsule Take 2 g by mouth daily.    Yes [provider]  omeprazole (PRILOSEC) 40 MG capsule Take 40 mg by mouth 2 (two) times daily.  09/28/12  Yes [provider]  ONGLYZA 5 MG TABS tablet Take 5 mg by mouth daily before breakfast.  12/06/11  Yes [provider]  pravastatin (PRAVACHOL) 40 MG tablet Take 40 mg by mouth daily after lunch.    Yes [provider]  Specialty Vitamins Products (PROSTATE) TABS Take 1 tablet by mouth 2 (two) times daily.   Yes [provider]  tamsulosin (FLOMAX) 0.4 MG CAPS capsule Take 0.4 mg by mouth daily.  10/01/16  Yes [provider]  zolpidem (AMBIEN) 5 MG tablet Take 5 mg by mouth at bedtime.  09/24/16  Yes [provider]  traMADol (ULTRAM) 50 MG tablet Take 50 mg by  mouth every 6 (six) hours as needed for moderate pain.    [provider]    Family History Family History  Problem Relation Age of Onset  . Brain cancer Father     Social History Social History  Substance Use Topics  . Smoking status: Never Smoker  . Smokeless tobacco: Never Used  . Alcohol use No     Allergies   Asa [aspirin]; Codeine; and Tylenol [acetaminophen]   Review of Systems Review of Systems  Constitutional: Negative for chills and fever.  Respiratory: Negative for shortness of breath.   Cardiovascular: Negative for chest pain.  Gastrointestinal: Negative for abdominal pain.  Musculoskeletal: Positive for arthralgias (left knee, chronic, unchanged).  Neurological: Negative for light-headedness and headaches.  Psychiatric/Behavioral: Positive for hallucinations (auditory, chronic, unchanged). Negative for self-injury and suicidal ideas. The patient is nervous/anxious.   All other systems reviewed and are negative.    Physical Exam Updated Vital Signs BP (!) 153/90   Pulse (!) 112   Temp 98.5 F (36.9 C) (Oral)   Resp (!) 23   Ht 5\' 9"  (1.753 m)   Wt 100.7 kg (222 lb)   SpO2 94%   BMI 32.78 kg/m   Physical Exam  Constitutional: He appears well-developed and well-nourished.  HENT:  Head: Normocephalic and atraumatic.  Eyes: Pupils are equal, round, and reactive to light. Conjunctivae and EOM are normal. Right eye exhibits no discharge. Left eye exhibits no discharge.  Neck: Normal range of motion. Neck supple. No JVD present.  Cardiovascular: Regular rhythm and intact distal pulses.   Tachycardic, 2+ radial and DP/PT pulses bl, negative Homan's bl  Pulmonary/Chest: Effort normal and breath sounds normal. No respiratory distress. He has no wheezes. He has no rales. He exhibits no tenderness.  Abdominal: Soft. Bowel sounds are normal. He exhibits no distension. There is no tenderness.  Musculoskeletal:  Right knee diffusely tender to  palpation, patient states this is chronic and unchanged. No swelling, deformity, crepitus, or ecchymosis noted. No varus or valgus deformity, negative anterior/posterior drawer test. 5/5 strength of BUE and BLE major muscle groups  Neurological: He is alert. No cranial nerve deficit or sensory deficit. He exhibits normal muscle tone.  Oriented to person and place but not time, fluent speech, no facial droop, sensation intact to soft touch of extremities.   Skin: Skin is warm and dry.     ED Treatments / Results  Labs (all labs ordered are listed, but only abnormal results are displayed) Labs Reviewed  COMPREHENSIVE METABOLIC PANEL - Abnormal; Notable for the following:       Result Value   Chloride 97 (*)    Glucose, Bld 231 (*)    Creatinine, Ser 1.30 (*)    GFR calc non Af Amer 52 (*)    GFR calc Af Amer 60 (*)    All other components within normal limits  ACETAMINOPHEN LEVEL - Abnormal; Notable for the following:    Acetaminophen (Tylenol), Serum <10 (*)    All other components within normal limits  CBC - Abnormal; Notable for the following:    WBC 10.6 (*)    All other components within normal limits  URINALYSIS, ROUTINE W REFLEX MICROSCOPIC - Abnormal; Notable for the following:    Glucose, UA >=500 (*)    Bacteria, UA RARE (*)    Squamous Epithelial / LPF 0-5 (*)    All other components within normal limits  ETHANOL  SALICYLATE LEVEL  RAPID URINE DRUG SCREEN, HOSP PERFORMED  TSH    EKG  EKG Interpretation  Date/Time:  Wednesday October 09 2016 16:37:56 EDT Ventricular Rate:  114 PR Interval:    QRS Duration: 95 QT Interval:  353 QTC Calculation: 487 R Axis:   85 Text Interpretation:  Suspect sinus rhythm, difficult to interpret due to artifact Borderline right axis deviation Minimal ST depression, inferior leads Borderline prolonged QT interval Since prior ECG, rhythm is difficult to interpret Confirmed by Gareth Morgan [phone]) on 10/09/2016 4:52:15 PM        Radiology No results found.  Procedures Procedures (including critical care time)  Medications Ordered in ED Medications  sodium chloride 0.9 % bolus 500 mL (not administered)  LORazepam (ATIVAN) tablet 0.5 mg (not administered)  sodium chloride 0.9 % bolus 1,000 mL (0 mLs Intravenous Stopped 10/09/16 1741)  LORazepam (ATIVAN) tablet 1 mg (1 mg Oral Given 10/09/16 1749)  magnesium sulfate IVPB 2 g 50 mL (0 g Intravenous Stopped 10/09/16 1849)     Initial Impression / Assessment and Plan / ED Course  I have reviewed the triage vital signs and the nursing notes.  Pertinent labs & imaging results that were available during my care of the patient were reviewed by me and considered in my medical decision making (see chart for details).     Pleasantly demented patient presents requesting psychiatric evaluation. Afebrile, tachycardic and hypertensive initially with improvement while in the ED. He is alert and oriented and does not appear delirious. He has had evaluation in the past for similar symptoms which is typically managed outpatient. He does have psychiatric follow-up regularly at [facility]. EKG is difficult to interpret, but he appears to be in sinus rhythm. No ST segment elevation noted. I doubt ACS or MI contributing to his symptoms as he has no complaints of chest pain or any anginal equivalents. Doubt PE in the absence of SOB, CP, with no clinical symptoms of PE and pt is low risk per Wells' criteria. Lab work suggests that he is dehydrated with glucose in his urine and slight elevation in creatinine,and improving tachycardia after fluid administration also supports this. He is psychiatrically cleared at this time and does not meet inpatient criteria for admission.He is stable for discharge back to his nursing home with outpatient psychiatric follow-up. Discussed this with the patient. Discussed indications for return to the ED. Pt verbalized understanding of and agreement with plan  and is safe for discharge home at this time. Patient seen and evaluated by Dr. Zenia Resides, who agrees with plan  and assessment.   Final Clinical Impressions(s) / ED Diagnoses   Final diagnoses:  Dehydration  History of schizophrenia    New Prescriptions New Prescriptions   No medications on file     Renita Papa, PA-C 10/09/16 2300

## 2016-10-09 NOTE — ED Triage Notes (Signed)
EMS states pt resides at [facility], started to get upset due to wanting to get closer to GOD, due to religious and health problems he feels he is not getting closer. [employer] on sceen with EMS.

## 2016-10-09 NOTE — Progress Notes (Signed)
    Pt was seen by this provider. Pt denies suicidal/homicidal ideations, auditory and visual hallucinations, and does not appear to be responding to internal stimuli. Discussed case with Dr Dwyane Dee and Pt is psychiatrically clear.   Ethelene Hal, NP-C 10/09/2016   1624

## 2016-10-09 NOTE — ED Provider Notes (Signed)
Medical screening examination/treatment/procedure(s) were conducted as a shared visit with non-physician practitioner(s) and myself.  I personally evaluated the patient during the encounter.   EKG Interpretation None     76 year old male from nursing home who is here with religious delusions. Thought he was, have a psychotic break according to nursing home staff. He is alert and oriented 4. Does not have any visual hallucinations. He is not tachycardic here but does have elevated creatinine. Suspect dehydration. Will check urinalysis as well as baseline labs. Will consult TTS after medically cleared   Lacretia Leigh, MD 10/09/16 [phone]

## 2016-10-23 ENCOUNTER — Ambulatory Visit (INDEPENDENT_AMBULATORY_CARE_PROVIDER_SITE_OTHER): Payer: Medicare Other | Admitting: Podiatry

## 2016-10-23 ENCOUNTER — Encounter: Payer: Self-pay | Admitting: Podiatry

## 2016-10-23 DIAGNOSIS — B351 Tinea unguium: Secondary | ICD-10-CM | POA: Diagnosis not present

## 2016-10-23 DIAGNOSIS — E119 Type 2 diabetes mellitus without complications: Secondary | ICD-10-CM

## 2016-10-23 DIAGNOSIS — M79609 Pain in unspecified limb: Secondary | ICD-10-CM

## 2016-10-23 NOTE — Progress Notes (Signed)
Subjective:     Patient ID: Ricky [name]., male   DOB: [birth year], 76 y.o.   MRN: [id number]  HPIThis patient presents to the office for preventive foot care services.  His nails have grown long thick and ingrown on second and third toes both feet.  He is diabetic with no complications   Review of Systems     Objective:   Physical Exam GENERAL APPEARANCE: Alert, conversant. Appropriately groomed. No acute distress.  VASCULAR: Pedal pulses palpable at  [facility] and PT bilateral.  Capillary refill time is immediate to all digits,  Normal temperature gradient.  Digital hair growth is present bilateral  NEUROLOGIC: sensation is normal to 5.07 monofilament at 5/5 sites bilateral.  Light touch is intact bilateral, Muscle strength normal.  MUSCULOSKELETAL: acceptable muscle strength, tone and stability bilateral.  Intrinsic muscluature intact bilateral.  Rectus appearance of foot and digits noted bilateral. Hallux malleus B/L  DERMATOLOGIC: skin color, texture, and turgor are within normal limits.  No preulcerative lesions or ulcers  are seen, no interdigital maceration noted.  No open lesions present.  . No drainage noted. Healing skin lesion right hallux. NAILS  Thick disfigured discolored nails both feet with no drainage or infection.      Assessment:     Onychomycosis     Plan:     Debridement of Nails both feet.  RTC 10 weeks.     Gardiner Barefoot DPM

## 2016-12-26 DIAGNOSIS — N390 Urinary tract infection, site not specified: Secondary | ICD-10-CM | POA: Diagnosis not present

## 2016-12-26 DIAGNOSIS — I10 Essential (primary) hypertension: Secondary | ICD-10-CM | POA: Diagnosis not present

## 2016-12-26 DIAGNOSIS — Z125 Encounter for screening for malignant neoplasm of prostate: Secondary | ICD-10-CM | POA: Diagnosis not present

## 2016-12-26 DIAGNOSIS — E782 Mixed hyperlipidemia: Secondary | ICD-10-CM | POA: Diagnosis not present

## 2016-12-26 DIAGNOSIS — Z0000 Encounter for general adult medical examination without abnormal findings: Secondary | ICD-10-CM | POA: Diagnosis not present

## 2016-12-26 DIAGNOSIS — E118 Type 2 diabetes mellitus with unspecified complications: Secondary | ICD-10-CM | POA: Diagnosis not present

## 2017-01-01 ENCOUNTER — Ambulatory Visit: Payer: Medicare Other | Admitting: Podiatry

## 2017-01-01 ENCOUNTER — Encounter: Payer: Self-pay | Admitting: Podiatry

## 2017-01-01 DIAGNOSIS — B351 Tinea unguium: Secondary | ICD-10-CM | POA: Diagnosis not present

## 2017-01-01 DIAGNOSIS — M6281 Muscle weakness (generalized): Secondary | ICD-10-CM | POA: Diagnosis not present

## 2017-01-01 DIAGNOSIS — E119 Type 2 diabetes mellitus without complications: Secondary | ICD-10-CM

## 2017-01-01 DIAGNOSIS — M2041 Other hammer toe(s) (acquired), right foot: Secondary | ICD-10-CM

## 2017-01-01 DIAGNOSIS — M79609 Pain in unspecified limb: Secondary | ICD-10-CM

## 2017-01-01 DIAGNOSIS — M2042 Other hammer toe(s) (acquired), left foot: Secondary | ICD-10-CM

## 2017-01-01 NOTE — Progress Notes (Signed)
Subjective:     Patient ID: Ricky [name]., male   DOB: 04/21/1940, 76 y.o.   MRN: [id number]  HPIThis patient presents to the office for preventive foot care services.  His nails have grown long thick and ingrown on second and third toes both feet.  He is diabetic with no complications   Review of Systems     Objective:   Physical Exam GENERAL APPEARANCE: Alert, conversant. Appropriately groomed. No acute distress.  VASCULAR: Pedal pulses palpable at  [facility] and PT bilateral.  Capillary refill time is immediate to all digits,  Normal temperature gradient.  Digital hair growth is present bilateral  NEUROLOGIC: sensation is normal to 5.07 monofilament at 5/5 sites bilateral.  Light touch is intact bilateral, Muscle strength normal.  MUSCULOSKELETAL: acceptable muscle strength, tone and stability bilateral.  Intrinsic muscluature intact bilateral.  Rectus appearance of foot and digits noted bilateral. Hallux malleus B/L  DERMATOLOGIC: skin color, texture, and turgor are within normal limits.  No preulcerative lesions or ulcers  are seen, no interdigital maceration noted.  No open lesions present.  . No drainage noted. Healing skin lesion right hallux.  Hammer toes  B/L NAILS  Thick disfigured discolored nails both feet with no drainage or infection.      Assessment:     Onychomycosis     Plan:     Debridement of Nails both feet.  RTC 10 weeks.     Gardiner Barefoot DPM

## 2017-01-02 DIAGNOSIS — E118 Type 2 diabetes mellitus with unspecified complications: Secondary | ICD-10-CM | POA: Diagnosis not present

## 2017-01-02 DIAGNOSIS — Z0000 Encounter for general adult medical examination without abnormal findings: Secondary | ICD-10-CM | POA: Diagnosis not present

## 2017-01-02 DIAGNOSIS — I10 Essential (primary) hypertension: Secondary | ICD-10-CM | POA: Diagnosis not present

## 2017-01-02 DIAGNOSIS — K760 Fatty (change of) liver, not elsewhere classified: Secondary | ICD-10-CM | POA: Diagnosis not present

## 2017-01-02 DIAGNOSIS — Z993 Dependence on wheelchair: Secondary | ICD-10-CM | POA: Diagnosis not present

## 2017-01-08 DIAGNOSIS — L988 Other specified disorders of the skin and subcutaneous tissue: Secondary | ICD-10-CM | POA: Diagnosis not present

## 2017-01-15 DIAGNOSIS — L988 Other specified disorders of the skin and subcutaneous tissue: Secondary | ICD-10-CM | POA: Diagnosis not present

## 2017-01-22 DIAGNOSIS — L988 Other specified disorders of the skin and subcutaneous tissue: Secondary | ICD-10-CM | POA: Diagnosis not present

## 2017-01-24 DIAGNOSIS — L308 Other specified dermatitis: Secondary | ICD-10-CM | POA: Diagnosis not present

## 2017-01-24 DIAGNOSIS — I10 Essential (primary) hypertension: Secondary | ICD-10-CM | POA: Diagnosis not present

## 2017-01-24 DIAGNOSIS — E119 Type 2 diabetes mellitus without complications: Secondary | ICD-10-CM | POA: Diagnosis not present

## 2017-01-29 DIAGNOSIS — L988 Other specified disorders of the skin and subcutaneous tissue: Secondary | ICD-10-CM | POA: Diagnosis not present

## 2017-02-01 DIAGNOSIS — M6281 Muscle weakness (generalized): Secondary | ICD-10-CM | POA: Diagnosis not present

## 2017-02-05 DIAGNOSIS — L988 Other specified disorders of the skin and subcutaneous tissue: Secondary | ICD-10-CM | POA: Diagnosis not present

## 2017-02-12 DIAGNOSIS — E1122 Type 2 diabetes mellitus with diabetic chronic kidney disease: Secondary | ICD-10-CM | POA: Diagnosis not present

## 2017-02-12 DIAGNOSIS — I10 Essential (primary) hypertension: Secondary | ICD-10-CM | POA: Diagnosis not present

## 2017-02-12 DIAGNOSIS — N183 Chronic kidney disease, stage 3 (moderate): Secondary | ICD-10-CM | POA: Diagnosis not present

## 2017-03-04 DIAGNOSIS — M6281 Muscle weakness (generalized): Secondary | ICD-10-CM | POA: Diagnosis not present

## 2017-04-01 DIAGNOSIS — M6281 Muscle weakness (generalized): Secondary | ICD-10-CM | POA: Diagnosis not present

## 2017-04-02 ENCOUNTER — Ambulatory Visit: Payer: Medicare Other | Admitting: Podiatry

## 2017-04-02 ENCOUNTER — Encounter: Payer: Self-pay | Admitting: Podiatry

## 2017-04-02 DIAGNOSIS — M79609 Pain in unspecified limb: Secondary | ICD-10-CM

## 2017-04-02 DIAGNOSIS — B351 Tinea unguium: Secondary | ICD-10-CM | POA: Diagnosis not present

## 2017-04-02 DIAGNOSIS — E119 Type 2 diabetes mellitus without complications: Secondary | ICD-10-CM

## 2017-04-02 DIAGNOSIS — I10 Essential (primary) hypertension: Secondary | ICD-10-CM | POA: Diagnosis not present

## 2017-04-02 DIAGNOSIS — E118 Type 2 diabetes mellitus with unspecified complications: Secondary | ICD-10-CM | POA: Diagnosis not present

## 2017-04-02 NOTE — Progress Notes (Signed)
Subjective:     Patient ID: Ricky [name]., male   DOB: 12/26/1940, 77 y.o.   MRN: [id number]  HPIThis patient presents to the office for preventive foot care services.  His nails have grown long thick and ingrown on second and third toes both feet.  He is diabetic with no complications   Review of Systems     Objective:   Physical Exam GENERAL APPEARANCE: Alert, conversant. Appropriately groomed. No acute distress.  VASCULAR: Pedal pulses palpable at  [facility] and PT bilateral.  Capillary refill time is immediate to all digits,  Normal temperature gradient.  Digital hair growth is present bilateral  NEUROLOGIC: sensation is normal to 5.07 monofilament at 5/5 sites bilateral.  Light touch is intact bilateral, Muscle strength normal.  MUSCULOSKELETAL: acceptable muscle strength, tone and stability bilateral.  Intrinsic muscluature intact bilateral.  Rectus appearance of foot and digits noted bilateral. Hallux malleus B/L  DERMATOLOGIC: skin color, texture, and turgor are within normal limits.  No preulcerative lesions or ulcers  are seen, no interdigital maceration noted.  No open lesions present.  . No drainage noted. Healing skin lesion right hallux.  Hammer toes  B/L NAILS  Thick disfigured discolored nails both feet with no drainage or infection.      Assessment:     Onychomycosis     Plan:     Debridement of Nails both feet.  RTC 10 weeks.     Gardiner Barefoot DPM

## 2017-04-09 DIAGNOSIS — N183 Chronic kidney disease, stage 3 (moderate): Secondary | ICD-10-CM | POA: Diagnosis not present

## 2017-04-09 DIAGNOSIS — I10 Essential (primary) hypertension: Secondary | ICD-10-CM | POA: Diagnosis not present

## 2017-04-09 DIAGNOSIS — E0822 Diabetes mellitus due to underlying condition with diabetic chronic kidney disease: Secondary | ICD-10-CM | POA: Diagnosis not present

## 2017-04-09 DIAGNOSIS — E785 Hyperlipidemia, unspecified: Secondary | ICD-10-CM | POA: Diagnosis not present

## 2017-04-22 DIAGNOSIS — E119 Type 2 diabetes mellitus without complications: Secondary | ICD-10-CM | POA: Diagnosis not present

## 2017-04-22 DIAGNOSIS — H524 Presbyopia: Secondary | ICD-10-CM | POA: Diagnosis not present

## 2017-04-22 DIAGNOSIS — H35033 Hypertensive retinopathy, bilateral: Secondary | ICD-10-CM | POA: Diagnosis not present

## 2017-05-02 DIAGNOSIS — M6281 Muscle weakness (generalized): Secondary | ICD-10-CM | POA: Diagnosis not present

## 2017-05-06 ENCOUNTER — Encounter (HOSPITAL_COMMUNITY): Payer: Self-pay | Admitting: Emergency Medicine

## 2017-05-06 ENCOUNTER — Emergency Department (HOSPITAL_COMMUNITY): Payer: Medicare Other

## 2017-05-06 ENCOUNTER — Inpatient Hospital Stay (HOSPITAL_COMMUNITY)
Admission: EM | Admit: 2017-05-06 | Discharge: 2017-05-09 | DRG: 871 | Disposition: A | Discharge status: Skilled Nursing Facility | Payer: Medicare Other | Attending: Internal Medicine | Admitting: Internal Medicine

## 2017-05-06 ENCOUNTER — Other Ambulatory Visit: Payer: Self-pay

## 2017-05-06 DIAGNOSIS — L89322 Pressure ulcer of left buttock, stage 2: Secondary | ICD-10-CM

## 2017-05-06 DIAGNOSIS — R7989 Other specified abnormal findings of blood chemistry: Secondary | ICD-10-CM | POA: Diagnosis not present

## 2017-05-06 DIAGNOSIS — F329 Major depressive disorder, single episode, unspecified: Secondary | ICD-10-CM | POA: Diagnosis present

## 2017-05-06 DIAGNOSIS — Z794 Long term (current) use of insulin: Secondary | ICD-10-CM | POA: Diagnosis not present

## 2017-05-06 DIAGNOSIS — R339 Retention of urine, unspecified: Secondary | ICD-10-CM | POA: Diagnosis not present

## 2017-05-06 DIAGNOSIS — R6520 Severe sepsis without septic shock: Secondary | ICD-10-CM

## 2017-05-06 DIAGNOSIS — F0390 Unspecified dementia without behavioral disturbance: Secondary | ICD-10-CM | POA: Diagnosis present

## 2017-05-06 DIAGNOSIS — I10 Essential (primary) hypertension: Secondary | ICD-10-CM | POA: Diagnosis not present

## 2017-05-06 DIAGNOSIS — J9811 Atelectasis: Secondary | ICD-10-CM | POA: Diagnosis not present

## 2017-05-06 DIAGNOSIS — N182 Chronic kidney disease, stage 2 (mild): Secondary | ICD-10-CM | POA: Diagnosis present

## 2017-05-06 DIAGNOSIS — N179 Acute kidney failure, unspecified: Secondary | ICD-10-CM

## 2017-05-06 DIAGNOSIS — Z66 Do not resuscitate: Secondary | ICD-10-CM | POA: Diagnosis not present

## 2017-05-06 DIAGNOSIS — K760 Fatty (change of) liver, not elsewhere classified: Secondary | ICD-10-CM | POA: Diagnosis present

## 2017-05-06 DIAGNOSIS — A419 Sepsis, unspecified organism: Principal | ICD-10-CM | POA: Diagnosis present

## 2017-05-06 DIAGNOSIS — E119 Type 2 diabetes mellitus without complications: Secondary | ICD-10-CM | POA: Diagnosis not present

## 2017-05-06 DIAGNOSIS — R569 Unspecified convulsions: Secondary | ICD-10-CM | POA: Diagnosis not present

## 2017-05-06 DIAGNOSIS — E785 Hyperlipidemia, unspecified: Secondary | ICD-10-CM | POA: Diagnosis present

## 2017-05-06 DIAGNOSIS — I129 Hypertensive chronic kidney disease with stage 1 through stage 4 chronic kidney disease, or unspecified chronic kidney disease: Secondary | ICD-10-CM | POA: Diagnosis present

## 2017-05-06 DIAGNOSIS — D72829 Elevated white blood cell count, unspecified: Secondary | ICD-10-CM | POA: Diagnosis not present

## 2017-05-06 DIAGNOSIS — Z79899 Other long term (current) drug therapy: Secondary | ICD-10-CM

## 2017-05-06 DIAGNOSIS — J9600 Acute respiratory failure, unspecified whether with hypoxia or hypercapnia: Secondary | ICD-10-CM | POA: Diagnosis present

## 2017-05-06 DIAGNOSIS — J690 Pneumonitis due to inhalation of food and vomit: Secondary | ICD-10-CM | POA: Diagnosis not present

## 2017-05-06 DIAGNOSIS — J189 Pneumonia, unspecified organism: Secondary | ICD-10-CM

## 2017-05-06 DIAGNOSIS — E1122 Type 2 diabetes mellitus with diabetic chronic kidney disease: Secondary | ICD-10-CM | POA: Diagnosis present

## 2017-05-06 DIAGNOSIS — N189 Chronic kidney disease, unspecified: Secondary | ICD-10-CM | POA: Diagnosis not present

## 2017-05-06 DIAGNOSIS — J9601 Acute respiratory failure with hypoxia: Secondary | ICD-10-CM | POA: Diagnosis not present

## 2017-05-06 DIAGNOSIS — F209 Schizophrenia, unspecified: Secondary | ICD-10-CM | POA: Diagnosis present

## 2017-05-06 DIAGNOSIS — R0602 Shortness of breath: Secondary | ICD-10-CM | POA: Diagnosis not present

## 2017-05-06 LAB — URINALYSIS, ROUTINE W REFLEX MICROSCOPIC
Bilirubin Urine: NEGATIVE
Glucose, UA: >=500 mg/dL — AB
Hgb urine dipstick: NEGATIVE
KETONES UR: NEGATIVE mg/dL
LEUKOCYTES UA: NEGATIVE
Nitrite: NEGATIVE
PROTEIN: NEGATIVE mg/dL
SQUAMOUS EPITHELIAL / LPF: NONE SEEN
Specific Gravity, Urine: 1.016 (ref 1.005–1.030)
pH: 5 (ref 5.0–8.0)

## 2017-05-06 LAB — COMPREHENSIVE METABOLIC PANEL
ALT: 20 U/L (ref 17–63)
ANION GAP: 14 (ref 5–15)
AST: 26 U/L (ref 15–41)
Albumin: 3.9 g/dL (ref 3.5–5.0)
Alkaline Phosphatase: 99 U/L (ref 38–126)
BUN: 20 mg/dL (ref 6–20)
CHLORIDE: 100 mmol/L — AB (ref 101–111)
CO2: 25 mmol/L (ref 22–32)
Calcium: 9.6 mg/dL (ref 8.9–10.3)
Creatinine, Ser: 1.5 mg/dL — ABNORMAL HIGH (ref 0.61–1.24)
GFR calc Af Amer: 50 mL/min — ABNORMAL LOW (ref >60)
GFR calc non Af Amer: 43 mL/min — ABNORMAL LOW (ref >60)
GLUCOSE: 241 mg/dL — AB (ref 65–99)
POTASSIUM: 3.3 mmol/L — AB (ref 3.5–5.1)
SODIUM: 139 mmol/L (ref 135–145)
Total Bilirubin: 0.7 mg/dL (ref 0.3–1.2)
Total Protein: 7.6 g/dL (ref 6.5–8.1)

## 2017-05-06 LAB — CBG MONITORING, ED
GLUCOSE-CAPILLARY: 163 mg/dL — AB (ref 65–99)
Glucose-Capillary: 142 mg/dL — ABNORMAL HIGH (ref 65–99)

## 2017-05-06 LAB — CBC WITH DIFFERENTIAL/PLATELET
Basophils Absolute: 0 10*3/uL (ref 0.0–0.1)
Basophils Relative: 0 %
Eosinophils Absolute: 0 10*3/uL (ref 0.0–0.7)
Eosinophils Relative: 0 %
HEMATOCRIT: 43.3 % (ref 39.0–52.0)
Hemoglobin: 14.3 g/dL (ref 13.0–17.0)
LYMPHS PCT: 3 %
Lymphs Abs: 0.5 10*3/uL — ABNORMAL LOW (ref 0.7–4.0)
MCH: 30.1 pg (ref 26.0–34.0)
MCHC: 33 g/dL (ref 30.0–36.0)
MCV: 91.2 fL (ref 78.0–100.0)
MONO ABS: 1.2 10*3/uL — AB (ref 0.1–1.0)
MONOS PCT: 7 %
NEUTROS ABS: 16.6 10*3/uL — AB (ref 1.7–7.7)
Neutrophils Relative %: 90 %
Platelets: 211 10*3/uL (ref 150–400)
RBC: 4.75 MIL/uL (ref 4.22–5.81)
RDW: 14 % (ref 11.5–15.5)
WBC: 18.3 10*3/uL — ABNORMAL HIGH (ref 4.0–10.5)

## 2017-05-06 LAB — I-STAT CG4 LACTIC ACID, ED: LACTIC ACID, VENOUS: 4.57 mmol/L — AB (ref 0.5–1.9)

## 2017-05-06 LAB — HEMOGLOBIN A1C
Hgb A1c MFr Bld: 7.6 % — ABNORMAL HIGH (ref 4.8–5.6)
Mean Plasma Glucose: 171.42 mg/dL

## 2017-05-06 LAB — LACTIC ACID, PLASMA: Lactic Acid, Venous: 2.3 mmol/L (ref 0.5–1.9)

## 2017-05-06 LAB — PROTIME-INR
INR: 1.02
Prothrombin Time: 13.3 seconds (ref 11.4–15.2)

## 2017-05-06 LAB — GLUCOSE, CAPILLARY: Glucose-Capillary: 142 mg/dL — ABNORMAL HIGH (ref 65–99)

## 2017-05-06 LAB — APTT: aPTT: 24 seconds (ref 24–36)

## 2017-05-06 LAB — PROCALCITONIN: PROCALCITONIN: 6.62 ng/mL

## 2017-05-06 MED ORDER — SODIUM CHLORIDE 0.9 % IV SOLN
INTRAVENOUS | Status: DC
  Administered 2017-05-06: 22:00:00 via INTRAVENOUS
  Administered 2017-05-07: 1000 mL via INTRAVENOUS

## 2017-05-06 MED ORDER — SODIUM CHLORIDE 0.9 % IV BOLUS
1000.0000 mL | Freq: Once | INTRAVENOUS | Status: AC
  Administered 2017-05-06: 1000 mL via INTRAVENOUS

## 2017-05-06 MED ORDER — PRAVASTATIN SODIUM 40 MG PO TABS
40.0000 mg | ORAL_TABLET | Freq: Every day | ORAL | Status: DC
  Administered 2017-05-06 – 2017-05-09 (×4): 40 mg via ORAL
  Filled 2017-05-06 (×4): qty 1

## 2017-05-06 MED ORDER — SODIUM CHLORIDE 0.9 % IV BOLUS (SEPSIS)
500.0000 mL | Freq: Once | INTRAVENOUS | Status: AC
  Administered 2017-05-06: 500 mL via INTRAVENOUS

## 2017-05-06 MED ORDER — PIPERACILLIN-TAZOBACTAM 3.375 G IVPB
3.3750 g | Freq: Three times a day (TID) | INTRAVENOUS | Status: DC
  Administered 2017-05-06 – 2017-05-09 (×9): 3.375 g via INTRAVENOUS
  Filled 2017-05-06 (×11): qty 50

## 2017-05-06 MED ORDER — SODIUM CHLORIDE 0.9 % IV BOLUS (SEPSIS)
1000.0000 mL | Freq: Once | INTRAVENOUS | Status: AC
  Administered 2017-05-06: 1000 mL via INTRAVENOUS

## 2017-05-06 MED ORDER — PANTOPRAZOLE SODIUM 40 MG PO TBEC
40.0000 mg | DELAYED_RELEASE_TABLET | Freq: Every day | ORAL | Status: DC
  Administered 2017-05-06 – 2017-05-09 (×4): 40 mg via ORAL
  Filled 2017-05-06 (×4): qty 1

## 2017-05-06 MED ORDER — PIPERACILLIN-TAZOBACTAM 3.375 G IVPB 30 MIN
3.3750 g | Freq: Once | INTRAVENOUS | Status: AC
  Administered 2017-05-06: 3.375 g via INTRAVENOUS
  Filled 2017-05-06: qty 50

## 2017-05-06 MED ORDER — INSULIN ASPART 100 UNIT/ML ~~LOC~~ SOLN
0.0000 [IU] | Freq: Three times a day (TID) | SUBCUTANEOUS | Status: DC
  Administered 2017-05-06: 2 [IU] via SUBCUTANEOUS
  Administered 2017-05-07: 3 [IU] via SUBCUTANEOUS
  Administered 2017-05-07: 2 [IU] via SUBCUTANEOUS
  Administered 2017-05-08 – 2017-05-09 (×3): 3 [IU] via SUBCUTANEOUS
  Administered 2017-05-09: 2 [IU] via SUBCUTANEOUS
  Filled 2017-05-06: qty 1

## 2017-05-06 MED ORDER — VANCOMYCIN HCL IN DEXTROSE 1-5 GM/200ML-% IV SOLN
1000.0000 mg | Freq: Two times a day (BID) | INTRAVENOUS | Status: DC
  Administered 2017-05-07: 1000 mg via INTRAVENOUS
  Filled 2017-05-06 (×2): qty 200

## 2017-05-06 MED ORDER — ENOXAPARIN SODIUM 40 MG/0.4ML ~~LOC~~ SOLN
40.0000 mg | SUBCUTANEOUS | Status: DC
  Administered 2017-05-06 – 2017-05-09 (×4): 40 mg via SUBCUTANEOUS
  Filled 2017-05-06 (×4): qty 0.4

## 2017-05-06 MED ORDER — INSULIN ASPART 100 UNIT/ML ~~LOC~~ SOLN: 0.0000 [IU] | Freq: Every day | SUBCUTANEOUS | Status: DC

## 2017-05-06 MED ORDER — INSULIN DETEMIR 100 UNIT/ML ~~LOC~~ SOLN
20.0000 [IU] | Freq: Every day | SUBCUTANEOUS | Status: DC
  Administered 2017-05-06 – 2017-05-08 (×3): 20 [IU] via SUBCUTANEOUS
  Filled 2017-05-06 (×4): qty 0.2

## 2017-05-06 MED ORDER — ALPRAZOLAM 0.5 MG PO TABS: 0.5000 mg | ORAL_TABLET | Freq: Two times a day (BID) | ORAL | Status: DC | PRN

## 2017-05-06 MED ORDER — POTASSIUM CHLORIDE CRYS ER 20 MEQ PO TBCR
40.0000 meq | EXTENDED_RELEASE_TABLET | Freq: Two times a day (BID) | ORAL | Status: AC
  Administered 2017-05-06 – 2017-05-07 (×2): 40 meq via ORAL
  Filled 2017-05-06 (×2): qty 2

## 2017-05-06 MED ORDER — LORAZEPAM 2 MG/ML IJ SOLN: 1.0000 mg | INTRAMUSCULAR | Status: DC | PRN

## 2017-05-06 MED ORDER — SODIUM CHLORIDE 0.9 % IV SOLN
2000.0000 mg | Freq: Once | INTRAVENOUS | Status: AC
  Administered 2017-05-06: 2000 mg via INTRAVENOUS
  Filled 2017-05-06: qty 2000

## 2017-05-06 MED ORDER — VANCOMYCIN HCL IN DEXTROSE 1-5 GM/200ML-% IV SOLN: 1000.0000 mg | Freq: Once | INTRAVENOUS | Status: DC

## 2017-05-06 NOTE — ED Notes (Signed)
1 set of blood cultures drawn from R. Forearm. Light green top, dark green top, blue top, and lavender top also collected. Dark green top in mini-lab. Remaining blood work is in pt's room on side table.

## 2017-05-06 NOTE — Progress Notes (Signed)
Pharmacy Antibiotic Note  Ricky [name]. is a 77 y.o. male admitted on 05/06/2017 with seizures. Pt is DNR. Also starting empiric abx for possible sepsis. LA 4.5, WBC 18.3, SCr 1.3.   Plan: -Vancomycin 2 g IV x1 then 1g/12h -Zosyn 3.375 g IV qih -Monitor renal fx, cultures, VT as needed   Height: 5\' 9"  (175.3 cm) Weight: 220 lb 7.4 oz (100 kg) IBW/kg (Calculated) : 70.7  Temp (24hrs), Avg:101 F (38.3 C), Min:101 F (38.3 C), Max:101 F (38.3 C)  Recent Labs  Lab 05/06/17 1431  LATICACIDVEN 4.57*     Allergies  Allergen Reactions  . Asa [Aspirin]     "Dr told me not to take it"  . Codeine Other (See Comments)    DIZZINESS with Tylenol 3  . Tylenol [Acetaminophen] Other (See Comments)    Dizziness with tylenol 3    Antimicrobials this admission: 4/16 vancomycin > 4/16 zosyn >  Dose adjustments this admission: N/A  Microbiology results: 4/16 blood cx: 4/16 urine cx:   Harvel Quale 05/06/2017 2:40 PM

## 2017-05-06 NOTE — ED Notes (Signed)
Both sets of blood cultures collected prior to start of antibiotic.

## 2017-05-06 NOTE — ED Triage Notes (Signed)
Patient arrived to ED from [facility]. EMS reports:  Staff called EMS reporting witnessed seizure while patient on toilet. Had slumped forward. Staff reported seizure lasting 3-4 minutes. No history of seizure activity.  EMS reports heartrate 150s. Rales/rhonchi bilateral lungs. Patient diaphoretic. SBP 180 dropped to 90s.. Pulse ox dropped to 80s. Placed on CPAP. 95% on CPAP.

## 2017-05-06 NOTE — ED Notes (Signed)
Changed sheet and chuck for pt, tolerated well.

## 2017-05-06 NOTE — ED Provider Notes (Signed)
[facility] EMERGENCY DEPARTMENT Provider Note   CSN: [id number] Arrival date & time: 05/06/17  1346     History   Chief Complaint Chief Complaint  Patient presents with  . Respiratory Distress  . Seizures    HPI Ricky [name]. is a 77 y.o. male.  Patient with fever, generalized weakness, and increased wob today. Is from [facility]. Pt is very limited historian - hx dementia - level 5 caveat.  Pt denies headache. No chest pain. Occasional non prod cough. No abd pain or nvd. No dysuria. Ems placed on cpap.   The history is provided by the patient, a caregiver and the EMS personnel. The history is limited by the condition of the patient.    Past Medical History:  Diagnosis Date  . Abnormality of gait   . Adenomatous colon polyp   . Arthritis   . Cataract   . Dementia   . Depression   . Diabetes mellitus without complication ([place])   . Fatty liver   . Hyperlipidemia   . Hypertension   . Internal hemorrhoids   . Other malaise and fatigue   . Peripheral edema   . Schizophrenia ([place])   . Type II or unspecified type diabetes mellitus without mention of complication, not stated as uncontrolled   . Urinary frequency   . Urinary retention     Patient Active Problem List   Diagnosis Date Noted  . Food impaction of esophagus   . Esophageal stricture   . Hiatal hernia   . Urinary retention 12/10/2012  . Rhabdomyolysis 12/10/2012  . Falls 12/10/2012  . First degree AV block 10/20/2012  . Extremity muscle atrophy 10/20/2012  . Lumbosacral plexopathy 07/20/2012  . Diabetes mellitus without complication ([place])   . Hypertension   . Depression   . Arthritis   . Hyperlipidemia   . Cataract   . Urinary frequency   . Fatty liver   . Schizophrenia ([place])   . Peripheral edema   . Internal hemorrhoids   . Adenomatous colon polyp   . Type II or unspecified type diabetes mellitus without mention of complication, not stated as uncontrolled   . Type II or  unspecified type diabetes mellitus with neurological manifestations, not stated as uncontrolled(250.60) 03/20/2012  . Abnormality of gait 03/20/2012  . Other malaise and fatigue 03/20/2012    Past Surgical History:  Procedure Laterality Date  . BALLOON DILATION N/A 05/22/2012   Procedure: BALLOON DILATION;  Surgeon: Beryle Beams, MD;  Location: [facility];  Service: Endoscopy;  Laterality: N/A;  . BALLOON DILATION N/A 06/09/2015   Procedure: BALLOON DILATION;  Surgeon: Carol Ada, MD;  Location: [facility];  Service: Endoscopy;  Laterality: N/A;  . ESOPHAGOGASTRODUODENOSCOPY  02/21/2012   Procedure: ESOPHAGOGASTRODUODENOSCOPY (EGD);  Surgeon: Beryle Beams, MD;  Location: [facility];  Service: Endoscopy;  Laterality: N/A;  . ESOPHAGOGASTRODUODENOSCOPY N/A 05/22/2012   Procedure: ESOPHAGOGASTRODUODENOSCOPY (EGD);  Surgeon: Beryle Beams, MD;  Location: [facility];  Service: Endoscopy;  Laterality: N/A;  . ESOPHAGOGASTRODUODENOSCOPY N/A 06/17/2014   Procedure: ESOPHAGOGASTRODUODENOSCOPY (EGD);  Surgeon: Carol Ada, MD;  Location: [facility];  Service: Endoscopy;  Laterality: N/A;  . ESOPHAGOGASTRODUODENOSCOPY N/A 08/10/2014   Procedure: ESOPHAGOGASTRODUODENOSCOPY (EGD);  Surgeon: Carol Ada, MD;  Location: [facility];  Service: Endoscopy;  Laterality: N/A;  . ESOPHAGOGASTRODUODENOSCOPY N/A 05/28/2015   Procedure: ESOPHAGOGASTRODUODENOSCOPY (EGD);  Surgeon: Gatha Mayer, MD;  Location: [facility];  Service: Endoscopy;  Laterality: N/A;  . ESOPHAGOGASTRODUODENOSCOPY (EGD) WITH PROPOFOL N/A  06/09/2015   Procedure: ESOPHAGOGASTRODUODENOSCOPY (EGD) WITH PROPOFOL;  Surgeon: Carol Ada, MD;  Location: [facility];  Service: Endoscopy;  Laterality: N/A;  . SAVORY DILATION N/A 06/17/2014   Procedure: SAVORY DILATION;  Surgeon: Carol Ada, MD;  Location: [facility];  Service: Endoscopy;  Laterality: N/A;  . TONSILLECTOMY          Home Medications    Prior to Admission  medications   Medication Sig Start Date End Date Taking? Authorizing Provider  ALPRAZolam Duanne Moron) 0.5 MG tablet Take 0.5 mg by mouth at bedtime.    [provider]  Amino Acids-Protein Hydrolys (FEEDING SUPPLEMENT, PRO-STAT SUGAR FREE 64,) LIQD Take 30 mLs by mouth 2 (two) times daily with a meal.    [provider]  amLODipine (NORVASC) 5 MG tablet Take 5 mg by mouth daily. 09/04/12   [provider]  Calcium Citrate-Vitamin D (CITRACAL + D PO) Take 1 tablet by mouth daily.    [provider]  chlorhexidine (PERIDEX) 0.12 % solution Use as directed 15 mLs in the mouth or throat at bedtime.     [provider]  Cholecalciferol (VITAMIN D-3) 1000 units CAPS Take 1 capsule by mouth daily.    [provider]  empagliflozin (JARDIANCE) 10 MG TABS tablet Take 10 mg by mouth daily.    [provider]  fluvoxaMINE (LUVOX) 100 MG tablet Take 100 mg by mouth at bedtime.    [provider]  furosemide (LASIX) 20 MG tablet Take 20 mg by mouth daily before breakfast.  12/07/11   [provider]  hydrOXYzine (ATARAX/VISTARIL) 25 MG tablet Take 25 mg by mouth at bedtime as needed (sleep).    [provider]  insulin detemir (LEVEMIR) 100 UNIT/ML injection Inject 40 Units into the skin at bedtime.     [provider]  meloxicam (MOBIC) 15 MG tablet Take 15 mg by mouth daily.    [provider]  metFORMIN (GLUCOPHAGE) 500 MG tablet Take 500 mg by mouth 2 (two) times daily with a meal.    [provider]  Multiple Vitamins-Minerals (DECUBI-VITE PO) Take 1 tablet by mouth daily.    [provider]  MYRBETRIQ 25 MG TB24 tablet Take 25 mg by mouth daily.  10/05/16   [provider]  NOVOLOG FLEXPEN 100 UNIT/ML FlexPen Inject 0-9 Units into the skin 3 (three) times daily as needed for high blood sugar. 101-150=1 units  151-200=2 units 201-250=3 units 251-300=5 units  301-350=7  units >350=9 units Call MD FOR BS>400 or <60 400 > call MD 07/17/16   [provider]  OLANZapine (ZYPREXA) 20 MG tablet Take 20 mg by mouth at bedtime.    [provider]  omega-3 acid ethyl esters (LOVAZA) 1 G capsule Take 2 g by mouth daily.     [provider]  omeprazole (PRILOSEC) 40 MG capsule Take 40 mg by mouth 2 (two) times daily.  09/28/12   [provider]  ONGLYZA 5 MG TABS tablet Take 5 mg by mouth daily before breakfast.  12/06/11   [provider]  pravastatin (PRAVACHOL) 40 MG tablet Take 40 mg by mouth daily after lunch.     [provider]  Specialty Vitamins Products (PROSTATE) TABS Take 1 tablet by mouth 2 (two) times daily.    [provider]  tamsulosin (FLOMAX) 0.4 MG CAPS capsule Take 0.4 mg by mouth daily.  10/01/16   [provider]  traMADol (ULTRAM) 50 MG tablet Take 50  mg by mouth every 6 (six) hours as needed for moderate pain.    [provider]  zolpidem (AMBIEN) 5 MG tablet Take 5 mg by mouth at bedtime.  09/24/16   [provider]    Family History Family History  Problem Relation Age of Onset  . Brain cancer Father     Social History Social History   Tobacco Use  . Smoking status: Never Smoker  . Smokeless tobacco: Never Used  Substance Use Topics  . Alcohol use: No  . Drug use: No     Allergies   Asa [aspirin]; Codeine; and Tylenol [acetaminophen]   Review of Systems Review of Systems  Constitutional: Positive for fever.  HENT: Negative for sore throat.   Eyes: Negative for redness.  Respiratory: Positive for shortness of breath.   Cardiovascular: Negative for chest pain.  Gastrointestinal: Negative for abdominal pain.  Genitourinary: Negative for flank pain.  Musculoskeletal: Negative for neck pain and neck stiffness.  Skin: Negative for rash.  Neurological: Negative for headaches.  Hematological: Does not bruise/bleed easily.   Psychiatric/Behavioral: Negative for confusion.     Physical Exam Updated Vital Signs BP (!) 141/110   Pulse (!) 144   Temp (!) 101 F (38.3 C) (Axillary) Comment (Src): Used axillary - cpap in use  Resp (!) 29   SpO2 98% Comment: CPAP  Physical Exam  Constitutional: He appears well-developed and well-nourished. No distress.  HENT:  Mouth/Throat: Oropharynx is clear and moist.  Eyes: Conjunctivae are normal.  Neck: Neck supple. No tracheal deviation present.  No stiffness or rigidity  Cardiovascular: Normal heart sounds and intact distal pulses. Exam reveals no gallop and no friction rub.  No murmur heard. Tachycardic   Pulmonary/Chest: No accessory muscle usage. He is in respiratory distress.  Upper resp congestion.   Abdominal: Soft. Bowel sounds are normal. He exhibits no distension. There is no tenderness.  Genitourinary:  Genitourinary Comments: No cva tenderness  Musculoskeletal: He exhibits no edema.  Neurological: He is alert.  Skin: Skin is warm and dry. No rash noted. He is not diaphoretic.  Psychiatric: He has a normal mood and affect.  Nursing note and vitals reviewed.    ED Treatments / Results  Labs (all labs ordered are listed, but only abnormal results are displayed) Results for orders placed or performed during the hospital encounter of 05/06/17  Comprehensive metabolic panel  Result Value Ref Range   Sodium 139 135 - 145 mmol/L   Potassium 3.3 (L) 3.5 - 5.1 mmol/L   Chloride 100 (L) 101 - 111 mmol/L   CO2 25 22 - 32 mmol/L   Glucose, Bld 241 (H) 65 - 99 mg/dL   BUN 20 6 - 20 mg/dL   Creatinine, Ser 1.50 (H) 0.61 - 1.24 mg/dL   Calcium 9.6 8.9 - 10.3 mg/dL   Total Protein 7.6 6.5 - 8.1 g/dL   Albumin 3.9 3.5 - 5.0 g/dL   AST 26 15 - 41 U/L   ALT 20 17 - 63 U/L   Alkaline Phosphatase 99 38 - 126 U/L   Total Bilirubin 0.7 0.3 - 1.2 mg/dL   GFR calc non Af Amer 43 (L) >60 mL/min   GFR calc Af Amer 50 (L) >60 mL/min   Anion gap 14 5 - 15  CBC  WITH DIFFERENTIAL  Result Value Ref Range   WBC 18.3 (H) 4.0 - 10.5 K/uL   RBC 4.75 4.22 - 5.81 MIL/uL   Hemoglobin 14.3 13.0 - 17.0 g/dL  HCT 43.3 39.0 - 52.0 %   MCV 91.2 78.0 - 100.0 fL   MCH 30.1 26.0 - 34.0 pg   MCHC 33.0 30.0 - 36.0 g/dL   RDW 14.0 11.5 - 15.5 %   Platelets 211 150 - 400 K/uL   Neutrophils Relative % 90 %   Neutro Abs 16.6 (H) 1.7 - 7.7 K/uL   Lymphocytes Relative 3 %   Lymphs Abs 0.5 (L) 0.7 - 4.0 K/uL   Monocytes Relative 7 %   Monocytes Absolute 1.2 (H) 0.1 - 1.0 K/uL   Eosinophils Relative 0 %   Eosinophils Absolute 0.0 0.0 - 0.7 K/uL   Basophils Relative 0 %   Basophils Absolute 0.0 0.0 - 0.1 K/uL  I-Stat CG4 Lactic Acid, ED  (not at  [facility])  Result Value Ref Range   Lactic Acid, Venous 4.57 (HH) 0.5 - 1.9 mmol/L   Comment NOTIFIED PHYSICIAN    Dg Chest Port 1 View  Result Date: 05/06/2017 CLINICAL DATA:  77 year old male post seizure.  Initial encounter. EXAM: PORTABLE CHEST 1 VIEW COMPARISON:  09/17/2016 chest x-ray. FINDINGS: Poor inspiration. Crowding lung markings left lung base may represent atelectasis. Cannot exclude left lower lobe infiltrate. Heart size top-normal. Calcified mildly tortuous aorta. No pneumothorax. Bilateral shoulder joint degenerative changes. No acute fracture noted. IMPRESSION: Crowding of lung markings left lung base may represent atelectasis. Cannot exclude left lower lobe infiltrate. Aortic Atherosclerosis (ICD10-I70.0). Electronically Signed   By: Genia Del M.D.   On: 05/06/2017 14:38    EKG EKG Interpretation  Date/Time:  Tuesday May 06 2017 13:47:22 EDT Ventricular Rate:  142 PR Interval:    QRS Duration: 101 QT Interval:  333 QTC Calculation: 512 R Axis:   97 Text Interpretation:  Narrow QRS tachycardia Non-specific ST-t changes Confirmed by Lajean Saver [phone]) on 05/06/2017 2:02:31 PM   Radiology Dg Chest Port 1 View  Result Date: 05/06/2017 CLINICAL DATA:  77 year old male post seizure.  Initial  encounter. EXAM: PORTABLE CHEST 1 VIEW COMPARISON:  09/17/2016 chest x-ray. FINDINGS: Poor inspiration. Crowding lung markings left lung base may represent atelectasis. Cannot exclude left lower lobe infiltrate. Heart size top-normal. Calcified mildly tortuous aorta. No pneumothorax. Bilateral shoulder joint degenerative changes. No acute fracture noted. IMPRESSION: Crowding of lung markings left lung base may represent atelectasis. Cannot exclude left lower lobe infiltrate. Aortic Atherosclerosis (ICD10-I70.0). Electronically Signed   By: Genia Del M.D.   On: 05/06/2017 14:38    Procedures Procedures (including critical care time)  Medications Ordered in ED Medications - No data to display   Initial Impression / Assessment and Plan / ED Course  I have reviewed the triage vital signs and the nursing notes.  Pertinent labs & imaging results that were available during my care of the patient were reviewed by me and considered in my medical decision making (see chart for details).  Iv ns. Labs. Cultures. Cxr.   Reviewed nursing notes and prior charts for additional history.   Patient has DNR - no intubation/cpr.  cxr reviewed - infiltrate c/w pna.   Iv abx after cultures sent.  Recheck bp lower and lactate very high. 30 cc/kg bolus ns.   Bipap  - resp therapy consulted - titrate/adjust bipap.  Reassess bp, mildly improved.  hospitalists consulted for admit to stepdown.   CRITICAL CARE  RE acute respiratory failure, hcap, hypoxia, elevated lactate, sepsis.  Performed by: Mirna Mires Total critical care time: 45 minutes Critical care time was exclusive of separately billable  procedures and treating other patients. Critical care was necessary to treat or prevent imminent or life-threatening deterioration. Critical care was time spent personally by me on the following activities: development of treatment plan with patient and/or surrogate as well as nursing, discussions with  consultants, evaluation of patient's response to treatment, examination of patient, obtaining history from patient or surrogate, ordering and performing treatments and interventions, ordering and review of laboratory studies, ordering and review of radiographic studies, pulse oximetry and re-evaluation of patient's condition.   Final Clinical Impressions(s) / ED Diagnoses   Final diagnoses:  None    ED Discharge Orders    None       Lajean Saver, MD 05/06/17 1535

## 2017-05-06 NOTE — ED Notes (Signed)
Portable chest x-ray at bedside at this time. 

## 2017-05-06 NOTE — H&P (Signed)
History and Physical    Ricky [name]. NTI:[id number] DOB: 03/23/1940 DOA: 05/06/2017  PCP: Deland Pretty, MD Patient coming from: assisted living  Chief Complaint: respiratory failure/seizure/fever  HPI: Ricky [name]. is a delightful 77 y.o. male with medical history significant for  Diabetes, hypertension, schizophrenia,since to the emergency Department chief complaint respiratory failure and new onset seizure. Initial evaluation reveals sepsis likely related to pneumonia. [facility] are asked to admit  Information is obtained from the chart, staff at facility and the patient noting that information from patient may be unreliable do to acute illness. He also has a caregiver at the bedside who assists him at home 5 days a week.  Reportedly EMS was called to facility after patient experienced a witnessed seizure.taff at facility report patient was on the toilet forward and was demonstrating seizure activity. Reportedly activity lasted 3-4 minutes. No reported history of seizure activity. EMS reports heart rate 150 with coarse breath sounds and hypoxia. Also reported over the last couple of days patient had intermittent fever and persistent generalized weakness with decreased oral intake. No report of any chest pain palpitations shortness of breath or cough. No reports of abdominal pain nausea vomiting diarrhea constipation. No dysuria hematuria frequency or urgency. Patient was placed on C Pap per EMS and transported to the hospital  ED Course: in the emergency department he is transition to BiPAP is in respiratory distress, max temperature 101 axillary, hypotension, tachycardia tachypnea hypoxic  Is provided with vigorous IV fluids and IV antibiotics and bipap. At time of admission respirations with no increased work of breathing and saturation level 100% on bipap  Review of Systems: As per HPI otherwise all other systems reviewed and are negative.   Ambulatory Status:  wheelchair bound. Full assist with adl  Past Medical History:  Diagnosis Date  . Abnormality of gait   . Adenomatous colon polyp   . Arthritis   . Cataract   . Dementia   . Depression   . Diabetes mellitus without complication ([place])   . Fatty liver   . Hyperlipidemia   . Hypertension   . Internal hemorrhoids   . Other malaise and fatigue   . Peripheral edema   . Schizophrenia ([place])   . Type II or unspecified type diabetes mellitus without mention of complication, not stated as uncontrolled   . Urinary frequency   . Urinary retention     Past Surgical History:  Procedure Laterality Date  . BALLOON DILATION N/A 05/22/2012   Procedure: BALLOON DILATION;  Surgeon: Beryle Beams, MD;  Location: [facility];  Service: Endoscopy;  Laterality: N/A;  . BALLOON DILATION N/A 06/09/2015   Procedure: BALLOON DILATION;  Surgeon: Carol Ada, MD;  Location: [facility];  Service: Endoscopy;  Laterality: N/A;  . ESOPHAGOGASTRODUODENOSCOPY  02/21/2012   Procedure: ESOPHAGOGASTRODUODENOSCOPY (EGD);  Surgeon: Beryle Beams, MD;  Location: [facility];  Service: Endoscopy;  Laterality: N/A;  . ESOPHAGOGASTRODUODENOSCOPY N/A 05/22/2012   Procedure: ESOPHAGOGASTRODUODENOSCOPY (EGD);  Surgeon: Beryle Beams, MD;  Location: [facility];  Service: Endoscopy;  Laterality: N/A;  . ESOPHAGOGASTRODUODENOSCOPY N/A 06/17/2014   Procedure: ESOPHAGOGASTRODUODENOSCOPY (EGD);  Surgeon: Carol Ada, MD;  Location: [facility];  Service: Endoscopy;  Laterality: N/A;  . ESOPHAGOGASTRODUODENOSCOPY N/A 08/10/2014   Procedure: ESOPHAGOGASTRODUODENOSCOPY (EGD);  Surgeon: Carol Ada, MD;  Location: [facility];  Service: Endoscopy;  Laterality: N/A;  . ESOPHAGOGASTRODUODENOSCOPY N/A 05/28/2015   Procedure: ESOPHAGOGASTRODUODENOSCOPY (EGD);  Surgeon: Gatha Mayer, MD;  Location: [facility];  Service:  Endoscopy;  Laterality: N/A;  . ESOPHAGOGASTRODUODENOSCOPY (EGD) WITH PROPOFOL N/A 06/09/2015   Procedure:  ESOPHAGOGASTRODUODENOSCOPY (EGD) WITH PROPOFOL;  Surgeon: Carol Ada, MD;  Location: [facility];  Service: Endoscopy;  Laterality: N/A;  . SAVORY DILATION N/A 06/17/2014   Procedure: SAVORY DILATION;  Surgeon: Carol Ada, MD;  Location: [facility];  Service: Endoscopy;  Laterality: N/A;  . TONSILLECTOMY      Social History   Socioeconomic History  . Marital status: Single    Spouse name: Not on file  . Number of children: Not on file  . Years of education: college  . Highest education level: Not on file  Occupational History    Employer: RETIRED    Comment: Disabled  Social Needs  . Financial resource strain: Not on file  . Food insecurity:    Worry: Not on file    Inability: Not on file  . Transportation needs:    Medical: Not on file    Non-medical: Not on file  Tobacco Use  . Smoking status: Never Smoker  . Smokeless tobacco: Never Used  Substance and Sexual Activity  . Alcohol use: No  . Drug use: No  . Sexual activity: Not on file  Lifestyle  . Physical activity:    Days per week: Not on file    Minutes per session: Not on file  . Stress: Not on file  Relationships  . Social connections:    Talks on phone: Not on file    Gets together: Not on file    Attends religious service: Not on file    Active member of club or organization: Not on file    Attends meetings of clubs or organizations: Not on file    Relationship status: Not on file  . Intimate partner violence:    Fear of current or ex partner: Not on file    Emotionally abused: Not on file    Physically abused: Not on file    Forced sexual activity: Not on file  Other Topics Concern  . Not on file  Social History Narrative   Patient is disabled and he has not worked since 1962. Patient has some college education.Patient drinks 2 cups of coffee daily.   Right handed.    Allergies  Allergen Reactions  . Asa [Aspirin]     "Dr told me not to take it"  . Codeine Other (See Comments)     DIZZINESS with Tylenol 3  . Tylenol [Acetaminophen] Other (See Comments)    Dizziness with tylenol 3    Family History  Problem Relation Age of Onset  . Brain cancer Father     Prior to Admission medications   Medication Sig Start Date End Date Taking? Authorizing Provider  ALPRAZolam Duanne Moron) 0.5 MG tablet Take 0.5 mg by mouth at bedtime.    [provider]  Amino Acids-Protein Hydrolys (FEEDING SUPPLEMENT, PRO-STAT SUGAR FREE 64,) LIQD Take 30 mLs by mouth 2 (two) times daily with a meal.    [provider]  amLODipine (NORVASC) 5 MG tablet Take 5 mg by mouth daily. 09/04/12   [provider]  Calcium Citrate-Vitamin D (CITRACAL + D PO) Take 1 tablet by mouth daily.    [provider]  chlorhexidine (PERIDEX) 0.12 % solution Use as directed 15 mLs in the mouth or throat at bedtime.     [provider]  Cholecalciferol (VITAMIN D-3) 1000 units CAPS Take 1 capsule by mouth daily.    [provider]  empagliflozin (  JARDIANCE) 10 MG TABS tablet Take 10 mg by mouth daily.    [provider]  fluvoxaMINE (LUVOX) 100 MG tablet Take 100 mg by mouth at bedtime.    [provider]  furosemide (LASIX) 20 MG tablet Take 20 mg by mouth daily before breakfast.  12/07/11   [provider]  hydrOXYzine (ATARAX/VISTARIL) 25 MG tablet Take 25 mg by mouth at bedtime as needed (sleep).    [provider]  insulin detemir (LEVEMIR) 100 UNIT/ML injection Inject 40 Units into the skin at bedtime.     [provider]  meloxicam (MOBIC) 15 MG tablet Take 15 mg by mouth daily.    [provider]  metFORMIN (GLUCOPHAGE) 500 MG tablet Take 500 mg by mouth 2 (two) times daily with a meal.    [provider]  Multiple Vitamins-Minerals (DECUBI-VITE PO) Take 1 tablet by mouth daily.    [provider]  MYRBETRIQ 25 MG TB24 tablet Take 25 mg by mouth daily.  10/05/16   [provider]    NOVOLOG FLEXPEN 100 UNIT/ML FlexPen Inject 0-9 Units into the skin 3 (three) times daily as needed for high blood sugar. 101-150=1 units  151-200=2 units 201-250=3 units 251-300=5 units  301-350=7 units >350=9 units Call MD FOR BS>400 or <60 400 > call MD 07/17/16   [provider]  OLANZapine (ZYPREXA) 20 MG tablet Take 20 mg by mouth at bedtime.    [provider]  omega-3 acid ethyl esters (LOVAZA) 1 G capsule Take 2 g by mouth daily.     [provider]  omeprazole (PRILOSEC) 40 MG capsule Take 40 mg by mouth 2 (two) times daily.  09/28/12   [provider]  ONGLYZA 5 MG TABS tablet Take 5 mg by mouth daily before breakfast.  12/06/11   [provider]  pravastatin (PRAVACHOL) 40 MG tablet Take 40 mg by mouth daily after lunch.     [provider]  Specialty Vitamins Products (PROSTATE) TABS Take 1 tablet by mouth 2 (two) times daily.    [provider]  tamsulosin (FLOMAX) 0.4 MG CAPS capsule Take 0.4 mg by mouth daily.  10/01/16   [provider]  traMADol (ULTRAM) 50 MG tablet Take 50 mg by mouth every 6 (six) hours as needed for moderate pain.    [provider]  zolpidem (AMBIEN) 5 MG tablet Take 5 mg by mouth at bedtime.  09/24/16   [provider]    Physical Exam: Vitals:   05/06/17 1500 05/06/17 1530 05/06/17 1600 05/06/17 1615  BP: 109/65 105/73 123/72 127/76  Pulse: (!) 112 (!) 104 (!) 102 99  Resp: 20 16 19 15   Temp:      TempSrc:      SpO2: 98% 100% 100% 100%  Weight:      Height:         General:  Appears calm and comfortable on bipap no acute distress Eyes:  PERRL, EOMI, normal lids, iris ENT:  grossly normal hearing, lips & tongue,  Neck:  no LAD, masses or thyromegaly Cardiovascular:  egular rate and rhythm no murmur gallop or rub HS distant trace LE edema Respiratory:  mild increased work of breathing with conversation on BiPAP BS somewhat diminished throughout. Mild  scattered rhonchi Abdomen:  soft, ntnd,  Positive bowel sounds throughout Skin:  no rash or induration seen on limited exam Musculoskeletal:  grossly normal tone BUE/BLE, good ROM, no bony abnormality Psychiatric:  grossly normal mood  and affect, speech fluent and appropriate, AOx3 Neurologic:  CN 2-12 grossly intact, moves all extremities in coordinated fashion, sensation intact alert and oriented to self and place. Follows commands. Can make wants and needs known  Labs on Admission: I have personally reviewed following labs and imaging studies  CBC: Recent Labs  Lab 05/06/17 1411  WBC 18.3*  NEUTROABS 16.6*  HGB 14.3  HCT 43.3  MCV 91.2  PLT 242   Basic Metabolic Panel: Recent Labs  Lab 05/06/17 1411  NA 139  K 3.3*  CL 100*  CO2 25  GLUCOSE 241*  BUN 20  CREATININE 1.50*  CALCIUM 9.6   GFR: Estimated Creatinine Clearance: 48.1 mL/min (A) (by C-G formula based on SCr of 1.5 mg/dL (H)). Liver Function Tests: Recent Labs  Lab 05/06/17 1411  AST 26  ALT 20  ALKPHOS 99  BILITOT 0.7  PROT 7.6  ALBUMIN 3.9   No results for input(s): LIPASE, AMYLASE in the last 168 hours. No results for input(s): AMMONIA in the last 168 hours. Coagulation Profile: No results for input(s): INR, PROTIME in the last 168 hours. Cardiac Enzymes: No results for input(s): CKTOTAL, CKMB, CKMBINDEX, TROPONINI in the last 168 hours. BNP (last 3 results) No results for input(s): PROBNP in the last 8760 hours. HbA1C: Recent Labs    05/06/17 1411  HGBA1C 7.6*   CBG: Recent Labs  Lab 05/06/17 1657  GLUCAP 163*   Lipid Profile: No results for input(s): CHOL, HDL, LDLCALC, TRIG, CHOLHDL, LDLDIRECT in the last 72 hours. Thyroid Function Tests: No results for input(s): TSH, T4TOTAL, FREET4, T3FREE, THYROIDAB in the last 72 hours. Anemia Panel: No results for input(s): VITAMINB12, FOLATE, FERRITIN, TIBC, IRON, RETICCTPCT in the last 72 hours. Urine analysis:    Component Value  Date/Time   COLORURINE YELLOW 05/06/2017 1510   APPEARANCEUR CLEAR 05/06/2017 1510   LABSPEC 1.016 05/06/2017 1510   PHURINE 5.0 05/06/2017 1510   GLUCOSEU >=500 (A) 05/06/2017 1510   HGBUR NEGATIVE 05/06/2017 1510   BILIRUBINUR NEGATIVE 05/06/2017 1510   KETONESUR NEGATIVE 05/06/2017 1510   PROTEINUR NEGATIVE 05/06/2017 1510   UROBILINOGEN 0.2 12/10/2012 1322   NITRITE NEGATIVE 05/06/2017 1510   LEUKOCYTESUR NEGATIVE 05/06/2017 1510    Creatinine Clearance: Estimated Creatinine Clearance: 48.1 mL/min (A) (by C-G formula based on SCr of 1.5 mg/dL (H)).  Sepsis Labs: @LABRCNTIP (procalcitonin:4,lacticidven:4) )No results found for this or any previous visit (from the past 240 hour(s)).   Radiological Exams on Admission: Dg Chest Port 1 View  Result Date: 05/06/2017 CLINICAL DATA:  77 year old male post seizure.  Initial encounter. EXAM: PORTABLE CHEST 1 VIEW COMPARISON:  09/17/2016 chest x-ray. FINDINGS: Poor inspiration. Crowding lung markings left lung base may represent atelectasis. Cannot exclude left lower lobe infiltrate. Heart size top-normal. Calcified mildly tortuous aorta. No pneumothorax. Bilateral shoulder joint degenerative changes. No acute fracture noted. IMPRESSION: Crowding of lung markings left lung base may represent atelectasis. Cannot exclude left lower lobe infiltrate. Aortic Atherosclerosis (ICD10-I70.0). Electronically Signed   By: Genia Del M.D.   On: 05/06/2017 14:38    EKG: I  Assessment/Plan Principal Problem:   Acute respiratory failure (HCC) Active Problems:   Diabetes mellitus without complication (HCC)   Hypertension   Fatty liver   Schizophrenia ([place])   Urinary retention   HCAP (healthcare-associated pneumonia)   Sepsis ([place])   Seizure ([place])   CKD (chronic kidney disease), stage II   #1. Acute respiratory failure likely related to pneumonia. Oxygen duration level in the 80s on  room air per EMS. Chest x-ray with crowding of lung markings  left lung base concerning for infiltrate. Provided with antibiotics and BiPAP. At the time of admission patient being weaned off BiPAP with an oxygen saturation level of 100% and no increased work of breathing -Admit to telemetry -Wean off BiPAP -Oxygen supplementation as needed -Monitor oxygen saturation level  #2. Sepsis. Patient with leukocytosis elevated lactic acid tachypnea tachycardia. Related to above. Divided with a vigorous IV fluids -Continue IV fluids -track lactic acid -vigorous IV fluids -Follow blood cultures -Sputum culture as able -Pro calcitonin -Monitor  #3. Seizure. One episode witnessed by staff. No history of seizure activity. Be related to above. -Obtain an EEG -consider keppra And neuro consult depending on results -seizure precautions  #4. Chronic kidney disease. Stage II likely. Creatinine 1.5. Chart review indicates baseline close to 1.25.  -Hold nephrotoxins -IV fluids as noted above -Monitor urine output  5. Diabetes.serum glucose 241 on admission. Home medications include Levemir and sliding scale. -Hemoglobin A1c -levemir at a lower dose -Sliding scale for optimal control  #6. Hcap. hest x-ray with infiltrate concerning for pneumonia. Is a leukocytosis of fever tachycardia tachypnea respiratory failure -see #1 and #2 -follow blood cultures -Sputum cultures as able -Antibiotics per protocol -monitor  #7. Hypertension. Blood pressure somewhat soft at time of admission. He was hypotensive at presentation and improved with fluid resuscitation. Home medications include Lasix, amlodipine -We'll hold Lasix and amlodipine for now -monitor blood pressure closely and resume meds as indicated  #8. Urinary retention. I medications include flomax -continue flowmax     DVT prophylaxis: lovenox  Code Status: dnr  Family Communication: none  Disposition Plan: back to facility  Consults called: none  Admission status: inpatient    Radene Gunning  MD [facility]  If 7PM-7AM, please contact night-coverage [email or web address] Password [facility]  05/06/2017, 5:06 PM

## 2017-05-06 NOTE — Progress Notes (Signed)
Patient arrived by EMS on CPAP.  Took patient off of CPAP and placed on our Bipap machine.  Patient noted to be tachycardic with a heart rate of 140s.  Patient diminished throughout all lung fields.  Sats of 100% at this time.  Will continue to monitor.

## 2017-05-06 NOTE — Progress Notes (Signed)
Pt taken off bipap at this time and placed on 3L [place]. Pt denies SOB, no increased WOB, VS within normal limits. Clear/Diminished BS throughout. RT will continue to closely monitor for possible need for bipap again

## 2017-05-07 ENCOUNTER — Other Ambulatory Visit: Payer: Self-pay

## 2017-05-07 ENCOUNTER — Inpatient Hospital Stay (HOSPITAL_COMMUNITY): Payer: Medicare Other

## 2017-05-07 DIAGNOSIS — N179 Acute kidney failure, unspecified: Secondary | ICD-10-CM

## 2017-05-07 DIAGNOSIS — N189 Chronic kidney disease, unspecified: Secondary | ICD-10-CM

## 2017-05-07 DIAGNOSIS — A419 Sepsis, unspecified organism: Principal | ICD-10-CM

## 2017-05-07 DIAGNOSIS — J9601 Acute respiratory failure with hypoxia: Secondary | ICD-10-CM

## 2017-05-07 DIAGNOSIS — R7989 Other specified abnormal findings of blood chemistry: Secondary | ICD-10-CM

## 2017-05-07 DIAGNOSIS — R569 Unspecified convulsions: Secondary | ICD-10-CM

## 2017-05-07 DIAGNOSIS — J189 Pneumonia, unspecified organism: Secondary | ICD-10-CM

## 2017-05-07 LAB — COMPREHENSIVE METABOLIC PANEL
ALK PHOS: 68 U/L (ref 38–126)
ALT: 19 U/L (ref 17–63)
AST: 22 U/L (ref 15–41)
Albumin: 3.2 g/dL — ABNORMAL LOW (ref 3.5–5.0)
Anion gap: 13 (ref 5–15)
BUN: 15 mg/dL (ref 6–20)
CALCIUM: 8.6 mg/dL — AB (ref 8.9–10.3)
CHLORIDE: 104 mmol/L (ref 101–111)
CO2: 22 mmol/L (ref 22–32)
CREATININE: 1.09 mg/dL (ref 0.61–1.24)
Glucose, Bld: 135 mg/dL — ABNORMAL HIGH (ref 65–99)
Potassium: 3.9 mmol/L (ref 3.5–5.1)
Sodium: 139 mmol/L (ref 135–145)
TOTAL PROTEIN: 6.4 g/dL — AB (ref 6.5–8.1)
Total Bilirubin: 1.3 mg/dL — ABNORMAL HIGH (ref 0.3–1.2)

## 2017-05-07 LAB — CBC
HCT: 41 % (ref 39.0–52.0)
Hemoglobin: 13.8 g/dL (ref 13.0–17.0)
MCH: 31.2 pg (ref 26.0–34.0)
MCHC: 33.7 g/dL (ref 30.0–36.0)
MCV: 92.6 fL (ref 78.0–100.0)
PLATELETS: 169 10*3/uL (ref 150–400)
RBC: 4.43 MIL/uL (ref 4.22–5.81)
RDW: 14.8 % (ref 11.5–15.5)
WBC: 20.2 10*3/uL — AB (ref 4.0–10.5)

## 2017-05-07 LAB — GLUCOSE, CAPILLARY
GLUCOSE-CAPILLARY: 104 mg/dL — AB (ref 65–99)
GLUCOSE-CAPILLARY: 169 mg/dL — AB (ref 65–99)
GLUCOSE-CAPILLARY: 167 mg/dL — AB (ref 65–99)
Glucose-Capillary: 126 mg/dL — ABNORMAL HIGH (ref 65–99)

## 2017-05-07 LAB — MRSA PCR SCREENING: MRSA BY PCR: NEGATIVE

## 2017-05-07 LAB — PROCALCITONIN: Procalcitonin: 14.41 ng/mL

## 2017-05-07 MED ORDER — MIRABEGRON ER 25 MG PO TB24
25.0000 mg | ORAL_TABLET | Freq: Every day | ORAL | Status: DC
  Administered 2017-05-07 – 2017-05-09 (×3): 25 mg via ORAL
  Filled 2017-05-07 (×3): qty 1

## 2017-05-07 MED ORDER — TAMSULOSIN HCL 0.4 MG PO CAPS
0.4000 mg | ORAL_CAPSULE | Freq: Every day | ORAL | Status: DC
  Administered 2017-05-07 – 2017-05-09 (×3): 0.4 mg via ORAL
  Filled 2017-05-07 (×3): qty 1

## 2017-05-07 MED ORDER — VITAMIN D 1000 UNITS PO TABS
1000.0000 [IU] | ORAL_TABLET | Freq: Every day | ORAL | Status: DC
  Administered 2017-05-07 – 2017-05-09 (×3): 1000 [IU] via ORAL
  Filled 2017-05-07 (×3): qty 1

## 2017-05-07 MED ORDER — OMEGA-3-ACID ETHYL ESTERS 1 G PO CAPS
2.0000 g | ORAL_CAPSULE | Freq: Every day | ORAL | Status: DC
  Administered 2017-05-07 – 2017-05-09 (×3): 2 g via ORAL
  Filled 2017-05-07 (×3): qty 2

## 2017-05-07 MED ORDER — PRO-STAT SUGAR FREE PO LIQD
30.0000 mL | Freq: Two times a day (BID) | ORAL | Status: DC
  Administered 2017-05-07 – 2017-05-08 (×3): 30 mL via ORAL
  Filled 2017-05-07 (×4): qty 30

## 2017-05-07 MED ORDER — ALPRAZOLAM 0.5 MG PO TABS
0.5000 mg | ORAL_TABLET | Freq: Every day | ORAL | Status: DC
  Administered 2017-05-07 – 2017-05-08 (×2): 0.5 mg via ORAL
  Filled 2017-05-07 (×2): qty 1

## 2017-05-07 MED ORDER — DOCUSATE SODIUM 100 MG PO CAPS
100.0000 mg | ORAL_CAPSULE | Freq: Two times a day (BID) | ORAL | Status: DC
  Administered 2017-05-07 – 2017-05-09 (×5): 100 mg via ORAL
  Filled 2017-05-07 (×5): qty 1

## 2017-05-07 MED ORDER — FLUVOXAMINE MALEATE 100 MG PO TABS
100.0000 mg | ORAL_TABLET | Freq: Every day | ORAL | Status: DC
Start: 2017-05-07 — End: 2017-05-09
  Administered 2017-05-07 – 2017-05-08 (×2): 100 mg via ORAL
  Filled 2017-05-07 (×3): qty 1

## 2017-05-07 MED ORDER — OLANZAPINE 10 MG PO TABS
20.0000 mg | ORAL_TABLET | Freq: Every day | ORAL | Status: DC
  Administered 2017-05-07 – 2017-05-08 (×2): 20 mg via ORAL
  Filled 2017-05-07 (×3): qty 2

## 2017-05-07 NOTE — Evaluation (Signed)
Physical Therapy Evaluation Patient Details Name: Ricky [name]. MRN: [id number] DOB: [birth year] Today's Date: 05/07/2017   History of Present Illness  77 year old male, resident of [facility], wheelchair mobile per his report, PMH of dementia, depression, DM2, HLD, HTN, schizophrenia, presented to ED after staff at facility witnessed seizure which lasted 3-4 minutes.  Clinical Impression  Orders received for PT evaluation. Patient demonstrates deficits in functional mobility as indicated below. Will benefit from continued skilled PT to address deficits and maximize function. Will see as indicated and progress as tolerated.  Will trial acute PT services to address activity tolerance and mobility, recommend return to ALF/SNF.    Follow Up Recommendations SNF;Supervision/Assistance - 24 hour (ALF)    Equipment Recommendations  None recommended by PT    Recommendations for Other Services       Precautions / Restrictions Precautions Precautions: Fall(oxygen 2 liters)      Mobility  Bed Mobility Overal bed mobility: Needs Assistance Bed Mobility: Rolling;Supine to Sit;Sit to Supine Rolling: Min assist   Supine to sit: Min assist Sit to supine: Min assist   General bed mobility comments: Min assist for movement of LLE to EOB, assist to elevate trunk and rotate to EOB. Increased time and effort to perform.  Transfers                 General transfer comment: deferred at this time due to dizziness in sitting  Ambulation/Gait                Stairs            Wheelchair Mobility    Modified Rankin (Stroke Patients Only)       Balance Overall balance assessment: Needs assistance Sitting-balance support: Feet supported Sitting balance-Leahy Scale: Fair Sitting balance - Comments: able to sit EOB ~6 minutes with some dynamic activities but no over challenges                                     Pertinent Vitals/Pain       Home Living Family/patient expects to be discharged to:: Assisted living Living Arrangements: Other (Comment)               Additional Comments: [name]    Prior Function Level of Independence: Needs assistance   Gait / Transfers Assistance Needed: patient states that he is w/c bound at baseline but can transfer in and out of w/c with minimal assist at ALF     Comments: Poor historian re: PLF     Hand Dominance   Dominant Hand: Right    Extremity/Trunk Assessment   Upper Extremity Assessment Upper Extremity Assessment: Generalized weakness    Lower Extremity Assessment Lower Extremity Assessment: Generalized weakness;RLE deficits/detail;LLE deficits/detail RLE Deficits / Details: generalized RLE weakness throughout gross motions, noted ROM contractures in terminal knee extension ~20 degrees RLE Coordination: decreased gross motor LLE Deficits / Details: noted LLE deformity at the knee and distal weakness2/5 gross motions below the knee3/5 above LLE Coordination: decreased fine motor;decreased gross motor       Communication   Communication: No difficulties  Cognition Arousal/Alertness: Awake/alert Behavior During Therapy: WFL for tasks assessed/performed Overall Cognitive Status: No family/caregiver present to determine baseline cognitive functioning  General Comments      Exercises     Assessment/Plan    PT Assessment Patient needs continued PT services  PT Problem List Decreased strength;Decreased activity tolerance;Decreased balance;Decreased mobility;Decreased coordination;Decreased cognition;Cardiopulmonary status limiting activity       PT Treatment Interventions DME instruction;Functional mobility training;Therapeutic activities;Therapeutic exercise;Balance training;Neuromuscular re-education    PT Goals (Current goals can be found in the Care Plan section)  Acute Rehab PT  Goals Patient Stated Goal: to go back to ALF PT Goal Formulation: With patient Time For Goal Achievement: 05/21/17 Potential to Achieve Goals: Good    Frequency Min 2X/week   Barriers to discharge        Co-evaluation               AM-PAC PT "6 Clicks" Daily Activity  Outcome Measure Difficulty turning over in bed (including adjusting bedclothes, sheets and blankets)?: Unable Difficulty moving from lying on back to sitting on the side of the bed? : Unable Difficulty sitting down on and standing up from a chair with arms (e.g., wheelchair, bedside commode, etc,.)?: Unable Help needed moving to and from a bed to chair (including a wheelchair)?: Total Help needed walking in hospital room?: Total Help needed climbing 3-5 steps with a railing? : Total 6 Click Score: 6    End of Session Equipment Utilized During Treatment: Oxygen Activity Tolerance: Patient limited by fatigue Patient left: in bed;with call bell/phone within reach;with bed alarm set Nurse Communication: Mobility status PT Visit Diagnosis: Muscle weakness (generalized) (M62.81)    Time: 1583-0940 PT Time Calculation (min) (ACUTE ONLY): 17 min   Charges:         PT G Codes:        Alben Deeds, PT DPT  Board Certified Neurologic Specialist [place] 05/07/2017, 4:28 PM

## 2017-05-07 NOTE — Plan of Care (Signed)
  Problem: Clinical Measurements: Goal: Will remain free from infection Outcome: Progressing Goal: Diagnostic test results will improve Outcome: Progressing   Problem: Nutrition: Goal: Adequate nutrition will be maintained Outcome: Progressing   Problem: Safety: Goal: Ability to remain free from injury will improve Outcome: Progressing   Problem: Skin Integrity: Goal: Risk for impaired skin integrity will decrease Outcome: Progressing   Patient continues iv vanc zosyn for sepsis Patient continues iv NS Patient on seizure precautions and falls precautions

## 2017-05-07 NOTE — Procedures (Signed)
ELECTROENCEPHALOGRAM REPORT  Date of Study: 05/07/2017  Patient's Name: Ricky [name]. MRN: [id number] Date of Birth: 13-Jul-1940  Referring Provider: Dyanne Carrel, NP  Clinical History:  77 y.o. male with a Past Medical History of DM; schizoprhenia; HTN; HLD; DM; and dementia who presents with respiratory distress and possible seizure.  Medications: Insulin Zosyn Pravastatin Vancomycin  Technical Summary: A multichannel digital EEG recording measured by the international 10-20 system with electrodes applied with paste and impedances below 5000 ohms performed in our laboratory with EKG monitoring in an awak patient.  Hyperventilation was not performed.  Photic stimulation was performed.  The digital EEG was referentially recorded, reformatted, and digitally filtered in a variety of bipolar and referential montages for optimal display.    Description: The patient is awake during the recording.  During maximal wakefulness, there is a symmetric, medium voltage 9 Hz posterior dominant rhythm that attenuates with eye opening.  The record is symmetric.  Stage 2 sleep is not seen.  Photic stimulation produced no abnormalities.  There were no epileptiform discharges or electrographic seizures seen.    EKG lead was unremarkable.  Impression: This awake EEG is normal.    Clinical Correlation: A normal EEG does not exclude a clinical diagnosis of epilepsy.  If further clinical questions remain, prolonged EEG may be helpful.  Clinical correlation is advised.   Metta Clines, DO

## 2017-05-07 NOTE — Progress Notes (Signed)
Patient admitted from ED to room 5w06. Alert/oriented x4. Oxygen 3L via [place], vitals stable. Lung sounds rhonchi/slight wheezing bilateral posterior. Patient has non productive cough with encouragement. Patient oriented to room. Admission education provided and assessment completed and documented in flowsheet.

## 2017-05-07 NOTE — Progress Notes (Addendum)
PROGRESS NOTE   Ricky [name].  UEK:[id number]    DOB: 01/11/1941    DOA: 05/06/2017  PCP: Deland Pretty, MD   I have briefly reviewed patients previous medical records in [facility]  Brief Narrative:  77 year old male, resident of [facility], wheelchair mobile per his report, PMH of dementia, depression, DM2, HLD, HTN, schizophrenia, presented to ED after staff at facility witnessed seizure which lasted 3-4 minutes.  EMS noted tachycardia of 150 and hypoxia.  History of intermittent fever, weakness and decreased oral intake for last couple days PTA.  Placed on CPAP by EMS and transported to ED.  In ED transitioned to BiPAP due to respiratory distress, T-max 101, hypotension, tachycardia, tachypnea.  Admitted for sepsis due to healthcare associated pneumonia and possible seizure.   Assessment & Plan:   Principal Problem:   Acute respiratory failure ([place]) Active Problems:   Diabetes mellitus without complication ([place])   Hypertension   Fatty liver   Schizophrenia ([place])   Urinary retention   HCAP (healthcare-associated pneumonia)   Sepsis ([place])   Seizure ([place])   CKD (chronic kidney disease), stage II   Sepsis secondary to healthcare associated pneumonia Vs Aspiration PNA from Seizure: Patient met sepsis criteria on admission including fever, tachycardia, tachypnea, leukocytosis, hypotension and elevated lactate.  He was treated per sepsis protocol with IV fluids and started on broad-spectrum IV antibiotics including vancomycin and Zosyn.  Urine culture shows >100,000 colonies per mL of unidentified organism (no urinary symptoms).  Chest x-ray on admission suggested left lung base atelectasis but infiltrate could not be ruled out.  Sepsis physiology resolved.  MRSA PCR negative.  DC vancomycin.  Continue Zosyn.  Repeat chest x-ray in a.m. to see if pneumonia shows up better.  Pro-calcitonin elevated/14.41 from 6.62.  Acute respiratory failure with hypoxia: Likely related to  pneumonia, atelectasis, seizure with mental status changes.  Treat underlying cause.  Incentive spirometry.  Required BiPAP on admission, now off.  Wean oxygen as tolerated.  Possible seizure: Reported by ALF staff.  EEG negative for seizures.  No AEDs initiated unless he has another seizure.  Elevated lactate: Secondary to sepsis.  Presented with lactate of 4.57.  Improved to 2.3.  Acute on stage II chronic kidney disease: Presented with creatinine of 1.5 which has improved to 1.09.  Type II DM: A1c 7.6.  Continue Levemir and SSI at current doses.  Holding oral hypoglycemics.  Essential hypertension: Presented with hypotension which has resolved.  Controlled.  Antihypertensives currently on hold.  Consider resuming amlodipine in a.m. or at discharge.  History of urinary retention: Continue Flomax & Myrbetriq.  Asymptomatic bacteriuria noted.  History of depression/schizophrenia: Appears stable without audiovisual hallucinations or suicidal or homicidal ideations.  Resume PTA medications.  Dementia: Mental status probably at baseline.   DVT prophylaxis: Lovenox Code Status: DNR Family Communication: None at bedside Disposition: DC back to ALF pending medical improvement, possibly in 1-2 days.   Consultants:  None  Procedures:  BiPAP  Antimicrobials:  IV vancomycin-discontinued 4/17 IV Zosyn   Subjective: Patient says that he cannot recollect events that brought him to the hospital.  Denies prior seizure disorder or family history of seizures.  States that he has impaired short-term memory but good long-term memory.  Denies home oxygen use.  Reports occasional cough which is dry.  Also indicates chronic weakness in left lower extremity.  ROS: As above.  Objective:  Vitals:   05/06/17 2117 05/07/17 0312 05/07/17 0316 05/07/17 1315  BP: 135/79  123/74 115/78  Pulse: 95  86 67  Resp: 20  18 18   Temp: 98.7 F (37.1 C)  98.7 F (37.1 C) 99 F (37.2 C)  TempSrc: Oral   Oral   SpO2: 95%  97% 96%  Weight: 100.4 kg (221 lb 5.5 oz) 103.3 kg (227 lb 11.8 oz)    Height: 5' 9"  (1.753 m)       Examination:  General exam: Pleasant elderly male, moderately built and nourished lying comfortably propped up in bed. Respiratory system: Clear to auscultation anteriorly.  Slightly diminished breath sounds in the bases with occasional basal crackles. Respiratory effort normal. Cardiovascular system: S1 & S2 heard, RRR. No JVD, murmurs, rubs, gallops or clicks. No pedal edema.  Telemetry personally reviewed: Sinus rhythm with first-degree AV block. Gastrointestinal system: Abdomen is nondistended, soft and nontender. No organomegaly or masses felt. Normal bowel sounds heard. Central nervous system: Alert and oriented x2. No focal neurological deficits. Extremities: Symmetric 5 x 5 power in upper extremities and right lower extremity.  Grade 3 x 5 at least in left lower extremity. Skin: Scabbed lesion noted over left knee without acute findings. Psychiatry: Judgement and insight impaired. Mood & affect appropriate.     Data Reviewed: I have personally reviewed following labs and imaging studies  CBC: Recent Labs  Lab 05/06/17 1411 05/07/17 0352  WBC 18.3* 20.2*  NEUTROABS 16.6*  --   HGB 14.3 13.8  HCT 43.3 41.0  MCV 91.2 92.6  PLT 211 616   Basic Metabolic Panel: Recent Labs  Lab 05/06/17 1411 05/07/17 0352  NA 139 139  K 3.3* 3.9  CL 100* 104  CO2 25 22  GLUCOSE 241* 135*  BUN 20 15  CREATININE 1.50* 1.09  CALCIUM 9.6 8.6*   Liver Function Tests: Recent Labs  Lab 05/06/17 1411 05/07/17 0352  AST 26 22  ALT 20 19  ALKPHOS 99 68  BILITOT 0.7 1.3*  PROT 7.6 6.4*  ALBUMIN 3.9 3.2*   Coagulation Profile: Recent Labs  Lab 05/06/17 1723  INR 1.02   HbA1C: Recent Labs    05/06/17 1411  HGBA1C 7.6*   CBG: Recent Labs  Lab 05/06/17 1657 05/06/17 1852 05/06/17 2124 05/07/17 0809 05/07/17 1154  GLUCAP 163* 142* 142* 104* 169*     Recent Results (from the past 240 hour(s))  Urine culture     Status: Abnormal (Preliminary result)   Collection Time: 05/06/17  3:10 PM  Result Value Ref Range Status   Specimen Description URINE, RANDOM  Final   Special Requests NONE  Final   Culture (A)  Final    >=100,000 COLONIES/mL UNIDENTIFIED ORGANISM Performed at [facility], [address] [place], [place] [address]    Report Status PENDING  Incomplete  MRSA PCR Screening     Status: None   Collection Time: 05/06/17  9:44 PM  Result Value Ref Range Status   MRSA by PCR NEGATIVE NEGATIVE Final    Comment:        The GeneXpert MRSA Assay (FDA approved for NASAL specimens only), is one component of a comprehensive MRSA colonization surveillance program. It is not intended to diagnose MRSA infection nor to guide or monitor treatment for MRSA infections. Performed at [facility], [place] [address], [place], [place] [address]          Radiology Studies: Dg Chest [place] 1 View  Result Date: 05/06/2017 CLINICAL DATA:  77 year old male post seizure.  Initial encounter. EXAM: PORTABLE CHEST  1 VIEW COMPARISON:  09/17/2016 chest x-ray. FINDINGS: Poor inspiration. Crowding lung markings left lung base may represent atelectasis. Cannot exclude left lower lobe infiltrate. Heart size top-normal. Calcified mildly tortuous aorta. No pneumothorax. Bilateral shoulder joint degenerative changes. No acute fracture noted. IMPRESSION: Crowding of lung markings left lung base may represent atelectasis. Cannot exclude left lower lobe infiltrate. Aortic Atherosclerosis (ICD10-I70.0). Electronically Signed   By: Genia Del M.D.   On: 05/06/2017 14:38        Scheduled Meds: . enoxaparin (LOVENOX) injection  40 mg Subcutaneous Q24H  . insulin aspart  0-15 Units Subcutaneous TID WC  . insulin aspart  0-5 Units Subcutaneous QHS  . insulin detemir  20 Units Subcutaneous QHS  . pantoprazole  40 mg Oral Daily  . pravastatin   40 mg Oral q1800   Continuous Infusions: . sodium chloride 1,000 mL (05/07/17 1110)  . piperacillin-tazobactam (ZOSYN)  IV 3.375 g (05/07/17 1400)  . vancomycin Stopped (05/07/17 0424)     LOS: 1 day     Vernell Leep, MD, FACP, [facility]. [facility] Pager [phone]  If 7PM-7AM, please contact night-coverage [email or web address] Password [facility] 05/07/2017, 2:48 PM

## 2017-05-07 NOTE — Progress Notes (Signed)
EEG completed, results pending. 

## 2017-05-08 ENCOUNTER — Inpatient Hospital Stay (HOSPITAL_COMMUNITY): Payer: Medicare Other

## 2017-05-08 DIAGNOSIS — J690 Pneumonitis due to inhalation of food and vomit: Secondary | ICD-10-CM

## 2017-05-08 DIAGNOSIS — I10 Essential (primary) hypertension: Secondary | ICD-10-CM

## 2017-05-08 DIAGNOSIS — L89322 Pressure ulcer of left buttock, stage 2: Secondary | ICD-10-CM

## 2017-05-08 DIAGNOSIS — D72829 Elevated white blood cell count, unspecified: Secondary | ICD-10-CM

## 2017-05-08 LAB — GLUCOSE, CAPILLARY
GLUCOSE-CAPILLARY: 96 mg/dL (ref 65–99)
GLUCOSE-CAPILLARY: 166 mg/dL — AB (ref 65–99)
GLUCOSE-CAPILLARY: 139 mg/dL — AB (ref 65–99)
Glucose-Capillary: 154 mg/dL — ABNORMAL HIGH (ref 65–99)

## 2017-05-08 LAB — CBC
HCT: 41.4 % (ref 39.0–52.0)
HEMOGLOBIN: 13.1 g/dL (ref 13.0–17.0)
MCH: 29.6 pg (ref 26.0–34.0)
MCHC: 31.6 g/dL (ref 30.0–36.0)
MCV: 93.5 fL (ref 78.0–100.0)
Platelets: 159 10*3/uL (ref 150–400)
RBC: 4.43 MIL/uL (ref 4.22–5.81)
RDW: 14.4 % (ref 11.5–15.5)
WBC: 15.4 10*3/uL — AB (ref 4.0–10.5)

## 2017-05-08 LAB — PROCALCITONIN: Procalcitonin: 8.18 ng/mL

## 2017-05-08 NOTE — Progress Notes (Signed)
Sat O2 93% on RA. No acute distress noted, no complaints. Will continue to monitor.

## 2017-05-08 NOTE — Progress Notes (Signed)
ANTIBIOTIC CONSULT NOTE   Pharmacy Consult for Zosyn Indication: PNA, UTI  Allergies  Allergen Reactions  . Asa [Aspirin]     "Dr told me not to take it"  . Codeine Other (See Comments)    DIZZINESS with Tylenol 3  . Tylenol [Acetaminophen] Other (See Comments)    Dizziness with tylenol 3    Patient Measurements: Height: 5\' 9"  (175.3 cm) Weight: 227 lb 11.8 oz (103.3 kg) IBW/kg (Calculated) : 70.7 Adjusted Body Weight:    Vital Signs: Temp: 97.7 F (36.5 C) (04/18 0808) Temp Source: Oral (04/18 0808) BP: 133/75 (04/18 0808) Pulse Rate: 82 (04/18 0808) Intake/Output from previous day: 04/17 0701 - 04/18 0700 In: 1560 [P.O.:830; I.V.:630; IV Piggyback:100] Out: 3075 [Urine:3075] Intake/Output from this shift: Total I/O In: -  Out: 400 [Urine:400]  Labs: Recent Labs    05/06/17 1411 05/07/17 0352 05/08/17 0302  WBC 18.3* 20.2* 15.4*  HGB 14.3 13.8 13.1  PLT 211 169 159  CREATININE 1.50* 1.09  --    Estimated Creatinine Clearance: 67.2 mL/min (by C-G formula based on SCr of 1.09 mg/dL). No results for input(s): VANCOTROUGH, VANCOPEAK, VANCORANDOM, GENTTROUGH, GENTPEAK, GENTRANDOM, TOBRATROUGH, TOBRAPEAK, TOBRARND, AMIKACINPEAK, AMIKACINTROU, AMIKACIN in the last 72 hours.   Microbiology:   Medical History: Past Medical History:  Diagnosis Date  . Abnormality of gait   . Adenomatous colon polyp   . Arthritis   . Cataract   . Dementia   . Depression   . Diabetes mellitus without complication ([place])   . Fatty liver   . Hyperlipidemia   . Hypertension   . Internal hemorrhoids   . Other malaise and fatigue   . Peripheral edema   . Schizophrenia ([place])   . Type II or unspecified type diabetes mellitus without mention of complication, not stated as uncontrolled   . Urinary frequency   . Urinary retention     Assessment:  ID: Abx for possible sepsis. PNA on CXR. +UTI. Tmax 99. Tc99. WBC 15.4 back down. Scr down. LA down to 2.3. PC 14.41>8.18  (4/18)  Antimicrobials this admission: 4/16 vancomycin >4/17 4/16 zosyn >   Dose adjustments this admission: N/A   Microbiology results: 4/16 blood cx: 4/16 urine cx:>100,000 Enterococcus Faecalis 4/16: MRSA PCR negative  Goal of Therapy:  Eradication of infection  Plan:  Zosyn 3.375g IV q 8 hrs. No dose adjustments. Pharmacy will sign off. Please reconsult for further dosing assitance.  Ricky [name], PharmD, BCPS Clinical Staff Pharmacist Pager [phone]  Ricky [name] 05/08/2017,8:41 AM

## 2017-05-08 NOTE — Progress Notes (Signed)
PROGRESS NOTE   Ricky [name].  IPJ:[id number]    DOB: [birth year]    DOA: 05/06/2017  PCP: Deland Pretty, MD   I have briefly reviewed patients previous medical records in [facility].  Brief Narrative:  77 year old male, resident of [facility], wheelchair mobile per his report, PMH of dementia, depression, DM2, HLD, HTN, schizophrenia, presented to ED after staff at facility witnessed seizure which lasted 3-4 minutes.  EMS noted tachycardia of 150 and hypoxia.  History of intermittent fever, weakness and decreased oral intake for last couple days PTA.  Placed on CPAP by EMS and transported to ED.  In ED transitioned to BiPAP due to respiratory distress, T-max 101, hypotension, tachycardia, tachypnea.  Admitted for sepsis due to aspiration pneumonia and possible seizure.  Improving.   Assessment & Plan:   Principal Problem:   Acute respiratory failure ([facility]) Active Problems:   Diabetes mellitus without complication ([place])   Hypertension   Fatty liver   Schizophrenia ([place])   Urinary retention   HCAP (healthcare-associated pneumonia)   Sepsis ([place])   Seizure ([place])   CKD (chronic kidney disease), stage II   Pressure injury of skin   Sepsis secondary to Aspiration PNA from Seizure: Patient met sepsis criteria on admission including fever, tachycardia, tachypnea, leukocytosis, hypotension and elevated lactate.  He was treated per sepsis protocol with IV fluids and started on broad-spectrum IV antibiotics including vancomycin and Zosyn.  Urine culture shows >100,000 colonies per mL Enterococcus faecalis but patient lacks UTI symptoms.  Chest x-ray on admission suggested left lung base atelectasis but infiltrate could not be ruled out.  Repeat chest x-ray 4/18 personally reviewed and shows diffuse left lung infiltrate consistent with pneumonia.  Sepsis physiology resolved.  MRSA PCR negative.  Discontinued vancomycin.  Continue Zosyn and transition to oral Augmentin at discharge  possibly 4/19. Pro-calcitonin elevated: 6.6->14.41 > 8.18.  Leukocytosis improving.  Overall improving.  Will get speech therapy evaluation to ensure no dysphagia.  Recommend repeating chest x-ray in 4 weeks to ensure resolution of pneumonia findings.  Acute respiratory failure with hypoxia: Likely related to pneumonia, atelectasis, seizure with mental status changes.  Treat underlying cause.  Incentive spirometry.  Required BiPAP on admission, now off.  Wean off of oxygen.  Currently saturating at 93% on room air.  Hypoxia resolved.  Possible seizure: Reported by ALF staff.  EEG negative for seizures.  No AEDs initiated unless he has another seizure.  Discussed with Neuro hospitalist on call on 4/18 who recommended MRI of brain without contrast to evaluate for structural lesions and has been ordered.   Elevated lactate: Secondary to sepsis.  Presented with lactate of 4.57.  Improved to 2.3.  Acute on stage II chronic kidney disease: Presented with creatinine of 1.5 which has improved to 1.09.  Acute kidney injury resolved.  Type II DM: A1c 7.6.  Continue Levemir and SSI at current doses.  Holding oral hypoglycemics.  Mildly uncontrolled and fluctuating at times.  No change in regimen.  Essential hypertension: Presented with hypotension which has resolved.  Controlled.  Antihypertensives currently on hold.  Consider resuming amlodipine in a.m. at discharge.  History of urinary retention: Continue Flomax & Myrbetriq.  Asymptomatic bacteriuria noted.  History of depression/schizophrenia: Appears stable without audiovisual hallucinations or suicidal or homicidal ideations.  Resumed PTA medications.  Dementia: Mental status probably at baseline.  Leukocytosis: Likely related to sepsis and aspiration pneumonia.  Improving.   DVT prophylaxis: Lovenox Code Status: DNR Family Communication: Attempted to  call people listed on his emergency contact list.  Was able to reach the second person who  indicates that patient's healthcare power of attorney is in [place] and he will try and provide the contact number. Disposition: DC back to ALF pending medical improvement, possibly 4/19.  Social work consulted.   Consultants:  None  Procedures:  BiPAP  Antimicrobials:  IV vancomycin-discontinued 4/17 IV Zosyn   Subjective: Patient denies complaints.  Denies dyspnea, cough, chest pain, fever or chills.  As per RN, no acute issues reported.  Patient denies presence of pacemaker or any metallic implants.  This was confirmed by nursing as well.  ROS: As above.  Objective:  Vitals:   05/08/17 1008 05/08/17 1230 05/08/17 1328 05/08/17 1342  BP:  (!) 141/82 135/75   Pulse:      Resp:  20 18   Temp:  98.1 F (36.7 C) 98 F (36.7 C)   TempSrc:  Oral Oral   SpO2: 94% 94% 93% 93%  Weight:      Height:        Examination:  General exam: Pleasant elderly male, moderately built and nourished lying comfortably propped up in bed.  Does not appear in any distress. Respiratory system: Few left basal crackles but otherwise clear to auscultation.  No increased work of breathing. Cardiovascular system: S1 and S2 heard.  RRR.  No JVD or murmurs.  Trace bilateral ankle edema.  Telemetry personally reviewed: Sinus rhythm with first-degree AV block. Gastrointestinal system: Abdomen is nondistended, soft and nontender. No organomegaly or masses felt. Normal bowel sounds heard.  Stable. Central nervous system: Alert and oriented x2. No focal neurological deficits.  Stable. Extremities: Symmetric 5 x 5 power in upper extremities and right lower extremity.  Grade 3 x 5 at least in left lower extremity. Skin: Scabbed lesion noted over left knee without acute findings. Psychiatry: Judgement and insight impaired. Mood & affect appropriate.     Data Reviewed: I have personally reviewed following labs and imaging studies  CBC: Recent Labs  Lab 05/06/17 1411 05/07/17 0352 05/08/17 0302  WBC 18.3*  20.2* 15.4*  NEUTROABS 16.6*  --   --   HGB 14.3 13.8 13.1  HCT 43.3 41.0 41.4  MCV 91.2 92.6 93.5  PLT 211 169 382   Basic Metabolic Panel: Recent Labs  Lab 05/06/17 1411 05/07/17 0352  NA 139 139  K 3.3* 3.9  CL 100* 104  CO2 25 22  GLUCOSE 241* 135*  BUN 20 15  CREATININE 1.50* 1.09  CALCIUM 9.6 8.6*   Liver Function Tests: Recent Labs  Lab 05/06/17 1411 05/07/17 0352  AST 26 22  ALT 20 19  ALKPHOS 99 68  BILITOT 0.7 1.3*  PROT 7.6 6.4*  ALBUMIN 3.9 3.2*   Coagulation Profile: Recent Labs  Lab 05/06/17 1723  INR 1.02   HbA1C: Recent Labs    05/06/17 1411  HGBA1C 7.6*   CBG: Recent Labs  Lab 05/07/17 1154 05/07/17 1655 05/07/17 2201 05/08/17 0758 05/08/17 1150  GLUCAP 169* 126* 167* 96 154*    Recent Results (from the past 240 hour(s))  Blood Culture (routine x 2)     Status: None (Preliminary result)   Collection Time: 05/06/17  2:10 PM  Result Value Ref Range Status   Specimen Description BLOOD RIGHT FOREARM  Final   Special Requests   Final    BOTTLES DRAWN AEROBIC AND ANAEROBIC Blood Culture adequate volume   Culture   Final    NO GROWTH  2 DAYS Performed at [facility], [place] [address], [place], [place] [address]    Report Status PENDING  Incomplete  Blood Culture (routine x 2)     Status: None (Preliminary result)   Collection Time: 05/06/17  2:42 PM  Result Value Ref Range Status   Specimen Description BLOOD LEFT FOREARM  Final   Special Requests   Final    BOTTLES DRAWN AEROBIC AND ANAEROBIC Blood Culture results may not be optimal due to an excessive volume of blood received in culture bottles   Culture   Final    NO GROWTH 2 DAYS Performed at [facility], [place] [address], [place], [place] [address]    Report Status PENDING  Incomplete  Urine culture     Status: Abnormal (Preliminary result)   Collection Time: 05/06/17  3:10 PM  Result Value Ref Range Status   Specimen Description URINE, RANDOM  Final   Special  Requests NONE  Final   Culture (A)  Final    >=100,000 COLONIES/mL ENTEROCOCCUS FAECALIS REPEATING SUSCEPTIBILITIES Performed at [facility], [place] [address] [place], [place] [address]    Report Status PENDING  Incomplete  MRSA PCR Screening     Status: None   Collection Time: 05/06/17  9:44 PM  Result Value Ref Range Status   MRSA by PCR NEGATIVE NEGATIVE Final    Comment:        The GeneXpert MRSA Assay (FDA approved for NASAL specimens only), is one component of a comprehensive MRSA colonization surveillance program. It is not intended to diagnose MRSA infection nor to guide or monitor treatment for MRSA infections. Performed at [facility], [place] [address], [place], [place] [address]          Radiology Studies: Dg Chest 2 View  Result Date: 05/08/2017 CLINICAL DATA:  Shortness of breath.  Pneumonia. EXAM: CHEST - 2 VIEW COMPARISON:  05/06/2017.  09/15/2016. FINDINGS: Mediastinum and hilar structures normal. Diffuse left lung infiltrate. No pleural effusion or pneumothorax. Cardiomegaly with normal pulmonary vascularity. No acute bony abnormality. IMPRESSION: Diffuse left lung infiltrate consistent pneumonia. Followup PA and lateral chest X-ray is recommended in 3-4 weeks following trial of antibiotic therapy to ensure resolution and exclude underlying malignancy. Electronically Signed   By: Marcello Moores  Register   On: 05/08/2017 09:34   Dg Chest [place] 1 View  Result Date: 05/06/2017 CLINICAL DATA:  77 year old male post seizure.  Initial encounter. EXAM: PORTABLE CHEST 1 VIEW COMPARISON:  09/17/2016 chest x-ray. FINDINGS: Poor inspiration. Crowding lung markings left lung base may represent atelectasis. Cannot exclude left lower lobe infiltrate. Heart size top-normal. Calcified mildly tortuous aorta. No pneumothorax. Bilateral shoulder joint degenerative changes. No acute fracture noted. IMPRESSION: Crowding of lung markings left lung base may represent atelectasis.  Cannot exclude left lower lobe infiltrate. Aortic Atherosclerosis (ICD10-I70.0). Electronically Signed   By: Genia Del M.D.   On: 05/06/2017 14:38        Scheduled Meds: . ALPRAZolam  0.5 mg Oral QHS  . cholecalciferol  1,000 Units Oral Daily  . docusate sodium  100 mg Oral BID  . enoxaparin (LOVENOX) injection  40 mg Subcutaneous Q24H  . feeding supplement (PRO-STAT SUGAR FREE 64)  30 mL Oral BID WC  . fluvoxaMINE  100 mg Oral QHS  . insulin aspart  0-15 Units Subcutaneous TID WC  . insulin aspart  0-5 Units Subcutaneous QHS  . insulin detemir  20 Units Subcutaneous QHS  . mirabegron ER  25 mg Oral Daily  . OLANZapine  20 mg Oral QHS  . omega-3 acid ethyl esters  2 g Oral Daily  . pantoprazole  40 mg Oral Daily  . pravastatin  40 mg Oral q1800  . tamsulosin  0.4 mg Oral Daily   Continuous Infusions: . piperacillin-tazobactam (ZOSYN)  IV 3.375 g (05/08/17 1340)     LOS: 2 days     Vernell Leep, MD, FACP, [facility]. [facility] Pager [phone]  If 7PM-7AM, please contact night-coverage [email or web address] Password [email or web address] 05/08/2017, 2:17 PM

## 2017-05-09 ENCOUNTER — Inpatient Hospital Stay (HOSPITAL_COMMUNITY): Payer: Medicare Other

## 2017-05-09 LAB — GLUCOSE, CAPILLARY
GLUCOSE-CAPILLARY: 96 mg/dL (ref 65–99)
GLUCOSE-CAPILLARY: 163 mg/dL — AB (ref 65–99)
Glucose-Capillary: 150 mg/dL — ABNORMAL HIGH (ref 65–99)

## 2017-05-09 MED ORDER — AMOXICILLIN-POT CLAVULANATE 875-125 MG PO TABS: 1.0000 | ORAL_TABLET | Freq: Two times a day (BID) | ORAL | Status: AC

## 2017-05-09 MED ORDER — OLANZAPINE 20 MG PO TABS: 20.0000 mg | ORAL_TABLET | Freq: Every day | ORAL | Status: DC

## 2017-05-09 MED ORDER — ALPRAZOLAM 0.5 MG PO TABS: 0.5000 mg | ORAL_TABLET | Freq: Every day | ORAL | 0 refills | Status: DC

## 2017-05-09 NOTE — Progress Notes (Signed)
Patient discharge teaching given, including activity, diet, follow-up appoints, and medications. Patient verbalized understanding of all discharge instructions. IV access was d/c'd. Vitals are stable. Skin is intact except as charted in most recent assessments. Pt to be escorted out by [facility] to be transferred to [name]. Report called to G. Redmond Pulling, RN at [employer]

## 2017-05-09 NOTE — Clinical Social Work Placement (Signed)
   CLINICAL SOCIAL WORK PLACEMENT  NOTE [name] room 104 RN to call [phone] with report for room 104 prior to discharge.    Date:  05/09/2017  Patient Details  Name: Ricky [name]. MRN: [id number] Date of Birth: March 24, 1940  Clinical Social Work is seeking post-discharge placement for this patient at the Meta level of care (*CSW will initial, date and re-position this form in  chart as items are completed):  No(pt resident at [place])   Patient/family provided with [place] Work Department's list of facilities offering this level of care within the geographic area requested by the patient (or if unable, by the patient's family).  No   Patient/family informed of their freedom to choose among providers that offer the needed level of care, that participate in Medicare, Medicaid or managed care program needed by the patient, have an available bed and are willing to accept the patient.  No   Patient/family informed of [facility]'s ownership interest in [facility] and [facility], as well as of the fact that they are under no obligation to receive care at these facilities.  PASRR submitted to EDS on       PASRR number received on       Existing PASRR number confirmed on       FL2 transmitted to all facilities in geographic area requested by pt/family on 05/09/17     FL2 transmitted to all facilities within larger geographic area on       Patient informed that his/her managed care company has contracts with or will negotiate with certain facilities, including the following:        Yes   Patient/family informed of bed offers received.  Patient chooses bed at [facility]     Physician recommends and patient chooses bed at      Patient to be transferred to [facility] on 05/09/17.  Patient to be transferred to facility by [facility]     Patient family notified on 05/09/17 of transfer.  Name of  family member notified:  Ray, friend     PHYSICIAN       Additional Comment:    _______________________________________________ Alexander Mt, [place] 05/09/2017, 4:01 PM

## 2017-05-09 NOTE — Care Management Note (Signed)
Case Management Note  Patient Details  Name: Ricky [name]. MRN: [id number] Date of Birth: 27-Apr-1940  Subjective/Objective:     Acute respiratory failure. From SNF with hypoxia.         Action/Plan: Transition to SNF. CSW managing disposition to SNF.  Expected Discharge Date:                  Expected Discharge Plan:  Skilled Nursing Facility  In-House Referral:  Clinical Social Work  Discharge planning Services  CM Consult  Post Acute Care Choice:    Choice offered to:   N/A  DME Arranged:   N/A DME Agency:   N/A  HH Arranged:   N/A HH Agency:   N/A  Status of Service:  Completed, signed off  If discussed at [place] of Stay Meetings, dates discussed:    Additional Comments:  Sharin Mons, RN 05/09/2017, 10:20 AM

## 2017-05-09 NOTE — Discharge Summary (Addendum)
Physician Discharge Summary  Ricky [name]. XTG:[id number] DOB: 12-Nov-1940  PCP: Deland Pretty, MD  Admit date: 05/06/2017 Discharge date: 05/09/2017  Recommendations for Outpatient Follow-up:  1. MD at ALF (PT recommended SNF but as per social work, current ALF able to provide the needed level of care) in 5 days with repeat labs (CBC & BMP).  Please follow final blood culture results that were sent from the hospital. 2. Recommend repeating chest x-ray in 4 weeks to ensure resolution of pneumonia findings.  Home Health: N/A Equipment/Devices: N/A    Discharge Condition: Improved and stable CODE STATUS: DNR Diet recommendation: Heart healthy and diabetic diet.  Discharge Diagnoses:  Principal Problem:   Acute respiratory failure ([place]) Active Problems:   Diabetes mellitus without complication ([place])   Hypertension   Fatty liver   Schizophrenia ([place])   Urinary retention   HCAP (healthcare-associated pneumonia)   Sepsis ([place])   Seizure ([place])   CKD (chronic kidney disease), stage II   Pressure injury of skin   Brief Summary: 77 year old male, resident of [facility], wheelchair mobile per his report, PMH of dementia, depression, DM2, HLD, HTN, schizophrenia, presented to ED after staff at facility witnessed seizure which lasted 3-4 minutes.  EMS noted tachycardia of 150 and hypoxia.  History of intermittent fever, weakness and decreased oral intake for last couple days PTA.  Placed on CPAP by EMS and transported to ED.  In ED transitioned to BiPAP due to respiratory distress, T-max 101, hypotension, tachycardia, tachypnea.  Admitted for sepsis due to aspiration pneumonia and possible seizure.    Assessment & Plan:   Sepsis secondary to Aspiration PNA from Seizure: Patient met sepsis criteria on admission including fever, tachycardia, tachypnea, leukocytosis, hypotension and elevated lactate.  He was treated per sepsis protocol with IV fluids and started on broad-spectrum IV  antibiotics including vancomycin and Zosyn.  Urine culture shows >100,000 colonies per mL Enterococcus faecalis but patient lacks UTI symptoms.  Chest x-ray on admission suggested left lung base atelectasis but infiltrate could not be ruled out.  Repeat chest x-ray 4/18 personally reviewed and shows diffuse left lung infiltrate consistent with pneumonia.  Sepsis physiology resolved.  MRSA PCR negative.  Discontinued vancomycin.  Continued Zosyn and transitioned to oral Augmentin at discharge to complete a week's course of antibiotics on 05/12/17. Pro-calcitonin elevated: 6.6->14.41 > 8.18.  Leukocytosis improving.  Speech therapy evaluated and recommended regular consistency diet and thin liquids.  Clinically improved.  Recommend repeating chest x-ray in 4 weeks to ensure resolution of pneumonia findings.  Acute respiratory failure with hypoxia: Likely related to pneumonia, atelectasis, seizure with mental status changes.  Treat underlying cause.  Incentive spirometry.  Required BiPAP on admission, now off. Hypoxia resolved.  Possible seizure: Reported by ALF staff.  EEG negative for seizures.  No AEDs initiated unless he has another seizure.  Discussed with Neuro hospitalist on call on 4/18 who recommended MRI of brain without contrast to evaluate for structural lesions and has been ordered.   MRI negative for acute findings.  Reviewed MRI with the neuro hospitalist and no significant findings appreciated.  No driving for 6 months.  Elevated lactate: Secondary to sepsis.  Presented with lactate of 4.57.  Improved to 2.3.  Acute on stage II chronic kidney disease: Presented with creatinine of 1.5 which has improved to 1.09.  Acute kidney injury resolved.  Type II DM: A1c 7.6.    Patient was placed on reduced dose of Levemir and SSI.  Continue prior home  dose of insulins and oral medications with which he has had reasonable control based upon his A1c.  Adjust medications as needed at SNF.  Essential  hypertension: Patient had presented with hypotension and antihypertensives were held.  Blood pressure starting to creep up.  Resume antihypertensives at discharge.  History of urinary retention: Continue Flomax & Myrbetriq.  Asymptomatic bacteriuria noted.  History of depression/schizophrenia: Appears stable without audiovisual hallucinations or suicidal or homicidal ideations.  Resumed PTA medications.  Patient remains on bedtime Xanax chronically and needs to be continued.  Dementia: Mental status probably at baseline.  Leukocytosis: Likely related to sepsis and aspiration pneumonia.  Improving.    Consultants:  None  Procedures:  BiPAP    Discharge Instructions  Discharge Instructions    Call MD for:   Complete by:  As directed    Seizure-like activity.   Call MD for:  difficulty breathing, headache or visual disturbances   Complete by:  As directed    Call MD for:  extreme fatigue   Complete by:  As directed    Call MD for:  persistant dizziness or light-headedness   Complete by:  As directed    Call MD for:  persistant nausea and vomiting   Complete by:  As directed    Call MD for:  severe uncontrolled pain   Complete by:  As directed    Call MD for:  temperature >100.4   Complete by:  As directed    Diet - low sodium heart healthy   Complete by:  As directed    Diet Carb Modified   Complete by:  As directed    Discharge instructions   Complete by:  As directed    No driving for 6 months or until cleared by his outpatient physicians.   Increase activity slowly   Complete by:  As directed        Medication List    STOP taking these medications   traMADol 50 MG tablet Commonly known as:  ULTRAM     TAKE these medications   ALPRAZolam 0.5 MG tablet Commonly known as:  XANAX Take 1 tablet (0.5 mg total) by mouth at bedtime.   amLODipine 5 MG tablet Commonly known as:  NORVASC Take 5 mg by mouth daily.   amoxicillin-clavulanate 875-125 MG  tablet Commonly known as:  AUGMENTIN Take 1 tablet by mouth 2 (two) times daily for 4 days. Discontinue after 05/12/17 doses.   chlorhexidine 0.12 % solution Commonly known as:  PERIDEX Use as directed 15 mLs in the mouth or throat at bedtime.   CITRACAL + D PO Take 1 tablet by mouth daily.   docusate sodium 100 MG capsule Commonly known as:  COLACE Take 100 mg by mouth 2 (two) times daily.   feeding supplement (PRO-STAT SUGAR FREE 64) Liqd Take 30 mLs by mouth 2 (two) times daily with a meal.   fluvoxaMINE 100 MG tablet Commonly known as:  LUVOX Take 100 mg by mouth at bedtime.   furosemide 20 MG tablet Commonly known as:  LASIX Take 20 mg by mouth daily before breakfast.   hydrOXYzine 25 MG tablet Commonly known as:  ATARAX/VISTARIL Take 25 mg by mouth at bedtime as needed (sleep).   insulin detemir 100 UNIT/ML injection Commonly known as:  LEVEMIR Inject 40 Units into the skin at bedtime.   JARDIANCE 10 MG Tabs tablet Generic drug:  empagliflozin Take 10 mg by mouth daily.   magnesium hydroxide 400 MG/5ML suspension Commonly known as:  MILK OF MAGNESIA Take 30 mLs by mouth daily as needed for mild constipation.   meloxicam 15 MG tablet Commonly known as:  MOBIC Take 15 mg by mouth daily. For arthritis   metFORMIN 500 MG tablet Commonly known as:  GLUCOPHAGE Take 500 mg by mouth 2 (two) times daily with a meal.   MYRBETRIQ 25 MG Tb24 tablet Generic drug:  mirabegron ER Take 25 mg by mouth daily.   NOVOLOG FLEXPEN 100 UNIT/ML FlexPen Generic drug:  insulin aspart Inject 0-9 Units into the skin 3 (three) times daily as needed for high blood sugar. 101-150=1 units  151-200=2 units 201-250=3 units 251-300=5 units  301-350=7 units >350=9 units Call MD FOR BS>400 or <60 400 > call MD   OLANZapine 20 MG tablet Commonly known as:  ZYPREXA Take 1 tablet (20 mg total) by mouth at bedtime. What changed:  additional instructions   omega-3 acid ethyl esters 1  g capsule Commonly known as:  LOVAZA Take 2 g by mouth daily.   omeprazole 40 MG capsule Commonly known as:  PRILOSEC Take 40 mg by mouth 2 (two) times daily.   ONGLYZA 5 MG Tabs tablet Generic drug:  saxagliptin HCl Take 5 mg by mouth 2 (two) times daily.   polyethylene glycol packet Commonly known as:  MIRALAX / GLYCOLAX Take 17 g by mouth daily as needed.   pravastatin 40 MG tablet Commonly known as:  PRAVACHOL Take 40 mg by mouth daily after lunch.   PROSTATE Tabs Take 1 tablet by mouth 2 (two) times daily.   tamsulosin 0.4 MG Caps capsule Commonly known as:  FLOMAX Take 0.4 mg by mouth daily.   THERAGRAN-M PREMIER 50 PLUS PO Take 1 tablet by mouth daily.   Vitamin D-3 1000 units Caps Take 1 capsule by mouth daily.   zolpidem 5 MG tablet Commonly known as:  AMBIEN Take 5 mg by mouth at bedtime.       Contact information for follow-up providers    MD at SNF. Schedule an appointment as soon as possible for a visit in 5 day(s).   Why:  To be seen with repeat labs (CBC & BMP).  Recommend repeating chest x-ray in 4 weeks to ensure resolution of pneumonia findings.           Contact information for after-discharge care    Destination    [facility] SN[facility] .   Service:  Skilled Nursing Contact information: [place] [phone]                 Allergies  Allergen Reactions  . Asa [Aspirin]     "Dr told me not to take it"  . Codeine Other (See Comments)    DIZZINESS with Tylenol 3  . Tylenol [Acetaminophen] Other (See Comments)    Dizziness with tylenol 3      Procedures/Studies: Dg Chest 2 View  Result Date: 05/08/2017 CLINICAL DATA:  Shortness of breath.  Pneumonia. EXAM: CHEST - 2 VIEW COMPARISON:  05/06/2017.  09/15/2016. FINDINGS: Mediastinum and hilar structures normal. Diffuse left lung infiltrate. No pleural effusion or pneumothorax. Cardiomegaly with normal pulmonary  vascularity. No acute bony abnormality. IMPRESSION: Diffuse left lung infiltrate consistent pneumonia. Followup PA and lateral chest X-ray is recommended in 3-4 weeks following trial of antibiotic therapy to ensure resolution and exclude underlying malignancy. Electronically Signed   By: Marcello Moores  Register   On: 05/08/2017 09:34   Mr Brain Wo Contrast  Result Date: 05/09/2017  CLINICAL DATA:  New onset seizure EXAM: MRI HEAD WITHOUT CONTRAST TECHNIQUE: Multiplanar, multiecho pulse sequences of the brain and surrounding structures were obtained without intravenous contrast. COMPARISON:  MRI head 12/12/2012 FINDINGS: Brain: Mild atrophy consistent with age. Small white matter hyperintensities bilaterally compatible with mild chronic microvascular ischemia. Mild progression. Negative for acute infarct. Negative for hemorrhage or mass. No midline shift. Vascular: Normal arterial flow voids Skull and upper cervical spine: Negative Sinuses/Orbits: Mild mucosal edema paranasal sinus and mastoid sinus bilaterally. Bilateral cataract surgery Other: None IMPRESSION: Mild atrophy and mild chronic microvascular ischemic change in the white matter. No acute abnormality. Electronically Signed   By: Franchot Gallo M.D.   On: 05/09/2017 15:01   Dg Chest Port 1 View  Result Date: 05/06/2017 CLINICAL DATA:  77 year old male post seizure.  Initial encounter. EXAM: PORTABLE CHEST 1 VIEW COMPARISON:  09/17/2016 chest x-ray. FINDINGS: Poor inspiration. Crowding lung markings left lung base may represent atelectasis. Cannot exclude left lower lobe infiltrate. Heart size top-normal. Calcified mildly tortuous aorta. No pneumothorax. Bilateral shoulder joint degenerative changes. No acute fracture noted. IMPRESSION: Crowding of lung markings left lung base may represent atelectasis. Cannot exclude left lower lobe infiltrate. Aortic Atherosclerosis (ICD10-I70.0). Electronically Signed   By: Genia Del M.D.   On: 05/06/2017 14:38       Subjective: Patient seen this morning.  Denied complaints.  Denied cough, chest pain, dyspnea, fever or chills.  No seizure-like activity since hospital admission.  As per RN, no acute issues noted.  Discharge Exam:  Vitals:   05/08/17 1537 05/08/17 2100 05/08/17 2103 05/09/17 0523  BP: (!) 145/78  (!) 146/76 (!) 150/89  Pulse: 86  75 83  Resp: _0 Temp: 99.4 F (37.4 C) 99.2 F (37.3 C)  97.7 F (36.5 C)  TempSrc: Oral Oral  Oral  SpO2: 92%  91% 92%  Weight:      Height:        General exam: Pleasant elderly male, moderately built and nourished lying comfortably propped up in bed.  Does not appear in any distress. Respiratory system: Few left basal crackles but otherwise clear to auscultation.  No increased work of breathing. Cardiovascular system: S1 and S2 heard.  RRR.  No JVD or murmurs.  Trace bilateral ankle edema.   Gastrointestinal system: Abdomen is nondistended, soft and nontender. No organomegaly or masses felt. Normal bowel sounds heard.   Central nervous system: Alert and oriented x2. No focal neurological deficits.  Extremities: Symmetric 5 x 5 power in upper extremities and right lower extremity.  Grade 3 x 5 at least in left lower extremity - states always weak > unclear etiology. Skin: Scabbed lesion noted over left knee without acute findings. Psychiatry: Judgement and insight impaired. Mood & affect appropriate.       The results of significant diagnostics from this hospitalization (including imaging, microbiology, ancillary and laboratory) are listed below for reference.     Microbiology: Recent Results (from the past 240 hour(s))  Blood Culture (routine x 2)     Status: None (Preliminary result)   Collection Time: 05/06/17  2:10 PM  Result Value Ref Range Status   Specimen Description BLOOD RIGHT FOREARM  Final   Special Requests   Final    BOTTLES DRAWN AEROBIC AND ANAEROBIC Blood Culture adequate volume   Culture   Final    NO  GROWTH 3 DAYS Performed at [facility], [address], [place], [place] [address]  Report Status PENDING  Incomplete  Blood Culture (routine x 2)     Status: None (Preliminary result)   Collection Time: 05/06/17  2:42 PM  Result Value Ref Range Status   Specimen Description BLOOD LEFT FOREARM  Final   Special Requests   Final    BOTTLES DRAWN AEROBIC AND ANAEROBIC Blood Culture results may not be optimal due to an excessive volume of blood received in culture bottles   Culture   Final    NO GROWTH 3 DAYS Performed at [facility], [place] [address] [place], [place] [address]    Report Status PENDING  Incomplete  Urine culture     Status: Abnormal (Preliminary result)   Collection Time: 05/06/17  3:10 PM  Result Value Ref Range Status   Specimen Description URINE, RANDOM  Final   Special Requests NONE  Final   Culture (A)  Final    >=100,000 COLONIES/mL ENTEROCOCCUS FAECALIS SUSCEPTIBILITIES TO FOLLOW Performed at [facility], [place] [address], [place], [place] [address]    Report Status PENDING  Incomplete  MRSA PCR Screening     Status: None   Collection Time: 05/06/17  9:44 PM  Result Value Ref Range Status   MRSA by PCR NEGATIVE NEGATIVE Final    Comment:        The GeneXpert MRSA Assay (FDA approved for NASAL specimens only), is one component of a comprehensive MRSA colonization surveillance program. It is not intended to diagnose MRSA infection nor to guide or monitor treatment for MRSA infections. Performed at [facility], [place] [address], [place], [place] [address]      Labs: CBC: Recent Labs  Lab 05/06/17 1411 05/07/17 0352 05/08/17 0302  WBC 18.3* 20.2* 15.4*  NEUTROABS 16.6*  --   --   HGB 14.3 13.8 13.1  HCT 43.3 41.0 41.4  MCV 91.2 92.6 93.5  PLT 211 169 546   Basic Metabolic Panel: Recent Labs  Lab 05/06/17 1411 05/07/17 0352  NA 139 139  K 3.3* 3.9  CL 100* 104  CO2 25 22  GLUCOSE 241* 135*  BUN 20 15   CREATININE 1.50* 1.09  CALCIUM 9.6 8.6*   Liver Function Tests: Recent Labs  Lab 05/06/17 1411 05/07/17 0352  AST 26 22  ALT 20 19  ALKPHOS 99 68  BILITOT 0.7 1.3*  PROT 7.6 6.4*  ALBUMIN 3.9 3.2*   CBG: Recent Labs  Lab 05/08/17 1150 05/08/17 1748 05/08/17 2105 05/09/17 0807 05/09/17 1215  GLUCAP 154* 166* 139* 96 163*   Urinalysis    Component Value Date/Time   COLORURINE YELLOW 05/06/2017 1510   APPEARANCEUR CLEAR 05/06/2017 1510   LABSPEC 1.016 05/06/2017 1510   PHURINE 5.0 05/06/2017 1510   GLUCOSEU >=500 (A) 05/06/2017 1510   HGBUR NEGATIVE 05/06/2017 1510   BILIRUBINUR NEGATIVE 05/06/2017 1510   KETONESUR NEGATIVE 05/06/2017 1510   PROTEINUR NEGATIVE 05/06/2017 1510   UROBILINOGEN 0.2 12/10/2012 1322   NITRITE NEGATIVE 05/06/2017 1510   LEUKOCYTESUR NEGATIVE 05/06/2017 1510   I discussed in detail with patient's friend listed in the chart.  Updated care and answered questions.  I was unable to reach patient's healthcare power of attorney who apparently is located out of state.  Time coordinating discharge: 45 minutes.  SIGNED:  Vernell Leep, MD, FACP, [facility]. [facility] Pager [phone]  If 7PM-7AM, please contact night-coverage [email or web address] Password [facility] 05/09/2017, 3:42 PM

## 2017-05-09 NOTE — Discharge Instructions (Signed)
Please get your medications reviewed and adjusted by your Primary MD. ° °Please request your Primary MD to go over all Hospital Tests and Procedure/Radiological results at the follow up, please get all Hospital records sent to your Prim MD by signing hospital release before you go home. ° °If you had Pneumonia of Lung problems at the Hospital: °Please get a 2 view Chest X ray done in 6-8 weeks after hospital discharge or sooner if instructed by your Primary MD. ° °If you have Congestive Heart Failure: °Please call your Cardiologist or Primary MD anytime you have any of the following symptoms:  °1) 3 pound weight gain in 24 hours or 5 pounds in 1 week  °2) shortness of breath, with or without a dry hacking cough  °3) swelling in the hands, feet or stomach  °4) if you have to sleep on extra pillows at night in order to breathe ° °Follow cardiac low salt diet and 1.5 lit/day fluid restriction. ° °If you have diabetes °Accuchecks 4 times/day, Once in AM empty stomach and then before each meal. °Log in all results and show them to your primary doctor at your next visit. °If any glucose reading is under 80 or above 300 call your primary MD immediately. ° °If you have Seizure/Convulsions/Epilepsy: °Please do not drive, operate heavy machinery, participate in activities at heights or participate in high speed sports until you have seen by Primary MD or a Neurologist and advised to do so again. ° °If you had Gastrointestinal Bleeding: °Please ask your Primary MD to check a complete blood count within one week of discharge or at your next visit. Your endoscopic/colonoscopic biopsies that are pending at the time of discharge, will also need to followed by your Primary MD. ° °Get Medicines reviewed and adjusted. °Please take all your medications with you for your next visit with your Primary MD ° °Please request your Primary MD to go over all hospital tests and procedure/radiological results at the follow up, please ask your  Primary MD to get all Hospital records sent to his/her office. ° °If you experience worsening of your admission symptoms, develop shortness of breath, life threatening emergency, suicidal or homicidal thoughts you must seek medical attention immediately by calling 911 or calling your MD immediately  if symptoms less severe. ° °You must read complete instructions/literature along with all the possible adverse reactions/side effects for all the Medicines you take and that have been prescribed to you. Take any new Medicines after you have completely understood and accpet all the possible adverse reactions/side effects.  ° °Do not drive or operate heavy machinery when taking Pain medications.  ° °Do not take more than prescribed Pain, Sleep and Anxiety Medications ° °Special Instructions: If you have smoked or chewed Tobacco  in the last 2 yrs please stop smoking, stop any regular Alcohol  and or any Recreational drug use. ° °Wear Seat belts while driving. ° °Please note °You were cared for by a hospitalist during your hospital stay. If you have any questions about your discharge medications or the care you received while you were in the hospital after you are discharged, you can call the unit and asked to speak with the hospitalist on call if the hospitalist that took care of you is not available. Once you are discharged, your primary care physician will handle any further medical issues. Please note that NO REFILLS for any discharge medications will be authorized once you are discharged, as it is imperative that you   return to your primary care physician (or establish a relationship with a primary care physician if you do not have one) for your aftercare needs so that they can reassess your need for medications and monitor your lab values.  You can reach the hospitalist office at phone [phone] or fax [phone]   If you do not have a primary care physician, you can call [phone] for a physician  referral.   Aspiration Pneumonia Aspiration pneumonia is an infection in your lungs. It occurs when food, liquid, or stomach contents (vomit) are inhaled (aspirated) into your lungs. When these things get into your lungs, swelling (inflammation) and infection can occur. This can make it difficult for you to breathe. Aspiration pneumonia is a serious condition and can be life threatening. What increases the risk? Aspiration pneumonia is more likely to occur when a person's cough (gag) reflex or ability to swallow has been decreased. Some things that can do this include:  Having a brain injury or disease, such as stroke, seizures, Parkinson's disease, dementia, or amyotrophic lateral sclerosis (ALS).  Being given general anesthetic for procedures.  Being in a coma (unconscious).  Having a narrowing of the tube that carries food to the stomach (esophagus).  Drinking too much alcohol. If a person passes out and vomits, vomit can be swallowed into the lungs.  Taking certain medicines, such as tranquilizers or sedatives.  What are the signs or symptoms?  Coughing after swallowing food or liquids.  Breathing problems, such as wheezing or shortness of breath.  Bluish skin. This can be caused by lack of oxygen.  Coughing up food or mucus. The mucus might contain blood, greenish material, or yellowish-white fluid (pus).  Fever.  Chest pain.  Being more tired than usual (fatigue).  Sweating more than usual.  Bad breath. How is this diagnosed? A physical exam will be done. During the exam, the health care provider will listen to your lungs with a stethoscope to check for:  Crackling sounds in the lungs.  Decreased breath sounds.  A rapid heartbeat.  Various tests may be ordered. These may include:  Chest X-ray.  CT scan.  Swallowing study. This test looks at how food is swallowed and whether it goes into your breathing tube (trachea) or food pipe (esophagus).  Sputum  culture. Saliva and mucus (sputum) are collected from the lungs or the tubes that carry air to the lungs (bronchi). The sputum is then tested for bacteria.  Bronchoscopy. This test uses a flexible tube (bronchoscope) to see inside the lungs.  How is this treated? Treatment will usually include antibiotic medicines. Other medicines may also be used to reduce fever or pain. You may need to be treated in the hospital. In the hospital, your breathing will be carefully monitored. Depending on how well you are breathing, you may need to be given oxygen, or you may need breathing support from a breathing machine (ventilator). For people who fail a swallowing study, a feeding tube might be placed in the stomach, or they may be asked to avoid certain food textures or liquids when they eat. Follow these instructions at home:  Carefully follow any special eating instructions you were given, such as avoiding certain food textures or thickening liquids. This reduces the risk of developing aspiration pneumonia again.  Only take over-the-counter or prescription medicines as directed by your health care provider. Follow the directions carefully.  If you were prescribed antibiotics, take them as directed. Finish them even if you start to feel better.  Rest as instructed by your health care provider.  Keep all follow-up appointments with your health care provider. Contact a health care provider if:  You develop worsening shortness of breath, wheezing, or difficulty breathing.  You develop a fever.  You have chest pain. This information is not intended to replace advice given to you by your health care provider. Make sure you discuss any questions you have with your health care provider. Document Released: 11/04/2008 Document Revised: 06/21/2015 Document Reviewed: 06/25/2012 Elsevier Interactive Patient Education  2017 [employer].   Seizure, Adult A seizure is a sudden burst of abnormal electrical  activity in the brain. The abnormal activity temporarily interrupts normal brain function, causing a person to experience any of the following:  Involuntary movements.  Changes in awareness or consciousness.  Uncontrollable shaking (convulsions).  Seizures usually last from 30 seconds to 2 minutes. They usually do not cause permanent brain damage unless they are prolonged. What can cause a seizure to happen? Seizures can happen for many reasons including:  A fever.  Low blood sugar.  A medicine.  An illnesses.  A brain injury.  Some people who have a seizure never have another one. People who have repeated seizures have a condition called epilepsy. What are the symptoms of a seizure? Symptoms of a seizure vary greatly from person to person. They include:  Convulsions.  Stiffening of the body.  Involuntary movements of the arms or legs.  Loss of consciousness.  Breathing problems.  Falling suddenly.  Confusion.  Head nodding.  Eye blinking or fluttering.  Lip smacking.  Drooling.  Rapid eye movements.  Grunting.  Loss of bladder control and bowel control.  Staring.  Unresponsiveness.  Some people have symptoms right before a seizure happens (aura) and right after a seizure happens. Symptoms of an aura include:  Fear or anxiety.  Nausea.  Feeling like the room is spinning (vertigo).  A feeling of having seen or heard something before (deja vu).  Odd tastes or smells.  Changes in vision, such as seeing flashing lights or spots.  Symptoms that may follow a seizure include:  Confusion.  Sleepiness.  Headache.  Weakness of one side of the body.  Follow these instructions at home: Medicines   Take over-the-counter and prescription medicines only as told by your health care provider.  Avoid any substances that may prevent your medicine from working properly, such as alcohol. Activity  Do not drive, swim, or do any other activities  that would be dangerous if you had another seizure. Wait until your health care provider approves.  If you live in the [place] check with your local DMV (department of motor vehicles) to find out about the local driving laws. Each state has specific rules about when you can legally return to driving.  Get enough rest. Lack of sleep can make seizures more likely to occur. Educating others Teach friends and family what to do if you have a seizure. They should:  Lay you on the ground to prevent a fall.  Cushion your head and body.  Loosen any tight clothing around your neck.  Turn you on your side. If vomiting occurs, this helps keep your airway clear.  Stay with you until you recover.  Not hold you down. Holding you down will not stop the seizure.  Not put anything in your mouth.  Know whether or not you need emergency care.  General instructions  Contact your health care provider each time you have a seizure.  Avoid  anything that has ever triggered a seizure for you.  Keep a seizure diary. Record what you remember about each seizure, especially anything that might have triggered the seizure.  Keep all follow-up visits as told by your health care provider. This is important. Contact a health care provider if:  You have another seizure.  You have seizures more often.  Your seizure symptoms change.  You continue to have seizures with treatment.  You have symptoms of an infection or illness. They might increase your risk of having a seizure. Get help right away if:  You have a seizure: ? That lasts longer than 5 minutes. ? That is different than previous seizures. ? That leaves you unable to speak or use a part of your body. ? That makes it harder to breathe. ? After a head injury.  You have: ? Multiple seizures in a row. ? Confusion or a severe headache right after a seizure.  You are having seizures more often.  You do not wake up immediately after a  seizure.  You injure yourself during a seizure. These symptoms may represent a serious problem that is an emergency. Do not wait to see if the symptoms will go away. Get medical help right away. Call your local emergency services (911 in the [place] Do not drive yourself to the hospital. This information is not intended to replace advice given to you by your health care provider. Make sure you discuss any questions you have with your health care provider. Document Released: 01/05/2000 Document Revised: 09/03/2015 Document Reviewed: 08/11/2015 Elsevier Interactive Patient Education  [employer].

## 2017-05-09 NOTE — Clinical Social Work Note (Signed)
Clinical Social Work Assessment  Patient Details  Name: Ricky [name]. MRN: [id number] Date of Birth: 03/16/1940  Date of referral:  05/09/17               Reason for consult:  Facility Placement, Discharge Planning                Permission sought to share information with:  Facility Sport and exercise psychologist, Family Supports Permission granted to share information::  Yes, Verbal Permission Granted  Name::      [name] & [name]  Agency::   [employer] [name]  Relationship::   financial representative & friend  Contact Information:   [phone] & [phone]  Housing/Transportation Living arrangements for the past 2 months:  [place] of Information:  Patient, Other (Comment Required)(Financial representative) Patient Interpreter Needed:  None Criminal Activity/Legal Involvement Pertinent to Current Situation/Hospitalization:  No - Comment as needed Significant Relationships:  Warehouse manager, Friend Lives with:  Facility Resident Do you feel safe going back to the place where you live?  Yes Need for family participation in patient care:  Yes (Comment)  Care giving concerns:  Pt is from ALF, has good supports to manage his care and affairs. Will continue to need ALF/SNF level care in order to care for him self due to cognition limitations.    Social Worker assessment / plan:  CSW met with pt at bedside, pt states he has been a resident at [name] for the past 3 years and before lived at [name] for 10 years. Pt states that he knows he needs the level of care that [name] provides. Pt states that he is amenable to returning. Of note, throughout assessment pt was distracted by bowel movement and cognitive deficits were noted by pt's easy distractibility.   Pt gave permission to speak with financial representative and his friend. Financial representative [name] at [employer], when contacted states that he  is only responsible for the financial well being and affairs of pt. He encouraged this Probation officer to contact the pt's friend [name].   CSW reached out to pt friend [name], received call back. Pt friend in agreement with plan, had questions about pt's status and wanted to make sure that pt is okay returning to [name]. CSW discussed therapy recommendations and that pt would be able to receive therapies on the ALF side of [name].   Employment status:  Retired, Disabled (Comment on whether or not currently receiving Disability) Insurance information:  Programmer, applications PT Recommendations:  24 Hour Supervision, Skilled Nursing Facility(therapies at [facility]) Information / Referral to community resources:  [place]  Patient/Family's Response to care:  Pt and pt's supporters are aware of recommendations, are in agreement with CSW plan and understand role of CSW in discharge.   Patient/Family's Understanding of and Emotional Response to Diagnosis, Current Treatment, and Prognosis:  Pt and pt supporters state understanding of diagnosis, current treatment and prognosis. Pt friend especially states reasonable expectations of pt and pt needs, pt friend [name] seems to be a good concerned friend looking out for best interests of the pt.   Emotional Assessment Appearance:  Appears stated age Attitude/Demeanor/Rapport:  Apprehensive, Engaged Affect (typically observed):  Accepting, Appropriate, Calm(has obvious signs of dementia but is relatively oriented) Orientation:  Oriented to Self, Oriented to Place, Oriented to Situation, Fluctuating Orientation (Suspected and/or reported Sundowners) Alcohol / Substance use:  Not Applicable Psych involvement (Current and /or in the community):  No (Comment)  Discharge Needs  Concerns to be addressed:  Care Coordination, Discharge Planning Concerns Readmission within the last 30 days:  No Current discharge risk:  Cognitively Impaired, Dependent  with Mobility Barriers to Discharge:  Continued Medical Work up   [employer], [place] 05/09/2017, 12:51 PM

## 2017-05-09 NOTE — NC FL2 (Addendum)
[place] LEVEL OF CARE SCREENING TOOL     IDENTIFICATION  Patient Name: Ricky [name]. Birthdate: [birth year] Sex: male Admission Date (Current Location): 05/06/2017  [facility] and [place] Number:  Herbalist and Address:  [facility], [place] [address] [place], [place] [address]      Provider Number: [id number]  Attending Physician Name and Address:  Modena Jansky, MD  Relative Name and Phone Number:   [name] (financial representative) [phone] [name] (friend) [phone]    Current Level of Care: Hospital Recommended Level of Care: [facility] Prior Approval Number:    Date Approved/Denied:   PASRR Number:    Discharge Plan: Other (Comment)(ALF at [facility])    Current Diagnoses: Patient Active Problem List   Diagnosis Date Noted  . Pressure injury of skin 05/08/2017  . Acute respiratory failure ([place]) 05/06/2017  . HCAP (healthcare-associated pneumonia) 05/06/2017  . Sepsis ([place]) 05/06/2017  . Seizure ([place]) 05/06/2017  . CKD (chronic kidney disease), stage II 05/06/2017  . Food impaction of esophagus   . Esophageal stricture   . Hiatal hernia   . Urinary retention 12/10/2012  . Rhabdomyolysis 12/10/2012  . Falls 12/10/2012  . First degree AV block 10/20/2012  . Extremity muscle atrophy 10/20/2012  . Lumbosacral plexopathy 07/20/2012  . Diabetes mellitus without complication ([place])   . Hypertension   . Depression   . Arthritis   . Hyperlipidemia   . Cataract   . Urinary frequency   . Fatty liver   . Schizophrenia ([place])   . Peripheral edema   . Internal hemorrhoids   . Adenomatous colon polyp   . Type II or unspecified type diabetes mellitus without mention of complication, not stated as uncontrolled   . Type II or unspecified type diabetes mellitus with neurological manifestations, not stated as uncontrolled(250.60) 03/20/2012  . Abnormality of gait  03/20/2012  . Other malaise and fatigue 03/20/2012    Orientation RESPIRATION BLADDER Height & Weight     Self, Situation, Place  Normal Continent Weight: 227 lb 11.8 oz (103.3 kg) Height:  5\' 9"  (175.3 cm)  BEHAVIORAL SYMPTOMS/MOOD NEUROLOGICAL BOWEL NUTRITION STATUS    Convulsions/Seizures Continent Diet(see discharge summary)  AMBULATORY STATUS COMMUNICATION OF NEEDS Skin   Total Care Verbally PU Stage and Appropriate Care(on buttocks (left and right))   PU Stage 2 Dressing: (foam dressing every 3 days changed)                   Personal Care Assistance Level of Assistance  Feeding, Bathing, Dressing Bathing Assistance: Maximum assistance Feeding assistance: Limited assistance Dressing Assistance: Maximum assistance     Functional Limitations Info  Sight, Hearing, Speech Sight Info: Adequate Hearing Info: Adequate Speech Info: Adequate    SPECIAL CARE FACTORS FREQUENCY  PT (By licensed PT)     PT Frequency: 2x week              Contractures Contractures Info: Not present    Additional Factors Info  Code Status, Allergies Code Status Info: DNR Allergies Info: ASA ASPIRIN, CODEINE, TYLENOL ACETAMINOPHEN            Current Medications (05/09/2017):  This is the current hospital active medication list Current Facility-Administered Medications  Medication Dose Route Frequency Provider Last Rate Last Dose  . ALPRAZolam Duanne Moron) tablet 0.5 mg  0.5 mg Oral QHS Modena Jansky, MD   0.5 mg at 05/08/17 2238  . cholecalciferol (  VITAMIN D) tablet 1,000 Units  1,000 Units Oral Daily Modena Jansky, MD   1,000 Units at 05/09/17 [phone]  . docusate sodium (COLACE) capsule 100 mg  100 mg Oral BID Modena Jansky, MD   100 mg at 05/09/17 0926  . enoxaparin (LOVENOX) injection 40 mg  40 mg Subcutaneous Q24H Radene Gunning, NP   40 mg at 05/08/17 1550  . feeding supplement (PRO-STAT SUGAR FREE 64) liquid 30 mL  30 mL Oral BID WC Hongalgi, Lenis Dickinson, MD   30 mL at  05/08/17 1811  . fluvoxaMINE (LUVOX) tablet 100 mg  100 mg Oral QHS Modena Jansky, MD   100 mg at 05/08/17 2238  . insulin aspart (novoLOG) injection 0-15 Units  0-15 Units Subcutaneous TID WC Radene Gunning, NP   3 Units at 05/08/17 1812  . insulin aspart (novoLOG) injection 0-5 Units  0-5 Units Subcutaneous QHS Black, Karen M, NP      . insulin detemir (LEVEMIR) injection 20 Units  20 Units Subcutaneous QHS Radene Gunning, NP   20 Units at 05/08/17 2238  . LORazepam (ATIVAN) injection 1 mg  1 mg Intravenous Q4H PRN Karmen Bongo, MD      . mirabegron ER [facility]) tablet 25 mg  25 mg Oral Daily Modena Jansky, MD   25 mg at 05/09/17 8891  . OLANZapine (ZYPREXA) tablet 20 mg  20 mg Oral QHS Modena Jansky, MD   20 mg at 05/08/17 2238  . omega-3 acid ethyl esters (LOVAZA) capsule 2 g  2 g Oral Daily Modena Jansky, MD   2 g at 05/09/17 0926  . pantoprazole (PROTONIX) EC tablet 40 mg  40 mg Oral Daily Radene Gunning, NP   40 mg at 05/09/17 6945  . piperacillin-tazobactam (ZOSYN) IVPB 3.375 g  3.375 g Intravenous Q8H Black, Lezlie Octave, NP   Stopped at 05/09/17 [phone]  . pravastatin (PRAVACHOL) tablet 40 mg  40 mg Oral q1800 Radene Gunning, NP   40 mg at 05/08/17 1811  . tamsulosin (FLOMAX) capsule 0.4 mg  0.4 mg Oral Daily Modena Jansky, MD   0.4 mg at 05/09/17 8280     Discharge Medications: Please see discharge summary for a list of discharge medications.  Relevant Imaging Results:  Relevant Lab Results:   Additional Information SS# [facility] [place] [place], [place]

## 2017-05-09 NOTE — Social Work (Addendum)
Clinical Social Worker facilitated patient discharge including contacting patient family and facility to confirm patient discharge plans.  Clinical information faxed to facility and family agreeable with plan.  CSW arranged ambulance transport via [facility] to [name] room 104.  RN to call [phone] with report for room 104 prior to discharge.  Clinical Social Worker will sign off for now as social work intervention is no longer needed. Please consult [place] again if new need arises.  Alexander Mt, [place] Social Worker

## 2017-05-09 NOTE — Social Work (Addendum)
Pt is able to return to ALF at [facility] today, await discharge summary. Ricky [name], [place] Work [phone]

## 2017-05-09 NOTE — Progress Notes (Signed)
Physical Therapy Treatment Patient Details Name: Ricky [name]. MRN: [id number] DOB: 27-Apr-1940 Today's Date: 05/09/2017    History of Present Illness 77 year old male, resident of [facility], wheelchair mobile per his report, PMH of dementia, depression, DM2, HLD, HTN, schizophrenia, presented to ED after staff at facility witnessed seizure which lasted 3-4 minutes.    PT Comments    Patient is progressing toward PT goals and able to laterally transfer EOB to recliner with min guard. Pt tolerated session well with no c/o dizziness. Current plan remains appropriate.    Follow Up Recommendations  SNF;Supervision/Assistance - 24 hour     Equipment Recommendations  None recommended by PT    Recommendations for Other Services       Precautions / Restrictions Precautions Precautions: Fall Restrictions Weight Bearing Restrictions: No    Mobility  Bed Mobility Overal bed mobility: Needs Assistance Bed Mobility: Supine to Sit     Supine to sit: Min guard     General bed mobility comments: HOB elevated; use of rails; min guard for safety  Transfers Overall transfer level: Needs assistance   Transfers: Lateral/Scoot Transfers          Lateral/Scoot Transfers: Min guard General transfer comment: min guard for safety; cues for sequencing and safety as pt began to scoot too far on EOB  Ambulation/Gait             General Gait Details: pt non ambulatory at baseline   Stairs             Wheelchair Mobility    Modified Rankin (Stroke Patients Only)       Balance Overall balance assessment: Needs assistance Sitting-balance support: Feet supported Sitting balance-Leahy Scale: Fair                                      Cognition Arousal/Alertness: Awake/alert Behavior During Therapy: WFL for tasks assessed/performed Overall Cognitive Status: No family/caregiver present to determine baseline cognitive functioning                                         Exercises General Exercises - Lower Extremity Long Arc Quad: AROM;Both;10 reps Toe Raises: AROM;Both;20 reps Heel Raises: AROM;Both;20 reps Other Exercises Other Exercises: chair push ups X10    General Comments        Pertinent Vitals/Pain Pain Assessment: Faces Faces Pain Scale: No hurt    Home Living                      Prior Function            PT Goals (current goals can now be found in the care plan section) Acute Rehab PT Goals Patient Stated Goal: to go back to ALF PT Goal Formulation: With patient Time For Goal Achievement: 05/21/17 Potential to Achieve Goals: Good Progress towards PT goals: Progressing toward goals    Frequency    Min 2X/week      PT Plan Current plan remains appropriate    Co-evaluation              AM-PAC PT "6 Clicks" Daily Activity  Outcome Measure  Difficulty turning over in bed (including adjusting bedclothes, sheets and blankets)?: Unable Difficulty moving from lying on back to sitting on the side of the  bed? : Unable Difficulty sitting down on and standing up from a chair with arms (e.g., wheelchair, bedside commode, etc,.)?: Unable Help needed moving to and from a bed to chair (including a wheelchair)?: A Little Help needed walking in hospital room?: Total Help needed climbing 3-5 steps with a railing? : Total 6 Click Score: 8    End of Session   Activity Tolerance: Patient tolerated treatment well Patient left: with call bell/phone within reach;in chair;with chair alarm set Nurse Communication: Mobility status PT Visit Diagnosis: Muscle weakness (generalized) (M62.81)     Time: 2633-3545 PT Time Calculation (min) (ACUTE ONLY): 20 min  Charges:  $Therapeutic Activity: 8-22 mins                    G Codes:       Earney Navy, PTA Pager: [phone]     Darliss Cheney 05/09/2017, 1:30 PM

## 2017-05-09 NOTE — Evaluation (Signed)
Clinical/Bedside Swallow Evaluation Patient Details  Name: Ricky [name]. MRN: [id number] Date of Birth: 1940/08/08  Today's Date: 05/09/2017 Time: SLP Start Time (ACUTE ONLY): 26 SLP Stop Time (ACUTE ONLY): 1036 SLP Time Calculation (min) (ACUTE ONLY): 31 min  Past Medical History:  Past Medical History:  Diagnosis Date  . Abnormality of gait   . Adenomatous colon polyp   . Arthritis   . Cataract   . Dementia   . Depression   . Diabetes mellitus without complication ([place])   . Fatty liver   . Hyperlipidemia   . Hypertension   . Internal hemorrhoids   . Other malaise and fatigue   . Peripheral edema   . Schizophrenia ([place])   . Type II or unspecified type diabetes mellitus without mention of complication, not stated as uncontrolled   . Urinary frequency   . Urinary retention    Past Surgical History:  Past Surgical History:  Procedure Laterality Date  . BALLOON DILATION N/A 05/22/2012   Procedure: BALLOON DILATION;  Surgeon: Beryle Beams, MD;  Location: [facility];  Service: Endoscopy;  Laterality: N/A;  . BALLOON DILATION N/A 06/09/2015   Procedure: BALLOON DILATION;  Surgeon: Carol Ada, MD;  Location: [facility];  Service: Endoscopy;  Laterality: N/A;  . ESOPHAGOGASTRODUODENOSCOPY  02/21/2012   Procedure: ESOPHAGOGASTRODUODENOSCOPY (EGD);  Surgeon: Beryle Beams, MD;  Location: [facility];  Service: Endoscopy;  Laterality: N/A;  . ESOPHAGOGASTRODUODENOSCOPY N/A 05/22/2012   Procedure: ESOPHAGOGASTRODUODENOSCOPY (EGD);  Surgeon: Beryle Beams, MD;  Location: [facility];  Service: Endoscopy;  Laterality: N/A;  . ESOPHAGOGASTRODUODENOSCOPY N/A 06/17/2014   Procedure: ESOPHAGOGASTRODUODENOSCOPY (EGD);  Surgeon: Carol Ada, MD;  Location: [facility];  Service: Endoscopy;  Laterality: N/A;  . ESOPHAGOGASTRODUODENOSCOPY N/A 08/10/2014   Procedure: ESOPHAGOGASTRODUODENOSCOPY (EGD);  Surgeon: Carol Ada, MD;  Location: [facility];  Service: Endoscopy;   Laterality: N/A;  . ESOPHAGOGASTRODUODENOSCOPY N/A 05/28/2015   Procedure: ESOPHAGOGASTRODUODENOSCOPY (EGD);  Surgeon: Gatha Mayer, MD;  Location: [facility];  Service: Endoscopy;  Laterality: N/A;  . ESOPHAGOGASTRODUODENOSCOPY (EGD) WITH PROPOFOL N/A 06/09/2015   Procedure: ESOPHAGOGASTRODUODENOSCOPY (EGD) WITH PROPOFOL;  Surgeon: Carol Ada, MD;  Location: [facility];  Service: Endoscopy;  Laterality: N/A;  . SAVORY DILATION N/A 06/17/2014   Procedure: SAVORY DILATION;  Surgeon: Carol Ada, MD;  Location: [facility];  Service: Endoscopy;  Laterality: N/A;  . TONSILLECTOMY     HPI:  Pt is a 77 year old male, resident of [facility] and wheelchair mobile per his report, who was admitted for sepsis due to PNA and witnessed seizure-like activity. PMH of dementia, depression, DM2, HLD, HTN, schizophrenia, and esophageal issues requiring dilations and at least two prior EGDs for removal of food boluses per pt report.    Assessment / Plan / Recommendation Clinical Impression  Pt's oropharyngeal swallow appears to be [facility] with timely oral preparation, seemingly swift swallow responses, and no overt s/s of aspiration. SLP reviewed general aspiration and esophageal precautions. No further f/u indicated at this time - would continue with current diet. SLP Visit Diagnosis: Dysphagia, unspecified (R13.10)    Aspiration Risk  Mild aspiration risk    Diet Recommendation Regular;Thin liquid   Liquid Administration via: Cup;Straw Medication Administration: Whole meds with liquid Supervision: Patient able to self feed;Intermittent supervision to cue for compensatory strategies Compensations: Slow rate;Small sips/bites;Follow solids with liquid Postural Changes: Seated upright at 90 degrees;Remain upright for at least 30 minutes after po intake    Other  Recommendations Oral Care Recommendations: Oral care BID  Follow up Recommendations None      Frequency and Duration             Prognosis        Swallow Study   General HPI: Pt is a 77 year old male, resident of [facility] and wheelchair mobile per his report, who was admitted for sepsis due to PNA and witnessed seizure-like activity. PMH of dementia, depression, DM2, HLD, HTN, schizophrenia, and esophageal issues requiring dilations and at least two prior EGDs for removal of food boluses per pt report.  Type of Study: Bedside Swallow Evaluation Previous Swallow Assessment: BSE 11/2012 [facility] recommending regular diet, thin liquids Diet Prior to this Study: Regular;Thin liquids Temperature Spikes Noted: No Respiratory Status: Room air History of Recent Intubation: No Behavior/Cognition: Alert;Cooperative;Pleasant mood Oral Cavity Assessment: Within Functional Limits Oral Care Completed by SLP: No Oral Cavity - Dentition: Adequate natural dentition Vision: Functional for self-feeding Self-Feeding Abilities: Able to feed self Patient Positioning: Upright in bed Baseline Vocal Quality: Normal Volitional Cough: Strong Volitional Swallow: Able to elicit    Oral/Motor/Sensory Function Overall Oral Motor/Sensory Function: Within functional limits   Ice Chips Ice chips: Not tested   Thin Liquid Thin Liquid: Within functional limits Presentation: Self Fed;Straw    Nectar Thick Nectar Thick Liquid: Not tested   Honey Thick Honey Thick Liquid: Not tested   Puree Puree: Within functional limits Presentation: Self Fed;Spoon   Solid   GO   Solid: Within functional limits Presentation: Self Ennis Forts 05/09/2017,10:48 AM  Germain Osgood, M.A. CCC-SLP [phone]

## 2017-05-09 NOTE — Care Management Important Message (Signed)
Important Message  Patient Details  Name: Ricky [name]. MRN: [id number] Date of Birth: September 21, 1940   Medicare Important Message Given:  Yes    Lometa Riggin 05/09/2017, 3:09 PM

## 2017-05-10 LAB — URINE CULTURE: Culture: >=100000 — AB

## 2017-05-11 DIAGNOSIS — M6281 Muscle weakness (generalized): Secondary | ICD-10-CM | POA: Diagnosis not present

## 2017-05-11 DIAGNOSIS — J9600 Acute respiratory failure, unspecified whether with hypoxia or hypercapnia: Secondary | ICD-10-CM | POA: Diagnosis not present

## 2017-05-11 LAB — CULTURE, BLOOD (ROUTINE X 2)
CULTURE: NO GROWTH
CULTURE: NO GROWTH
SPECIAL REQUESTS: ADEQUATE

## 2017-05-12 DIAGNOSIS — E039 Hypothyroidism, unspecified: Secondary | ICD-10-CM | POA: Diagnosis not present

## 2017-05-12 DIAGNOSIS — E030 Congenital hypothyroidism with diffuse goiter: Secondary | ICD-10-CM | POA: Diagnosis not present

## 2017-05-12 DIAGNOSIS — M6281 Muscle weakness (generalized): Secondary | ICD-10-CM | POA: Diagnosis not present

## 2017-05-12 DIAGNOSIS — E119 Type 2 diabetes mellitus without complications: Secondary | ICD-10-CM | POA: Diagnosis not present

## 2017-05-12 DIAGNOSIS — D649 Anemia, unspecified: Secondary | ICD-10-CM | POA: Diagnosis not present

## 2017-05-12 DIAGNOSIS — J9600 Acute respiratory failure, unspecified whether with hypoxia or hypercapnia: Secondary | ICD-10-CM | POA: Diagnosis not present

## 2017-05-12 DIAGNOSIS — E559 Vitamin D deficiency, unspecified: Secondary | ICD-10-CM | POA: Diagnosis not present

## 2017-05-12 DIAGNOSIS — E785 Hyperlipidemia, unspecified: Secondary | ICD-10-CM | POA: Diagnosis not present

## 2017-05-12 DIAGNOSIS — I10 Essential (primary) hypertension: Secondary | ICD-10-CM | POA: Diagnosis not present

## 2017-05-13 DIAGNOSIS — J9600 Acute respiratory failure, unspecified whether with hypoxia or hypercapnia: Secondary | ICD-10-CM | POA: Diagnosis not present

## 2017-05-13 DIAGNOSIS — M6281 Muscle weakness (generalized): Secondary | ICD-10-CM | POA: Diagnosis not present

## 2017-05-14 DIAGNOSIS — J189 Pneumonia, unspecified organism: Secondary | ICD-10-CM | POA: Diagnosis not present

## 2017-05-14 DIAGNOSIS — M6281 Muscle weakness (generalized): Secondary | ICD-10-CM | POA: Diagnosis not present

## 2017-05-14 DIAGNOSIS — J9600 Acute respiratory failure, unspecified whether with hypoxia or hypercapnia: Secondary | ICD-10-CM | POA: Diagnosis not present

## 2017-05-14 DIAGNOSIS — E0822 Diabetes mellitus due to underlying condition with diabetic chronic kidney disease: Secondary | ICD-10-CM | POA: Diagnosis not present

## 2017-05-14 DIAGNOSIS — I10 Essential (primary) hypertension: Secondary | ICD-10-CM | POA: Diagnosis not present

## 2017-05-15 DIAGNOSIS — J9600 Acute respiratory failure, unspecified whether with hypoxia or hypercapnia: Secondary | ICD-10-CM | POA: Diagnosis not present

## 2017-05-15 DIAGNOSIS — M6281 Muscle weakness (generalized): Secondary | ICD-10-CM | POA: Diagnosis not present

## 2017-05-18 DIAGNOSIS — J9600 Acute respiratory failure, unspecified whether with hypoxia or hypercapnia: Secondary | ICD-10-CM | POA: Diagnosis not present

## 2017-05-18 DIAGNOSIS — M6281 Muscle weakness (generalized): Secondary | ICD-10-CM | POA: Diagnosis not present

## 2017-05-19 DIAGNOSIS — M6281 Muscle weakness (generalized): Secondary | ICD-10-CM | POA: Diagnosis not present

## 2017-05-19 DIAGNOSIS — J9600 Acute respiratory failure, unspecified whether with hypoxia or hypercapnia: Secondary | ICD-10-CM | POA: Diagnosis not present

## 2017-05-20 DIAGNOSIS — M6281 Muscle weakness (generalized): Secondary | ICD-10-CM | POA: Diagnosis not present

## 2017-05-20 DIAGNOSIS — J9600 Acute respiratory failure, unspecified whether with hypoxia or hypercapnia: Secondary | ICD-10-CM | POA: Diagnosis not present

## 2017-05-21 DIAGNOSIS — M6281 Muscle weakness (generalized): Secondary | ICD-10-CM | POA: Diagnosis not present

## 2017-05-22 DIAGNOSIS — E119 Type 2 diabetes mellitus without complications: Secondary | ICD-10-CM | POA: Diagnosis not present

## 2017-05-22 DIAGNOSIS — J189 Pneumonia, unspecified organism: Secondary | ICD-10-CM | POA: Diagnosis not present

## 2017-05-22 DIAGNOSIS — J9601 Acute respiratory failure with hypoxia: Secondary | ICD-10-CM | POA: Diagnosis not present

## 2017-06-01 DIAGNOSIS — M6281 Muscle weakness (generalized): Secondary | ICD-10-CM | POA: Diagnosis not present

## 2017-06-04 DIAGNOSIS — H40013 Open angle with borderline findings, low risk, bilateral: Secondary | ICD-10-CM | POA: Diagnosis not present

## 2017-06-04 DIAGNOSIS — H43393 Other vitreous opacities, bilateral: Secondary | ICD-10-CM | POA: Diagnosis not present

## 2017-06-04 DIAGNOSIS — H35033 Hypertensive retinopathy, bilateral: Secondary | ICD-10-CM | POA: Diagnosis not present

## 2017-06-04 DIAGNOSIS — E119 Type 2 diabetes mellitus without complications: Secondary | ICD-10-CM | POA: Diagnosis not present

## 2017-06-11 ENCOUNTER — Ambulatory Visit: Payer: Medicare Other | Admitting: Podiatry

## 2017-06-11 ENCOUNTER — Encounter: Payer: Self-pay | Admitting: Podiatry

## 2017-06-11 DIAGNOSIS — B351 Tinea unguium: Secondary | ICD-10-CM | POA: Diagnosis not present

## 2017-06-11 DIAGNOSIS — M79609 Pain in unspecified limb: Secondary | ICD-10-CM | POA: Diagnosis not present

## 2017-06-11 DIAGNOSIS — E119 Type 2 diabetes mellitus without complications: Secondary | ICD-10-CM | POA: Diagnosis not present

## 2017-06-11 NOTE — Progress Notes (Signed)
Subjective:     Patient ID: Ricky [name]., male   DOB: October 01, 1940, 77 y.o.   MRN: [id number]  HPIThis patient presents to the office for preventive foot care services.  His nails have grown long thick and ingrown on second and third toes both feet.  He is diabetic with no complications   Review of Systems     Objective:   Physical Exam GENERAL APPEARANCE: Alert, conversant. Appropriately groomed. No acute distress.  VASCULAR: Pedal pulses palpable at  [facility] and PT bilateral.  Capillary refill time is immediate to all digits,  Normal temperature gradient.  Digital hair growth is present bilateral  NEUROLOGIC: sensation is normal to 5.07 monofilament at 5/5 sites bilateral.  Light touch is intact bilateral, Muscle strength normal.  MUSCULOSKELETAL: acceptable muscle strength, tone and stability bilateral.  Intrinsic muscluature intact bilateral.  Rectus appearance of foot and digits noted bilateral. Hallux malleus B/L  DERMATOLOGIC: skin color, texture, and turgor are within normal limits.  No preulcerative lesions or ulcers  are seen, no interdigital maceration noted.  No open lesions present.  . No drainage noted. Healing skin lesion right hallux.  Hammer toes  B/L NAILS  Thick disfigured discolored nails both feet with no drainage or infection.      Assessment:     Onychomycosis     Plan:     Debridement of Nails both feet.  RTC 10 weeks.     Gardiner Barefoot DPM

## 2017-06-13 DIAGNOSIS — E119 Type 2 diabetes mellitus without complications: Secondary | ICD-10-CM | POA: Diagnosis not present

## 2017-06-13 DIAGNOSIS — J189 Pneumonia, unspecified organism: Secondary | ICD-10-CM | POA: Diagnosis not present

## 2017-06-13 DIAGNOSIS — I10 Essential (primary) hypertension: Secondary | ICD-10-CM | POA: Diagnosis not present

## 2017-06-14 DIAGNOSIS — R509 Fever, unspecified: Secondary | ICD-10-CM | POA: Diagnosis not present

## 2017-06-14 DIAGNOSIS — D649 Anemia, unspecified: Secondary | ICD-10-CM | POA: Diagnosis not present

## 2017-06-14 DIAGNOSIS — Z79899 Other long term (current) drug therapy: Secondary | ICD-10-CM | POA: Diagnosis not present

## 2017-06-15 DIAGNOSIS — E708 Other disorders of aromatic amino-acid metabolism: Secondary | ICD-10-CM | POA: Diagnosis not present

## 2017-06-15 DIAGNOSIS — I10 Essential (primary) hypertension: Secondary | ICD-10-CM | POA: Diagnosis not present

## 2017-06-16 DIAGNOSIS — N390 Urinary tract infection, site not specified: Secondary | ICD-10-CM | POA: Diagnosis not present

## 2017-06-16 DIAGNOSIS — Z79899 Other long term (current) drug therapy: Secondary | ICD-10-CM | POA: Diagnosis not present

## 2017-07-02 DIAGNOSIS — M6281 Muscle weakness (generalized): Secondary | ICD-10-CM | POA: Diagnosis not present

## 2017-07-03 DIAGNOSIS — I10 Essential (primary) hypertension: Secondary | ICD-10-CM | POA: Diagnosis not present

## 2017-07-03 DIAGNOSIS — E782 Mixed hyperlipidemia: Secondary | ICD-10-CM | POA: Diagnosis not present

## 2017-07-03 DIAGNOSIS — E118 Type 2 diabetes mellitus with unspecified complications: Secondary | ICD-10-CM | POA: Diagnosis not present

## 2017-07-23 DIAGNOSIS — L89322 Pressure ulcer of left buttock, stage 2: Secondary | ICD-10-CM | POA: Diagnosis not present

## 2017-07-23 DIAGNOSIS — L89312 Pressure ulcer of right buttock, stage 2: Secondary | ICD-10-CM | POA: Diagnosis not present

## 2017-07-30 DIAGNOSIS — I10 Essential (primary) hypertension: Secondary | ICD-10-CM | POA: Diagnosis not present

## 2017-07-30 DIAGNOSIS — L89322 Pressure ulcer of left buttock, stage 2: Secondary | ICD-10-CM | POA: Diagnosis not present

## 2017-07-30 DIAGNOSIS — E0822 Diabetes mellitus due to underlying condition with diabetic chronic kidney disease: Secondary | ICD-10-CM | POA: Diagnosis not present

## 2017-07-30 DIAGNOSIS — N182 Chronic kidney disease, stage 2 (mild): Secondary | ICD-10-CM | POA: Diagnosis not present

## 2017-08-06 DIAGNOSIS — L89322 Pressure ulcer of left buttock, stage 2: Secondary | ICD-10-CM | POA: Diagnosis not present

## 2017-08-11 DIAGNOSIS — K148 Other diseases of tongue: Secondary | ICD-10-CM | POA: Diagnosis not present

## 2017-08-11 DIAGNOSIS — E119 Type 2 diabetes mellitus without complications: Secondary | ICD-10-CM | POA: Diagnosis not present

## 2017-08-11 DIAGNOSIS — I10 Essential (primary) hypertension: Secondary | ICD-10-CM | POA: Diagnosis not present

## 2017-08-13 DIAGNOSIS — L89322 Pressure ulcer of left buttock, stage 2: Secondary | ICD-10-CM | POA: Diagnosis not present

## 2017-08-20 ENCOUNTER — Encounter: Payer: Self-pay | Admitting: Podiatry

## 2017-08-20 ENCOUNTER — Ambulatory Visit: Payer: Medicare Other | Admitting: Podiatry

## 2017-08-20 DIAGNOSIS — M79676 Pain in unspecified toe(s): Secondary | ICD-10-CM

## 2017-08-20 DIAGNOSIS — E119 Type 2 diabetes mellitus without complications: Secondary | ICD-10-CM

## 2017-08-20 DIAGNOSIS — L89322 Pressure ulcer of left buttock, stage 2: Secondary | ICD-10-CM | POA: Diagnosis not present

## 2017-08-20 DIAGNOSIS — M2041 Other hammer toe(s) (acquired), right foot: Secondary | ICD-10-CM

## 2017-08-20 DIAGNOSIS — B351 Tinea unguium: Secondary | ICD-10-CM

## 2017-08-20 DIAGNOSIS — M2042 Other hammer toe(s) (acquired), left foot: Secondary | ICD-10-CM

## 2017-08-20 DIAGNOSIS — M79609 Pain in unspecified limb: Principal | ICD-10-CM

## 2017-08-20 NOTE — Progress Notes (Signed)
Subjective:     Patient ID: Ricky [name]., male   DOB: Dec 24, 1940, 77 y.o.   MRN: [id number]  HPIThis patient presents to the office for preventive foot care services.  His nails have grown long thick and ingrown on second and third toes both feet.  He is diabetic with no complications   Review of Systems     Objective:   Physical Exam GENERAL APPEARANCE: Alert, conversant. Appropriately groomed. No acute distress.  VASCULAR: Pedal pulses palpable at  [facility] and PT bilateral.  Capillary refill time is immediate to all digits,  Normal temperature gradient.   NEUROLOGIC: sensation is normal to 5.07 monofilament at 5/5 sites bilateral.  Light touch is intact bilateral, Muscle strength normal.  MUSCULOSKELETAL: acceptable muscle strength, tone and stability bilateral.  Intrinsic muscluature intact bilateral.  Hammer toes  B/L. Hallux malleus B/L  DERMATOLOGIC: skin color, texture, and turgor are within normal limits.  No preulcerative lesions or ulcers  are seen, no interdigital maceration noted.  No open lesions present.  . No drainage noted. Healing skin lesion right hallux.  Hammer toes  B/L NAILS  Thick disfigured discolored nails both feet with no drainage or infection.      Assessment:     Onychomycosis     Plan:     Debridement of Nails both feet.  RTC 10 weeks.     Gardiner Barefoot DPM

## 2017-08-27 DIAGNOSIS — L89322 Pressure ulcer of left buttock, stage 2: Secondary | ICD-10-CM | POA: Diagnosis not present

## 2017-08-29 DIAGNOSIS — Z79899 Other long term (current) drug therapy: Secondary | ICD-10-CM | POA: Diagnosis not present

## 2017-09-01 DIAGNOSIS — K1370 Unspecified lesions of oral mucosa: Secondary | ICD-10-CM | POA: Diagnosis not present

## 2017-09-03 DIAGNOSIS — L89322 Pressure ulcer of left buttock, stage 2: Secondary | ICD-10-CM | POA: Diagnosis not present

## 2017-09-10 DIAGNOSIS — K0381 Cracked tooth: Secondary | ICD-10-CM | POA: Diagnosis not present

## 2017-09-10 DIAGNOSIS — L89322 Pressure ulcer of left buttock, stage 2: Secondary | ICD-10-CM | POA: Diagnosis not present

## 2017-09-17 DIAGNOSIS — L89322 Pressure ulcer of left buttock, stage 2: Secondary | ICD-10-CM | POA: Diagnosis not present

## 2017-09-24 DIAGNOSIS — I10 Essential (primary) hypertension: Secondary | ICD-10-CM | POA: Diagnosis not present

## 2017-09-24 DIAGNOSIS — E119 Type 2 diabetes mellitus without complications: Secondary | ICD-10-CM | POA: Diagnosis not present

## 2017-09-24 DIAGNOSIS — L89322 Pressure ulcer of left buttock, stage 2: Secondary | ICD-10-CM | POA: Diagnosis not present

## 2017-09-24 DIAGNOSIS — I8310 Varicose veins of unspecified lower extremity with inflammation: Secondary | ICD-10-CM | POA: Diagnosis not present

## 2017-10-01 DIAGNOSIS — E118 Type 2 diabetes mellitus with unspecified complications: Secondary | ICD-10-CM | POA: Diagnosis not present

## 2017-10-01 DIAGNOSIS — L89312 Pressure ulcer of right buttock, stage 2: Secondary | ICD-10-CM | POA: Diagnosis not present

## 2017-10-01 DIAGNOSIS — L89322 Pressure ulcer of left buttock, stage 2: Secondary | ICD-10-CM | POA: Diagnosis not present

## 2017-10-03 DIAGNOSIS — I10 Essential (primary) hypertension: Secondary | ICD-10-CM | POA: Diagnosis not present

## 2017-10-03 DIAGNOSIS — E118 Type 2 diabetes mellitus with unspecified complications: Secondary | ICD-10-CM | POA: Diagnosis not present

## 2017-10-08 DIAGNOSIS — L89322 Pressure ulcer of left buttock, stage 2: Secondary | ICD-10-CM | POA: Diagnosis not present

## 2017-10-08 DIAGNOSIS — L89312 Pressure ulcer of right buttock, stage 2: Secondary | ICD-10-CM | POA: Diagnosis not present

## 2017-10-10 DIAGNOSIS — E119 Type 2 diabetes mellitus without complications: Secondary | ICD-10-CM | POA: Diagnosis not present

## 2017-10-10 DIAGNOSIS — I10 Essential (primary) hypertension: Secondary | ICD-10-CM | POA: Diagnosis not present

## 2017-10-10 DIAGNOSIS — R0602 Shortness of breath: Secondary | ICD-10-CM | POA: Diagnosis not present

## 2017-10-11 DIAGNOSIS — R0602 Shortness of breath: Secondary | ICD-10-CM | POA: Diagnosis not present

## 2017-10-11 DIAGNOSIS — D649 Anemia, unspecified: Secondary | ICD-10-CM | POA: Diagnosis not present

## 2017-10-11 DIAGNOSIS — Z79899 Other long term (current) drug therapy: Secondary | ICD-10-CM | POA: Diagnosis not present

## 2017-10-14 DIAGNOSIS — L89322 Pressure ulcer of left buttock, stage 2: Secondary | ICD-10-CM | POA: Diagnosis not present

## 2017-10-14 DIAGNOSIS — L89312 Pressure ulcer of right buttock, stage 2: Secondary | ICD-10-CM | POA: Diagnosis not present

## 2017-10-16 DIAGNOSIS — E11621 Type 2 diabetes mellitus with foot ulcer: Secondary | ICD-10-CM | POA: Diagnosis not present

## 2017-10-16 DIAGNOSIS — L89322 Pressure ulcer of left buttock, stage 2: Secondary | ICD-10-CM | POA: Diagnosis not present

## 2017-10-16 DIAGNOSIS — L89312 Pressure ulcer of right buttock, stage 2: Secondary | ICD-10-CM | POA: Diagnosis not present

## 2017-10-16 DIAGNOSIS — L03116 Cellulitis of left lower limb: Secondary | ICD-10-CM | POA: Diagnosis not present

## 2017-10-22 DIAGNOSIS — L89312 Pressure ulcer of right buttock, stage 2: Secondary | ICD-10-CM | POA: Diagnosis not present

## 2017-10-22 DIAGNOSIS — L89322 Pressure ulcer of left buttock, stage 2: Secondary | ICD-10-CM | POA: Diagnosis not present

## 2017-10-24 ENCOUNTER — Encounter: Payer: Self-pay | Admitting: Podiatry

## 2017-10-24 ENCOUNTER — Telehealth: Payer: Self-pay | Admitting: *Deleted

## 2017-10-24 ENCOUNTER — Encounter: Payer: Self-pay | Admitting: Behavioral Health

## 2017-10-24 ENCOUNTER — Ambulatory Visit: Payer: Medicare Other | Admitting: Podiatry

## 2017-10-24 DIAGNOSIS — B351 Tinea unguium: Secondary | ICD-10-CM

## 2017-10-24 DIAGNOSIS — M79609 Pain in unspecified limb: Secondary | ICD-10-CM

## 2017-10-24 DIAGNOSIS — L97511 Non-pressure chronic ulcer of other part of right foot limited to breakdown of skin: Secondary | ICD-10-CM

## 2017-10-24 DIAGNOSIS — F429 Obsessive-compulsive disorder, unspecified: Secondary | ICD-10-CM | POA: Insufficient documentation

## 2017-10-24 DIAGNOSIS — M2041 Other hammer toe(s) (acquired), right foot: Secondary | ICD-10-CM

## 2017-10-24 DIAGNOSIS — G3184 Mild cognitive impairment, so stated: Secondary | ICD-10-CM | POA: Insufficient documentation

## 2017-10-24 DIAGNOSIS — E119 Type 2 diabetes mellitus without complications: Secondary | ICD-10-CM

## 2017-10-24 DIAGNOSIS — M2042 Other hammer toe(s) (acquired), left foot: Secondary | ICD-10-CM

## 2017-10-24 DIAGNOSIS — L97521 Non-pressure chronic ulcer of other part of left foot limited to breakdown of skin: Secondary | ICD-10-CM

## 2017-10-24 NOTE — Telephone Encounter (Signed)
Called and spoke with Mr.Marley(emerg. Contact), was given another number for nursing center([name]). [phone]. Called and the nurse was not available.Dr. Prudence Davidson wanted to  Inform center's nurse of patient's skin lesions  on  Left  great toe,distal 3rd left,2nd toe right.He has tried to  Contact 2 times.

## 2017-10-24 NOTE — Telephone Encounter (Signed)
I called Johny Sax transferred the call multiple times, and the line would disconnect. I called [facility] and spoke with Mariann Laster and asked if it would be better to fax the orders ATTN: Milinda Antis stated fax to [phone]. Dr. Prudence Davidson states pt has abrasions to B/L 1st medial toes, left 3rd distal toe, and right 2nd toe due to his shoes being too small, please perform daily for 2 weeks cleanses with hydrogen peroxide, cover each area with light coating of neosporin and dry sterile dressing and have pt wear looser shoe, pt is to return to the office in 2 weeks. Orders faxed to [facility] Frederickson to [phone] and [phone].

## 2017-10-27 ENCOUNTER — Encounter: Payer: Self-pay | Admitting: Podiatry

## 2017-10-27 NOTE — Progress Notes (Signed)
Complaint:  Visit Type: Patient returns to my office for continued preventative foot care services. Complaint: Patient states" my nails have grown long and thick and become painful to walk and wear shoes" Patient has been diagnosed with DM with no foot complications. . The patient presents for preventative foot care services.  Upon my nail care it was noted he had skin lesions on multiple toes .  He tallks about purchasing a new pair of shoes and applying his powerstep insoles in the shoes. He says he knew about the skin lesions but was unaware of the reason the skin lesions developed.  They have bandaged the big toes both feet.  He presents to the office for preventative foot care services.   No changes to ROS  Podiatric Exam: Vascular: dorsalis pedis and posterior tibial pulses are palpable bilateral. Capillary return is immediate. Temperature gradient is WNL. Skin turgor WNL  Swelling noted both feet. Sensorium: Diminished  Semmes Weinstein monofilament test. Diminished  tactile sensation bilaterally. Nail Exam: Pt has thick disfigured discolored nails with subungual debris noted bilateral entire nail hallux through fifth toenails Ulcer Exam: There are skin lesions through his skin in 1 left and 1,2 right foot.  He has a distal ulcer digit left foot. Orthopedic Exam: Muscle tone and strength are WNL. No limitations in general ROM. No crepitus or effusions noted. Foot type and digits show no abnormalities. Bony prominences are unremarkable. Skin: No Porokeratosis. No infection or ulcers  Diagnosis:  Onychomycosis, , Pain in right toe, pain in left toes,  Diabetic ulcers  Digits  B/L.  Treatment & Plan Procedures and Treatment: Consent by patient was obtained for treatment procedures.   Debridement of mycotic and hypertrophic toenails, 1 through 5 bilateral and clearing of subungual debris. No ulceration, no infection noted.  Debridement of necrotic tissue covering her skin lesions  1 B/L and 2  right.  Distal ulcer with no infection digit left foot.  Neosporin/DSD.  Patient was told to stop wearing his shoes and was dispensed surgical shoe covers to wear.  Patient was also to wash feet with H2O2 washes.  He was told not to wear shoes  RTC 2 weeks since he is diabetic and We need to keep our eyes on these ulcers. Marcy Siren will call the instructions to the nursing home. Return Visit-Office Procedure: Patient instructed to return to the office for a follow up visit 3 months for continued evaluation and treatment.    Gardiner Barefoot DPM

## 2017-10-29 DIAGNOSIS — L89322 Pressure ulcer of left buttock, stage 2: Secondary | ICD-10-CM | POA: Diagnosis not present

## 2017-10-29 DIAGNOSIS — L89312 Pressure ulcer of right buttock, stage 2: Secondary | ICD-10-CM | POA: Diagnosis not present

## 2017-11-05 DIAGNOSIS — L89312 Pressure ulcer of right buttock, stage 2: Secondary | ICD-10-CM | POA: Diagnosis not present

## 2017-11-05 DIAGNOSIS — L89322 Pressure ulcer of left buttock, stage 2: Secondary | ICD-10-CM | POA: Diagnosis not present

## 2017-11-07 ENCOUNTER — Ambulatory Visit: Payer: Medicare Other | Admitting: Podiatry

## 2017-11-07 ENCOUNTER — Ambulatory Visit: Payer: Medicare Other | Admitting: Psychiatry

## 2017-11-07 ENCOUNTER — Encounter: Payer: Self-pay | Admitting: Podiatry

## 2017-11-07 DIAGNOSIS — L97521 Non-pressure chronic ulcer of other part of left foot limited to breakdown of skin: Secondary | ICD-10-CM | POA: Diagnosis not present

## 2017-11-10 DIAGNOSIS — L89322 Pressure ulcer of left buttock, stage 2: Secondary | ICD-10-CM | POA: Diagnosis not present

## 2017-11-10 NOTE — Progress Notes (Signed)
Subjective:   Patient ID: Ricky [name]., male   DOB: 77 y.o.   MRN: [id number]   HPI Patient presents with chronic breakdown of tissue with superficial ulceration left hallux that is treated at the nursing home but requires debridement at times   ROS      Objective:  Physical Exam  No change neurovascular status with patient having lesion plantar aspect left hallux that upon debridement shows a approximate 7 x 7 mm opening with 2 mm of depth with no subcutaneous exposure and no proximal edema erythema or drainage noted     Assessment:  Superficial chronic ulceration left hallux secondary to pressure     Plan:  Sharp sterile debridement of lesion accomplished flush the area out did not note any drainage at the current time and applied Iodosorb with sterile dressing and instructed on continued home wound care and if any redness swelling or drainage were to occur to contact us immediately

## 2017-11-12 DIAGNOSIS — L89312 Pressure ulcer of right buttock, stage 2: Secondary | ICD-10-CM | POA: Diagnosis not present

## 2017-11-12 DIAGNOSIS — L89322 Pressure ulcer of left buttock, stage 2: Secondary | ICD-10-CM | POA: Diagnosis not present

## 2017-11-19 DIAGNOSIS — R9430 Abnormal result of cardiovascular function study, unspecified: Secondary | ICD-10-CM | POA: Diagnosis not present

## 2017-11-19 DIAGNOSIS — L89312 Pressure ulcer of right buttock, stage 2: Secondary | ICD-10-CM | POA: Diagnosis not present

## 2017-11-19 DIAGNOSIS — E119 Type 2 diabetes mellitus without complications: Secondary | ICD-10-CM | POA: Diagnosis not present

## 2017-11-19 DIAGNOSIS — N182 Chronic kidney disease, stage 2 (mild): Secondary | ICD-10-CM | POA: Diagnosis not present

## 2017-11-19 DIAGNOSIS — I10 Essential (primary) hypertension: Secondary | ICD-10-CM | POA: Diagnosis not present

## 2017-11-19 DIAGNOSIS — L89322 Pressure ulcer of left buttock, stage 2: Secondary | ICD-10-CM | POA: Diagnosis not present

## 2017-11-19 DIAGNOSIS — L97522 Non-pressure chronic ulcer of other part of left foot with fat layer exposed: Secondary | ICD-10-CM | POA: Diagnosis not present

## 2017-11-24 ENCOUNTER — Ambulatory Visit: Payer: Medicare Other | Admitting: Psychiatry

## 2017-11-24 ENCOUNTER — Encounter (HOSPITAL_BASED_OUTPATIENT_CLINIC_OR_DEPARTMENT_OTHER): Payer: Medicare Other | Attending: Internal Medicine

## 2017-11-24 DIAGNOSIS — E1140 Type 2 diabetes mellitus with diabetic neuropathy, unspecified: Secondary | ICD-10-CM | POA: Insufficient documentation

## 2017-11-24 DIAGNOSIS — L89323 Pressure ulcer of left buttock, stage 3: Secondary | ICD-10-CM | POA: Insufficient documentation

## 2017-11-24 DIAGNOSIS — I10 Essential (primary) hypertension: Secondary | ICD-10-CM | POA: Diagnosis not present

## 2017-11-24 DIAGNOSIS — E11621 Type 2 diabetes mellitus with foot ulcer: Secondary | ICD-10-CM | POA: Diagnosis not present

## 2017-11-24 DIAGNOSIS — Z7984 Long term (current) use of oral hypoglycemic drugs: Secondary | ICD-10-CM | POA: Insufficient documentation

## 2017-11-24 DIAGNOSIS — L97511 Non-pressure chronic ulcer of other part of right foot limited to breakdown of skin: Secondary | ICD-10-CM | POA: Insufficient documentation

## 2017-11-24 DIAGNOSIS — L97521 Non-pressure chronic ulcer of other part of left foot limited to breakdown of skin: Secondary | ICD-10-CM | POA: Insufficient documentation

## 2017-11-26 DIAGNOSIS — L89312 Pressure ulcer of right buttock, stage 2: Secondary | ICD-10-CM | POA: Diagnosis not present

## 2017-11-26 DIAGNOSIS — L97522 Non-pressure chronic ulcer of other part of left foot with fat layer exposed: Secondary | ICD-10-CM | POA: Diagnosis not present

## 2017-11-26 DIAGNOSIS — L89322 Pressure ulcer of left buttock, stage 2: Secondary | ICD-10-CM | POA: Diagnosis not present

## 2017-11-28 DIAGNOSIS — E11621 Type 2 diabetes mellitus with foot ulcer: Secondary | ICD-10-CM | POA: Diagnosis not present

## 2017-11-28 DIAGNOSIS — R600 Localized edema: Secondary | ICD-10-CM | POA: Diagnosis not present

## 2017-11-28 DIAGNOSIS — M25462 Effusion, left knee: Secondary | ICD-10-CM | POA: Diagnosis not present

## 2017-11-28 DIAGNOSIS — I10 Essential (primary) hypertension: Secondary | ICD-10-CM | POA: Diagnosis not present

## 2017-11-29 DIAGNOSIS — M25562 Pain in left knee: Secondary | ICD-10-CM | POA: Diagnosis not present

## 2017-12-03 DIAGNOSIS — H43393 Other vitreous opacities, bilateral: Secondary | ICD-10-CM | POA: Diagnosis not present

## 2017-12-03 DIAGNOSIS — H35033 Hypertensive retinopathy, bilateral: Secondary | ICD-10-CM | POA: Diagnosis not present

## 2017-12-03 DIAGNOSIS — M6281 Muscle weakness (generalized): Secondary | ICD-10-CM | POA: Diagnosis not present

## 2017-12-03 DIAGNOSIS — L89300 Pressure ulcer of unspecified buttock, unstageable: Secondary | ICD-10-CM | POA: Diagnosis not present

## 2017-12-03 DIAGNOSIS — H40013 Open angle with borderline findings, low risk, bilateral: Secondary | ICD-10-CM | POA: Diagnosis not present

## 2017-12-03 DIAGNOSIS — M1990 Unspecified osteoarthritis, unspecified site: Secondary | ICD-10-CM | POA: Diagnosis not present

## 2017-12-03 DIAGNOSIS — L97522 Non-pressure chronic ulcer of other part of left foot with fat layer exposed: Secondary | ICD-10-CM | POA: Diagnosis not present

## 2017-12-03 DIAGNOSIS — L89322 Pressure ulcer of left buttock, stage 2: Secondary | ICD-10-CM | POA: Diagnosis not present

## 2017-12-03 DIAGNOSIS — B9562 Methicillin resistant Staphylococcus aureus infection as the cause of diseases classified elsewhere: Secondary | ICD-10-CM | POA: Diagnosis not present

## 2017-12-03 DIAGNOSIS — E119 Type 2 diabetes mellitus without complications: Secondary | ICD-10-CM | POA: Diagnosis not present

## 2017-12-04 DIAGNOSIS — B9562 Methicillin resistant Staphylococcus aureus infection as the cause of diseases classified elsewhere: Secondary | ICD-10-CM | POA: Diagnosis not present

## 2017-12-05 DIAGNOSIS — B9562 Methicillin resistant Staphylococcus aureus infection as the cause of diseases classified elsewhere: Secondary | ICD-10-CM | POA: Diagnosis not present

## 2017-12-08 DIAGNOSIS — B9562 Methicillin resistant Staphylococcus aureus infection as the cause of diseases classified elsewhere: Secondary | ICD-10-CM | POA: Diagnosis not present

## 2017-12-09 ENCOUNTER — Ambulatory Visit: Payer: Medicare Other | Admitting: Podiatry

## 2017-12-09 ENCOUNTER — Encounter: Payer: Self-pay | Admitting: Podiatry

## 2017-12-09 DIAGNOSIS — L97511 Non-pressure chronic ulcer of other part of right foot limited to breakdown of skin: Secondary | ICD-10-CM | POA: Diagnosis not present

## 2017-12-09 DIAGNOSIS — B9562 Methicillin resistant Staphylococcus aureus infection as the cause of diseases classified elsewhere: Secondary | ICD-10-CM | POA: Diagnosis not present

## 2017-12-09 DIAGNOSIS — L97521 Non-pressure chronic ulcer of other part of left foot limited to breakdown of skin: Secondary | ICD-10-CM | POA: Diagnosis not present

## 2017-12-09 DIAGNOSIS — E119 Type 2 diabetes mellitus without complications: Secondary | ICD-10-CM

## 2017-12-09 NOTE — Progress Notes (Signed)
This patient presents to the office to be evaluated for ulcers which developed on 10/04.  He says the feet have been treated by a wound care provider at his home.  He presents to the office today for continued evaluation and treatment of his multiple ulcers.  He was seen by Dr.  Paulla Dolly for treatment on 10/18.  He presents to the office today saying his feet are much better.  He presents with [name] for evaluation.  General Appearance  Alert, conversant and in no acute stress.  Vascular  Dorsalis pedis and posterior tibial  pulses are palpable  bilaterally.  Capillary return is within normal limits  bilaterally. Temperature is within normal limits  Bilaterally. Swelling both feet.  Neurologic  Senn-Weinstein monofilament wire test diminished   bilaterally. Muscle power within normal limits bilaterally.  Nails Thick disfigured discolored nails with subungual debris  from hallux to fifth toes bilaterally. No evidence of bacterial infection or drainage bilaterally.  Orthopedic  No limitations of motion  feet .  No crepitus or effusions noted.  No bony pathology or digital deformities noted.  Skin  normotropic skin with no porokeratosis noted bilaterally.  Persistant ulcers hallux  B/L. No infection noted. Ulcers measure 2 x 2 mm.   Healing ulcers hallux  B/L    Debride ulcers.  Neosporin/DSD.  Told he can bandage with band-aid.  RTC December for preventative foot care services.   Gardiner Barefoot DPM

## 2017-12-10 DIAGNOSIS — B9562 Methicillin resistant Staphylococcus aureus infection as the cause of diseases classified elsewhere: Secondary | ICD-10-CM | POA: Diagnosis not present

## 2017-12-10 DIAGNOSIS — L89322 Pressure ulcer of left buttock, stage 2: Secondary | ICD-10-CM | POA: Diagnosis not present

## 2017-12-10 DIAGNOSIS — L97522 Non-pressure chronic ulcer of other part of left foot with fat layer exposed: Secondary | ICD-10-CM | POA: Diagnosis not present

## 2017-12-11 DIAGNOSIS — B9562 Methicillin resistant Staphylococcus aureus infection as the cause of diseases classified elsewhere: Secondary | ICD-10-CM | POA: Diagnosis not present

## 2017-12-12 DIAGNOSIS — B9562 Methicillin resistant Staphylococcus aureus infection as the cause of diseases classified elsewhere: Secondary | ICD-10-CM | POA: Diagnosis not present

## 2017-12-15 DIAGNOSIS — B9562 Methicillin resistant Staphylococcus aureus infection as the cause of diseases classified elsewhere: Secondary | ICD-10-CM | POA: Diagnosis not present

## 2017-12-16 DIAGNOSIS — B9562 Methicillin resistant Staphylococcus aureus infection as the cause of diseases classified elsewhere: Secondary | ICD-10-CM | POA: Diagnosis not present

## 2017-12-17 DIAGNOSIS — L89322 Pressure ulcer of left buttock, stage 2: Secondary | ICD-10-CM | POA: Diagnosis not present

## 2017-12-17 DIAGNOSIS — L97522 Non-pressure chronic ulcer of other part of left foot with fat layer exposed: Secondary | ICD-10-CM | POA: Diagnosis not present

## 2017-12-22 ENCOUNTER — Other Ambulatory Visit: Payer: Medicare Other | Admitting: Orthotics

## 2017-12-24 DIAGNOSIS — L97522 Non-pressure chronic ulcer of other part of left foot with fat layer exposed: Secondary | ICD-10-CM | POA: Diagnosis not present

## 2017-12-24 DIAGNOSIS — L89322 Pressure ulcer of left buttock, stage 2: Secondary | ICD-10-CM | POA: Diagnosis not present

## 2017-12-31 DIAGNOSIS — L97522 Non-pressure chronic ulcer of other part of left foot with fat layer exposed: Secondary | ICD-10-CM | POA: Diagnosis not present

## 2017-12-31 DIAGNOSIS — L89323 Pressure ulcer of left buttock, stage 3: Secondary | ICD-10-CM | POA: Diagnosis not present

## 2018-01-07 DIAGNOSIS — E118 Type 2 diabetes mellitus with unspecified complications: Secondary | ICD-10-CM | POA: Diagnosis not present

## 2018-01-07 DIAGNOSIS — L97522 Non-pressure chronic ulcer of other part of left foot with fat layer exposed: Secondary | ICD-10-CM | POA: Diagnosis not present

## 2018-01-07 DIAGNOSIS — L89323 Pressure ulcer of left buttock, stage 3: Secondary | ICD-10-CM | POA: Diagnosis not present

## 2018-01-07 DIAGNOSIS — E782 Mixed hyperlipidemia: Secondary | ICD-10-CM | POA: Diagnosis not present

## 2018-01-09 ENCOUNTER — Ambulatory Visit: Payer: Medicare Other | Admitting: Podiatry

## 2018-01-09 ENCOUNTER — Encounter: Payer: Self-pay | Admitting: Podiatry

## 2018-01-09 DIAGNOSIS — E119 Type 2 diabetes mellitus without complications: Secondary | ICD-10-CM | POA: Diagnosis not present

## 2018-01-09 DIAGNOSIS — M79609 Pain in unspecified limb: Secondary | ICD-10-CM

## 2018-01-09 DIAGNOSIS — M2041 Other hammer toe(s) (acquired), right foot: Secondary | ICD-10-CM | POA: Diagnosis not present

## 2018-01-09 DIAGNOSIS — M2010 Hallux valgus (acquired), unspecified foot: Secondary | ICD-10-CM | POA: Diagnosis not present

## 2018-01-09 DIAGNOSIS — B351 Tinea unguium: Secondary | ICD-10-CM | POA: Diagnosis not present

## 2018-01-09 DIAGNOSIS — M2042 Other hammer toe(s) (acquired), left foot: Secondary | ICD-10-CM

## 2018-01-09 NOTE — Progress Notes (Signed)
Complaint:  Visit Type: Patient returns to my office for continued preventative foot care services. Complaint: Patient states" my nails have grown long and thick and become painful to walk and wear shoes" Patient has been diagnosed with DM with no foot complications. . The patient presents for preventative foot care services.  Patient says the skin lesion on his left big toe is bandaged and does not wish the bandage removed.  Patient has callus right hallux with no bandage.   He presents to the office for preventative foot care services.   No changes to ROS.  Patient says his shoes have been stretched.    Podiatric Exam: Vascular: dorsalis pedis and posterior tibial pulses are palpable bilateral. Capillary return is immediate. Temperature gradient is WNL. Skin turgor WNL  Swelling noted both feet. Sensorium: Diminished  Semmes Weinstein monofilament test. Diminished  tactile sensation bilaterally. Nail Exam: Pt has thick disfigured discolored nails with subungual debris noted entire nail hallux through fifth toenails right foot and multiple nails left foot. Ulcer Exam: Right hallux is bandaged and covered.  The pinch callus right hallux is covered with necrotic tissue and callus. Orthopedic Exam: Muscle tone and strength are WNL. No limitations in general ROM. No crepitus or effusions noted. Foot type and digits show no abnormalities. Bony prominences are unremarkable. Skin: No Porokeratosis. No infection or ulcers  Diagnosis:  Onychomycosis, , Pain in right toe, pain in left toes,  Diabetic ulcers  Digits  B/L.  Treatment & Plan Procedures and Treatment: Consent by patient was obtained for treatment procedures.   Debridement of mycotic and hypertrophic toenails, 1 through 5 bilateral and clearing of subungual debris.  Debridement of necrotic tissue covering her skin lesions 1 right.. Neosporin/DSD.  RTC 10 weeks for nail care.  Patient to continue for ulcer treatment by the home.     Return  Visit-Office Procedure: Patient instructed to return to the office for a follow up visit 3 months for continued evaluation and treatment.    Gardiner Barefoot DPM

## 2018-01-12 DIAGNOSIS — Z0000 Encounter for general adult medical examination without abnormal findings: Secondary | ICD-10-CM | POA: Diagnosis not present

## 2018-01-12 DIAGNOSIS — E118 Type 2 diabetes mellitus with unspecified complications: Secondary | ICD-10-CM | POA: Diagnosis not present

## 2018-01-12 DIAGNOSIS — E782 Mixed hyperlipidemia: Secondary | ICD-10-CM | POA: Diagnosis not present

## 2018-01-12 DIAGNOSIS — I10 Essential (primary) hypertension: Secondary | ICD-10-CM | POA: Diagnosis not present

## 2018-01-12 DIAGNOSIS — E1144 Type 2 diabetes mellitus with diabetic amyotrophy: Secondary | ICD-10-CM | POA: Diagnosis not present

## 2018-01-15 DIAGNOSIS — L89323 Pressure ulcer of left buttock, stage 3: Secondary | ICD-10-CM | POA: Diagnosis not present

## 2018-01-15 DIAGNOSIS — L97522 Non-pressure chronic ulcer of other part of left foot with fat layer exposed: Secondary | ICD-10-CM | POA: Diagnosis not present

## 2018-01-16 ENCOUNTER — Inpatient Hospital Stay (HOSPITAL_COMMUNITY)
Admission: EM | Admit: 2018-01-16 | Discharge: 2018-01-23 | DRG: 871 | Disposition: A | Discharge status: Skilled Nursing Facility | Payer: Medicare Other | Attending: Family Medicine | Admitting: Family Medicine

## 2018-01-16 ENCOUNTER — Encounter (HOSPITAL_COMMUNITY): Payer: Self-pay

## 2018-01-16 ENCOUNTER — Emergency Department (HOSPITAL_COMMUNITY): Payer: Medicare Other

## 2018-01-16 DIAGNOSIS — E1122 Type 2 diabetes mellitus with diabetic chronic kidney disease: Secondary | ICD-10-CM | POA: Diagnosis not present

## 2018-01-16 DIAGNOSIS — R509 Fever, unspecified: Secondary | ICD-10-CM | POA: Diagnosis not present

## 2018-01-16 DIAGNOSIS — Z86010 Personal history of colonic polyps: Secondary | ICD-10-CM

## 2018-01-16 DIAGNOSIS — E785 Hyperlipidemia, unspecified: Secondary | ICD-10-CM | POA: Diagnosis not present

## 2018-01-16 DIAGNOSIS — I10 Essential (primary) hypertension: Secondary | ICD-10-CM | POA: Diagnosis not present

## 2018-01-16 DIAGNOSIS — I129 Hypertensive chronic kidney disease with stage 1 through stage 4 chronic kidney disease, or unspecified chronic kidney disease: Secondary | ICD-10-CM | POA: Diagnosis not present

## 2018-01-16 DIAGNOSIS — I340 Nonrheumatic mitral (valve) insufficiency: Secondary | ICD-10-CM | POA: Diagnosis not present

## 2018-01-16 DIAGNOSIS — J9601 Acute respiratory failure with hypoxia: Secondary | ICD-10-CM | POA: Diagnosis present

## 2018-01-16 DIAGNOSIS — B9562 Methicillin resistant Staphylococcus aureus infection as the cause of diseases classified elsewhere: Secondary | ICD-10-CM | POA: Diagnosis not present

## 2018-01-16 DIAGNOSIS — L03116 Cellulitis of left lower limb: Secondary | ICD-10-CM | POA: Diagnosis present

## 2018-01-16 DIAGNOSIS — A4102 Sepsis due to Methicillin resistant Staphylococcus aureus: Secondary | ICD-10-CM | POA: Diagnosis not present

## 2018-01-16 DIAGNOSIS — L539 Erythematous condition, unspecified: Secondary | ICD-10-CM | POA: Diagnosis not present

## 2018-01-16 DIAGNOSIS — I361 Nonrheumatic tricuspid (valve) insufficiency: Secondary | ICD-10-CM | POA: Diagnosis not present

## 2018-01-16 DIAGNOSIS — K219 Gastro-esophageal reflux disease without esophagitis: Secondary | ICD-10-CM | POA: Diagnosis not present

## 2018-01-16 DIAGNOSIS — K760 Fatty (change of) liver, not elsewhere classified: Secondary | ICD-10-CM | POA: Diagnosis not present

## 2018-01-16 DIAGNOSIS — Z885 Allergy status to narcotic agent status: Secondary | ICD-10-CM | POA: Diagnosis not present

## 2018-01-16 DIAGNOSIS — F32A Depression, unspecified: Secondary | ICD-10-CM | POA: Diagnosis present

## 2018-01-16 DIAGNOSIS — E11621 Type 2 diabetes mellitus with foot ulcer: Secondary | ICD-10-CM | POA: Diagnosis present

## 2018-01-16 DIAGNOSIS — Z808 Family history of malignant neoplasm of other organs or systems: Secondary | ICD-10-CM | POA: Diagnosis not present

## 2018-01-16 DIAGNOSIS — Z886 Allergy status to analgesic agent status: Secondary | ICD-10-CM

## 2018-01-16 DIAGNOSIS — Z794 Long term (current) use of insulin: Secondary | ICD-10-CM | POA: Diagnosis not present

## 2018-01-16 DIAGNOSIS — L89313 Pressure ulcer of right buttock, stage 3: Secondary | ICD-10-CM | POA: Diagnosis present

## 2018-01-16 DIAGNOSIS — I4891 Unspecified atrial fibrillation: Secondary | ICD-10-CM | POA: Diagnosis not present

## 2018-01-16 DIAGNOSIS — R52 Pain, unspecified: Secondary | ICD-10-CM

## 2018-01-16 DIAGNOSIS — F0390 Unspecified dementia without behavioral disturbance: Secondary | ICD-10-CM | POA: Diagnosis present

## 2018-01-16 DIAGNOSIS — N183 Chronic kidney disease, stage 3 (moderate): Secondary | ICD-10-CM | POA: Diagnosis not present

## 2018-01-16 DIAGNOSIS — L97509 Non-pressure chronic ulcer of other part of unspecified foot with unspecified severity: Secondary | ICD-10-CM | POA: Diagnosis present

## 2018-01-16 DIAGNOSIS — E1151 Type 2 diabetes mellitus with diabetic peripheral angiopathy without gangrene: Secondary | ICD-10-CM | POA: Diagnosis present

## 2018-01-16 DIAGNOSIS — E119 Type 2 diabetes mellitus without complications: Secondary | ICD-10-CM

## 2018-01-16 DIAGNOSIS — B351 Tinea unguium: Secondary | ICD-10-CM | POA: Diagnosis not present

## 2018-01-16 DIAGNOSIS — R2689 Other abnormalities of gait and mobility: Secondary | ICD-10-CM | POA: Diagnosis not present

## 2018-01-16 DIAGNOSIS — R531 Weakness: Secondary | ICD-10-CM | POA: Diagnosis not present

## 2018-01-16 DIAGNOSIS — L89323 Pressure ulcer of left buttock, stage 3: Secondary | ICD-10-CM

## 2018-01-16 DIAGNOSIS — Z66 Do not resuscitate: Secondary | ICD-10-CM | POA: Diagnosis present

## 2018-01-16 DIAGNOSIS — F329 Major depressive disorder, single episode, unspecified: Secondary | ICD-10-CM | POA: Diagnosis present

## 2018-01-16 DIAGNOSIS — L89322 Pressure ulcer of left buttock, stage 2: Secondary | ICD-10-CM | POA: Diagnosis not present

## 2018-01-16 DIAGNOSIS — I440 Atrioventricular block, first degree: Secondary | ICD-10-CM | POA: Diagnosis not present

## 2018-01-16 DIAGNOSIS — A419 Sepsis, unspecified organism: Secondary | ICD-10-CM | POA: Diagnosis present

## 2018-01-16 DIAGNOSIS — R278 Other lack of coordination: Secondary | ICD-10-CM | POA: Diagnosis not present

## 2018-01-16 DIAGNOSIS — F209 Schizophrenia, unspecified: Secondary | ICD-10-CM | POA: Diagnosis present

## 2018-01-16 DIAGNOSIS — M2550 Pain in unspecified joint: Secondary | ICD-10-CM | POA: Diagnosis not present

## 2018-01-16 DIAGNOSIS — R7881 Bacteremia: Secondary | ICD-10-CM

## 2018-01-16 DIAGNOSIS — Z7401 Bed confinement status: Secondary | ICD-10-CM | POA: Diagnosis not present

## 2018-01-16 DIAGNOSIS — J90 Pleural effusion, not elsewhere classified: Secondary | ICD-10-CM | POA: Diagnosis not present

## 2018-01-16 DIAGNOSIS — I959 Hypotension, unspecified: Secondary | ICD-10-CM | POA: Diagnosis not present

## 2018-01-16 DIAGNOSIS — S0990XA Unspecified injury of head, initial encounter: Secondary | ICD-10-CM | POA: Diagnosis not present

## 2018-01-16 DIAGNOSIS — F429 Obsessive-compulsive disorder, unspecified: Secondary | ICD-10-CM | POA: Diagnosis present

## 2018-01-16 DIAGNOSIS — S199XXA Unspecified injury of neck, initial encounter: Secondary | ICD-10-CM | POA: Diagnosis not present

## 2018-01-16 DIAGNOSIS — R000 Tachycardia, unspecified: Secondary | ICD-10-CM | POA: Diagnosis not present

## 2018-01-16 DIAGNOSIS — R0689 Other abnormalities of breathing: Secondary | ICD-10-CM | POA: Diagnosis not present

## 2018-01-16 DIAGNOSIS — S4992XA Unspecified injury of left shoulder and upper arm, initial encounter: Secondary | ICD-10-CM | POA: Diagnosis not present

## 2018-01-16 DIAGNOSIS — M25512 Pain in left shoulder: Secondary | ICD-10-CM | POA: Diagnosis not present

## 2018-01-16 DIAGNOSIS — F419 Anxiety disorder, unspecified: Secondary | ICD-10-CM | POA: Diagnosis present

## 2018-01-16 LAB — PROTIME-INR
INR: 0.94
Prothrombin Time: 12.5 seconds (ref 11.4–15.2)

## 2018-01-16 LAB — CBC WITH DIFFERENTIAL/PLATELET
ABS IMMATURE GRANULOCYTES: 0.13 10*3/uL — AB (ref 0.00–0.07)
BASOS PCT: 0 %
Basophils Absolute: 0.1 10*3/uL (ref 0.0–0.1)
EOS ABS: 0 10*3/uL (ref 0.0–0.5)
Eosinophils Relative: 0 %
HCT: 42.7 % (ref 39.0–52.0)
Hemoglobin: 13.3 g/dL (ref 13.0–17.0)
IMMATURE GRANULOCYTES: 1 %
Lymphocytes Relative: 2 %
Lymphs Abs: 0.5 10*3/uL — ABNORMAL LOW (ref 0.7–4.0)
MCH: 28.5 pg (ref 26.0–34.0)
MCHC: 31.1 g/dL (ref 30.0–36.0)
MCV: 91.6 fL (ref 80.0–100.0)
MONO ABS: 1.5 10*3/uL — AB (ref 0.1–1.0)
MONOS PCT: 7 %
NEUTROS ABS: 20.6 10*3/uL — AB (ref 1.7–7.7)
Neutrophils Relative %: 90 %
PLATELETS: 259 10*3/uL (ref 150–400)
RBC: 4.66 MIL/uL (ref 4.22–5.81)
RDW: 14.5 % (ref 11.5–15.5)
WBC: 22.9 10*3/uL — ABNORMAL HIGH (ref 4.0–10.5)
nRBC: 0 % (ref 0.0–0.2)

## 2018-01-16 LAB — URINALYSIS, ROUTINE W REFLEX MICROSCOPIC
BILIRUBIN URINE: NEGATIVE
Glucose, UA: >=500 mg/dL — AB
HGB URINE DIPSTICK: NEGATIVE
KETONES UR: NEGATIVE mg/dL
Leukocytes, UA: NEGATIVE
NITRITE: NEGATIVE
Protein, ur: NEGATIVE mg/dL
SPECIFIC GRAVITY, URINE: 1.013 (ref 1.005–1.030)
pH: 6 (ref 5.0–8.0)

## 2018-01-16 LAB — COMPREHENSIVE METABOLIC PANEL
ALT: 16 U/L (ref 0–44)
AST: 31 U/L (ref 15–41)
Albumin: 4.1 g/dL (ref 3.5–5.0)
Alkaline Phosphatase: 94 U/L (ref 38–126)
Anion gap: 14 (ref 5–15)
BUN: 21 mg/dL (ref 8–23)
CO2: 26 mmol/L (ref 22–32)
CREATININE: 1.33 mg/dL — AB (ref 0.61–1.24)
Calcium: 9.1 mg/dL (ref 8.9–10.3)
Chloride: 97 mmol/L — ABNORMAL LOW (ref 98–111)
GFR calc non Af Amer: 51 mL/min — ABNORMAL LOW (ref >60)
GFR, EST AFRICAN AMERICAN: 59 mL/min — AB (ref >60)
Glucose, Bld: 167 mg/dL — ABNORMAL HIGH (ref 70–99)
Potassium: 3.8 mmol/L (ref 3.5–5.1)
Sodium: 137 mmol/L (ref 135–145)
Total Bilirubin: 1.2 mg/dL (ref 0.3–1.2)
Total Protein: 8.2 g/dL — ABNORMAL HIGH (ref 6.5–8.1)

## 2018-01-16 LAB — I-STAT TROPONIN, ED: Troponin i, poc: 0.04 ng/mL (ref 0.00–0.08)

## 2018-01-16 LAB — I-STAT CG4 LACTIC ACID, ED
LACTIC ACID, VENOUS: 3.45 mmol/L — AB (ref 0.5–1.9)
Lactic Acid, Venous: 2.09 mmol/L (ref 0.5–1.9)

## 2018-01-16 MED ORDER — HEPARIN SODIUM (PORCINE) 5000 UNIT/ML IJ SOLN
5000.0000 [IU] | Freq: Three times a day (TID) | INTRAMUSCULAR | Status: DC
  Administered 2018-01-17 – 2018-01-23 (×21): 5000 [IU] via SUBCUTANEOUS
  Filled 2018-01-16 (×21): qty 1

## 2018-01-16 MED ORDER — INSULIN ASPART 100 UNIT/ML ~~LOC~~ SOLN
0.0000 [IU] | Freq: Three times a day (TID) | SUBCUTANEOUS | Status: DC
  Administered 2018-01-17 (×2): 1 [IU] via SUBCUTANEOUS
  Administered 2018-01-18: 3 [IU] via SUBCUTANEOUS
  Administered 2018-01-18: 1 [IU] via SUBCUTANEOUS
  Administered 2018-01-19: 3 [IU] via SUBCUTANEOUS
  Administered 2018-01-19 – 2018-01-20 (×2): 2 [IU] via SUBCUTANEOUS
  Administered 2018-01-20: 1 [IU] via SUBCUTANEOUS
  Administered 2018-01-21: 2 [IU] via SUBCUTANEOUS
  Administered 2018-01-21: 3 [IU] via SUBCUTANEOUS
  Administered 2018-01-22: 2 [IU] via SUBCUTANEOUS
  Administered 2018-01-22: 3 [IU] via SUBCUTANEOUS
  Administered 2018-01-22: 2 [IU] via SUBCUTANEOUS
  Administered 2018-01-23: 3 [IU] via SUBCUTANEOUS
  Administered 2018-01-23 (×2): 2 [IU] via SUBCUTANEOUS

## 2018-01-16 MED ORDER — AMLODIPINE BESYLATE 5 MG PO TABS: 5.0000 mg | ORAL_TABLET | Freq: Every day | ORAL | Status: DC

## 2018-01-16 MED ORDER — SODIUM CHLORIDE 0.9 % IV BOLUS (SEPSIS)
1000.0000 mL | Freq: Once | INTRAVENOUS | Status: AC
  Administered 2018-01-16: 1000 mL via INTRAVENOUS

## 2018-01-16 MED ORDER — OLANZAPINE 5 MG PO TABS
25.0000 mg | ORAL_TABLET | Freq: Every day | ORAL | Status: DC
  Administered 2018-01-17 – 2018-01-22 (×7): 25 mg via ORAL
  Filled 2018-01-16 (×2): qty 1
  Filled 2018-01-16: qty 5
  Filled 2018-01-16: qty 1
  Filled 2018-01-16 (×2): qty 5
  Filled 2018-01-16: qty 1

## 2018-01-16 MED ORDER — VANCOMYCIN HCL IN DEXTROSE 1-5 GM/200ML-% IV SOLN
1000.0000 mg | Freq: Once | INTRAVENOUS | Status: AC
  Administered 2018-01-16: 1000 mg via INTRAVENOUS
  Filled 2018-01-16: qty 200

## 2018-01-16 MED ORDER — TAMSULOSIN HCL 0.4 MG PO CAPS: 0.4000 mg | ORAL_CAPSULE | Freq: Every day | ORAL | Status: DC

## 2018-01-16 MED ORDER — INSULIN DETEMIR 100 UNIT/ML ~~LOC~~ SOLN
30.0000 [IU] | Freq: Every day | SUBCUTANEOUS | Status: DC
  Administered 2018-01-17 – 2018-01-22 (×7): 30 [IU] via SUBCUTANEOUS
  Filled 2018-01-16 (×8): qty 0.3

## 2018-01-16 MED ORDER — ONDANSETRON HCL 4 MG PO TABS: 4.0000 mg | ORAL_TABLET | Freq: Four times a day (QID) | ORAL | Status: DC | PRN

## 2018-01-16 MED ORDER — SODIUM CHLORIDE 0.9 % IV SOLN
2.0000 g | Freq: Once | INTRAVENOUS | Status: AC
  Administered 2018-01-16: 2 g via INTRAVENOUS
  Filled 2018-01-16: qty 2

## 2018-01-16 MED ORDER — HYDROXYZINE HCL 25 MG PO TABS: 25.0000 mg | ORAL_TABLET | Freq: Every evening | ORAL | Status: DC | PRN

## 2018-01-16 MED ORDER — ONDANSETRON HCL 4 MG/2ML IJ SOLN: 4.0000 mg | Freq: Four times a day (QID) | INTRAMUSCULAR | Status: DC | PRN

## 2018-01-16 MED ORDER — PRAVASTATIN SODIUM 40 MG PO TABS
40.0000 mg | ORAL_TABLET | Freq: Every day | ORAL | Status: DC
  Administered 2018-01-17 – 2018-01-23 (×6): 40 mg via ORAL
  Filled 2018-01-16: qty 2
  Filled 2018-01-16: qty 1
  Filled 2018-01-16 (×2): qty 2
  Filled 2018-01-16 (×2): qty 1

## 2018-01-16 MED ORDER — FLUVOXAMINE MALEATE 50 MG PO TABS
100.0000 mg | ORAL_TABLET | Freq: Every day | ORAL | Status: DC
  Administered 2018-01-17 – 2018-01-22 (×7): 100 mg via ORAL
  Filled 2018-01-16 (×9): qty 2

## 2018-01-16 MED ORDER — INSULIN ASPART 100 UNIT/ML ~~LOC~~ SOLN
0.0000 [IU] | Freq: Every day | SUBCUTANEOUS | Status: DC
  Administered 2018-01-18: 2 [IU] via SUBCUTANEOUS

## 2018-01-16 MED ORDER — DOCUSATE SODIUM 100 MG PO CAPS
100.0000 mg | ORAL_CAPSULE | Freq: Two times a day (BID) | ORAL | Status: DC
  Administered 2018-01-17 – 2018-01-23 (×13): 100 mg via ORAL
  Filled 2018-01-16 (×13): qty 1

## 2018-01-16 MED ORDER — ALPRAZOLAM 0.5 MG PO TABS
0.5000 mg | ORAL_TABLET | Freq: Every day | ORAL | Status: DC
  Administered 2018-01-17 – 2018-01-22 (×7): 0.5 mg via ORAL
  Filled 2018-01-16 (×7): qty 1

## 2018-01-16 MED ORDER — PANTOPRAZOLE SODIUM 40 MG PO TBEC
40.0000 mg | DELAYED_RELEASE_TABLET | Freq: Every day | ORAL | Status: DC
  Administered 2018-01-17 – 2018-01-23 (×6): 40 mg via ORAL
  Filled 2018-01-16 (×6): qty 1

## 2018-01-16 MED ORDER — METRONIDAZOLE IN NACL 5-0.79 MG/ML-% IV SOLN
500.0000 mg | Freq: Three times a day (TID) | INTRAVENOUS | Status: DC
  Administered 2018-01-16 – 2018-01-18 (×6): 500 mg via INTRAVENOUS
  Filled 2018-01-16 (×6): qty 100

## 2018-01-16 MED ORDER — SODIUM CHLORIDE 0.9% FLUSH
3.0000 mL | Freq: Two times a day (BID) | INTRAVENOUS | Status: DC
  Administered 2018-01-17 – 2018-01-23 (×8): 3 mL via INTRAVENOUS

## 2018-01-16 NOTE — Progress Notes (Signed)
A consult was received from an ED physician for Vancomycin & Cefepime per pharmacy dosing.  The patient's profile has been reviewed for ht/wt/allergies/indication/available labs.    A one time order has been placed for Vancomycin 1gm, Cefepime 2gm.  Further antibiotics/pharmacy consults should be ordered by admitting physician if indicated.      Note: no admission weight, will need a current weight if Vancomycin is continued                   Thank you,  Minda Ditto 01/16/2018  7:15 PM

## 2018-01-16 NOTE — ED Triage Notes (Signed)
Pt arrived via EMS from [facility].Pt is alert and oriented  Per EMS reports that he had a  Fa   ll in the bathroom, staff assisted pt up. Per facility pt has low BP, and high HR. Per facility temp was 101.1 pt was given 650 mg of tylenol at facility. Per EMS pt urine had a strong foul odor. Pt O2 sat at 85% on RA, pt placed on 2 lpm via [place] and O2 improved to 92%, per EMS.  18G left forearm and received NS enroute,  89/60 HR 140, 85% RA , CBG 207,

## 2018-01-16 NOTE — ED Notes (Signed)
Patient transported to X-ray 

## 2018-01-16 NOTE — ED Notes (Signed)
Bed: SF68 Expected date:  Expected time:  Means of arrival:  Comments: EMS-possible UTI-room 23

## 2018-01-16 NOTE — ED Notes (Signed)
ED TO INPATIENT HANDOFF REPORT  Name/Age/Gender Ricky [name]. 77 y.o. male  Code Status    Code Status Orders  (From admission, onward)         Start     Ordered   01/16/18 2338  Do not attempt resuscitation (DNR)  Continuous    Question Answer Comment  In the event of cardiac or respiratory ARREST Do not call a "code blue"   In the event of cardiac or respiratory ARREST Do not perform Intubation, CPR, defibrillation or ACLS   In the event of cardiac or respiratory ARREST Use medication by any route, position, wound care, and other measures to relive pain and suffering. May use oxygen, suction and manual treatment of airway obstruction as needed for comfort.      01/16/18 2344        Code Status History    Date Active Date Inactive Code Status Order ID Comments User Context   05/06/2017 1614 05/09/2017 2322 DNR [id number]  Radene Gunning, NP ED   05/06/2017 1424 05/06/2017 1613 DNR [id number]  Lajean Saver, MD ED   12/10/2012 1552 12/14/2012 2200 DNR [id number]  Melton Alar, PA-C Inpatient    Advance Directive Documentation     Most Recent Value  Type of Advance Directive  Out of facility DNR (pink MOST or yellow form)  Pre-existing out of facility DNR order (yellow form or pink MOST form)  Yellow form placed in chart (order not valid for inpatient use)  "MOST" Form in Place?  -      Home/SNF/Other Nursing Home  Chief Complaint Fever; Weakness  Level of Care/Admitting Diagnosis ED Disposition    ED Disposition Condition [place]: [place] [[id number]]  Level of Care: Telemetry [5]  Admit to tele based on following criteria: Other see comments  Comments: hypotension  Diagnosis: Sepsis [facility]) [[id number]]  Admitting Physician: Lenore Cordia [[id number]]  Attending Physician: Lenore Cordia [[id number]]  Estimated length of stay: past midnight tomorrow  Certification:: I certify this patient will need inpatient services for at  least 2 midnights  PT Class (Do Not Modify): Inpatient [101]  PT Acc Code (Do Not Modify): Private [1]       Medical History Past Medical History:  Diagnosis Date  . Abnormality of gait   . Adenomatous colon polyp   . Arthritis   . Cataract   . Dementia ([place])   . Depression   . Diabetes mellitus without complication ([place])   . Fatty liver   . Hyperlipidemia   . Hypertension   . Internal hemorrhoids   . Other malaise and fatigue   . Peripheral edema   . Schizophrenia ([place])   . Type II or unspecified type diabetes mellitus without mention of complication, not stated as uncontrolled   . Urinary frequency   . Urinary retention     Allergies Allergies  Allergen Reactions  . Asa [Aspirin]     "Dr told me not to take it"  . Codeine Other (See Comments)    DIZZINESS with Tylenol 3  . Tylenol [Acetaminophen] Other (See Comments)    Dizziness with tylenol 3    IV Location/Drains/Wounds Patient Lines/Drains/Airways Status   Active Line/Drains/Airways    Name:   Placement date:   Placement time:   Site:   Days:   Peripheral IV 05/06/17 Left Hand   05/06/17    1350    Hand   255   Peripheral  IV 05/06/17 Right Forearm   05/06/17    1408    Forearm   255   Peripheral IV 01/16/18 Left Forearm   01/16/18    2002    Forearm   less than 1   Peripheral IV 01/16/18 Left;Upper Arm   01/16/18    2003    Arm   less than 1   Pressure Injury 05/06/17 Stage II -  Partial thickness loss of dermis presenting as a shallow open ulcer with a red, pink wound bed without slough. dry open red   05/06/17    2200     255   Pressure Injury 05/07/17 Stage II -  Partial thickness loss of dermis presenting as a shallow open ulcer with a red, pink wound bed without slough.   05/07/17    0830     254          Labs/Imaging Results for orders placed or performed during the hospital encounter of 01/16/18 (from the past 48 hour(s))  Comprehensive metabolic panel     Status: Abnormal   Collection Time:  01/16/18  7:04 PM  Result Value Ref Range   Sodium 137 135 - 145 mmol/L   Potassium 3.8 3.5 - 5.1 mmol/L   Chloride 97 (L) 98 - 111 mmol/L   CO2 26 22 - 32 mmol/L   Glucose, Bld 167 (H) 70 - 99 mg/dL   BUN 21 8 - 23 mg/dL   Creatinine, Ser 1.33 (H) 0.61 - 1.24 mg/dL   Calcium 9.1 8.9 - 10.3 mg/dL   Total Protein 8.2 (H) 6.5 - 8.1 g/dL   Albumin 4.1 3.5 - 5.0 g/dL   AST 31 15 - 41 U/L   ALT 16 0 - 44 U/L   Alkaline Phosphatase 94 38 - 126 U/L   Total Bilirubin 1.2 0.3 - 1.2 mg/dL   GFR calc non Af Amer 51 (L) >60 mL/min   GFR calc Af Amer 59 (L) >60 mL/min   Anion gap 14 5 - 15    Comment: Performed at [facility], [place] [address], [place], [place] [address]  CBC with Differential     Status: Abnormal   Collection Time: 01/16/18  7:04 PM  Result Value Ref Range   WBC 22.9 (H) 4.0 - 10.5 K/uL   RBC 4.66 4.22 - 5.81 MIL/uL   Hemoglobin 13.3 13.0 - 17.0 g/dL   HCT 42.7 39.0 - 52.0 %   MCV 91.6 80.0 - 100.0 fL   MCH 28.5 26.0 - 34.0 pg   MCHC 31.1 30.0 - 36.0 g/dL   RDW 14.5 11.5 - 15.5 %   Platelets 259 150 - 400 K/uL   nRBC 0.0 0.0 - 0.2 %   Neutrophils Relative % 90 %   Neutro Abs 20.6 (H) 1.7 - 7.7 K/uL   Lymphocytes Relative 2 %   Lymphs Abs 0.5 (L) 0.7 - 4.0 K/uL   Monocytes Relative 7 %   Monocytes Absolute 1.5 (H) 0.1 - 1.0 K/uL   Eosinophils Relative 0 %   Eosinophils Absolute 0.0 0.0 - 0.5 K/uL   Basophils Relative 0 %   Basophils Absolute 0.1 0.0 - 0.1 K/uL   Immature Granulocytes 1 %   Abs Immature Granulocytes 0.13 (H) 0.00 - 0.07 K/uL    Comment: Performed at [facility], [place] [address], [place], [place] [address]  Protime-INR     Status: None   Collection Time: 01/16/18  7:04 PM  Result  Value Ref Range   Prothrombin Time 12.5 11.4 - 15.2 seconds   INR 0.94     Comment: Performed at [facility], [place] [address], [place], [place] [address]  I-Stat CG4 Lactic Acid, ED     Status: Abnormal    Collection Time: 01/16/18  7:11 PM  Result Value Ref Range   Lactic Acid, Venous 3.45 (HH) 0.5 - 1.9 mmol/L   Comment NOTIFIED PHYSICIAN   I-stat troponin, ED (not at [facility]     Status: None   Collection Time: 01/16/18  9:16 PM  Result Value Ref Range   Troponin i, poc 0.04 0.00 - 0.08 ng/mL   Comment 3            Comment: Due to the release kinetics of cTnI, a negative result within the first hours of the onset of symptoms does not rule out myocardial infarction with certainty. If myocardial infarction is still suspected, repeat the test at appropriate intervals.   I-Stat CG4 Lactic Acid, ED     Status: Abnormal   Collection Time: 01/16/18  9:18 PM  Result Value Ref Range   Lactic Acid, Venous 2.09 (HH) 0.5 - 1.9 mmol/L   Comment NOTIFIED PHYSICIAN   Urinalysis, Routine w reflex microscopic     Status: Abnormal   Collection Time: 01/16/18  9:43 PM  Result Value Ref Range   Color, Urine YELLOW YELLOW   APPearance CLEAR CLEAR   Specific Gravity, Urine 1.013 1.005 - 1.030   pH 6.0 5.0 - 8.0   Glucose, UA >=500 (A) NEGATIVE mg/dL   Hgb urine dipstick NEGATIVE NEGATIVE   Bilirubin Urine NEGATIVE NEGATIVE   Ketones, ur NEGATIVE NEGATIVE mg/dL   Protein, ur NEGATIVE NEGATIVE mg/dL   Nitrite NEGATIVE NEGATIVE   Leukocytes, UA NEGATIVE NEGATIVE   RBC / HPF 0-5 0 - 5 RBC/hpf   WBC, UA 0-5 0 - 5 WBC/hpf   Bacteria, UA FEW (A) NONE SEEN   Squamous Epithelial / LPF 0-5 0 - 5    Comment: Performed at [facility], [place] [address], [place], [place] [address]   Dg Chest 2 View  Result Date: 01/16/2018 CLINICAL DATA:  Fever with low O2 saturation EXAM: CHEST - 2 VIEW COMPARISON:  May 08, 2017 FINDINGS: The heart size and mediastinal contours are stable. Mild central pulmonary vessel prominence is noted. There is minimal left pleural effusion. There is no focal pneumonia. Minimal posterior pleural effusions are noted. The visualized skeletal structures are stable.  IMPRESSION: No focal pneumonia. Mild central pulmonary vascular congestion. Minimal posterior pleural effusions. Electronically Signed   By: Abelardo Diesel M.D.   On: 01/16/2018 19:50   Ct Head Wo Contrast  Result Date: 01/16/2018 CLINICAL DATA:  Patient fell in bathroom and nursing facility. Fever. EXAM: CT HEAD WITHOUT CONTRAST CT CERVICAL SPINE WITHOUT CONTRAST TECHNIQUE: Multidetector CT imaging of the head and cervical spine was performed following the standard protocol without intravenous contrast. Multiplanar CT image reconstructions of the cervical spine were also generated. COMPARISON:  And CT 12/10/2012 and MRI 05/09/2017 FINDINGS: CT HEAD FINDINGS Brain: Stable age related involutional changes of brain with chronic small vessel ischemic disease. Negative for acute large vascular territory infarct, hemorrhage, intra-axial mass nor extra-axial fluid collections. No edema or midline shift. Vascular: No hyperdense vessel sign. Atherosclerosis of the skull base. Skull: Intact without skull fracture. No suspicious osseous lesions. Sinuses/Orbits: Bilateral cataract extractions. Intact orbits and globes. Minimal ethmoid sinus mucosal thickening. Other: Clear mastoids.  CT CERVICAL SPINE FINDINGS Alignment: Maintained cervical lordosis. Skull base and vertebrae: Motion related artifacts across the C6-7 interspace limit assessment. Intact skull base. Intact atlantodental interval and craniocervical relationship. No acute cervical spine fracture. Soft tissues and spinal canal: No prevertebral fluid or swelling. No visible canal hematoma. Disc levels: There is mild disc space narrowing from C2 through C6 and moderate at C6-7 through T1-T2. Minimal anterolisthesis grade 1 of C5 and C6 is identified. There is uncovertebral joint osteoarthritis and spurring greatest at C6-7 bilaterally. Bilateral multilevel degenerative facet arthropathy is seen. Upper chest: Clear lung apices. Other: Moderate extracranial carotid  arteriosclerosis. IMPRESSION: 1. Chronic small vessel ischemic disease of periventricular white matter. No acute intracranial abnormality. 2. Cervical spondylosis without acute cervical spine fracture. Electronically Signed   By: Ashley Royalty M.D.   On: 01/16/2018 19:55   Ct Cervical Spine Wo Contrast  Result Date: 01/16/2018 CLINICAL DATA:  Patient fell in bathroom and nursing facility. Fever. EXAM: CT HEAD WITHOUT CONTRAST CT CERVICAL SPINE WITHOUT CONTRAST TECHNIQUE: Multidetector CT imaging of the head and cervical spine was performed following the standard protocol without intravenous contrast. Multiplanar CT image reconstructions of the cervical spine were also generated. COMPARISON:  And CT 12/10/2012 and MRI 05/09/2017 FINDINGS: CT HEAD FINDINGS Brain: Stable age related involutional changes of brain with chronic small vessel ischemic disease. Negative for acute large vascular territory infarct, hemorrhage, intra-axial mass nor extra-axial fluid collections. No edema or midline shift. Vascular: No hyperdense vessel sign. Atherosclerosis of the skull base. Skull: Intact without skull fracture. No suspicious osseous lesions. Sinuses/Orbits: Bilateral cataract extractions. Intact orbits and globes. Minimal ethmoid sinus mucosal thickening. Other: Clear mastoids. CT CERVICAL SPINE FINDINGS Alignment: Maintained cervical lordosis. Skull base and vertebrae: Motion related artifacts across the C6-7 interspace limit assessment. Intact skull base. Intact atlantodental interval and craniocervical relationship. No acute cervical spine fracture. Soft tissues and spinal canal: No prevertebral fluid or swelling. No visible canal hematoma. Disc levels: There is mild disc space narrowing from C2 through C6 and moderate at C6-7 through T1-T2. Minimal anterolisthesis grade 1 of C5 and C6 is identified. There is uncovertebral joint osteoarthritis and spurring greatest at C6-7 bilaterally. Bilateral multilevel degenerative  facet arthropathy is seen. Upper chest: Clear lung apices. Other: Moderate extracranial carotid arteriosclerosis. IMPRESSION: 1. Chronic small vessel ischemic disease of periventricular white matter. No acute intracranial abnormality. 2. Cervical spondylosis without acute cervical spine fracture. Electronically Signed   By: Ashley Royalty M.D.   On: 01/16/2018 19:55   Dg Shoulder Left  Result Date: 01/16/2018 CLINICAL DATA:  Status post fall today with left shoulder pain. EXAM: LEFT SHOULDER - 2+ VIEW COMPARISON:  July 29, 2010 FINDINGS: There is osteophyte formation and high-riding left humerus. There is no acute fracture or dislocation. IMPRESSION: No acute fracture or dislocation. Electronically Signed   By: Abelardo Diesel M.D.   On: 01/16/2018 19:52   EKG Interpretation  Date/Time:  Friday January 16 2018 20:35:34 EST Ventricular Rate:  103 PR Interval:    QRS Duration: 99 QT Interval:  347 QTC Calculation: 455 R Axis:   76 Text Interpretation:  Atrial fibrillation Borderline repolarization abnormality st elevation anterior leads , unchanged from first prior Confirmed by Pattricia Boss [phone]) on 01/16/2018 9:35:57 PM   Pending Labs Unresulted Labs (From admission, onward)    Start     Ordered   01/16/18 1902  Urine culture  ONCE - STAT,   STAT     01/16/18 1909   01/16/18 1901  Culture, blood (Routine x 2)  BLOOD CULTURE X 2,   STAT     01/16/18 1901          Vitals/Pain Today's Vitals   01/16/18 2233 01/16/18 2238 01/16/18 2300 01/16/18 2333  BP:   118/67 124/69  Pulse: 96   91  Resp: 14  14 (!) 21  Temp:  99.9 F (37.7 C)    TempSrc:  Rectal    SpO2: 99%   99%  PainSc:        Isolation Precautions No active isolations  Medications Medications  metroNIDAZOLE (FLAGYL) IVPB 500 mg (0 mg Intravenous Stopped 01/16/18 2133)  heparin injection 5,000 Units (has no administration in time range)  sodium chloride flush (NS) 0.9 % injection 3 mL (has no administration in time  range)  ondansetron (ZOFRAN) tablet 4 mg (has no administration in time range)    Or  ondansetron (ZOFRAN) injection 4 mg (has no administration in time range)  ceFEPIme (MAXIPIME) 2 g in sodium chloride 0.9 % 100 mL IVPB (0 g Intravenous Stopped 01/16/18 2133)  vancomycin (VANCOCIN) IVPB 1000 mg/200 mL premix (0 mg Intravenous Stopped 01/16/18 2114)  sodium chloride 0.9 % bolus 1,000 mL (0 mLs Intravenous Stopped 01/16/18 2133)    And  sodium chloride 0.9 % bolus 1,000 mL (0 mLs Intravenous Stopped 01/16/18 2135)    And  sodium chloride 0.9 % bolus 1,000 mL (0 mLs Intravenous Stopped 01/16/18 2210)    Mobility non-ambulatory

## 2018-01-16 NOTE — ED Notes (Signed)
Notified EDP,Butler,MD. Pt. I-stat CG4 Lactic acid 3.45 and PA,Abigail made aware.

## 2018-01-16 NOTE — ED Notes (Signed)
Notified EDP,Ray,MD., pt. I-stat CG4 Lactic acid 2.09 and PA,Abigail made aware and RN,Celestial.

## 2018-01-16 NOTE — H&P (Signed)
History and Physical    Ricky [name]. QZR:[id number] DOB: [birth year] DOA: 01/16/2018  PCP: Deland Pretty, MD  Patient coming from: [facility]  I have personally briefly reviewed patient's old medical records in [place]  Chief Complaint: Fall at facility  HPI: Ricky [name]. is a 77 y.o. male with medical history significant for type 2 diabetes, hypertension, hyperlipidemia, depression, dementia, schizophrenia, CKD stage III who presented the ED from his assisted living facility with reported fall and change in mental status.  History is limited from patient due to short-term memory issues and supplemental history is obtained from EDP and chart review.  Per documentation patient seemed more confused than normal.  He had a fall with reported injury to his left shoulder.  He was sent to the ED for further evaluation and was noted to have a fever 101.46F, tachycardia, SBP 89, and decreased oxygen saturations.  Patient himself reports some left shoulder pain, but denies any subjective fevers, diaphoresis, chest pain, dyspnea, abdominal pain, diarrhea, dysuria.  He has chronic wounds on both first toes which she says are unchanged.  ED Course:  Initial recorded vitals showed BP 129/75, pulse 106, RR 33, temp 101.1 Fahrenheit rectally, SPO2 95% on room air.  Labs are significant for WBC 22.9 and lactic acid 3.45.  Code sepsis was called and blood cultures were drawn and patient was given 3 L normal saline and started on IV vancomycin, metronidazole, and cefepime.  Lactic acid improved to 2.09.  Urinalysis was negative for UTI.  Left shoulder x-ray was negative for acute fracture or dislocation.  Chest x-ray was negative for focal pneumonia, mild pulmonary vascular congestion is noted.  CT head and cervical spine show chronic small vessel periventricular disease and cervical spondylosis without acute cervical spine fracture.  The hospital service was  consulted to admit for further management and evaluation.   Review of Systems: As per HPI otherwise 10 point review of systems negative.    Past Medical History:  Diagnosis Date  . Abnormality of gait   . Adenomatous colon polyp   . Arthritis   . Cataract   . Dementia ([place])   . Depression   . Diabetes mellitus without complication ([place])   . Fatty liver   . Hyperlipidemia   . Hypertension   . Internal hemorrhoids   . Other malaise and fatigue   . Peripheral edema   . Schizophrenia ([place])   . Type II or unspecified type diabetes mellitus without mention of complication, not stated as uncontrolled   . Urinary frequency   . Urinary retention     Past Surgical History:  Procedure Laterality Date  . BALLOON DILATION N/A 05/22/2012   Procedure: BALLOON DILATION;  Surgeon: Beryle Beams, MD;  Location: [facility];  Service: Endoscopy;  Laterality: N/A;  . BALLOON DILATION N/A 06/09/2015   Procedure: BALLOON DILATION;  Surgeon: Carol Ada, MD;  Location: [facility];  Service: Endoscopy;  Laterality: N/A;  . ESOPHAGOGASTRODUODENOSCOPY  02/21/2012   Procedure: ESOPHAGOGASTRODUODENOSCOPY (EGD);  Surgeon: Beryle Beams, MD;  Location: [facility];  Service: Endoscopy;  Laterality: N/A;  . ESOPHAGOGASTRODUODENOSCOPY N/A 05/22/2012   Procedure: ESOPHAGOGASTRODUODENOSCOPY (EGD);  Surgeon: Beryle Beams, MD;  Location: [facility];  Service: Endoscopy;  Laterality: N/A;  . ESOPHAGOGASTRODUODENOSCOPY N/A 06/17/2014   Procedure: ESOPHAGOGASTRODUODENOSCOPY (EGD);  Surgeon: Carol Ada, MD;  Location: [facility];  Service: Endoscopy;  Laterality: N/A;  . ESOPHAGOGASTRODUODENOSCOPY N/A 08/10/2014   Procedure: ESOPHAGOGASTRODUODENOSCOPY (EGD);  Surgeon: Carol Ada, MD;  Location: [facility];  Service: Endoscopy;  Laterality: N/A;  . ESOPHAGOGASTRODUODENOSCOPY N/A 05/28/2015   Procedure: ESOPHAGOGASTRODUODENOSCOPY (EGD);  Surgeon: Gatha Mayer, MD;  Location: [facility];  Service:  Endoscopy;  Laterality: N/A;  . ESOPHAGOGASTRODUODENOSCOPY (EGD) WITH PROPOFOL N/A 06/09/2015   Procedure: ESOPHAGOGASTRODUODENOSCOPY (EGD) WITH PROPOFOL;  Surgeon: Carol Ada, MD;  Location: [facility];  Service: Endoscopy;  Laterality: N/A;  . SAVORY DILATION N/A 06/17/2014   Procedure: SAVORY DILATION;  Surgeon: Carol Ada, MD;  Location: [facility];  Service: Endoscopy;  Laterality: N/A;  . TONSILLECTOMY       reports that he has never smoked. He has never used smokeless tobacco. He reports that he does not drink alcohol or use drugs.  Allergies  Allergen Reactions  . Asa [Aspirin]     "Dr told me not to take it"  . Codeine Other (See Comments)    DIZZINESS with Tylenol 3  . Tylenol [Acetaminophen] Other (See Comments)    Dizziness with tylenol 3    Family History  Problem Relation Age of Onset  . Brain cancer Father      Prior to Admission medications   Medication Sig Start Date End Date Taking? Authorizing Provider  ALPRAZolam Duanne Moron) 0.5 MG tablet Take 0.5 mg by mouth at bedtime.  11/19/17  Yes [provider]  Amino Acids-Protein Hydrolys (FEEDING SUPPLEMENT, PRO-STAT SUGAR FREE 64,) LIQD Take 30 mLs by mouth 2 (two) times daily with a meal.   Yes [provider]  amLODipine (NORVASC) 5 MG tablet Take 5 mg by mouth daily. 09/04/12  Yes [provider]  Calcium Citrate-Vitamin D (CITRACAL + D PO) Take 1 tablet by mouth daily.   Yes [provider]  chlorhexidine (PERIDEX) 0.12 % solution Use as directed 15 mLs in the mouth or throat at bedtime.    Yes [provider]  Cholecalciferol (VITAMIN D-3) 1000 units CAPS Take 1 capsule by mouth daily.   Yes [provider]  docusate sodium (COLACE) 100 MG capsule Take 100 mg by mouth 2 (two) times daily.   Yes [provider]  empagliflozin (JARDIANCE) 10 MG TABS tablet Take 10 mg by mouth daily.   Yes [provider]  fluvoxaMINE (LUVOX) 100 MG tablet  Take 100 mg by mouth at bedtime.   Yes [provider]  furosemide (LASIX) 20 MG tablet Take 20 mg by mouth daily before breakfast.  12/07/11  Yes [provider]  guaiFENesin (MUCINEX) 600 MG 12 hr tablet Take 600 mg by mouth 2 (two) times daily.   Yes [provider]  HUMALOG KWIKPEN 100 UNIT/ML KiwkPen Inject 2-11 Units into the skin See admin instructions. Times: 0630 1130 1630  Scale: 101-150: 2 units 151-200: 3 units 201-250: 5 units 251-300: 7 units 301-350: 9 units >350: 11 units 09/30/17  Yes [provider]  hydrOXYzine (ATARAX/VISTARIL) 25 MG tablet Take 25 mg by mouth at bedtime as needed (sleep).   Yes [provider]  insulin detemir (LEVEMIR) 100 UNIT/ML injection Inject 40 Units into the skin at bedtime.    Yes [provider]  magnesium hydroxide (MILK OF MAGNESIA) 400 MG/5ML suspension Take 30 mLs by mouth daily as needed for mild constipation.   Yes [provider]  meloxicam (MOBIC) 15 MG tablet Take 15 mg by mouth daily. For arthritis   Yes [provider]  metFORMIN (GLUCOPHAGE) 500 MG tablet Take 500 mg by mouth 2 (two) times daily with a  meal.   Yes [provider]  Multiple Vitamins-Minerals (THERAGRAN-M PREMIER 50 PLUS PO) Take 1 tablet by mouth daily.   Yes [provider]  MYRBETRIQ 25 MG TB24 tablet Take 25 mg by mouth daily.  10/05/16  Yes [provider]  OLANZapine (ZYPREXA) 5 MG tablet 25 mg.  11/21/17  Yes [provider]  omega-3 acid ethyl esters (LOVAZA) 1 G capsule Take 2 g by mouth daily.    Yes [provider]  omeprazole (PRILOSEC) 40 MG capsule Take 40 mg by mouth daily.  09/28/12  Yes [provider]  ONGLYZA 5 MG TABS tablet Take 5 mg by mouth daily.  12/06/11  Yes [provider]  polyethylene glycol (MIRALAX / GLYCOLAX) packet Take 17 g by mouth daily as needed.   Yes [provider]  pravastatin (PRAVACHOL)  40 MG tablet Take 40 mg by mouth daily after lunch.    Yes [provider]  Specialty Vitamins Products (PROSTATE) TABS Take 1 tablet by mouth 2 (two) times daily.   Yes [provider]  tamsulosin (FLOMAX) 0.4 MG CAPS capsule Take 0.4 mg by mouth daily.  10/01/16  Yes [provider]  zolpidem (AMBIEN) 5 MG tablet Take 5 mg by mouth at bedtime.  09/24/16  Yes [provider]    Physical Exam: Vitals:   01/16/18 2238 01/16/18 2300 01/16/18 2333 01/17/18 0023  BP:  118/67 124/69 (!) 131/94  Pulse:   91 96  Resp:  14 (!) 21 20  Temp: 99.9 F (37.7 C)   98.2 F (36.8 C)  TempSrc: Rectal   Oral  SpO2:   99% 91%  Weight:    103.1 kg  Height:    5' 9.5" (1.765 m)    Constitutional: Elderly man, NAD, calm, comfortable Eyes: PERRL, lids and conjunctivae normal ENMT: Mucous membranes are moist. Posterior pharynx clear of any exudate or lesions. Normal dentition.  Neck: normal, supple, no masses. Respiratory: Inspiratory crackles left lung base, no wheezing. Normal respiratory effort. No accessory muscle use.  Cardiovascular: Regular rate and rhythm, soft systolic murmur present.  +1 pitting edema both legs. Abdomen: Obese abdomen, no tenderness, no masses palpated. No hepatosplenomegaly. Bowel sounds positive.  Musculoskeletal: Good ROM, no contractures. Normal muscle tone.  Skin: Pressure ulcer left medial buttocks without active purulent drainage, diabetic ulcers medial first toes bilaterally without active drainage Neurologic: CN 2-12 grossly intact. Sensation intact, DTR normal. Strength 5/5 in all 4.  Psychiatric: Normal judgment and insight. Alert and oriented. Normal mood.    Labs on Admission: I have personally reviewed following labs and imaging studies  CBC: Recent Labs  Lab 01/16/18 1904  WBC 22.9*  NEUTROABS 20.6*  HGB 13.3  HCT 42.7  MCV 91.6  PLT 093   Basic Metabolic Panel: Recent Labs  Lab 01/16/18 1904  NA 137  K 3.8  CL  97*  CO2 26  GLUCOSE 167*  BUN 21  CREATININE 1.33*  CALCIUM 9.1   GFR: Estimated Creatinine Clearance: 54.6 mL/min (A) (by C-G formula based on SCr of 1.33 mg/dL (H)). Liver Function Tests: Recent Labs  Lab 01/16/18 1904  AST 31  ALT 16  ALKPHOS 94  BILITOT 1.2  PROT 8.2*  ALBUMIN 4.1   No results for input(s): LIPASE, AMYLASE in the last 168 hours. No results for input(s): AMMONIA in the last 168 hours. Coagulation Profile: Recent Labs  Lab 01/16/18 1904  INR 0.94   Cardiac Enzymes: No results for input(s):  CKTOTAL, CKMB, CKMBINDEX, TROPONINI in the last 168 hours. BNP (last 3 results) No results for input(s): PROBNP in the last 8760 hours. HbA1C: No results for input(s): HGBA1C in the last 72 hours. CBG: Recent Labs  Lab 01/17/18 0023  GLUCAP 162*   Lipid Profile: No results for input(s): CHOL, HDL, LDLCALC, TRIG, CHOLHDL, LDLDIRECT in the last 72 hours. Thyroid Function Tests: No results for input(s): TSH, T4TOTAL, FREET4, T3FREE, THYROIDAB in the last 72 hours. Anemia Panel: No results for input(s): VITAMINB12, FOLATE, FERRITIN, TIBC, IRON, RETICCTPCT in the last 72 hours. Urine analysis:    Component Value Date/Time   COLORURINE YELLOW 01/16/2018 2143   APPEARANCEUR CLEAR 01/16/2018 2143   LABSPEC 1.013 01/16/2018 2143   PHURINE 6.0 01/16/2018 2143   GLUCOSEU >=500 (A) 01/16/2018 2143   HGBUR NEGATIVE 01/16/2018 2143   BILIRUBINUR NEGATIVE 01/16/2018 2143   KETONESUR NEGATIVE 01/16/2018 2143   PROTEINUR NEGATIVE 01/16/2018 2143   UROBILINOGEN 0.2 12/10/2012 1322   NITRITE NEGATIVE 01/16/2018 2143   LEUKOCYTESUR NEGATIVE 01/16/2018 2143    Radiological Exams on Admission: Dg Chest 2 View  Result Date: 01/16/2018 CLINICAL DATA:  Fever with low O2 saturation EXAM: CHEST - 2 VIEW COMPARISON:  May 08, 2017 FINDINGS: The heart size and mediastinal contours are stable. Mild central pulmonary vessel prominence is noted. There is minimal left pleural  effusion. There is no focal pneumonia. Minimal posterior pleural effusions are noted. The visualized skeletal structures are stable. IMPRESSION: No focal pneumonia. Mild central pulmonary vascular congestion. Minimal posterior pleural effusions. Electronically Signed   By: Abelardo Diesel M.D.   On: 01/16/2018 19:50   Ct Head Wo Contrast  Result Date: 01/16/2018 CLINICAL DATA:  Patient fell in bathroom and nursing facility. Fever. EXAM: CT HEAD WITHOUT CONTRAST CT CERVICAL SPINE WITHOUT CONTRAST TECHNIQUE: Multidetector CT imaging of the head and cervical spine was performed following the standard protocol without intravenous contrast. Multiplanar CT image reconstructions of the cervical spine were also generated. COMPARISON:  And CT 12/10/2012 and MRI 05/09/2017 FINDINGS: CT HEAD FINDINGS Brain: Stable age related involutional changes of brain with chronic small vessel ischemic disease. Negative for acute large vascular territory infarct, hemorrhage, intra-axial mass nor extra-axial fluid collections. No edema or midline shift. Vascular: No hyperdense vessel sign. Atherosclerosis of the skull base. Skull: Intact without skull fracture. No suspicious osseous lesions. Sinuses/Orbits: Bilateral cataract extractions. Intact orbits and globes. Minimal ethmoid sinus mucosal thickening. Other: Clear mastoids. CT CERVICAL SPINE FINDINGS Alignment: Maintained cervical lordosis. Skull base and vertebrae: Motion related artifacts across the C6-7 interspace limit assessment. Intact skull base. Intact atlantodental interval and craniocervical relationship. No acute cervical spine fracture. Soft tissues and spinal canal: No prevertebral fluid or swelling. No visible canal hematoma. Disc levels: There is mild disc space narrowing from C2 through C6 and moderate at C6-7 through T1-T2. Minimal anterolisthesis grade 1 of C5 and C6 is identified. There is uncovertebral joint osteoarthritis and spurring greatest at C6-7  bilaterally. Bilateral multilevel degenerative facet arthropathy is seen. Upper chest: Clear lung apices. Other: Moderate extracranial carotid arteriosclerosis. IMPRESSION: 1. Chronic small vessel ischemic disease of periventricular white matter. No acute intracranial abnormality. 2. Cervical spondylosis without acute cervical spine fracture. Electronically Signed   By: Ashley Royalty M.D.   On: 01/16/2018 19:55   Ct Cervical Spine Wo Contrast  Result Date: 01/16/2018 CLINICAL DATA:  Patient fell in bathroom and nursing facility. Fever. EXAM: CT HEAD WITHOUT CONTRAST CT CERVICAL SPINE WITHOUT CONTRAST TECHNIQUE: Multidetector CT imaging of the  head and cervical spine was performed following the standard protocol without intravenous contrast. Multiplanar CT image reconstructions of the cervical spine were also generated. COMPARISON:  And CT 12/10/2012 and MRI 05/09/2017 FINDINGS: CT HEAD FINDINGS Brain: Stable age related involutional changes of brain with chronic small vessel ischemic disease. Negative for acute large vascular territory infarct, hemorrhage, intra-axial mass nor extra-axial fluid collections. No edema or midline shift. Vascular: No hyperdense vessel sign. Atherosclerosis of the skull base. Skull: Intact without skull fracture. No suspicious osseous lesions. Sinuses/Orbits: Bilateral cataract extractions. Intact orbits and globes. Minimal ethmoid sinus mucosal thickening. Other: Clear mastoids. CT CERVICAL SPINE FINDINGS Alignment: Maintained cervical lordosis. Skull base and vertebrae: Motion related artifacts across the C6-7 interspace limit assessment. Intact skull base. Intact atlantodental interval and craniocervical relationship. No acute cervical spine fracture. Soft tissues and spinal canal: No prevertebral fluid or swelling. No visible canal hematoma. Disc levels: There is mild disc space narrowing from C2 through C6 and moderate at C6-7 through T1-T2. Minimal anterolisthesis grade 1 of C5  and C6 is identified. There is uncovertebral joint osteoarthritis and spurring greatest at C6-7 bilaterally. Bilateral multilevel degenerative facet arthropathy is seen. Upper chest: Clear lung apices. Other: Moderate extracranial carotid arteriosclerosis. IMPRESSION: 1. Chronic small vessel ischemic disease of periventricular white matter. No acute intracranial abnormality. 2. Cervical spondylosis without acute cervical spine fracture. Electronically Signed   By: Ashley Royalty M.D.   On: 01/16/2018 19:55   Dg Shoulder Left  Result Date: 01/16/2018 CLINICAL DATA:  Status post fall today with left shoulder pain. EXAM: LEFT SHOULDER - 2+ VIEW COMPARISON:  July 29, 2010 FINDINGS: There is osteophyte formation and high-riding left humerus. There is no acute fracture or dislocation. IMPRESSION: No acute fracture or dislocation. Electronically Signed   By: Abelardo Diesel M.D.   On: 01/16/2018 19:52    EKG: Independently reviewed.  Junctional tachycardia.  Assessment/Plan Principal Problem:   Sepsis ([place]) Active Problems:   Diabetes mellitus without complication ([place])   Hypertension   Depression   Hyperlipidemia   Schizophrenia ([place])   CKD (chronic kidney disease), stage II   Stage II pressure ulcer of left buttock ([facility])   Ricky [name]. is a 77 y.o. male with medical history significant for type 2 diabetes, hypertension, hyperlipidemia, depression, dementia, schizophrenia, CKD stage III who presented the ED from his assisted living facility with reported fall and change in mental status admitted with sepsis with unspecified source.  Sepsis, unspecified source: Patient with leukocytosis 22.9, initial hypotension on arrival, fever 101.36F concerning for sepsis.  No obvious source as chest x-ray is without pneumonia and urinalysis negative for UTI.  Abdominal exam is benign.  He does have what appears to be a stage II pressure ulcer left medial buttocks and diabetic ulcers of both first toes, however  neither of these appear advanced enough to be the source of sepsis. -Will continue broad-spectrum IV vancomycin, metronidazole, cefepime for now -Follow-up blood cultures -BP and lactic acid improved with initial IVF resuscitation, will hold further fluids with peripheral edema and mild vascular congestion noted on chest x-ray  Type 2 diabetes: On Levemir 40 units nightly, Humalog SSI, metformin, saxagliptin, and Jardiance as an outpatient. -Levemir 30 units qhs and SSI TIDAC/HS while inpatient  Hypertension: -Hold home amlodipine with initial hypotension  Hyperlipidemia: -Continue statin  Stage II pressure ulcer left buttocks, diabetic ulcers bilateral first toes: -Consult to wound care  Depression/schizophrenia/anxiety: -Continue home olanzapine, fluvoxamine, Xanax  DVT prophylaxis: subq heparin Code Status:  DNR, gold form at bedside Family Communication: None present on admission Disposition Plan: Pending clinical progress, culture data results Consults called: None Admission status: Inpatient   Zada Finders MD [facility] Pager [phone]  If 7PM-7AM, please contact night-coverage [email or web address] Password [email or web address]  01/17/2018, 1:45 AM

## 2018-01-16 NOTE — ED Provider Notes (Signed)
[facility] DEPT Provider Note   CSN: [id number] Arrival date & time: 01/16/18  1827     History   Chief Complaint Chief Complaint  Patient presents with  . Fall  . Fever    HPI Ricky [name]. is a 77 y.o. male he presents the emergency department with chief complaint of fall, and change in mental status.  He is a resident of the assisted living facility at [facility].  He has a past medical history of dementia, schizophrenia, diabetes.  According to nursing notes and he had a fall today.  He has had pain in his right shoulder.  Apparently this evening staff found him and he did not seem to be at baseline, more confused than normal.  Patient was sent to the emergency department for further evaluation.  Upon arrival he was found to be febrile to 101.1, tachycardic, low oxygen saturations with blood pressure of systolic of 89.  Should not called as a stroke code sepsis.  Given fluids and antibiotics upon arrival.  HPI  Past Medical History:  Diagnosis Date  . Abnormality of gait   . Adenomatous colon polyp   . Arthritis   . Cataract   . Dementia ([place])   . Depression   . Diabetes mellitus without complication ([place])   . Fatty liver   . Hyperlipidemia   . Hypertension   . Internal hemorrhoids   . Other malaise and fatigue   . Peripheral edema   . Schizophrenia ([place])   . Type II or unspecified type diabetes mellitus without mention of complication, not stated as uncontrolled   . Urinary frequency   . Urinary retention     Patient Active Problem List   Diagnosis Date Noted  . MCI (mild cognitive impairment) 10/24/2017  . OCD (obsessive compulsive disorder) 10/24/2017  . Pressure injury of skin 05/08/2017  . Acute respiratory failure ([place]) 05/06/2017  . HCAP (healthcare-associated pneumonia) 05/06/2017  . Sepsis ([place]) 05/06/2017  . Seizure ([place]) 05/06/2017  . CKD (chronic kidney disease), stage II 05/06/2017  . Food  impaction of esophagus   . Esophageal stricture   . Hiatal hernia   . Urinary retention 12/10/2012  . Rhabdomyolysis 12/10/2012  . Falls 12/10/2012  . First degree AV block 10/20/2012  . Extremity muscle atrophy 10/20/2012  . Lumbosacral plexopathy 07/20/2012  . Diabetes mellitus without complication ([place])   . Hypertension   . Depression   . Arthritis   . Hyperlipidemia   . Cataract   . Urinary frequency   . Fatty liver   . Schizophrenia ([place])   . Peripheral edema   . Internal hemorrhoids   . Adenomatous colon polyp   . Type II or unspecified type diabetes mellitus without mention of complication, not stated as uncontrolled   . Type II or unspecified type diabetes mellitus with neurological manifestations, not stated as uncontrolled(250.60) 03/20/2012  . Abnormality of gait 03/20/2012  . Other malaise and fatigue 03/20/2012    Past Surgical History:  Procedure Laterality Date  . BALLOON DILATION N/A 05/22/2012   Procedure: BALLOON DILATION;  Surgeon: Beryle Beams, MD;  Location: [facility] ENDOSCOPY;  Service: Endoscopy;  Laterality: N/A;  . BALLOON DILATION N/A 06/09/2015   Procedure: BALLOON DILATION;  Surgeon: Carol Ada, MD;  Location: [facility];  Service: Endoscopy;  Laterality: N/A;  . ESOPHAGOGASTRODUODENOSCOPY  02/21/2012   Procedure: ESOPHAGOGASTRODUODENOSCOPY (EGD);  Surgeon: Beryle Beams, MD;  Location: [facility];  Service: Endoscopy;  Laterality: N/A;  .  ESOPHAGOGASTRODUODENOSCOPY N/A 05/22/2012   Procedure: ESOPHAGOGASTRODUODENOSCOPY (EGD);  Surgeon: Beryle Beams, MD;  Location: [facility];  Service: Endoscopy;  Laterality: N/A;  . ESOPHAGOGASTRODUODENOSCOPY N/A 06/17/2014   Procedure: ESOPHAGOGASTRODUODENOSCOPY (EGD);  Surgeon: Carol Ada, MD;  Location: [facility];  Service: Endoscopy;  Laterality: N/A;  . ESOPHAGOGASTRODUODENOSCOPY N/A 08/10/2014   Procedure: ESOPHAGOGASTRODUODENOSCOPY (EGD);  Surgeon: Carol Ada, MD;  Location: [facility];  Service:  Endoscopy;  Laterality: N/A;  . ESOPHAGOGASTRODUODENOSCOPY N/A 05/28/2015   Procedure: ESOPHAGOGASTRODUODENOSCOPY (EGD);  Surgeon: Gatha Mayer, MD;  Location: [facility];  Service: Endoscopy;  Laterality: N/A;  . ESOPHAGOGASTRODUODENOSCOPY (EGD) WITH PROPOFOL N/A 06/09/2015   Procedure: ESOPHAGOGASTRODUODENOSCOPY (EGD) WITH PROPOFOL;  Surgeon: Carol Ada, MD;  Location: [facility];  Service: Endoscopy;  Laterality: N/A;  . SAVORY DILATION N/A 06/17/2014   Procedure: SAVORY DILATION;  Surgeon: Carol Ada, MD;  Location: [facility];  Service: Endoscopy;  Laterality: N/A;  . TONSILLECTOMY          Home Medications    Prior to Admission medications   Medication Sig Start Date End Date Taking? Authorizing Provider  ALPRAZolam Duanne Moron) 0.5 MG tablet Take 0.5 mg by mouth at bedtime.  11/19/17  Yes [provider]  Amino Acids-Protein Hydrolys (FEEDING SUPPLEMENT, PRO-STAT SUGAR FREE 64,) LIQD Take 30 mLs by mouth 2 (two) times daily with a meal.   Yes [provider]  amLODipine (NORVASC) 5 MG tablet Take 5 mg by mouth daily. 09/04/12  Yes [provider]  Calcium Citrate-Vitamin D (CITRACAL + D PO) Take 1 tablet by mouth daily.   Yes [provider]  chlorhexidine (PERIDEX) 0.12 % solution Use as directed 15 mLs in the mouth or throat at bedtime.    Yes [provider]  Cholecalciferol (VITAMIN D-3) 1000 units CAPS Take 1 capsule by mouth daily.   Yes [provider]  docusate sodium (COLACE) 100 MG capsule Take 100 mg by mouth 2 (two) times daily.   Yes [provider]  empagliflozin (JARDIANCE) 10 MG TABS tablet Take 10 mg by mouth daily.   Yes [provider]  fluvoxaMINE (LUVOX) 100 MG tablet Take 100 mg by mouth at bedtime.   Yes [provider]  furosemide (LASIX) 20 MG tablet Take 20 mg by mouth daily before breakfast.  12/07/11  Yes [provider]  guaiFENesin (MUCINEX) 600 MG 12 hr tablet  Take 600 mg by mouth 2 (two) times daily.   Yes [provider]  HUMALOG KWIKPEN 100 UNIT/ML KiwkPen Inject 2-11 Units into the skin See admin instructions. Times: 0630 1130 1630  Scale: 101-150: 2 units 151-200: 3 units 201-250: 5 units 251-300: 7 units 301-350: 9 units >350: 11 units 09/30/17  Yes [provider]  hydrOXYzine (ATARAX/VISTARIL) 25 MG tablet Take 25 mg by mouth at bedtime as needed (sleep).   Yes [provider]  insulin detemir (LEVEMIR) 100 UNIT/ML injection Inject 40 Units into the skin at bedtime.    Yes [provider]  magnesium hydroxide (MILK OF MAGNESIA) 400 MG/5ML suspension Take 30 mLs by mouth daily as needed for mild constipation.   Yes [provider]  meloxicam (MOBIC) 15 MG tablet Take 15 mg by mouth daily. For arthritis   Yes [provider]  metFORMIN (GLUCOPHAGE) 500 MG tablet Take 500 mg by mouth 2 (two) times daily with a meal.   Yes [provider]  Multiple Vitamins-Minerals (THERAGRAN-M PREMIER 50 PLUS PO) Take 1 tablet by mouth daily.  Yes [provider]  MYRBETRIQ 25 MG TB24 tablet Take 25 mg by mouth daily.  10/05/16  Yes [provider]  OLANZapine (ZYPREXA) 5 MG tablet 25 mg.  11/21/17  Yes [provider]  omega-3 acid ethyl esters (LOVAZA) 1 G capsule Take 2 g by mouth daily.    Yes [provider]  omeprazole (PRILOSEC) 40 MG capsule Take 40 mg by mouth daily.  09/28/12  Yes [provider]  ONGLYZA 5 MG TABS tablet Take 5 mg by mouth daily.  12/06/11  Yes [provider]  polyethylene glycol (MIRALAX / GLYCOLAX) packet Take 17 g by mouth daily as needed.   Yes [provider]  pravastatin (PRAVACHOL) 40 MG tablet Take 40 mg by mouth daily after lunch.    Yes [provider]  Specialty Vitamins Products (PROSTATE) TABS Take 1 tablet by mouth 2 (two) times daily.   Yes [provider]  tamsulosin  (FLOMAX) 0.4 MG CAPS capsule Take 0.4 mg by mouth daily.  10/01/16  Yes [provider]  zolpidem (AMBIEN) 5 MG tablet Take 5 mg by mouth at bedtime.  09/24/16  Yes [provider]    Family History Family History  Problem Relation Age of Onset  . Brain cancer Father     Social History Social History   Tobacco Use  . Smoking status: Never Smoker  . Smokeless tobacco: Never Used  Substance Use Topics  . Alcohol use: No  . Drug use: No     Allergies   Asa [aspirin]; Codeine; and Tylenol [acetaminophen]   Review of Systems Review of Systems  Unable to perform ROS: Dementia     Physical Exam Updated Vital Signs BP 119/65   Pulse (!) 106   Temp (!) 101.1 F (38.4 C) (Rectal)   Resp 20   SpO2 95%   Physical Exam Vitals signs and nursing note reviewed.  Constitutional:      General: He is not in acute distress.    Appearance: He is well-developed. He is ill-appearing and toxic-appearing. He is not diaphoretic.  HENT:     Head: Normocephalic and atraumatic.  Eyes:     General: No scleral icterus.    Conjunctiva/sclera: Conjunctivae normal.  Neck:     Musculoskeletal: Normal range of motion and neck supple.  Cardiovascular:     Rate and Rhythm: Normal rate and regular rhythm.     Heart sounds: Normal heart sounds.  Pulmonary:     Effort: Pulmonary effort is normal. No respiratory distress.     Breath sounds: Normal breath sounds.  Abdominal:     Palpations: Abdomen is soft.     Tenderness: There is no abdominal tenderness.  Skin:    General: Skin is warm and dry.     Capillary Refill: Capillary refill takes more than 3 seconds.     Findings: Rash present.     Comments: Erythematous rash in the groin and buttocks with satellite l lesions consistent with candidal intertrigo.  Neurological:     Mental Status: He is alert.  Psychiatric:        Behavior: Behavior normal.      ED Treatments / Results  Labs (all labs ordered are listed, but  only abnormal results are displayed) Labs Reviewed  COMPREHENSIVE METABOLIC PANEL - Abnormal; Notable for the following components:      Result Value   Chloride 97 (*)    Glucose, Bld 167 (*)    Creatinine, Ser 1.33 (*)  Total Protein 8.2 (*)    GFR calc non Af Amer 51 (*)    GFR calc Af Amer 59 (*)    All other components within normal limits  CBC WITH DIFFERENTIAL/PLATELET - Abnormal; Notable for the following components:   WBC 22.9 (*)    Neutro Abs 20.6 (*)    Lymphs Abs 0.5 (*)    Monocytes Absolute 1.5 (*)    Abs Immature Granulocytes 0.13 (*)    All other components within normal limits  I-STAT CG4 LACTIC ACID, ED - Abnormal; Notable for the following components:   Lactic Acid, Venous 3.45 (*)    All other components within normal limits  I-STAT CG4 LACTIC ACID, ED - Abnormal; Notable for the following components:   Lactic Acid, Venous 2.09 (*)    All other components within normal limits  CULTURE, BLOOD (ROUTINE X 2)  CULTURE, BLOOD (ROUTINE X 2)  URINE CULTURE  PROTIME-INR  URINALYSIS, ROUTINE W REFLEX MICROSCOPIC  I-STAT TROPONIN, ED  I-STAT CG4 LACTIC ACID, ED  I-STAT CG4 LACTIC ACID, ED    EKG None  Radiology Dg Chest 2 View  Result Date: 01/16/2018 CLINICAL DATA:  Fever with low O2 saturation EXAM: CHEST - 2 VIEW COMPARISON:  May 08, 2017 FINDINGS: The heart size and mediastinal contours are stable. Mild central pulmonary vessel prominence is noted. There is minimal left pleural effusion. There is no focal pneumonia. Minimal posterior pleural effusions are noted. The visualized skeletal structures are stable. IMPRESSION: No focal pneumonia. Mild central pulmonary vascular congestion. Minimal posterior pleural effusions. Electronically Signed   By: Abelardo Diesel M.D.   On: 01/16/2018 19:50   Ct Head Wo Contrast  Result Date: 01/16/2018 CLINICAL DATA:  Patient fell in bathroom and nursing facility. Fever. EXAM: CT HEAD WITHOUT CONTRAST CT CERVICAL SPINE  WITHOUT CONTRAST TECHNIQUE: Multidetector CT imaging of the head and cervical spine was performed following the standard protocol without intravenous contrast. Multiplanar CT image reconstructions of the cervical spine were also generated. COMPARISON:  And CT 12/10/2012 and MRI 05/09/2017 FINDINGS: CT HEAD FINDINGS Brain: Stable age related involutional changes of brain with chronic small vessel ischemic disease. Negative for acute large vascular territory infarct, hemorrhage, intra-axial mass nor extra-axial fluid collections. No edema or midline shift. Vascular: No hyperdense vessel sign. Atherosclerosis of the skull base. Skull: Intact without skull fracture. No suspicious osseous lesions. Sinuses/Orbits: Bilateral cataract extractions. Intact orbits and globes. Minimal ethmoid sinus mucosal thickening. Other: Clear mastoids. CT CERVICAL SPINE FINDINGS Alignment: Maintained cervical lordosis. Skull base and vertebrae: Motion related artifacts across the C6-7 interspace limit assessment. Intact skull base. Intact atlantodental interval and craniocervical relationship. No acute cervical spine fracture. Soft tissues and spinal canal: No prevertebral fluid or swelling. No visible canal hematoma. Disc levels: There is mild disc space narrowing from C2 through C6 and moderate at C6-7 through T1-T2. Minimal anterolisthesis grade 1 of C5 and C6 is identified. There is uncovertebral joint osteoarthritis and spurring greatest at C6-7 bilaterally. Bilateral multilevel degenerative facet arthropathy is seen. Upper chest: Clear lung apices. Other: Moderate extracranial carotid arteriosclerosis. IMPRESSION: 1. Chronic small vessel ischemic disease of periventricular white matter. No acute intracranial abnormality. 2. Cervical spondylosis without acute cervical spine fracture. Electronically Signed   By: Ashley Royalty M.D.   On: 01/16/2018 19:55   Ct Cervical Spine Wo Contrast  Result Date: 01/16/2018 CLINICAL DATA:  Patient  fell in bathroom and nursing facility. Fever. EXAM: CT HEAD WITHOUT CONTRAST CT CERVICAL SPINE WITHOUT CONTRAST TECHNIQUE:  Multidetector CT imaging of the head and cervical spine was performed following the standard protocol without intravenous contrast. Multiplanar CT image reconstructions of the cervical spine were also generated. COMPARISON:  And CT 12/10/2012 and MRI 05/09/2017 FINDINGS: CT HEAD FINDINGS Brain: Stable age related involutional changes of brain with chronic small vessel ischemic disease. Negative for acute large vascular territory infarct, hemorrhage, intra-axial mass nor extra-axial fluid collections. No edema or midline shift. Vascular: No hyperdense vessel sign. Atherosclerosis of the skull base. Skull: Intact without skull fracture. No suspicious osseous lesions. Sinuses/Orbits: Bilateral cataract extractions. Intact orbits and globes. Minimal ethmoid sinus mucosal thickening. Other: Clear mastoids. CT CERVICAL SPINE FINDINGS Alignment: Maintained cervical lordosis. Skull base and vertebrae: Motion related artifacts across the C6-7 interspace limit assessment. Intact skull base. Intact atlantodental interval and craniocervical relationship. No acute cervical spine fracture. Soft tissues and spinal canal: No prevertebral fluid or swelling. No visible canal hematoma. Disc levels: There is mild disc space narrowing from C2 through C6 and moderate at C6-7 through T1-T2. Minimal anterolisthesis grade 1 of C5 and C6 is identified. There is uncovertebral joint osteoarthritis and spurring greatest at C6-7 bilaterally. Bilateral multilevel degenerative facet arthropathy is seen. Upper chest: Clear lung apices. Other: Moderate extracranial carotid arteriosclerosis. IMPRESSION: 1. Chronic small vessel ischemic disease of periventricular white matter. No acute intracranial abnormality. 2. Cervical spondylosis without acute cervical spine fracture. Electronically Signed   By: Ashley Royalty M.D.   On:  01/16/2018 19:55   Dg Shoulder Left  Result Date: 01/16/2018 CLINICAL DATA:  Status post fall today with left shoulder pain. EXAM: LEFT SHOULDER - 2+ VIEW COMPARISON:  July 29, 2010 FINDINGS: There is osteophyte formation and high-riding left humerus. There is no acute fracture or dislocation. IMPRESSION: No acute fracture or dislocation. Electronically Signed   By: Abelardo Diesel M.D.   On: 01/16/2018 19:52    Procedures .Critical Care Performed by: Margarita Mail, PA-C Authorized by: Margarita Mail, PA-C   Critical care provider statement:    Critical care time (minutes):  30   Critical care was necessary to treat or prevent imminent or life-threatening deterioration of the following conditions:  Sepsis   Critical care was time spent personally by me on the following activities:  Discussions with consultants, evaluation of patient's response to treatment, examination of patient, ordering and performing treatments and interventions, ordering and review of laboratory studies, ordering and review of radiographic studies, pulse oximetry, re-evaluation of patient's condition, obtaining history from patient or surrogate and review of old charts   (including critical care time)  Medications Ordered in ED Medications  metroNIDAZOLE (FLAGYL) IVPB 500 mg (0 mg Intravenous Stopped 01/16/18 2133)  sodium chloride 0.9 % bolus 1,000 mL (0 mLs Intravenous Stopped 01/16/18 2133)    And  sodium chloride 0.9 % bolus 1,000 mL (0 mLs Intravenous Stopped 01/16/18 2135)    And  sodium chloride 0.9 % bolus 1,000 mL (has no administration in time range)  ceFEPIme (MAXIPIME) 2 g in sodium chloride 0.9 % 100 mL IVPB (0 g Intravenous Stopped 01/16/18 2133)  vancomycin (VANCOCIN) IVPB 1000 mg/200 mL premix (1,000 mg Intravenous New Bag/Given 01/16/18 2014)     Initial Impression / Assessment and Plan / ED Course  I have reviewed the triage vital signs and the nursing notes.  Pertinent labs & imaging  results that were available during my care of the patient were reviewed by me and considered in my medical decision making (see chart for details).  Clinical Course as  of Jan 16 2134  Fri Jan 16, 2018  2058 Glucose(!): 167 [AH]  2058 WBC(!): 22.9 [AH]    Clinical Course User Index [AH] Margarita Mail, PA-C    Patient with undifferentiated sepsis.  Upon arrival he had hypotension, hypoxia elevated heart rate and fever.  His vital signs are normalizing.  Urine is currently still pending.  Patient oxygen improved on 2 L via nasal cannula.  Patient was given 650 mg of Tylenol at his facility.  Chest x-ray shows some pulmonary vascular congestion.  Patient will be admitted to the hospitalist service for his sepsis.  Final Clinical Impressions(s) / ED Diagnoses   Final diagnoses:  Sepsis, due to unspecified organism, unspecified whether acute organ dysfunction present [facility])    ED Discharge Orders    None       Margarita Mail, PA-C 01/19/18 6701    Pattricia Boss, MD 01/19/18 [phone]

## 2018-01-17 ENCOUNTER — Other Ambulatory Visit: Payer: Self-pay

## 2018-01-17 DIAGNOSIS — R7881 Bacteremia: Secondary | ICD-10-CM

## 2018-01-17 LAB — GLUCOSE, CAPILLARY
Glucose-Capillary: 102 mg/dL — ABNORMAL HIGH (ref 70–99)
Glucose-Capillary: 129 mg/dL — ABNORMAL HIGH (ref 70–99)
Glucose-Capillary: 143 mg/dL — ABNORMAL HIGH (ref 70–99)
Glucose-Capillary: 156 mg/dL — ABNORMAL HIGH (ref 70–99)
Glucose-Capillary: 162 mg/dL — ABNORMAL HIGH (ref 70–99)

## 2018-01-17 LAB — BLOOD CULTURE ID PANEL (REFLEXED)
Acinetobacter baumannii: NOT DETECTED
CANDIDA ALBICANS: NOT DETECTED
Candida glabrata: NOT DETECTED
Candida krusei: NOT DETECTED
Candida parapsilosis: NOT DETECTED
Candida tropicalis: NOT DETECTED
ESCHERICHIA COLI: NOT DETECTED
Enterobacter cloacae complex: NOT DETECTED
Enterobacteriaceae species: NOT DETECTED
Enterococcus species: NOT DETECTED
Haemophilus influenzae: NOT DETECTED
Klebsiella oxytoca: NOT DETECTED
Klebsiella pneumoniae: NOT DETECTED
Listeria monocytogenes: NOT DETECTED
Methicillin resistance: DETECTED — AB
Neisseria meningitidis: NOT DETECTED
Proteus species: NOT DETECTED
Pseudomonas aeruginosa: NOT DETECTED
STREPTOCOCCUS AGALACTIAE: NOT DETECTED
STREPTOCOCCUS PYOGENES: NOT DETECTED
Serratia marcescens: NOT DETECTED
Staphylococcus aureus (BCID): DETECTED — AB
Staphylococcus species: DETECTED — AB
Streptococcus pneumoniae: NOT DETECTED
Streptococcus species: NOT DETECTED

## 2018-01-17 LAB — MRSA PCR SCREENING: MRSA by PCR: POSITIVE — AB

## 2018-01-17 MED ORDER — SODIUM CHLORIDE 0.9 % IV SOLN
2.0000 g | Freq: Three times a day (TID) | INTRAVENOUS | Status: DC
  Administered 2018-01-17 – 2018-01-18 (×4): 2 g via INTRAVENOUS
  Filled 2018-01-17 (×5): qty 2

## 2018-01-17 MED ORDER — VANCOMYCIN HCL 10 G IV SOLR
1250.0000 mg | Freq: Every day | INTRAVENOUS | Status: DC
  Administered 2018-01-17 – 2018-01-19 (×3): 1250 mg via INTRAVENOUS
  Filled 2018-01-17 (×5): qty 1250

## 2018-01-17 MED ORDER — MUPIROCIN 2 % EX OINT
1.0000 "application " | TOPICAL_OINTMENT | Freq: Two times a day (BID) | CUTANEOUS | Status: AC
  Administered 2018-01-17 – 2018-01-21 (×9): 1 via NASAL
  Filled 2018-01-17: qty 22

## 2018-01-17 MED ORDER — CHLORHEXIDINE GLUCONATE CLOTH 2 % EX PADS
6.0000 | MEDICATED_PAD | Freq: Every day | CUTANEOUS | Status: AC
  Administered 2018-01-17 – 2018-01-21 (×5): 6 via TOPICAL

## 2018-01-17 NOTE — Evaluation (Signed)
Occupational Therapy Evaluation Patient Details Name: Ricky [name]. MRN: [id number] DOB: [birth year] Today's Date: 01/17/2018    History of Present Illness  77 y.o. male with medical history significant for type 2 diabetes, hypertension, hyperlipidemia, depression, dementia, schizophrenia, CKD stage III who presented the ED from his assisted living facility with  fall and change in mental status, fever--sepsis;   Left shoulder x-ray was negative for acute fracture or dislocation   Clinical Impression   Pt was admitted for the above.  He has a caregiver to assist with adls at baseline.  Unsure of information pt provided; it appears that he used w/c for mobility with assistance. Will follow in acute setting focusing on increasing independence with toileting    Follow Up Recommendations  (ALF if they can continue to meet needs; if not snf)    Equipment Recommendations  3 in 1 bedside commode(drop arm, if he doesn't have)    Recommendations for Other Services       Precautions / Restrictions Precautions Precautions: Fall      Mobility Bed Mobility Overal bed mobility: Needs Assistance Bed Mobility: Supine to Sit     Supine to sit: +2 for safety/equipment;+2 for physical assistance;Min assist     General bed mobility comments: assist with LEs, trunk, bed pad used to scoot to EOB  Transfers Overall transfer level: Needs assistance   Transfers: Lateral/Scoot Transfers          Lateral/Scoot Transfers: Max assist;+2 physical assistance;+2 safety/equipment;From elevated surface General transfer comment: assist with lateral scooting bed to chair, requires +2 for safety, blocking knees and with lifting; pt assists self with UEs and RLE     Balance Overall balance assessment: Needs assistance;History of Falls Sitting-balance support: Feet supported Sitting balance-Leahy Scale: Fair         Standing balance comment: unable to stand at baseline                            ADL either performed or assessed with clinical judgement   ADL Overall ADL's : Needs assistance/impaired Eating/Feeding: Set up   Grooming: Minimal assistance   Upper Body Bathing: Set up;Supervision/ safety   Lower Body Bathing: Maximal assistance;Bed level   Upper Body Dressing : Set up;Supervision/safety;Sitting   Lower Body Dressing: Total assistance;Bed level   Toilet Transfer: Maximal assistance;+2 for physical assistance(lateral scoot to chair)             General ADL Comments: unsure of how much assistance he had for ADLs at baseline     Vision         Perception     Praxis      Pertinent Vitals/Pain Pain Assessment: Faces Faces Pain Scale: Hurts a little bit Pain Location: nonspecific, grimaces with initiation of movement LEs Pain Descriptors / Indicators: Grimacing Pain Intervention(s): Limited activity within patient's tolerance;Monitored during session;Repositioned     Hand Dominance     Extremity/Trunk Assessment Upper Extremity Assessment Upper Extremity Assessment: Generalized weakness(bil shoulder ROM decreased 70-80 degrees)          Communication Communication Communication: No difficulties   Cognition Arousal/Alertness: Awake/alert Behavior During Therapy: WFL for tasks assessed/performed Overall Cognitive Status: History of cognitive impairments - at baseline                                 General Comments: STM deficits at  baseline  General Comments       Exercises     Shoulder Instructions      Home Living Family/patient expects to be discharged to:: Assisted living                                 Additional Comments: [name]--per chart review and previous notes.  ? memory care      Prior Functioning/Environment Level of Independence: Needs assistance  Gait / Transfers Assistance Needed: patient states that he is w/c bound at baseline but can transfer in and out of w/c  with  assist at ALF ADL's / [place] Needed: caregiver 7days per week, 3 hours per day from previous notes (assist with ADLs)            OT Problem List: Decreased strength;Decreased activity tolerance;Pain;Decreased cognition;Decreased safety awareness      OT Treatment/Interventions: Self-care/ADL training;DME and/or AE instruction;Patient/family education;Balance training;Therapeutic activities    OT Goals(Current goals can be found in the care plan section) Acute Rehab OT Goals Patient Stated Goal: none stated OT Goal Formulation: With patient Time For Goal Achievement: 01/31/18 Potential to Achieve Goals: Good ADL Goals Pt Will Transfer to Toilet: with min assist;bedside commode(lat scoot, drop arm) Additional ADL Goal #1: pt will shift side to side on bsc with min A for hygiene  OT Frequency: Min 2X/week   Barriers to D/C:            Co-evaluation PT/OT/SLP Co-Evaluation/Treatment: Yes Reason for Co-Treatment: For patient/therapist safety PT goals addressed during session: Mobility/safety with mobility OT goals addressed during session: ADL's and self-care      AM-PAC OT "6 Clicks" Daily Activity     Outcome Measure Help from another person eating meals?: A Little Help from another person taking care of personal grooming?: A Little Help from another person toileting, which includes using toliet, bedpan, or urinal?: A Lot Help from another person bathing (including washing, rinsing, drying)?: A Lot Help from another person to put on and taking off regular upper body clothing?: A Lot Help from another person to put on and taking off regular lower body clothing?: Total 6 Click Score: 13   End of Session Nurse Communication: Mobility status(maximove pad placed)  Activity Tolerance: Patient tolerated treatment well Patient left: in chair;with call bell/phone within reach;with chair alarm set  OT Visit Diagnosis: Muscle weakness (generalized) (M62.81)                 Time: 5537-4827 OT Time Calculation (min): 24 min Charges:  OT General Charges $OT Visit: 1 Visit OT Evaluation $OT Eval Low Complexity: [place], OTR/L Acute Rehabilitation Services [phone] [facility] pager [phone] office 01/17/2018  Ricky [name] 01/17/2018, 2:11 PM

## 2018-01-17 NOTE — Progress Notes (Signed)
Pharmacy Antibiotic Note  Ricky [name]. is a 77 y.o. male admitted on 01/16/2018 with sepsis.  Pharmacy has been consulted for vancomycin dosing.  Plan: Cefepime 2 Gm IV q8h (MD) Vancomycin 1 Gm x1 then 1250 mg IV q24h for est AUC = 535 Using scr=1.33 Goal AUC = 400-500 F/u scr/cultures/levels  Height: 5' 9.5" (176.5 cm) Weight: 227 lb 4.7 oz (103.1 kg) IBW/kg (Calculated) : 71.85  Temp (24hrs), Avg:99.7 F (37.6 C), Min:98.2 F (36.8 C), Max:101.1 F (38.4 C)  Recent Labs  Lab 01/16/18 1904 01/16/18 1911 01/16/18 2118  WBC 22.9*  --   --   CREATININE 1.33*  --   --   LATICACIDVEN  --  3.45* 2.09*    Estimated Creatinine Clearance: 54.6 mL/min (A) (by C-G formula based on SCr of 1.33 mg/dL (H)).    Allergies  Allergen Reactions  . Asa [Aspirin]     "Dr told me not to take it"  . Codeine Other (See Comments)    DIZZINESS with Tylenol 3  . Tylenol [Acetaminophen] Other (See Comments)    Dizziness with tylenol 3    Antimicrobials this admission: 12/27 cefepime >>  12/27 vancomycin >>   Dose adjustments this admission:   Microbiology results:  BCx:   UCx:    Sputum:    MRSA PCR:  Thank you for allowing pharmacy to be a part of this patient's care.  Dorrene German 01/17/2018 1:49 AM

## 2018-01-17 NOTE — Progress Notes (Signed)
PROGRESS NOTE  Ricky [name]. NTZ:[id number] DOB: [birth year] DOA: 01/16/2018 PCP: Deland Pretty, MD  Brief Narrative: 77 year old man PMH including diabetes mellitus type 2, depression, schizophrenia, dementia presented after a fall and increasing confusion.  Noted to have fever, tachycardia and hypotension as well as hypoxia.  Admitted for sepsis unknown etiology.  Assessment/Plan Sepsis secondary to MRSA bacteremia with leukocytosis, hypotension and fever on admission.  Chest x-ray negative, urinalysis negative.  No definite source.  Stage II pressure ulcer of the buttocks and diabetic ulcers of both first toes not felt to be grossly infected on admission however may be source.  Recently had debridement of wounds on first toes and podiatry office 12/20. --Continue empiric antibiotics, follow-up culture data  Acute hypoxic respiratory failure --Resolved.  Etiology unclear.  Diabetes mellitus type 2 --CBG stable.  Continue Levemir, sliding scale insulin.  Hold oral agents while inpatient.  Hypertension, med medications on hold given hypotension on admission.  Stage II pressure ulcer left buttocks with diabetic ulcers bilateral first toes --Wound care  Dementia, schizophrenia --Continue olanzapine, fluvoxamine, Xanax   Bacteremic, follow-up culture data, continue empiric antibiotics.  DVT prophylaxis: heparin Code Status: DNR Family Communication: none Disposition Plan: Likely return to assisted living facility in [place], [place]  [facility] Direct contact: [phone] --[address] [place]  --[email or web address]; password [email or web address]  7PM-7AM contact night coverage as above 01/17/2018, 6:26 PM  LOS: 1 day   Consultants:    Procedures:    Antimicrobials:    Interval history/Subjective: Feels much better.  No complaints at this time.  No pain, nausea or vomiting.  Tolerating diet.  Objective: Vitals:  Vitals:   01/17/18 0523 01/17/18 1348    BP: 114/67 133/81  Pulse: 70 64  Resp: 17   Temp: 98.3 F (36.8 C) 98 F (36.7 C)  SpO2: 100% 94%    Exam:  Constitutional:   Appears calm and comfortable sitting in chair Eyes:  . pupils and irises appear normal . Normal lids  ENMT:  . grossly normal hearing  . Lips appear normal Respiratory:  . CTA bilaterally, no w/r/r.  . Respiratory effort normal.  Cardiovascular:  . RRR, no m/r/g . No LE extremity edema   Abdomen:  . Soft, nontender Musculoskeletal:  . Digits/nails BUE: no clubbing, cyanosis, petechiae, infection Psychiatric:  . Mental status o Mood, affect appropriate  I have personally reviewed the following:   Data: . WBC 22.9 on admission, creatinine 1.33 on admission . Troponin negative, lactic acid trended down 3.45 > 2.09.  Urinalysis negative.  Urine culture pending.  Scheduled Meds: . ALPRAZolam  0.5 mg Oral QHS  . Chlorhexidine Gluconate Cloth  6 each Topical Q0600  . docusate sodium  100 mg Oral BID  . fluvoxaMINE  100 mg Oral QHS  . heparin  5,000 Units Subcutaneous Q8H  . insulin aspart  0-5 Units Subcutaneous QHS  . insulin aspart  0-9 Units Subcutaneous TID WC  . insulin detemir  30 Units Subcutaneous QHS  . mupirocin ointment  1 application Nasal BID  . OLANZapine  25 mg Oral QHS  . pantoprazole  40 mg Oral Daily  . pravastatin  40 mg Oral QPC lunch  . sodium chloride flush  3 mL Intravenous Q12H   Continuous Infusions: . ceFEPime (MAXIPIME) IV 2 g (01/17/18 1332)  . metronidazole 500 mg (01/17/18 1136)  . vancomycin Stopped (01/17/18 1115)    Principal Problem:   MRSA bacteremia Active Problems:   Diabetes  mellitus without complication ([place])   Hypertension   Depression   Hyperlipidemia   Schizophrenia ([place])   Sepsis ([place])   CKD (chronic kidney disease), stage II   Stage II pressure ulcer of left buttock ([facility])   LOS: 1 day

## 2018-01-17 NOTE — Plan of Care (Signed)
Pt alert and oriented, some forgetfulness this am. OOB in chair with PT/OT. Caregiver at the bedside.  RN will monitor.

## 2018-01-17 NOTE — Evaluation (Addendum)
Physical Therapy Evaluation Patient Details Name: Ricky [name]. MRN: [id number] DOB: February 15, 1940 Today's Date: 01/17/2018   History of Present Illness   77 y.o. male with medical history significant for type 2 diabetes, hypertension, hyperlipidemia, depression, dementia, schizophrenia, CKD stage III who presented the ED from his assisted living facility with  fall and change in mental status, fever--sepsis;   Left shoulder x-ray was negative for acute fracture or dislocation  Clinical Impression  Pt admitted with above diagnosis. Pt currently with functional limitations due to the deficits listed below (see PT Problem List).  Recommend SNF vs ALF with assist for all transfers and ADLs (if available), chart states pt had caregiver daily prior to admission; pt is unable to give complete hx regarding prior functional status d/t baseline STM deficits Pt will benefit from skilled PT to increase their independence and safety with mobility to allow discharge to the venue listed below.       Follow Up Recommendations SNF    Equipment Recommendations  None recommended by PT    Recommendations for Other Services       Precautions / Restrictions Precautions Precautions: Fall Restrictions Weight Bearing Restrictions: No      Mobility  Bed Mobility Overal bed mobility: Needs Assistance Bed Mobility: Supine to Sit     Supine to sit: +2 for safety/equipment;+2 for physical assistance;Min assist     General bed mobility comments: assist with LEs, trunk, bed pad used to scoot to EOB  Transfers Overall transfer level: Needs assistance   Transfers: Lateral/Scoot Transfers          Lateral/Scoot Transfers: Max assist;+2 physical assistance;+2 safety/equipment;From elevated surface General transfer comment: assist with lateral scooting bed to chair, requires +2 for safety, blocking knees and with lifting; pt assists self with UEs and RLE   Ambulation/Gait              General Gait Details: non-amb at baseline  Stairs            Wheelchair Mobility    Modified Rankin (Stroke Patients Only)       Balance Overall balance assessment: Needs assistance;History of Falls Sitting-balance support: Feet supported Sitting balance-Leahy Scale: Fair         Standing balance comment: unable to stand at baseline                             Pertinent Vitals/Pain Pain Assessment: Faces Faces Pain Scale: Hurts a little bit Pain Location: nonspecific, grimaces with initiation of movement LEs Pain Descriptors / Indicators: Grimacing Pain Intervention(s): Monitored during session    Home Living Family/patient expects to be discharged to:: Assisted living                 Additional Comments: [name]--per chart review and previous notes    Prior Function Level of Independence: Needs assistance   Gait / Transfers Assistance Needed: patient states that he is w/c bound at baseline but can transfer in and out of w/c with  assist at ALF  ADL's / [place] Needed: caregiver 7days per week, 3 hours per day from previous notes (assist with ADLs)        Hand Dominance        Extremity/Trunk Assessment   Upper Extremity Assessment Upper Extremity Assessment: Defer to OT evaluation    Lower Extremity Assessment Lower Extremity Assessment: Generalized weakness;LLE deficits/detail;RLE deficits/detail RLE Deficits / Details: strength 3/5 grossly  LLE  Deficits / Details: strength grossly 2+/5 knee and hip; ankle 3/5       Communication   Communication: No difficulties  Cognition Arousal/Alertness: Awake/alert Behavior During Therapy: WFL for tasks assessed/performed Overall Cognitive Status: History of cognitive impairments - at baseline                                 General Comments: STM deficits at  baseline      General Comments      Exercises     Assessment/Plan    PT Assessment  Patient needs continued PT services  PT Problem List Decreased strength;Decreased activity tolerance;Decreased mobility;Pain;Decreased knowledge of use of DME       PT Treatment Interventions DME instruction;Functional mobility training;Therapeutic activities;Therapeutic exercise;Patient/family education;Gait training    PT Goals (Current goals can be found in the Care Plan section)  Acute Rehab PT Goals PT Goal Formulation: With patient Time For Goal Achievement: 01/31/18 Potential to Achieve Goals: Fair    Frequency Min 2X/week   Barriers to discharge        Co-evaluation PT/OT/SLP Co-Evaluation/Treatment: Yes Reason for Co-Treatment: For patient/therapist safety PT goals addressed during session: Mobility/safety with mobility         AM-PAC PT "6 Clicks" Mobility  Outcome Measure Help needed turning from your back to your side while in a flat bed without using bedrails?: A Lot Help needed moving from lying on your back to sitting on the side of a flat bed without using bedrails?: A Lot Help needed moving to and from a bed to a chair (including a wheelchair)?: A Lot Help needed standing up from a chair using your arms (e.g., wheelchair or bedside chair)?: A Lot Help needed to walk in hospital room?: Total Help needed climbing 3-5 steps with a railing? : Total 6 Click Score: 10    End of Session Equipment Utilized During Treatment: Gait belt Activity Tolerance: Patient tolerated treatment well Patient left: in chair;with call bell/phone within reach;with chair alarm set;Other (comment)(on maximove pad for back to bed)   PT Visit Diagnosis: Difficulty in walking, not elsewhere classified (R26.2)    Time: 1856-3149 PT Time Calculation (min) (ACUTE ONLY): 23 min   Charges:   PT Evaluation $PT Eval Low Complexity: 1 Low          Kenyon Ana, PT  Pager: [phone] Acute Rehab Dept [facility]): [phone]   01/17/2018   [facility] 01/17/2018, 1:10  PM

## 2018-01-17 NOTE — Progress Notes (Signed)
PHARMACY - PHYSICIAN COMMUNICATION CRITICAL VALUE ALERT - BLOOD CULTURE IDENTIFICATION (BCID)  Results for orders placed or performed during the hospital encounter of 01/16/18  Blood Culture ID Panel (Reflexed) (Collected: 01/16/2018  7:05 PM)  Result Value Ref Range   Enterococcus species NOT DETECTED NOT DETECTED   Listeria monocytogenes NOT DETECTED NOT DETECTED   Staphylococcus species DETECTED (A) NOT DETECTED   Staphylococcus aureus (BCID) DETECTED (A) NOT DETECTED   Methicillin resistance DETECTED (A) NOT DETECTED   Streptococcus species NOT DETECTED NOT DETECTED   Streptococcus agalactiae NOT DETECTED NOT DETECTED   Streptococcus pneumoniae NOT DETECTED NOT DETECTED   Streptococcus pyogenes NOT DETECTED NOT DETECTED   Acinetobacter baumannii NOT DETECTED NOT DETECTED   Enterobacteriaceae species NOT DETECTED NOT DETECTED   Enterobacter cloacae complex NOT DETECTED NOT DETECTED   Escherichia coli NOT DETECTED NOT DETECTED   Klebsiella oxytoca NOT DETECTED NOT DETECTED   Klebsiella pneumoniae NOT DETECTED NOT DETECTED   Proteus species NOT DETECTED NOT DETECTED   Serratia marcescens NOT DETECTED NOT DETECTED   Haemophilus influenzae NOT DETECTED NOT DETECTED   Neisseria meningitidis NOT DETECTED NOT DETECTED   Pseudomonas aeruginosa NOT DETECTED NOT DETECTED   Candida albicans NOT DETECTED NOT DETECTED   Candida glabrata NOT DETECTED NOT DETECTED   Candida krusei NOT DETECTED NOT DETECTED   Candida parapsilosis NOT DETECTED NOT DETECTED   Candida tropicalis NOT DETECTED NOT DETECTED    Name of physician (or Provider) Contacted: Dr. Zada Finders  Changes to prescribed antibiotics required: none  Romeo Rabon, PharmD. Mobile: [phone]. 01/17/2018,6:02 PM.

## 2018-01-18 ENCOUNTER — Inpatient Hospital Stay (HOSPITAL_COMMUNITY): Payer: Medicare Other

## 2018-01-18 DIAGNOSIS — N183 Chronic kidney disease, stage 3 (moderate): Secondary | ICD-10-CM

## 2018-01-18 DIAGNOSIS — Z888 Allergy status to other drugs, medicaments and biological substances status: Secondary | ICD-10-CM

## 2018-01-18 DIAGNOSIS — I361 Nonrheumatic tricuspid (valve) insufficiency: Secondary | ICD-10-CM

## 2018-01-18 DIAGNOSIS — E1151 Type 2 diabetes mellitus with diabetic peripheral angiopathy without gangrene: Secondary | ICD-10-CM

## 2018-01-18 DIAGNOSIS — E785 Hyperlipidemia, unspecified: Secondary | ICD-10-CM

## 2018-01-18 DIAGNOSIS — Z885 Allergy status to narcotic agent status: Secondary | ICD-10-CM

## 2018-01-18 DIAGNOSIS — I129 Hypertensive chronic kidney disease with stage 1 through stage 4 chronic kidney disease, or unspecified chronic kidney disease: Secondary | ICD-10-CM

## 2018-01-18 DIAGNOSIS — E1122 Type 2 diabetes mellitus with diabetic chronic kidney disease: Secondary | ICD-10-CM

## 2018-01-18 DIAGNOSIS — L89322 Pressure ulcer of left buttock, stage 2: Secondary | ICD-10-CM

## 2018-01-18 DIAGNOSIS — B351 Tinea unguium: Secondary | ICD-10-CM

## 2018-01-18 DIAGNOSIS — Z886 Allergy status to analgesic agent status: Secondary | ICD-10-CM

## 2018-01-18 DIAGNOSIS — E11621 Type 2 diabetes mellitus with foot ulcer: Secondary | ICD-10-CM

## 2018-01-18 DIAGNOSIS — I340 Nonrheumatic mitral (valve) insufficiency: Secondary | ICD-10-CM

## 2018-01-18 DIAGNOSIS — L03116 Cellulitis of left lower limb: Secondary | ICD-10-CM

## 2018-01-18 DIAGNOSIS — L97509 Non-pressure chronic ulcer of other part of unspecified foot with unspecified severity: Secondary | ICD-10-CM

## 2018-01-18 LAB — ECHOCARDIOGRAM COMPLETE
Height: 69.488 in
Weight: 3569.69 oz

## 2018-01-18 LAB — BASIC METABOLIC PANEL
Anion gap: 8 (ref 5–15)
BUN: 17 mg/dL (ref 8–23)
CO2: 26 mmol/L (ref 22–32)
Calcium: 8.9 mg/dL (ref 8.9–10.3)
Chloride: 106 mmol/L (ref 98–111)
Creatinine, Ser: 1.11 mg/dL (ref 0.61–1.24)
GFR calc Af Amer: >60 mL/min (ref >60)
GFR calc non Af Amer: >60 mL/min (ref >60)
Glucose, Bld: 179 mg/dL — ABNORMAL HIGH (ref 70–99)
Potassium: 4.2 mmol/L (ref 3.5–5.1)
Sodium: 140 mmol/L (ref 135–145)

## 2018-01-18 LAB — CBC
HCT: 40.2 % (ref 39.0–52.0)
Hemoglobin: 12 g/dL — ABNORMAL LOW (ref 13.0–17.0)
MCH: 28.7 pg (ref 26.0–34.0)
MCHC: 29.9 g/dL — ABNORMAL LOW (ref 30.0–36.0)
MCV: 96.2 fL (ref 80.0–100.0)
PLATELETS: 162 10*3/uL (ref 150–400)
RBC: 4.18 MIL/uL — ABNORMAL LOW (ref 4.22–5.81)
RDW: 14.6 % (ref 11.5–15.5)
WBC: 9.8 10*3/uL (ref 4.0–10.5)
nRBC: 0 % (ref 0.0–0.2)

## 2018-01-18 LAB — URINE CULTURE: Culture: >=100000 — AB

## 2018-01-18 LAB — GLUCOSE, CAPILLARY
Glucose-Capillary: 110 mg/dL — ABNORMAL HIGH (ref 70–99)
Glucose-Capillary: 143 mg/dL — ABNORMAL HIGH (ref 70–99)
Glucose-Capillary: 219 mg/dL — ABNORMAL HIGH (ref 70–99)
Glucose-Capillary: 244 mg/dL — ABNORMAL HIGH (ref 70–99)

## 2018-01-18 MED ORDER — PERFLUTREN LIPID MICROSPHERE
1.0000 mL | INTRAVENOUS | Status: AC | PRN
  Administered 2018-01-18: 2 mL via INTRAVENOUS
  Filled 2018-01-18: qty 10

## 2018-01-18 NOTE — Progress Notes (Signed)
  Echocardiogram 2D Echocardiogram has been performed.  Ricky [name] 01/18/2018, 2:31 PM

## 2018-01-18 NOTE — Plan of Care (Signed)
Patient denies pain or other complaint on 7 a to 7 p shift, states he is ready to go back to [facility].  Patient with "short term memory problems" as he tells nurse, asks the same questions repeatedly.  RN and tech did attempt to get patient up with walker, patient unable to stand and pivot even with 2 max assist.

## 2018-01-18 NOTE — Consult Note (Signed)
[facility] for Infectious Disease  Total days of antibiotics 3        Day 3 vanco/metronidazole       Reason for Consult: MRSA bacteremia   Referring Physician: goodrich  Principal Problem:   MRSA bacteremia Active Problems:   Diabetes mellitus without complication ([place])   Hypertension   Depression   Hyperlipidemia   Schizophrenia ([place])   Sepsis ([place])   Stage II pressure ulcer of left buttock (HCC)   Left leg cellulitis    HPI: Ricky [name]. is a 77 y.o. male with hx of DM with PVd, HTN, HLD, CKD3, schizophrenia admitted from assisted living on 12/27 for fever and AMS and GLF in the bathroom. He was found to be increasingly confused from his baseline. He was found to have fever of 101F. Tachypnea with hypoxia, mild tachycardia. Labs were revealing for leukocytosis of 22.9K with left shift. LA of 3.45. He was started on vancomycin, cefepime, and metronidazole as part of sepsis work up. His physical exam was noteable for left lower leg erythema/cellulitis, stage 2 pressure ulcer to buttock but looked relatively clean and dry diabetic foot ulcers, but debrided on 12/20 by podiatry. His infectious work up showed blood cx MRSA. He has had some improvement with leukocytosis since starting abtx. Resolution on pulmonary symptoms and back to his baseline, as well.  He recalls falling out of his wheelchair and denies loss of consciousness denies fever chills nightsweats. He reports his legs looking red over the last few weeks  Past Medical History:  Diagnosis Date  . Abnormality of gait   . Adenomatous colon polyp   . Arthritis   . Cataract   . Dementia ([place])   . Depression   . Diabetes mellitus without complication ([place])   . Fatty liver   . Hyperlipidemia   . Hypertension   . Internal hemorrhoids   . Other malaise and fatigue   . Peripheral edema   . Schizophrenia ([place])   . Type II or unspecified type diabetes mellitus without mention of complication, not stated as  uncontrolled   . Urinary frequency   . Urinary retention     Allergies:  Allergies  Allergen Reactions  . Asa [Aspirin]     "Dr told me not to take it"  . Codeine Other (See Comments)    DIZZINESS with Tylenol 3  . Tylenol [Acetaminophen] Other (See Comments)    Dizziness with tylenol 3    Current antibiotics:   MEDICATIONS: . ALPRAZolam  0.5 mg Oral QHS  . Chlorhexidine Gluconate Cloth  6 each Topical Q0600  . docusate sodium  100 mg Oral BID  . fluvoxaMINE  100 mg Oral QHS  . heparin  5,000 Units Subcutaneous Q8H  . insulin aspart  0-5 Units Subcutaneous QHS  . insulin aspart  0-9 Units Subcutaneous TID WC  . insulin detemir  30 Units Subcutaneous QHS  . mupirocin ointment  1 application Nasal BID  . OLANZapine  25 mg Oral QHS  . pantoprazole  40 mg Oral Daily  . pravastatin  40 mg Oral QPC lunch  . sodium chloride flush  3 mL Intravenous Q12H    Social History   Tobacco Use  . Smoking status: Never Smoker  . Smokeless tobacco: Never Used  Substance Use Topics  . Alcohol use: No  . Drug use: No    Family History  Problem Relation Age of Onset  . Brain cancer Father     Review  of Systems -  Review of Systems  Constitutional: Negative for fever, chills, diaphoresis, activity change, appetite change, fatigue and unexpected weight change.  HENT: Negative for congestion, sore throat, rhinorrhea, sneezing, trouble swallowing and sinus pressure.  Eyes: Negative for photophobia and visual disturbance.  Respiratory: Negative for cough, chest tightness, shortness of breath, wheezing and stridor.  Cardiovascular: Negative for chest pain, palpitations and leg swelling.  Gastrointestinal: Negative for nausea, vomiting, abdominal pain, diarrhea, constipation, blood in stool, abdominal distention and anal bleeding.  Genitourinary: Negative for dysuria, hematuria, flank pain and difficulty urinating.  Musculoskeletal: Negative for myalgias, back pain, joint swelling,  arthralgias and gait problem.  Skin: +redness of his legs Neurological: Negative for dizziness, tremors, weakness and light-headedness.  Hematological: Negative for adenopathy. Does not bruise/bleed easily.  Psychiatric/Behavioral: Negative for behavioral problems, confusion, sleep disturbance, dysphoric mood, decreased concentration and agitation.      OBJECTIVE: Temp:  [97.7 F (36.5 C)-98.6 F (37 C)] 98.6 F (37 C) (12/29 1449) Pulse Rate:  [61-74] 74 (12/29 1449) Resp:  [16-17] 16 (12/29 1449) BP: (124-139)/(67-87) 137/87 (12/29 1449) SpO2:  [91 %-94 %] 91 % (12/29 1449) Weight:  [101.2 kg] 101.2 kg (12/29 0500) Physical Exam  Constitutional: He is oriented to person, place, and time. He appears well-developed and well-nourished. No distress.  HENT:  Mouth/Throat: Oropharynx is clear and moist. No oropharyngeal exudate.  Cardiovascular: Normal rate, regular rhythm and normal heart sounds. Exam reveals no gallop and no friction rub.  No murmur heard.  Pulmonary/Chest: Effort normal and breath sounds normal. No respiratory distress. He has no wheezes.  Abdominal: Soft. Bowel sounds are normal. He exhibits no distension. There is no tenderness.  Lymphadenopathy:  He has no cervical adenopathy.  Neurological: He is alert and oriented to person, place, only Skin: Skin is warm and dry. Loss of hair to lower extremities. Left leg blanching erythema. Onychomycosis to toe nails. Small dry diabetic ulcer, none draining Psychiatric: He has a normal mood and affect. His behavior is normal.    LABS: Results for orders placed or performed during the hospital encounter of 01/16/18 (from the past 48 hour(s))  Comprehensive metabolic panel     Status: Abnormal   Collection Time: 01/16/18  7:04 PM  Result Value Ref Range   Sodium 137 135 - 145 mmol/L   Potassium 3.8 3.5 - 5.1 mmol/L   Chloride 97 (L) 98 - 111 mmol/L   CO2 26 22 - 32 mmol/L   Glucose, Bld 167 (H) 70 - 99 mg/dL   BUN 21  8 - 23 mg/dL   Creatinine, Ser 1.33 (H) 0.61 - 1.24 mg/dL   Calcium 9.1 8.9 - 10.3 mg/dL   Total Protein 8.2 (H) 6.5 - 8.1 g/dL   Albumin 4.1 3.5 - 5.0 g/dL   AST 31 15 - 41 U/L   ALT 16 0 - 44 U/L   Alkaline Phosphatase 94 38 - 126 U/L   Total Bilirubin 1.2 0.3 - 1.2 mg/dL   GFR calc non Af Amer 51 (L) >60 mL/min   GFR calc Af Amer 59 (L) >60 mL/min   Anion gap 14 5 - 15    Comment: Performed at [facility], [place] [address] [place], [place] [address]  CBC with Differential     Status: Abnormal   Collection Time: 01/16/18  7:04 PM  Result Value Ref Range   WBC 22.9 (H) 4.0 - 10.5 K/uL   RBC 4.66 4.22 - 5.81 MIL/uL   Hemoglobin  13.3 13.0 - 17.0 g/dL   HCT 42.7 39.0 - 52.0 %   MCV 91.6 80.0 - 100.0 fL   MCH 28.5 26.0 - 34.0 pg   MCHC 31.1 30.0 - 36.0 g/dL   RDW 14.5 11.5 - 15.5 %   Platelets 259 150 - 400 K/uL   nRBC 0.0 0.0 - 0.2 %   Neutrophils Relative % 90 %   Neutro Abs 20.6 (H) 1.7 - 7.7 K/uL   Lymphocytes Relative 2 %   Lymphs Abs 0.5 (L) 0.7 - 4.0 K/uL   Monocytes Relative 7 %   Monocytes Absolute 1.5 (H) 0.1 - 1.0 K/uL   Eosinophils Relative 0 %   Eosinophils Absolute 0.0 0.0 - 0.5 K/uL   Basophils Relative 0 %   Basophils Absolute 0.1 0.0 - 0.1 K/uL   Immature Granulocytes 1 %   Abs Immature Granulocytes 0.13 (H) 0.00 - 0.07 K/uL    Comment: Performed at [facility], [place] [address], [place], [place] [address]  Protime-INR     Status: None   Collection Time: 01/16/18  7:04 PM  Result Value Ref Range   Prothrombin Time 12.5 11.4 - 15.2 seconds   INR 0.94     Comment: Performed at [facility], [name] [address] [place], [place] [address]  Culture, blood (Routine x 2)     Status: Abnormal (Preliminary result)   Collection Time: 01/16/18  7:05 PM  Result Value Ref Range   Specimen Description      BLOOD LEFT FOREARM Performed at [facility] [address], [place], [place] [address]      Special Requests      BOTTLES DRAWN AEROBIC AND ANAEROBIC Blood Culture adequate volume Performed at [facility] [address] [place], [place] [address]    Culture  Setup Time      GRAM POSITIVE COCCI IN BOTH AEROBIC AND ANAEROBIC BOTTLES CRITICAL RESULT CALLED TO, READ BACK BY AND VERIFIED WITH:  [place], T [phone] [id number] FCP     Culture (A)     STAPHYLOCOCCUS AUREUS SUSCEPTIBILITIES TO FOLLOW Performed at [facility], [place] [address] [place], [place] [address]    Report Status PENDING   Blood Culture ID Panel (Reflexed)     Status: Abnormal   Collection Time: 01/16/18  7:05 PM  Result Value Ref Range   Enterococcus species NOT DETECTED NOT DETECTED   Listeria monocytogenes NOT DETECTED NOT DETECTED   Staphylococcus species DETECTED (A) NOT DETECTED    Comment: CRITICAL RESULT CALLED TO, READ BACK BY AND VERIFIED WITH:  PHARMD PICKERING, T [phone] FCP    Staphylococcus aureus (BCID) DETECTED (A) NOT DETECTED    Comment: Methicillin (oxacillin)-resistant Staphylococcus aureus (MRSA). MRSA is predictably resistant to beta-lactam antibiotics (except ceftaroline). Preferred therapy is vancomycin unless clinically contraindicated. Patient requires contact precautions if  hospitalized. CRITICAL RESULT CALLED TO, READ BACK BY AND VERIFIED WITH:  PHARMD PICKERING, T [phone] FCP    Methicillin resistance DETECTED (A) NOT DETECTED    Comment: CRITICAL RESULT CALLED TO, READ BACK BY AND VERIFIED WITH:  PHARMD PICKERING, T [id number] FCP    Streptococcus species NOT DETECTED NOT DETECTED   Streptococcus agalactiae NOT DETECTED NOT DETECTED   Streptococcus pneumoniae NOT DETECTED NOT DETECTED   Streptococcus pyogenes NOT DETECTED NOT DETECTED   Acinetobacter baumannii NOT DETECTED NOT DETECTED   Enterobacteriaceae species NOT DETECTED NOT DETECTED   Enterobacter cloacae complex NOT DETECTED  NOT DETECTED   Escherichia coli NOT DETECTED NOT  DETECTED   Klebsiella oxytoca NOT DETECTED NOT DETECTED   Klebsiella pneumoniae NOT DETECTED NOT DETECTED   Proteus species NOT DETECTED NOT DETECTED   Serratia marcescens NOT DETECTED NOT DETECTED   Haemophilus influenzae NOT DETECTED NOT DETECTED   Neisseria meningitidis NOT DETECTED NOT DETECTED   Pseudomonas aeruginosa NOT DETECTED NOT DETECTED   Candida albicans NOT DETECTED NOT DETECTED   Candida glabrata NOT DETECTED NOT DETECTED   Candida krusei NOT DETECTED NOT DETECTED   Candida parapsilosis NOT DETECTED NOT DETECTED   Candida tropicalis NOT DETECTED NOT DETECTED    Comment: Performed at [facility], [place] [address] [place], [place] [address]  Culture, blood (Routine x 2)     Status: None (Preliminary result)   Collection Time: 01/16/18  7:06 PM  Result Value Ref Range   Specimen Description      BLOOD LEFT ARM UPPER Performed at [place] [place] [address] [place], [place] [address]    Special Requests      BOTTLES DRAWN AEROBIC AND ANAEROBIC Blood Culture results may not be optimal due to an inadequate volume of blood received in culture bottles Performed at [facility], [place] [address], [place], [place] [address]    Culture  Setup Time      GRAM POSITIVE COCCI IN BOTH AEROBIC AND ANAEROBIC BOTTLES CRITICAL VALUE NOTED.  VALUE IS CONSISTENT WITH PREVIOUSLY REPORTED AND CALLED VALUE. Performed at [facility], [place] [address], [place], [place] [address]    Culture GRAM POSITIVE COCCI    Report Status PENDING   I-Stat CG4 Lactic Acid, ED     Status: Abnormal   Collection Time: 01/16/18  7:11 PM  Result Value Ref Range   Lactic Acid, Venous 3.45 (HH) 0.5 - 1.9 mmol/L   Comment NOTIFIED PHYSICIAN   I-stat troponin, ED (not at [facility])     Status: None   Collection Time: 01/16/18  9:16 PM  Result Value Ref Range   Troponin i, poc 0.04 0.00 - 0.08 ng/mL   Comment 3            Comment: Due to the release kinetics of  cTnI, a negative result within the first hours of the onset of symptoms does not rule out myocardial infarction with certainty. If myocardial infarction is still suspected, repeat the test at appropriate intervals.   I-Stat CG4 Lactic Acid, ED     Status: Abnormal   Collection Time: 01/16/18  9:18 PM  Result Value Ref Range   Lactic Acid, Venous 2.09 (HH) 0.5 - 1.9 mmol/L   Comment NOTIFIED PHYSICIAN   Urinalysis, Routine w reflex microscopic     Status: Abnormal   Collection Time: 01/16/18  9:43 PM  Result Value Ref Range   Color, Urine YELLOW YELLOW   APPearance CLEAR CLEAR   Specific Gravity, Urine 1.013 1.005 - 1.030   pH 6.0 5.0 - 8.0   Glucose, UA >=500 (A) NEGATIVE mg/dL   Hgb urine dipstick NEGATIVE NEGATIVE   Bilirubin Urine NEGATIVE NEGATIVE   Ketones, ur NEGATIVE NEGATIVE mg/dL   Protein, ur NEGATIVE NEGATIVE mg/dL   Nitrite NEGATIVE NEGATIVE   Leukocytes, UA NEGATIVE NEGATIVE   RBC / HPF 0-5 0 - 5 RBC/hpf   WBC, UA 0-5 0 - 5 WBC/hpf   Bacteria, UA FEW (A) NONE SEEN   Squamous Epithelial / LPF 0-5 0 - 5  Comment: Performed at [facility], [place] [address] [place], [place] [address]  Urine culture     Status: Abnormal   Collection Time: 01/16/18  9:43 PM  Result Value Ref Range   Specimen Description URINE, CLEAN CATCH    Special Requests      NONE Performed at [facility] [address], [place], [place] [address]    Culture (A)     >=100,000 COLONIES/mL GROUP B STREP(S.AGALACTIAE)ISOLATED TESTING AGAINST S. AGALACTIAE NOT ROUTINELY PERFORMED DUE TO PREDICTABILITY OF AMP/PEN/VAN SUSCEPTIBILITY.    Report Status 01/18/2018 FINAL   Glucose, capillary     Status: Abnormal   Collection Time: 01/17/18 12:23 AM  Result Value Ref Range   Glucose-Capillary 162 (H) 70 - 99 mg/dL   Comment 1 Notify RN   MRSA PCR Screening     Status: Abnormal   Collection Time: 01/17/18 12:56 AM  Result Value Ref Range   MRSA by PCR  POSITIVE (A) NEGATIVE    Comment:        The GeneXpert MRSA Assay (FDA approved for NASAL specimens only), is one component of a comprehensive MRSA colonization surveillance program. It is not intended to diagnose MRSA infection nor to guide or monitor treatment for MRSA infections. RESULT CALLED TO, READ BACK BY AND VERIFIED WITH: Idelia Salm RN _0  ON 01/17/18 Seward Speck Performed at [facility], [place] [address] [place], [place] [address]   Glucose, capillary     Status: Abnormal   Collection Time: 01/17/18  8:17 AM  Result Value Ref Range   Glucose-Capillary 102 (H) 70 - 99 mg/dL  Glucose, capillary     Status: Abnormal   Collection Time: 01/17/18 11:20 AM  Result Value Ref Range   Glucose-Capillary 143 (H) 70 - 99 mg/dL  Glucose, capillary     Status: Abnormal   Collection Time: 01/17/18  4:28 PM  Result Value Ref Range   Glucose-Capillary 129 (H) 70 - 99 mg/dL  Glucose, capillary     Status: Abnormal   Collection Time: 01/17/18  8:59 PM  Result Value Ref Range   Glucose-Capillary 156 (H) 70 - 99 mg/dL  CBC     Status: Abnormal   Collection Time: 01/18/18  6:02 AM  Result Value Ref Range   WBC 9.8 4.0 - 10.5 K/uL   RBC 4.18 (L) 4.22 - 5.81 MIL/uL   Hemoglobin 12.0 (L) 13.0 - 17.0 g/dL   HCT 40.2 39.0 - 52.0 %   MCV 96.2 80.0 - 100.0 fL   MCH 28.7 26.0 - 34.0 pg   MCHC 29.9 (L) 30.0 - 36.0 g/dL   RDW 14.6 11.5 - 15.5 %   Platelets 162 150 - 400 K/uL   nRBC 0.0 0.0 - 0.2 %    Comment: Performed at [facility], [place] [address] [place], [place] [address]  Basic metabolic panel     Status: Abnormal   Collection Time: 01/18/18  6:02 AM  Result Value Ref Range   Sodium 140 135 - 145 mmol/L   Potassium 4.2 3.5 - 5.1 mmol/L   Chloride 106 98 - 111 mmol/L   CO2 26 22 - 32 mmol/L   Glucose, Bld 179 (H) 70 - 99 mg/dL   BUN 17 8 - 23 mg/dL   Creatinine, Ser 1.11 0.61 - 1.24 mg/dL   Calcium 8.9 8.9 - 10.3 mg/dL   GFR calc non Af  Amer >60 >60 mL/min   GFR calc Af Amer >60 >  60 mL/min   Anion gap 8 5 - 15    Comment: Performed at [facility], [place] [address], [place], [place] [address]  Glucose, capillary     Status: Abnormal   Collection Time: 01/18/18  7:41 AM  Result Value Ref Range   Glucose-Capillary 110 (H) 70 - 99 mg/dL  Glucose, capillary     Status: Abnormal   Collection Time: 01/18/18 11:54 AM  Result Value Ref Range   Glucose-Capillary 143 (H) 70 - 99 mg/dL    MICRO:  IMAGING: Dg Chest 2 View  Result Date: 01/16/2018 CLINICAL DATA:  Fever with low O2 saturation EXAM: CHEST - 2 VIEW COMPARISON:  May 08, 2017 FINDINGS: The heart size and mediastinal contours are stable. Mild central pulmonary vessel prominence is noted. There is minimal left pleural effusion. There is no focal pneumonia. Minimal posterior pleural effusions are noted. The visualized skeletal structures are stable. IMPRESSION: No focal pneumonia. Mild central pulmonary vascular congestion. Minimal posterior pleural effusions. Electronically Signed   By: Abelardo Diesel M.D.   On: 01/16/2018 19:50   Ct Head Wo Contrast  Result Date: 01/16/2018 CLINICAL DATA:  Patient fell in bathroom and nursing facility. Fever. EXAM: CT HEAD WITHOUT CONTRAST CT CERVICAL SPINE WITHOUT CONTRAST TECHNIQUE: Multidetector CT imaging of the head and cervical spine was performed following the standard protocol without intravenous contrast. Multiplanar CT image reconstructions of the cervical spine were also generated. COMPARISON:  And CT 12/10/2012 and MRI 05/09/2017 FINDINGS: CT HEAD FINDINGS Brain: Stable age related involutional changes of brain with chronic small vessel ischemic disease. Negative for acute large vascular territory infarct, hemorrhage, intra-axial mass nor extra-axial fluid collections. No edema or midline shift. Vascular: No hyperdense vessel sign. Atherosclerosis of the skull base. Skull: Intact without skull fracture. No  suspicious osseous lesions. Sinuses/Orbits: Bilateral cataract extractions. Intact orbits and globes. Minimal ethmoid sinus mucosal thickening. Other: Clear mastoids. CT CERVICAL SPINE FINDINGS Alignment: Maintained cervical lordosis. Skull base and vertebrae: Motion related artifacts across the C6-7 interspace limit assessment. Intact skull base. Intact atlantodental interval and craniocervical relationship. No acute cervical spine fracture. Soft tissues and spinal canal: No prevertebral fluid or swelling. No visible canal hematoma. Disc levels: There is mild disc space narrowing from C2 through C6 and moderate at C6-7 through T1-T2. Minimal anterolisthesis grade 1 of C5 and C6 is identified. There is uncovertebral joint osteoarthritis and spurring greatest at C6-7 bilaterally. Bilateral multilevel degenerative facet arthropathy is seen. Upper chest: Clear lung apices. Other: Moderate extracranial carotid arteriosclerosis. IMPRESSION: 1. Chronic small vessel ischemic disease of periventricular white matter. No acute intracranial abnormality. 2. Cervical spondylosis without acute cervical spine fracture. Electronically Signed   By: Ashley Royalty M.D.   On: 01/16/2018 19:55   Ct Cervical Spine Wo Contrast  Result Date: 01/16/2018 CLINICAL DATA:  Patient fell in bathroom and nursing facility. Fever. EXAM: CT HEAD WITHOUT CONTRAST CT CERVICAL SPINE WITHOUT CONTRAST TECHNIQUE: Multidetector CT imaging of the head and cervical spine was performed following the standard protocol without intravenous contrast. Multiplanar CT image reconstructions of the cervical spine were also generated. COMPARISON:  And CT 12/10/2012 and MRI 05/09/2017 FINDINGS: CT HEAD FINDINGS Brain: Stable age related involutional changes of brain with chronic small vessel ischemic disease. Negative for acute large vascular territory infarct, hemorrhage, intra-axial mass nor extra-axial fluid collections. No edema or midline shift. Vascular: No  hyperdense vessel sign. Atherosclerosis of the skull base. Skull: Intact without skull fracture. No suspicious osseous lesions. Sinuses/Orbits: Bilateral cataract  extractions. Intact orbits and globes. Minimal ethmoid sinus mucosal thickening. Other: Clear mastoids. CT CERVICAL SPINE FINDINGS Alignment: Maintained cervical lordosis. Skull base and vertebrae: Motion related artifacts across the C6-7 interspace limit assessment. Intact skull base. Intact atlantodental interval and craniocervical relationship. No acute cervical spine fracture. Soft tissues and spinal canal: No prevertebral fluid or swelling. No visible canal hematoma. Disc levels: There is mild disc space narrowing from C2 through C6 and moderate at C6-7 through T1-T2. Minimal anterolisthesis grade 1 of C5 and C6 is identified. There is uncovertebral joint osteoarthritis and spurring greatest at C6-7 bilaterally. Bilateral multilevel degenerative facet arthropathy is seen. Upper chest: Clear lung apices. Other: Moderate extracranial carotid arteriosclerosis. IMPRESSION: 1. Chronic small vessel ischemic disease of periventricular white matter. No acute intracranial abnormality. 2. Cervical spondylosis without acute cervical spine fracture. Electronically Signed   By: Ashley Royalty M.D.   On: 01/16/2018 19:55   Dg Shoulder Left  Result Date: 01/16/2018 CLINICAL DATA:  Status post fall today with left shoulder pain. EXAM: LEFT SHOULDER - 2+ VIEW COMPARISON:  July 29, 2010 FINDINGS: There is osteophyte formation and high-riding left humerus. There is no acute fracture or dislocation. IMPRESSION: No acute fracture or dislocation. Electronically Signed   By: Abelardo Diesel M.D.   On: 01/16/2018 19:52    HISTORICAL MICRO/IMAGING  Assessment/Plan:  77yo M admitted for fever, AMS met sepsis criteria found to have MRSA bacteremia secondary to left leg cellulitis  - continue on vancomycin alone - repeat blood cx tomorrow - TTE has been performed await  read, but still may need TEE - would check sed rate and crp - if left leg does not have improvement in erythema in 2 days then consider to image looking for deep tissue abscess. - for health maintenance, please check hiv and hep C  Dr Johnnye Sima to see tomorrow

## 2018-01-18 NOTE — Progress Notes (Addendum)
PROGRESS NOTE  Ricky [name]. WUJ:[id number] DOB: February 24, 1940 DOA: 01/16/2018 PCP: Deland Pretty, MD  Brief Narrative: 77 year old man PMH including diabetes mellitus type 2, depression, schizophrenia, dementia presented after a fall and increasing confusion.  Noted to have fever, tachycardia and hypotension as well as hypoxia.  Admitted for sepsis unknown etiology.  Assessment/Plan Sepsis secondary to MRSA bacteremia, secondary to left lower extremity cellulitis with leukocytosis, hypotension and fever on admission.  Chest x-ray negative, urinalysis negative.  Stage II pressure ulcer of the buttocks and diabetic ulcers of both first toes not felt to be grossly infected on admission.  Recently had debridement of wounds on first toes and podiatry office 12/20. --Sepsis has resolved, hemodynamics are stable. --Narrow antibiotics to vancomycin to treat both bacteremia and urine. --Recheck blood cultures --Echocardiogram  Strep B bacteriuria.  Not clearly symptomatic but covered by vancomycin.  Acute hypoxic respiratory failure --Resolved.  Etiology unclear.  Diabetes mellitus type 2 --CBG remains stable.  Continue Levemir and sliding scale insulin.  Resume oral agents on discharge.  Hypertension, med medications on hold given --Monitor.  Stage II pressure ulcer left buttocks with diabetic ulcers bilateral first toes --Continue wound care  Dementia, schizophrenia --Stable.  Continue olanzapine, fluvoxamine, Xanax   Continue IV vancomycin, follow-up culture data, repeat blood cultures, check echocardiogram.  DVT prophylaxis: heparin Code Status: DNR Family Communication: none Disposition Plan: Likely return to assisted living facility in [place], [place]  [facility] Direct contact: [phone] --Via [place]  --[email or web address]; password [email or web address]  7PM-7AM contact night coverage as above 01/18/2018, 12:04 PM  LOS: 2 days    Consultants:    Procedures:    Antimicrobials:    Interval history/Subjective: Follow-up bacteremia  Feels very well, tolerating diet, breathing fine, no nausea or vomiting, no pain, no complaints  Objective: Vitals:  Vitals:   01/17/18 2058 01/18/18 0543  BP: 139/75 124/67  Pulse: 69 61  Resp: 17 16  Temp: 98 F (36.7 C) 97.7 F (36.5 C)  SpO2: 94% 93%    Exam:  Constitutional:   . Appears calm and comfortable ENT: Tongue appears unremarkable Respiratory:  . CTA bilaterally, no w/r/r.  . Respiratory effort normal. Cardiovascular:  . RRR, no m/r/g . No RLE extremity edema.  1+ left lower extremity edema. Abdomen:  . Abdomen soft Musculoskeletal:  . RLE, LLE   . Moves both extremities to command Skin:  . Left lower extremity erythema noted from above ankle to below knee.  Circumferential.  No open wounds or drainage.  No fluctuance.  Warm to touch.  Nontender. Psychiatric:  . Mental status o Mood, affect appropriate  I have personally reviewed the following:   Data: . BMP unremarkable.  WBC has normalized, 9.8.  Remainder CBC unremarkable.  CBG stable. . Urine culture greater than 100,000 colonies group B strep  Scheduled Meds: . ALPRAZolam  0.5 mg Oral QHS  . Chlorhexidine Gluconate Cloth  6 each Topical Q0600  . docusate sodium  100 mg Oral BID  . fluvoxaMINE  100 mg Oral QHS  . heparin  5,000 Units Subcutaneous Q8H  . insulin aspart  0-5 Units Subcutaneous QHS  . insulin aspart  0-9 Units Subcutaneous TID WC  . insulin detemir  30 Units Subcutaneous QHS  . mupirocin ointment  1 application Nasal BID  . OLANZapine  25 mg Oral QHS  . pantoprazole  40 mg Oral Daily  . pravastatin  40 mg Oral QPC lunch  . sodium chloride  flush  3 mL Intravenous Q12H   Continuous Infusions: . vancomycin 1,250 mg (01/18/18 0926)    Principal Problem:   MRSA bacteremia Active Problems:   Diabetes mellitus without complication (HCC)   Hypertension    Depression   Hyperlipidemia   Schizophrenia ([place])   Sepsis ([place])   Stage II pressure ulcer of left buttock (HCC)   Left leg cellulitis   LOS: 2 days

## 2018-01-19 ENCOUNTER — Ambulatory Visit: Payer: Medicare Other | Admitting: Psychiatry

## 2018-01-19 DIAGNOSIS — L539 Erythematous condition, unspecified: Secondary | ICD-10-CM

## 2018-01-19 DIAGNOSIS — F209 Schizophrenia, unspecified: Secondary | ICD-10-CM

## 2018-01-19 DIAGNOSIS — R7881 Bacteremia: Secondary | ICD-10-CM

## 2018-01-19 DIAGNOSIS — E119 Type 2 diabetes mellitus without complications: Secondary | ICD-10-CM

## 2018-01-19 DIAGNOSIS — B9562 Methicillin resistant Staphylococcus aureus infection as the cause of diseases classified elsewhere: Secondary | ICD-10-CM

## 2018-01-19 DIAGNOSIS — L89323 Pressure ulcer of left buttock, stage 3: Secondary | ICD-10-CM

## 2018-01-19 LAB — GLUCOSE, CAPILLARY
Glucose-Capillary: 109 mg/dL — ABNORMAL HIGH (ref 70–99)
Glucose-Capillary: 202 mg/dL — ABNORMAL HIGH (ref 70–99)
Glucose-Capillary: 162 mg/dL — ABNORMAL HIGH (ref 70–99)
Glucose-Capillary: 152 mg/dL — ABNORMAL HIGH (ref 70–99)

## 2018-01-19 LAB — CULTURE, BLOOD (ROUTINE X 2): SPECIAL REQUESTS: ADEQUATE

## 2018-01-19 MED ORDER — SODIUM CHLORIDE 0.9 % IV SOLN
INTRAVENOUS | Status: DC
  Administered 2018-01-20: 10:00:00 via INTRAVENOUS

## 2018-01-19 NOTE — Consult Note (Signed)
[facility] Nurse wound consult note Reason for Consult:Patient with chronic, healing Stage 3 pressure injuries to the bilateral buttocks, L>R and bilateral healing great toe wounds, L>R. Heels are in protective and preventive silicone foam dressings, no skin breakdown in those areas. Wound type: Pressure/Friction/Shear and neuropathic Pressure Injury POA: Yes Measurement: Left buttock, 3.2cm x 2cm x 0.1cm with macerated and jagged periwound tissue (evidence of chronic shear/friction injury). Dry red wound bed. Right buttock:  2cm x 1cm recently reepithelialized area Right Great toe lateral aspect callous measures 0.2cm round with no depth. Left great toe wound with surrounding callous:  0.4cm x 0.2cm with dark red wound bed, no drainage. Wound bed:As described above Drainage (amount, consistency, odor) As described above Periwound:intact, dry except for bilateral LEs which are erythematous and with scattered areas of dried serum (scabbing). Moderate warmth, no induration, mild edema. Dressing procedure/placement/frequency:I have provided Nursing with guidance via the Orders for care of the bilateral foot/great toe areas and the bilateral buttocks healing Stage 3 Pressure Injuries.  A pressure redistribution chair pad is ordered for his use when OOB in the chair and he is to take this with him upon discharge to facilitate resolution of the buttock lesions and to reduce the incidence of chronic shearing of the buttock tissues.  [facility] nursing team will not follow, but will remain available to this patient, the nursing and medical teams.  Please re-consult if needed. Thanks, Maudie Flakes, MSN, RN, [place], Arther Abbott  Pager# [phone]

## 2018-01-19 NOTE — Care Management Important Message (Signed)
Important Message  Patient Details  Name: Ricky [name]. MRN: [id number] Date of Birth: 1940/09/23   Medicare Important Message Given:  Yes    Kerin Salen 01/19/2018, 11:20 AMImportant Message  Patient Details  Name: Ricky [name]. MRN: [id number] Date of Birth: 1940-07-13   Medicare Important Message Given:  Yes    Kerin Salen 01/19/2018, 11:20 AM

## 2018-01-19 NOTE — H&P (View-Only) (Signed)
INFECTIOUS DISEASE PROGRESS NOTE  ID: Ricky [name]. is a 77 y.o. male with  Principal Problem:   MRSA bacteremia Active Problems:   Diabetes mellitus without complication ([place])   Hypertension   Depression   Hyperlipidemia   Schizophrenia ([place])   Sepsis ([place])   Left leg cellulitis   Stage III pressure ulcer of left buttock (HCC)  Subjective: Resting quietly.   Abtx:  Anti-infectives (From admission, onward)   Start     Dose/Rate Route Frequency Ordered Stop   01/17/18 0800  vancomycin (VANCOCIN) 1,250 mg in sodium chloride 0.9 % 250 mL IVPB     1,250 mg 166.7 mL/hr over 90 Minutes Intravenous Daily 01/17/18 0152     01/17/18 0400  ceFEPIme (MAXIPIME) 2 g in sodium chloride 0.9 % 100 mL IVPB  Status:  Discontinued     2 g 200 mL/hr over 30 Minutes Intravenous Every 8 hours 01/17/18 0139 01/18/18 1203   01/16/18 1915  ceFEPIme (MAXIPIME) 2 g in sodium chloride 0.9 % 100 mL IVPB     2 g 200 mL/hr over 30 Minutes Intravenous  Once 01/16/18 1909 01/16/18 2133   01/16/18 1915  metroNIDAZOLE (FLAGYL) IVPB 500 mg  Status:  Discontinued     500 mg 100 mL/hr over 60 Minutes Intravenous Every 8 hours 01/16/18 1909 01/18/18 1203   01/16/18 1915  vancomycin (VANCOCIN) IVPB 1000 mg/200 mL premix     1,000 mg 200 mL/hr over 60 Minutes Intravenous  Once 01/16/18 1909 01/16/18 2114      Medications:  Scheduled: . ALPRAZolam  0.5 mg Oral QHS  . Chlorhexidine Gluconate Cloth  6 each Topical Q0600  . docusate sodium  100 mg Oral BID  . fluvoxaMINE  100 mg Oral QHS  . heparin  5,000 Units Subcutaneous Q8H  . insulin aspart  0-5 Units Subcutaneous QHS  . insulin aspart  0-9 Units Subcutaneous TID WC  . insulin detemir  30 Units Subcutaneous QHS  . mupirocin ointment  1 application Nasal BID  . OLANZapine  25 mg Oral QHS  . pantoprazole  40 mg Oral Daily  . pravastatin  40 mg Oral QPC lunch  . sodium chloride flush  3 mL Intravenous Q12H    Objective: Vital signs in last 24  hours: Temp:  [98.6 F (37 C)-98.9 F (37.2 C)] 98.7 F (37.1 C) (12/30 1422) Pulse Rate:  [67-83] 74 (12/30 1422) Resp:  [14-18] 14 (12/30 1422) BP: (139-147)/(79-85) 146/82 (12/30 1422) SpO2:  [90 %-93 %] 93 % (12/30 1422) Weight:  [100.8 kg] 100.8 kg (12/30 0435)   General appearance: no distress Resp: clear to auscultation bilaterally Cardio: regular rate and rhythm GI: normal findings: bowel sounds normal and soft, non-tender Extremities: erythema of LLE below knee.   Lab Results Recent Labs    01/16/18 1904 01/18/18 0602  WBC 22.9* 9.8  HGB 13.3 12.0*  HCT 42.7 40.2  NA 137 140  K 3.8 4.2  CL 97* 106  CO2 26 26  BUN 21 17  CREATININE 1.33* 1.11   Liver Panel Recent Labs    01/16/18 1904  PROT 8.2*  ALBUMIN 4.1  AST 31  ALT 16  ALKPHOS 94  BILITOT 1.2   Sedimentation Rate No results for input(s): ESRSEDRATE in the last 72 hours. C-Reactive Protein No results for input(s): CRP in the last 72 hours.  Microbiology: Recent Results (from the past 240 hour(s))  Culture, blood (Routine x 2)     Status: Abnormal  Collection Time: 01/16/18  7:05 PM  Result Value Ref Range Status   Specimen Description   Final    BLOOD LEFT FOREARM Performed at [facility] [address], [place], [place] [address]    Special Requests   Final    BOTTLES DRAWN AEROBIC AND ANAEROBIC Blood Culture adequate volume Performed at [facility] [address] [place], [place] [address]    Culture  Setup Time   Final    GRAM POSITIVE COCCI IN BOTH AEROBIC AND ANAEROBIC BOTTLES CRITICAL RESULT CALLED TO, READ BACK BY AND VERIFIED WITH:  [place], T 1755 [id number] FCP  Performed at [facility], [place] [address], [place], [place] [address]    Culture METHICILLIN RESISTANT STAPHYLOCOCCUS AUREUS (A)  Final   Report Status 01/19/2018 FINAL  Final   Organism ID, Bacteria METHICILLIN RESISTANT STAPHYLOCOCCUS AUREUS  Final       Susceptibility   Methicillin resistant staphylococcus aureus - MIC*    CIPROFLOXACIN >=8 RESISTANT Resistant     ERYTHROMYCIN >=8 RESISTANT Resistant     GENTAMICIN <=0.5 SENSITIVE Sensitive     OXACILLIN >=4 RESISTANT Resistant     TETRACYCLINE <=1 SENSITIVE Sensitive     VANCOMYCIN <=0.5 SENSITIVE Sensitive     TRIMETH/SULFA <=10 SENSITIVE Sensitive     CLINDAMYCIN <=0.25 SENSITIVE Sensitive     RIFAMPIN <=0.5 SENSITIVE Sensitive     Inducible Clindamycin NEGATIVE Sensitive     * METHICILLIN RESISTANT STAPHYLOCOCCUS AUREUS  Blood Culture ID Panel (Reflexed)     Status: Abnormal   Collection Time: 01/16/18  7:05 PM  Result Value Ref Range Status   Enterococcus species NOT DETECTED NOT DETECTED Final   Listeria monocytogenes NOT DETECTED NOT DETECTED Final   Staphylococcus species DETECTED (A) NOT DETECTED Final    Comment: CRITICAL RESULT CALLED TO, READ BACK BY AND VERIFIED WITH:  PHARMD PICKERING, T 1755 [id number] FCP    Staphylococcus aureus (BCID) DETECTED (A) NOT DETECTED Final    Comment: Methicillin (oxacillin)-resistant Staphylococcus aureus (MRSA). MRSA is predictably resistant to beta-lactam antibiotics (except ceftaroline). Preferred therapy is vancomycin unless clinically contraindicated. Patient requires contact precautions if  hospitalized. CRITICAL RESULT CALLED TO, READ BACK BY AND VERIFIED WITH:  PHARMD PICKERING, T 1755 [id number] FCP    Methicillin resistance DETECTED (A) NOT DETECTED Final    Comment: CRITICAL RESULT CALLED TO, READ BACK BY AND VERIFIED WITH:  PHARMD PICKERING, T 1755 [id number] FCP    Streptococcus species NOT DETECTED NOT DETECTED Final   Streptococcus agalactiae NOT DETECTED NOT DETECTED Final   Streptococcus pneumoniae NOT DETECTED NOT DETECTED Final   Streptococcus pyogenes NOT DETECTED NOT DETECTED Final   Acinetobacter baumannii NOT DETECTED NOT DETECTED Final   Enterobacteriaceae species NOT DETECTED NOT DETECTED Final   Enterobacter cloacae  complex NOT DETECTED NOT DETECTED Final   Escherichia coli NOT DETECTED NOT DETECTED Final   Klebsiella oxytoca NOT DETECTED NOT DETECTED Final   Klebsiella pneumoniae NOT DETECTED NOT DETECTED Final   Proteus species NOT DETECTED NOT DETECTED Final   Serratia marcescens NOT DETECTED NOT DETECTED Final   Haemophilus influenzae NOT DETECTED NOT DETECTED Final   Neisseria meningitidis NOT DETECTED NOT DETECTED Final   Pseudomonas aeruginosa NOT DETECTED NOT DETECTED Final   Candida albicans NOT DETECTED NOT DETECTED Final   Candida glabrata NOT DETECTED NOT DETECTED Final   Candida krusei NOT DETECTED NOT DETECTED Final   Candida parapsilosis NOT DETECTED NOT DETECTED Final   Candida tropicalis  NOT DETECTED NOT DETECTED Final    Comment: Performed at [facility], [place] [address] [place], [place] [address]  Culture, blood (Routine x 2)     Status: Abnormal (Preliminary result)   Collection Time: 01/16/18  7:06 PM  Result Value Ref Range Status   Specimen Description   Final    BLOOD LEFT ARM UPPER Performed at [facility] [address] [place], [place] [address]    Special Requests   Final    BOTTLES DRAWN AEROBIC AND ANAEROBIC Blood Culture results may not be optimal due to an inadequate volume of blood received in culture bottles Performed at [facility] [address] [place], [place] [address]    Culture  Setup Time   Final    GRAM POSITIVE COCCI IN BOTH AEROBIC AND ANAEROBIC BOTTLES CRITICAL VALUE NOTED.  VALUE IS CONSISTENT WITH PREVIOUSLY REPORTED AND CALLED VALUE.    Culture (A)  Final    STAPHYLOCOCCUS AUREUS SUSCEPTIBILITIES PERFORMED ON PREVIOUS CULTURE WITHIN THE LAST 5 DAYS. Performed at [facility], [place] [address] [place], [place] [address]    Report Status PENDING  Incomplete  Urine culture     Status: Abnormal   Collection Time: 01/16/18  9:43 PM  Result Value Ref Range Status   Specimen Description  URINE, CLEAN CATCH  Final   Special Requests   Final    NONE Performed at [facility] [address] [place], [place] [address]    Culture (A)  Final    >=100,000 COLONIES/mL GROUP B STREP(S.AGALACTIAE)ISOLATED TESTING AGAINST S. AGALACTIAE NOT ROUTINELY PERFORMED DUE TO PREDICTABILITY OF AMP/PEN/VAN SUSCEPTIBILITY.    Report Status 01/18/2018 FINAL  Final  MRSA PCR Screening     Status: Abnormal   Collection Time: 01/17/18 12:56 AM  Result Value Ref Range Status   MRSA by PCR POSITIVE (A) NEGATIVE Final    Comment:        The GeneXpert MRSA Assay (FDA approved for NASAL specimens only), is one component of a comprehensive MRSA colonization surveillance program. It is not intended to diagnose MRSA infection nor to guide or monitor treatment for MRSA infections. RESULT CALLED TO, READ BACK BY AND VERIFIED WITH: Idelia Salm RN @0310  ON 01/17/18 Seward Speck Performed at [facility], [place] [address], [place], [place] [address]   Culture, blood (Routine X 2) w Reflex to ID Panel     Status: None (Preliminary result)   Collection Time: 01/18/18 12:44 PM  Result Value Ref Range Status   Specimen Description   Final    BLOOD RIGHT HAND Performed at [facility] [address], [place], [place] [address]    Special Requests   Final    BOTTLES DRAWN AEROBIC ONLY Blood Culture results may not be optimal due to an inadequate volume of blood received in culture bottles Performed at [facility] [address] [place], [place] [address]    Culture   Final    NO GROWTH < 24 HOURS Performed at [facility] [address] [place] [address] [place], [place] [address]    Report Status PENDING  Incomplete  Culture, blood (Routine X 2) w Reflex to ID Panel     Status: None (Preliminary result)   Collection Time: 01/18/18 12:54 PM  Result Value Ref Range Status   Specimen Description   Final    BLOOD LEFT HAND Performed at  [facility]  [address] [place], [place] [address]    Special Requests   Final    BOTTLES DRAWN AEROBIC ONLY Blood Culture adequate volume Performed at [facility] [address], [place], [place] [address]    Culture   Final    NO GROWTH < 24 HOURS Performed at [facility] [address], [place], [place] [address]    Report Status PENDING  Incomplete    Studies/Results: No results found.   Assessment/Plan: MRSA bacteremia UCx GBS DM2 Schizophrenia  Total days of antibiotics: 3 vanco  For TEE tomorrow to aide in determining length of therapy.  Still has some erythema on LLE but expect this to lag.  WBC dramatically better Repeat BCx 12-29 pending.          Bobby Rumpf MD, FACP Infectious Diseases (pager) [phone] [email or web address] 01/19/2018, 6:14 PM  LOS: 3 days

## 2018-01-19 NOTE — Progress Notes (Signed)
    [facility] has been requested to perform a transesophageal echocardiogram on Ricky [name]. for bacteremia.  After careful review of history and examination, the risks and benefits of transesophageal echocardiogram have been explained including risks of esophageal damage, perforation (1:10,000 risk), bleeding, pharyngeal hematoma as well as other potential complications associated with conscious sedation including aspiration, arrhythmia, respiratory failure and death. Alternatives to treatment were discussed, questions were answered. Patient is willing to proceed.   Pt has hx of esophageal strictures and is having problems swallowing hamburger, or apple pie, no all foods and no liquids.  Dr. Benson Norway is GI.  Last dilatation was 05/2015.    Cecilie Kicks, NP  01/19/2018 11:33 AM

## 2018-01-19 NOTE — Progress Notes (Signed)
INFECTIOUS DISEASE PROGRESS NOTE  ID: Ricky [name]. is a 77 y.o. male with  Principal Problem:   MRSA bacteremia Active Problems:   Diabetes mellitus without complication ([place])   Hypertension   Depression   Hyperlipidemia   Schizophrenia ([place])   Sepsis ([place])   Left leg cellulitis   Stage III pressure ulcer of left buttock (HCC)  Subjective: Resting quietly.   Abtx:  Anti-infectives (From admission, onward)   Start     Dose/Rate Route Frequency Ordered Stop   01/17/18 0800  vancomycin (VANCOCIN) 1,250 mg in sodium chloride 0.9 % 250 mL IVPB     1,250 mg 166.7 mL/hr over 90 Minutes Intravenous Daily 01/17/18 0152     01/17/18 0400  ceFEPIme (MAXIPIME) 2 g in sodium chloride 0.9 % 100 mL IVPB  Status:  Discontinued     2 g 200 mL/hr over 30 Minutes Intravenous Every 8 hours 01/17/18 0139 01/18/18 1203   01/16/18 1915  ceFEPIme (MAXIPIME) 2 g in sodium chloride 0.9 % 100 mL IVPB     2 g 200 mL/hr over 30 Minutes Intravenous  Once 01/16/18 1909 01/16/18 2133   01/16/18 1915  metroNIDAZOLE (FLAGYL) IVPB 500 mg  Status:  Discontinued     500 mg 100 mL/hr over 60 Minutes Intravenous Every 8 hours 01/16/18 1909 01/18/18 1203   01/16/18 1915  vancomycin (VANCOCIN) IVPB 1000 mg/200 mL premix     1,000 mg 200 mL/hr over 60 Minutes Intravenous  Once 01/16/18 1909 01/16/18 2114      Medications:  Scheduled: . ALPRAZolam  0.5 mg Oral QHS  . Chlorhexidine Gluconate Cloth  6 each Topical Q0600  . docusate sodium  100 mg Oral BID  . fluvoxaMINE  100 mg Oral QHS  . heparin  5,000 Units Subcutaneous Q8H  . insulin aspart  0-5 Units Subcutaneous QHS  . insulin aspart  0-9 Units Subcutaneous TID WC  . insulin detemir  30 Units Subcutaneous QHS  . mupirocin ointment  1 application Nasal BID  . OLANZapine  25 mg Oral QHS  . pantoprazole  40 mg Oral Daily  . pravastatin  40 mg Oral QPC lunch  . sodium chloride flush  3 mL Intravenous Q12H    Objective: Vital signs in last 24  hours: Temp:  [98.6 F (37 C)-98.9 F (37.2 C)] 98.7 F (37.1 C) (12/30 1422) Pulse Rate:  [67-83] 74 (12/30 1422) Resp:  [14-18] 14 (12/30 1422) BP: (139-147)/(79-85) 146/82 (12/30 1422) SpO2:  [90 %-93 %] 93 % (12/30 1422) Weight:  [100.8 kg] 100.8 kg (12/30 0435)   General appearance: no distress Resp: clear to auscultation bilaterally Cardio: regular rate and rhythm GI: normal findings: bowel sounds normal and soft, non-tender Extremities: erythema of LLE below knee.   Lab Results Recent Labs    01/16/18 1904 01/18/18 0602  WBC 22.9* 9.8  HGB 13.3 12.0*  HCT 42.7 40.2  NA 137 140  K 3.8 4.2  CL 97* 106  CO2 26 26  BUN 21 17  CREATININE 1.33* 1.11   Liver Panel Recent Labs    01/16/18 1904  PROT 8.2*  ALBUMIN 4.1  AST 31  ALT 16  ALKPHOS 94  BILITOT 1.2   Sedimentation Rate No results for input(s): ESRSEDRATE in the last 72 hours. C-Reactive Protein No results for input(s): CRP in the last 72 hours.  Microbiology: Recent Results (from the past 240 hour(s))  Culture, blood (Routine x 2)     Status: Abnormal  Collection Time: 01/16/18  7:05 PM  Result Value Ref Range Status   Specimen Description   Final    BLOOD LEFT FOREARM Performed at [facility] [address], [place], [place] [address]    Special Requests   Final    BOTTLES DRAWN AEROBIC AND ANAEROBIC Blood Culture adequate volume Performed at [facility] [address] [place], [place] [address]    Culture  Setup Time   Final    GRAM POSITIVE COCCI IN BOTH AEROBIC AND ANAEROBIC BOTTLES CRITICAL RESULT CALLED TO, READ BACK BY AND VERIFIED WITH:  [place], T 1755 [id number] FCP  Performed at [facility], [place] [address], [place], [place] [address]    Culture METHICILLIN RESISTANT STAPHYLOCOCCUS AUREUS (A)  Final   Report Status 01/19/2018 FINAL  Final   Organism ID, Bacteria METHICILLIN RESISTANT STAPHYLOCOCCUS AUREUS  Final       Susceptibility   Methicillin resistant staphylococcus aureus - MIC*    CIPROFLOXACIN >=8 RESISTANT Resistant     ERYTHROMYCIN >=8 RESISTANT Resistant     GENTAMICIN <=0.5 SENSITIVE Sensitive     OXACILLIN >=4 RESISTANT Resistant     TETRACYCLINE <=1 SENSITIVE Sensitive     VANCOMYCIN <=0.5 SENSITIVE Sensitive     TRIMETH/SULFA <=10 SENSITIVE Sensitive     CLINDAMYCIN <=0.25 SENSITIVE Sensitive     RIFAMPIN <=0.5 SENSITIVE Sensitive     Inducible Clindamycin NEGATIVE Sensitive     * METHICILLIN RESISTANT STAPHYLOCOCCUS AUREUS  Blood Culture ID Panel (Reflexed)     Status: Abnormal   Collection Time: 01/16/18  7:05 PM  Result Value Ref Range Status   Enterococcus species NOT DETECTED NOT DETECTED Final   Listeria monocytogenes NOT DETECTED NOT DETECTED Final   Staphylococcus species DETECTED (A) NOT DETECTED Final    Comment: CRITICAL RESULT CALLED TO, READ BACK BY AND VERIFIED WITH:  PHARMD PICKERING, T 1755 [id number] FCP    Staphylococcus aureus (BCID) DETECTED (A) NOT DETECTED Final    Comment: Methicillin (oxacillin)-resistant Staphylococcus aureus (MRSA). MRSA is predictably resistant to beta-lactam antibiotics (except ceftaroline). Preferred therapy is vancomycin unless clinically contraindicated. Patient requires contact precautions if  hospitalized. CRITICAL RESULT CALLED TO, READ BACK BY AND VERIFIED WITH:  PHARMD PICKERING, T 1755 [id number] FCP    Methicillin resistance DETECTED (A) NOT DETECTED Final    Comment: CRITICAL RESULT CALLED TO, READ BACK BY AND VERIFIED WITH:  PHARMD PICKERING, T 1755 [id number] FCP    Streptococcus species NOT DETECTED NOT DETECTED Final   Streptococcus agalactiae NOT DETECTED NOT DETECTED Final   Streptococcus pneumoniae NOT DETECTED NOT DETECTED Final   Streptococcus pyogenes NOT DETECTED NOT DETECTED Final   Acinetobacter baumannii NOT DETECTED NOT DETECTED Final   Enterobacteriaceae species NOT DETECTED NOT DETECTED Final   Enterobacter cloacae  complex NOT DETECTED NOT DETECTED Final   Escherichia coli NOT DETECTED NOT DETECTED Final   Klebsiella oxytoca NOT DETECTED NOT DETECTED Final   Klebsiella pneumoniae NOT DETECTED NOT DETECTED Final   Proteus species NOT DETECTED NOT DETECTED Final   Serratia marcescens NOT DETECTED NOT DETECTED Final   Haemophilus influenzae NOT DETECTED NOT DETECTED Final   Neisseria meningitidis NOT DETECTED NOT DETECTED Final   Pseudomonas aeruginosa NOT DETECTED NOT DETECTED Final   Candida albicans NOT DETECTED NOT DETECTED Final   Candida glabrata NOT DETECTED NOT DETECTED Final   Candida krusei NOT DETECTED NOT DETECTED Final   Candida parapsilosis NOT DETECTED NOT DETECTED Final   Candida tropicalis  NOT DETECTED NOT DETECTED Final    Comment: Performed at [facility], [place] [address] [place], [place] [address]  Culture, blood (Routine x 2)     Status: Abnormal (Preliminary result)   Collection Time: 01/16/18  7:06 PM  Result Value Ref Range Status   Specimen Description   Final    BLOOD LEFT ARM UPPER Performed at [facility] [address] [place], [place] [address]    Special Requests   Final    BOTTLES DRAWN AEROBIC AND ANAEROBIC Blood Culture results may not be optimal due to an inadequate volume of blood received in culture bottles Performed at [facility] [address], [place], [place] [address]    Culture  Setup Time   Final    GRAM POSITIVE COCCI IN BOTH AEROBIC AND ANAEROBIC BOTTLES CRITICAL VALUE NOTED.  VALUE IS CONSISTENT WITH PREVIOUSLY REPORTED AND CALLED VALUE.    Culture (A)  Final    STAPHYLOCOCCUS AUREUS SUSCEPTIBILITIES PERFORMED ON PREVIOUS CULTURE WITHIN THE LAST 5 DAYS. Performed at [facility], [place] [address], [place], [place] [address]    Report Status PENDING  Incomplete  Urine culture     Status: Abnormal   Collection Time: 01/16/18  9:43 PM  Result Value Ref Range Status   Specimen Description  URINE, CLEAN CATCH  Final   Special Requests   Final    NONE Performed at [facility] [address], [place], [place] [address]    Culture (A)  Final    >=100,000 COLONIES/mL GROUP B STREP(S.AGALACTIAE)ISOLATED TESTING AGAINST S. AGALACTIAE NOT ROUTINELY PERFORMED DUE TO PREDICTABILITY OF AMP/PEN/VAN SUSCEPTIBILITY.    Report Status 01/18/2018 FINAL  Final  MRSA PCR Screening     Status: Abnormal   Collection Time: 01/17/18 12:56 AM  Result Value Ref Range Status   MRSA by PCR POSITIVE (A) NEGATIVE Final    Comment:        The GeneXpert MRSA Assay (FDA approved for NASAL specimens only), is one component of a comprehensive MRSA colonization surveillance program. It is not intended to diagnose MRSA infection nor to guide or monitor treatment for MRSA infections. RESULT CALLED TO, READ BACK BY AND VERIFIED WITH: Idelia Salm RN @0310  ON 01/17/18 Seward Speck Performed at [facility], [place] [address], [place], [place] [address]   Culture, blood (Routine X 2) w Reflex to ID Panel     Status: None (Preliminary result)   Collection Time: 01/18/18 12:44 PM  Result Value Ref Range Status   Specimen Description   Final    BLOOD RIGHT HAND Performed at [facility] [address], [place], [place] [address]    Special Requests   Final    BOTTLES DRAWN AEROBIC ONLY Blood Culture results may not be optimal due to an inadequate volume of blood received in culture bottles Performed at [facility] [address], [place], [place] [address]    Culture   Final    NO GROWTH < 24 HOURS Performed at [facility] [address] [place], [place] [address]    Report Status PENDING  Incomplete  Culture, blood (Routine X 2) w Reflex to ID Panel     Status: None (Preliminary result)   Collection Time: 01/18/18 12:54 PM  Result Value Ref Range Status   Specimen Description   Final    BLOOD LEFT HAND Performed at  [facility]  [address] [place], [place] [address]    Special Requests   Final    BOTTLES DRAWN AEROBIC ONLY Blood Culture adequate volume Performed at [facility] [address], [place], [place] [address]    Culture   Final    NO GROWTH < 24 HOURS Performed at [facility] [address], [place], [place] [address]    Report Status PENDING  Incomplete    Studies/Results: No results found.   Assessment/Plan: MRSA bacteremia UCx GBS DM2 Schizophrenia  Total days of antibiotics: 3 vanco  For TEE tomorrow to aide in determining length of therapy.  Still has some erythema on LLE but expect this to lag.  WBC dramatically better Repeat BCx 12-29 pending.          Bobby Rumpf MD, FACP Infectious Diseases (pager) [phone] [email or web address] 01/19/2018, 6:14 PM  LOS: 3 days

## 2018-01-19 NOTE — Progress Notes (Signed)
Occupational Therapy Treatment Patient Details Name: Ricky [name]. MRN: [id number] DOB: August 03, 1940 Today's Date: 01/19/2018    History of present illness  77 y.o. male with medical history significant for type 2 diabetes, hypertension, hyperlipidemia, depression, dementia, schizophrenia, CKD stage III who presented the ED from his assisted living facility with  fall and change in mental status, fever--sepsis;   Left shoulder x-ray was negative for acute fracture or dislocation   OT comments  Pt making progress with functional goals. Pt completed bed mobility with min A to sit EOB. Pt sat EOB for grooming tasks min guard A and transferred to drop arm recliner by lateral scoot mod A. OT will continue to follow acutely  Follow Up Recommendations  SNF    Equipment Recommendations  3 in 1 bedside commode(drop arm 3 in 1)    Recommendations for Other Services      Precautions / Restrictions Precautions Precautions: Fall Restrictions Weight Bearing Restrictions: No       Mobility Bed Mobility Overal bed mobility: Needs Assistance Bed Mobility: Supine to Sit     Supine to sit: Min assist     General bed mobility comments: used rails and required increased time and effort to complete  Transfers Overall transfer level: Needs assistance   Transfers: Lateral/Scoot Transfers          Lateral/Scoot Transfers: Mod assist General transfer comment: attempted x 2 for squat pivot to recliner and pt unable. Lateral scoot to drop arm recliner    Balance Overall balance assessment: Needs assistance;History of Falls Sitting-balance support: Bilateral upper extremity supported;Feet supported Sitting balance-Leahy Scale: Fair         Standing balance comment: unable to stand at baseline                           ADL either performed or assessed with clinical judgement   ADL Overall ADL's : Needs assistance/impaired     Grooming: Wash/dry hands;Wash/dry  face;Min guard;Sitting Grooming Details (indicate cue type and reason): EOB                 Toilet Transfer: Moderate assistance Toilet Transfer Details (indicate cue type and reason): simulatedmod A lateral sccot to drop arm recliner         Functional mobility during ADLs: Moderate assistance(lateral scoot)       Vision Patient Visual Report: No change from baseline     Perception     Praxis      Cognition Arousal/Alertness: Awake/alert Behavior During Therapy: WFL for tasks assessed/performed Overall Cognitive Status: History of cognitive impairments - at baseline                                 General Comments: STM deficits at  baseline        Exercises     Shoulder Instructions       General Comments      Pertinent Vitals/ Pain       Pain Assessment: No/denies pain Pain Intervention(s): Monitored during session  Home Living                                          Prior Functioning/Environment              Frequency  Min  2X/week        Progress Toward Goals  OT Goals(current goals can now be found in the care plan section)  Progress towards OT goals: Progressing toward goals     Plan Discharge plan remains appropriate    Co-evaluation                 AM-PAC OT "6 Clicks" Daily Activity     Outcome Measure   Help from another person eating meals?: None Help from another person taking care of personal grooming?: A Little Help from another person toileting, which includes using toliet, bedpan, or urinal?: A Lot Help from another person bathing (including washing, rinsing, drying)?: A Lot Help from another person to put on and taking off regular upper body clothing?: A Little Help from another person to put on and taking off regular lower body clothing?: A Lot 6 Click Score: 16    End of Session Equipment Utilized During Treatment: Gait belt  OT Visit Diagnosis: Muscle weakness  (generalized) (M62.81);Other symptoms and signs involving cognitive function   Activity Tolerance Patient tolerated treatment well   Patient Left in chair;with call bell/phone within reach;with nursing/sitter in room   Nurse Communication          Time: 4801-6553 OT Time Calculation (min): 26 min  Charges: OT General Charges $OT Visit: 1 Visit OT Treatments $Self Care/Home Management : 8-22 mins $Therapeutic Activity: 8-22 mins     Britt Bottom 01/19/2018, 1:59 PM

## 2018-01-19 NOTE — Progress Notes (Signed)
PROGRESS NOTE  Ricky [name]. JHE:[id number] DOB: 08-14-1940 DOA: 01/16/2018 PCP: Deland Pretty, MD  Brief Narrative: 77 year old man PMH including diabetes mellitus type 2, depression, schizophrenia, dementia presented after a fall and increasing confusion.  Noted to have fever, tachycardia and hypotension as well as hypoxia.  Admitted for sepsis unknown etiology.  Subsequently found to have MRSA bacteremia and left lower extremity cellulitis.  Clinically much improved.  Plan for TEE 12/31.  Infectious disease following.  Assessment/Plan Sepsis secondary to MRSA bacteremia, secondary to left lower extremity cellulitis with leukocytosis, hypotension and fever on admission.  Chest x-ray negative, urinalysis negative.  Stage II pressure ulcer of the buttocks and diabetic ulcers of both first toes not felt to be grossly infected on admission.  Recently had debridement of wounds on first toes and podiatry office 12/20. --Sepsis resolved.   --Continue vancomycin, follow-up repeat blood cultures.   Sepsis has resolved, hemodynamics are stable. --TTE inconclusive.  Will request transesophageal echocardiogram.  Diabetes mellitus type 2 --CBG stable.  Continue Levemir and sliding scale insulin.  Resume oral agents on discharge.  Strep B bacteriuria.  Not clearly symptomatic but covered by vancomycin.  Acute hypoxic respiratory failure --Resolved.  Etiology unclear.  Hypertension --Remains stable.  Stage III pressure ulcer bilateral buttocks with diabetic ulcers bilateral first toes --Continue wound care --pressure redistribution chair pad is ordered for his use when OOB in the chair and he is to take this with him upon discharge to facilitate resolution of the buttock lesions and to reduce the incidence of chronic shearing of the buttock tissues.  Dementia, schizophrenia --Remains stable.  Continue olanzapine, fluvoxamine, Xanax   Continues to improve.  Continue vancomycin.  Check  transesophageal echocardiogram.  DVT prophylaxis: heparin Code Status: DNR Family Communication: none Disposition Plan: Likely return to assisted living facility in [place]  [facility] Direct contact: [phone] --[place]ia [place]  --[email or web address]; password [email or web address]  7PM-7AM contact night coverage as above 01/19/2018, 3:14 PM  LOS: 3 days   Consultants:    Procedures:  Echo Study Conclusions  - Left ventricle: The cavity size was normal. Wall thickness was   increased in a pattern of moderate LVH. Systolic function was   normal. The estimated ejection fraction was in the range of 55%   to 60%. - Mitral valve: There was mild regurgitation.  Impressions:  - Echo windows are inadequate to evaluate for endocarditis. TEE is   recommended if clinically indicated .  Antimicrobials:    Interval history/Subjective: Follow-up bacteremia  Feels well, no complaints.  No leg pain.  Tolerating diet.  Objective: Vitals:  Vitals:   01/19/18 0435 01/19/18 1422  BP: 139/85 (!) 146/82  Pulse: 67 74  Resp: 18 14  Temp: 98.9 F (37.2 C) 98.7 F (37.1 C)  SpO2: 93% 93%    Exam: Constitutional:   . Appears calm and comfortable Respiratory:  . CTA bilaterally, no w/r/r.  . Respiratory effort normal.  Cardiovascular:  . RRR, no m/r/g . No LE extremity edema   Skin:  . Significantly less erythema left lower extremity today.  Less heat.  Nontender to palpation.  No open wounds in the area of erythema.  Wounds both great toes examined, dry, no evidence of infection. Psychiatric:  . Mental status o Mood, affect appropriate  I have personally reviewed the following:   Data: . CBG stable  Scheduled Meds: . ALPRAZolam  0.5 mg Oral QHS  . Chlorhexidine Gluconate Cloth  6  each Topical Q0600  . docusate sodium  100 mg Oral BID  . fluvoxaMINE  100 mg Oral QHS  . heparin  5,000 Units Subcutaneous Q8H  . insulin aspart  0-5 Units  Subcutaneous QHS  . insulin aspart  0-9 Units Subcutaneous TID WC  . insulin detemir  30 Units Subcutaneous QHS  . mupirocin ointment  1 application Nasal BID  . OLANZapine  25 mg Oral QHS  . pantoprazole  40 mg Oral Daily  . pravastatin  40 mg Oral QPC lunch  . sodium chloride flush  3 mL Intravenous Q12H   Continuous Infusions: . [START ON 01/20/2018] sodium chloride    . vancomycin 1,250 mg (01/19/18 0957)    Principal Problem:   MRSA bacteremia Active Problems:   Diabetes mellitus without complication ([place])   Hypertension   Depression   Hyperlipidemia   Schizophrenia ([place])   Sepsis ([place])   Stage II pressure ulcer of left buttock ([facility])   Left leg cellulitis   LOS: 3 days

## 2018-01-20 ENCOUNTER — Inpatient Hospital Stay (HOSPITAL_COMMUNITY): Payer: Medicare Other

## 2018-01-20 ENCOUNTER — Encounter (HOSPITAL_COMMUNITY)
Admission: EM | Disposition: A | Discharge status: Skilled Nursing Facility | Payer: Self-pay | Source: Home / Self Care | Attending: Family Medicine

## 2018-01-20 ENCOUNTER — Ambulatory Visit: Payer: Medicare Other | Admitting: Psychiatry

## 2018-01-20 DIAGNOSIS — I340 Nonrheumatic mitral (valve) insufficiency: Secondary | ICD-10-CM

## 2018-01-20 HISTORY — PX: TEE WITHOUT CARDIOVERSION: SHX5443

## 2018-01-20 LAB — GLUCOSE, CAPILLARY
Glucose-Capillary: 140 mg/dL — ABNORMAL HIGH (ref 70–99)
Glucose-Capillary: 87 mg/dL (ref 70–99)
Glucose-Capillary: 125 mg/dL — ABNORMAL HIGH (ref 70–99)
Glucose-Capillary: 162 mg/dL — ABNORMAL HIGH (ref 70–99)
Glucose-Capillary: 150 mg/dL — ABNORMAL HIGH (ref 70–99)

## 2018-01-20 LAB — BASIC METABOLIC PANEL
Anion gap: 9 (ref 5–15)
BUN: 17 mg/dL (ref 8–23)
CO2: 28 mmol/L (ref 22–32)
Calcium: 9.1 mg/dL (ref 8.9–10.3)
Chloride: 104 mmol/L (ref 98–111)
Creatinine, Ser: 0.98 mg/dL (ref 0.61–1.24)
GFR calc Af Amer: >60 mL/min (ref >60)
GFR calc non Af Amer: >60 mL/min (ref >60)
Glucose, Bld: 114 mg/dL — ABNORMAL HIGH (ref 70–99)
Potassium: 3.8 mmol/L (ref 3.5–5.1)
Sodium: 141 mmol/L (ref 135–145)

## 2018-01-20 LAB — HIV ANTIBODY (ROUTINE TESTING W REFLEX): HIV Screen 4th Generation wRfx: NONREACTIVE

## 2018-01-20 LAB — PROTIME-INR
INR: 1
Prothrombin Time: 13.1 seconds (ref 11.4–15.2)

## 2018-01-20 LAB — CULTURE, BLOOD (ROUTINE X 2)

## 2018-01-20 SURGERY — ECHOCARDIOGRAM, TRANSESOPHAGEAL
Anesthesia: Moderate Sedation

## 2018-01-20 MED ORDER — DIPHENHYDRAMINE HCL 50 MG/ML IJ SOLN
INTRAMUSCULAR | Status: AC
  Filled 2018-01-20: qty 1

## 2018-01-20 MED ORDER — VANCOMYCIN HCL 10 G IV SOLR
1250.0000 mg | INTRAVENOUS | Status: AC
  Administered 2018-01-20: 1250 mg via INTRAVENOUS
  Filled 2018-01-20 (×2): qty 1250

## 2018-01-20 MED ORDER — FENTANYL CITRATE (PF) 100 MCG/2ML IJ SOLN
INTRAMUSCULAR | Status: DC | PRN
  Administered 2018-01-20: 25 ug via INTRAVENOUS
  Administered 2018-01-20 (×2): 12.5 ug via INTRAVENOUS

## 2018-01-20 MED ORDER — MIDAZOLAM HCL (PF) 5 MG/ML IJ SOLN
INTRAMUSCULAR | Status: AC
  Filled 2018-01-20: qty 2

## 2018-01-20 MED ORDER — FENTANYL CITRATE (PF) 100 MCG/2ML IJ SOLN
INTRAMUSCULAR | Status: AC
  Filled 2018-01-20: qty 2

## 2018-01-20 MED ORDER — BUTAMBEN-TETRACAINE-BENZOCAINE 2-2-14 % EX AERO
INHALATION_SPRAY | CUTANEOUS | Status: DC | PRN
  Administered 2018-01-20: 2 via TOPICAL

## 2018-01-20 MED ORDER — MIDAZOLAM HCL (PF) 10 MG/2ML IJ SOLN
INTRAMUSCULAR | Status: DC | PRN
  Administered 2018-01-20 (×2): 1 mg via INTRAVENOUS
  Administered 2018-01-20: 2 mg via INTRAVENOUS

## 2018-01-20 NOTE — Progress Notes (Addendum)
INFECTIOUS DISEASE PROGRESS NOTE  ID: Ricky [name]. is a 77 y.o. 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   Abtx:  Anti-infectives (From admission, onward)   Start     Dose/Rate Route Frequency Ordered Stop   01/20/18 1500  vancomycin (VANCOCIN) 1,250 mg in sodium chloride 0.9 % 250 mL IVPB     1,250 mg 166.7 mL/hr over 90 Minutes Intravenous NOW 01/20/18 1449 01/21/18 1500   01/17/18 0800  vancomycin (VANCOCIN) 1,250 mg in sodium chloride 0.9 % 250 mL IVPB     1,250 mg 166.7 mL/hr over 90 Minutes Intravenous Daily 01/17/18 0152     01/17/18 0400  ceFEPIme (MAXIPIME) 2 g in sodium chloride 0.9 % 100 mL IVPB  Status:  Discontinued     2 g 200 mL/hr over 30 Minutes Intravenous Every 8 hours 01/17/18 0139 01/18/18 1203   01/16/18 1915  ceFEPIme (MAXIPIME) 2 g in sodium chloride 0.9 % 100 mL IVPB     2 g 200 mL/hr over 30 Minutes Intravenous  Once 01/16/18 1909 01/16/18 2133   01/16/18 1915  metroNIDAZOLE (FLAGYL) IVPB 500 mg  Status:  Discontinued     500 mg 100 mL/hr over 60 Minutes Intravenous Every 8 hours 01/16/18 1909 01/18/18 1203   01/16/18 1915  vancomycin (VANCOCIN) IVPB 1000 mg/200 mL premix     1,000 mg 200 mL/hr over 60 Minutes Intravenous  Once 01/16/18 1909 01/16/18 2114      Medications:  Scheduled: . ALPRAZolam  0.5 mg Oral QHS  . Chlorhexidine Gluconate Cloth  6 each Topical Q0600  . docusate sodium  100 mg Oral BID  . fluvoxaMINE  100 mg Oral QHS  . heparin  5,000 Units Subcutaneous Q8H  . insulin aspart  0-5 Units Subcutaneous QHS  . insulin aspart  0-9 Units Subcutaneous TID WC  . insulin detemir  30 Units Subcutaneous QHS  . mupirocin  ointment  1 application Nasal BID  . OLANZapine  25 mg Oral QHS  . pantoprazole  40 mg Oral Daily  . pravastatin  40 mg Oral QPC lunch  . sodium chloride flush  3 mL Intravenous Q12H    Objective: Vital signs in last 24 hours: Temp:  [98.1 F (36.7 C)-99.3 F (37.4 C)] 98.2 F (36.8 C) (12/31 1440) Pulse Rate:  [51-77] 62 (12/31 1440) Resp:  [10-20] 18 (12/31 1440) BP: (123-155)/(45-95) 125/60 (12/31 1440) SpO2:  [92 %-98 %] 95 % (12/31 1440) Weight:  [99.6 kg] 99.6 kg (12/31 0957)   General appearance: alert, cooperative, no distress and confused Extremities: erythema on LLE  Lab Results Recent Labs    01/18/18 0602 01/20/18 0629  WBC 9.8  --   HGB 12.0*  --   HCT 40.2  --   NA 140 141  K 4.2 3.8  CL 106 104  CO2 26 28  BUN 17 17  CREATININE 1.11 0.98   Liver Panel No results for input(s): PROT, ALBUMIN, AST, ALT, ALKPHOS, BILITOT, BILIDIR, IBILI in the last 72 hours. Sedimentation Rate No results for input(s): ESRSEDRATE in the last 72 hours. C-Reactive Protein No results for input(s): CRP in the last  72 hours.  Microbiology: Recent Results (from the past 240 hour(s))  Culture, blood (Routine x 2)     Status: Abnormal   Collection Time: 01/16/18  7:05 PM  Result Value Ref Range Status   Specimen Description   Final    BLOOD LEFT FOREARM Performed at [facility], [place] [address] [place], [place] [address]    Special Requests   Final    BOTTLES DRAWN AEROBIC AND ANAEROBIC Blood Culture adequate volume Performed at [facility] [address], [place], [place] [address]    Culture  Setup Time   Final    GRAM POSITIVE COCCI IN BOTH AEROBIC AND ANAEROBIC BOTTLES CRITICAL RESULT CALLED TO, READ BACK BY AND VERIFIED WITH:  [place], T [id number] FCP  Performed at [facility], [place] [address] [place], [place] [address]    Culture METHICILLIN RESISTANT STAPHYLOCOCCUS AUREUS (A)  Final   Report Status  01/19/2018 FINAL  Final   Organism ID, Bacteria METHICILLIN RESISTANT STAPHYLOCOCCUS AUREUS  Final      Susceptibility   Methicillin resistant staphylococcus aureus - MIC*    CIPROFLOXACIN >=8 RESISTANT Resistant     ERYTHROMYCIN >=8 RESISTANT Resistant     GENTAMICIN <=0.5 SENSITIVE Sensitive     OXACILLIN >=4 RESISTANT Resistant     TETRACYCLINE <=1 SENSITIVE Sensitive     VANCOMYCIN <=0.5 SENSITIVE Sensitive     TRIMETH/SULFA <=10 SENSITIVE Sensitive     CLINDAMYCIN <=0.25 SENSITIVE Sensitive     RIFAMPIN <=0.5 SENSITIVE Sensitive     Inducible Clindamycin NEGATIVE Sensitive     * METHICILLIN RESISTANT STAPHYLOCOCCUS AUREUS  Blood Culture ID Panel (Reflexed)     Status: Abnormal   Collection Time: 01/16/18  7:05 PM  Result Value Ref Range Status   Enterococcus species NOT DETECTED NOT DETECTED Final   Listeria monocytogenes NOT DETECTED NOT DETECTED Final   Staphylococcus species DETECTED (A) NOT DETECTED Final    Comment: CRITICAL RESULT CALLED TO, READ BACK BY AND VERIFIED WITH:  PHARMD PICKERING, T [id number] FCP    Staphylococcus aureus (BCID) DETECTED (A) NOT DETECTED Final    Comment: Methicillin (oxacillin)-resistant Staphylococcus aureus (MRSA). MRSA is predictably resistant to beta-lactam antibiotics (except ceftaroline). Preferred therapy is vancomycin unless clinically contraindicated. Patient requires contact precautions if  hospitalized. CRITICAL RESULT CALLED TO, READ BACK BY AND VERIFIED WITH:  PHARMD PICKERING, T 1755 [id number] FCP    Methicillin resistance DETECTED (A) NOT DETECTED Final    Comment: CRITICAL RESULT CALLED TO, READ BACK BY AND VERIFIED WITH:  PHARMD PICKERING, T 1755 [id number] FCP    Streptococcus species NOT DETECTED NOT DETECTED Final   Streptococcus agalactiae NOT DETECTED NOT DETECTED Final   Streptococcus pneumoniae NOT DETECTED NOT DETECTED Final   Streptococcus pyogenes NOT DETECTED NOT DETECTED Final   Acinetobacter baumannii NOT DETECTED  NOT DETECTED Final   Enterobacteriaceae species NOT DETECTED NOT DETECTED Final   Enterobacter cloacae complex NOT DETECTED NOT DETECTED Final   Escherichia coli NOT DETECTED NOT DETECTED Final   Klebsiella oxytoca NOT DETECTED NOT DETECTED Final   Klebsiella pneumoniae NOT DETECTED NOT DETECTED Final   Proteus species NOT DETECTED NOT DETECTED Final   Serratia marcescens NOT DETECTED NOT DETECTED Final   Haemophilus influenzae NOT DETECTED NOT DETECTED Final   Neisseria meningitidis NOT DETECTED NOT DETECTED Final   Pseudomonas aeruginosa NOT DETECTED NOT DETECTED Final   Candida albicans NOT DETECTED NOT DETECTED Final   Candida glabrata NOT DETECTED  NOT DETECTED Final   Candida krusei NOT DETECTED NOT DETECTED Final   Candida parapsilosis NOT DETECTED NOT DETECTED Final   Candida tropicalis NOT DETECTED NOT DETECTED Final    Comment: Performed at [facility], [address] [place], [place] [address]  Culture, blood (Routine x 2)     Status: Abnormal   Collection Time: 01/16/18  7:06 PM  Result Value Ref Range Status   Specimen Description   Final    BLOOD LEFT ARM UPPER Performed at [facility] [address] [place], [place] [address]    Special Requests   Final    BOTTLES DRAWN AEROBIC AND ANAEROBIC Blood Culture results may not be optimal due to an inadequate volume of blood received in culture bottles Performed at [facility] [address], [place], [place] [address]    Culture  Setup Time   Final    GRAM POSITIVE COCCI IN BOTH AEROBIC AND ANAEROBIC BOTTLES CRITICAL VALUE NOTED.  VALUE IS CONSISTENT WITH PREVIOUSLY REPORTED AND CALLED VALUE.    Culture (A)  Final    STAPHYLOCOCCUS AUREUS SUSCEPTIBILITIES PERFORMED ON PREVIOUS CULTURE WITHIN THE LAST 5 DAYS. STAPHYLOCOCCUS SPECIES (COAGULASE NEGATIVE) THE SIGNIFICANCE OF ISOLATING THIS ORGANISM FROM A SINGLE SET OF BLOOD CULTURES WHEN MULTIPLE SETS ARE DRAWN IS UNCERTAIN.  PLEASE NOTIFY THE MICROBIOLOGY DEPARTMENT WITHIN ONE WEEK IF SPECIATION AND SENSITIVITIES ARE REQUIRED. Performed at [facility], [place] [address] [place], [place] [address]    Report Status 01/20/2018 FINAL  Final  Urine culture     Status: Abnormal   Collection Time: 01/16/18  9:43 PM  Result Value Ref Range Status   Specimen Description URINE, CLEAN CATCH  Final   Special Requests   Final    NONE Performed at [facility] [address] [place], [place] [address]    Culture (A)  Final    >=100,000 COLONIES/mL GROUP B STREP(S.AGALACTIAE)ISOLATED TESTING AGAINST S. AGALACTIAE NOT ROUTINELY PERFORMED DUE TO PREDICTABILITY OF AMP/PEN/VAN SUSCEPTIBILITY.    Report Status 01/18/2018 FINAL  Final  MRSA PCR Screening     Status: Abnormal   Collection Time: 01/17/18 12:56 AM  Result Value Ref Range Status   MRSA by PCR POSITIVE (A) NEGATIVE Final    Comment:        The GeneXpert MRSA Assay (FDA approved for NASAL specimens only), is one component of a comprehensive MRSA colonization surveillance program. It is not intended to diagnose MRSA infection nor to guide or monitor treatment for MRSA infections. RESULT CALLED TO, READ BACK BY AND VERIFIED WITH: Idelia Salm RN @0310  ON 01/17/18 Seward Speck Performed at [facility], [place] [address], [place], [place] [address]   Culture, blood (Routine X 2) w Reflex to ID Panel     Status: None (Preliminary result)   Collection Time: 01/18/18 12:44 PM  Result Value Ref Range Status   Specimen Description   Final    BLOOD RIGHT HAND Performed at [facility] [address], [place], [place] [address]    Special Requests   Final    BOTTLES DRAWN AEROBIC ONLY Blood Culture results may not be optimal due to an inadequate volume of blood received in culture bottles Performed at [facility] [address], [place], [place] [address]    Culture   Final    NO  GROWTH 2 DAYS Performed at [facility] [address], [place], [place] [address]  Report Status PENDING  Incomplete  Culture, blood (Routine X 2) w Reflex to ID Panel     Status: None (Preliminary result)   Collection Time: 01/18/18 12:54 PM  Result Value Ref Range Status   Specimen Description   Final    BLOOD LEFT HAND Performed at [facility] [address], [place], [place] [address]    Special Requests   Final    BOTTLES DRAWN AEROBIC ONLY Blood Culture adequate volume Performed at [facility] [address] [place], [place] [address]    Culture   Final    NO GROWTH 2 DAYS Performed at [facility] [address], [place], [place] [address]    Report Status PENDING  Incomplete    Studies/Results: No results found.   Assessment/Plan: MRSA bacteremia Cellulitis UCx GBS DM2 Schizophrenia  Total days of antibiotics: 4 vanco  TEE (-) Would continue vanco to complete 14 days He wants to return to his prev SNF If not able to place [facility], have him d/c with IV anbx could consider one time dose of oritavancin. Discussed with pharmacy and med is available.          Bobby Rumpf MD, FACP Infectious Diseases (pager) [phone] [email or web address] 01/20/2018, 5:06 PM  LOS: 4 days

## 2018-01-20 NOTE — Interval H&P Note (Signed)
History and Physical Interval Note:  01/20/2018 11:21 AM  Ricky [name].  has presented today for surgery, with the diagnosis of BACTEREMIA  The various methods of treatment have been discussed with the patient and family. After consideration of risks, benefits and other options for treatment, the patient has consented to  Procedure(s): TRANSESOPHAGEAL ECHOCARDIOGRAM (TEE) (N/A) as a surgical intervention .  The patient's history has been reviewed, patient examined, no change in status, stable for surgery.  I have reviewed the patient's chart and labs.  Questions were answered to the patient's satisfaction.     [employer]

## 2018-01-20 NOTE — Progress Notes (Signed)
PROGRESS NOTE  Ricky [name]. PJK:[id number] DOB: 1940/09/29 DOA: 01/16/2018 PCP: Deland Pretty, MD  Brief Narrative: 77 year old man PMH including diabetes mellitus type 2, depression, schizophrenia, dementia presented after a fall and increasing confusion.  Noted to have fever, tachycardia and hypotension as well as hypoxia.  Admitted for sepsis unknown etiology.  Subsequently found to have MRSA bacteremia and left lower extremity cellulitis.  Clinically much improved.  Plan for TEE 12/31.  Infectious disease following.  Assessment/Plan Sepsis secondary to MRSA bacteremia, secondary to left lower extremity cellulitis with leukocytosis, hypotension and fever on admission.  Chest x-ray negative, urinalysis negative.  Stage II pressure ulcer of the buttocks and diabetic ulcers of both first toes not felt to be grossly infected on admission.  Recently had debridement of wounds on first toes and podiatry office 12/20. --Continue vancomycin, follow-up repeat blood cultures.   Sepsis has resolved, hemodynamics are stable.  --TTE inconclusive.  TEE performed as below --d/w Dr. Christene Lye needs to complete 14 days of IV Vancomycin for bacteremia-given he is not very oriented will need to keep in Hospital and discuss with HCPOA  Diabetes mellitus type 2 --CBG stable.  Continue Levemir and sliding scale insulin. Sugars are controlled in 80-140 range Resume oral agents on discharge.  Strep B bacteriuria.  Not clearly symptomatic but covered by vancomycin.  Acute hypoxic respiratory failure --Resolved.  Etiology unclear.  Hypertension --Remains stable.  Stage III pressure ulcer bilateral buttocks with diabetic ulcers bilateral first toes --Continue wound care --pressure redistribution chair pad is ordered for his use when OOB in the chair and he is to take this with him upon discharge to facilitate resolution of the buttock lesions and to reduce the incidence of chronic shearing of the  buttock tissues.  Dementia, schizophrenia --Remains stable.  Continue olanzapine, fluvoxamine, Xanax --cannot make clear decisions but wishing to go home --see above   Continues to improve need PT to see again--will   DVT prophylaxis: heparin Code Status: DNR Family Communication: none present currently Disposition Plan: Likely return to assisted living facility in [place], [place] [facility] 3:10 PM  01/20/2018, 3:09 PM  LOS: 4 days   Consultants:    Procedures:  Echo Study Conclusions  - Echo windows are inadequate to evaluate for endocarditis. TEE is   recommended if clinically indicated .  TEE 12/31-no vegetation    Antimicrobials:  Vancomycin  Interval history/Subjective:  Awake and some confused-inisitent on d/c home No fevers no chills Hungry wants to shower  Objective: Vitals:  Vitals:   01/20/18 1240 01/20/18 1440  BP: (!) 123/57 125/60  Pulse: (!) 51 62  Resp: 10 18  Temp:  98.2 F (36.8 C)  SpO2: 94% 95%    Exam: o eomi oriente din nad-no ict no pallor o cta b o s1 s 2no m/r/g o abd soft nt nd no rebound no guard o Power gorssly intect-moving all limbs o   I have personally reviewed the following:   Data: . CBG stable . Bmet stable  Scheduled Meds: . ALPRAZolam  0.5 mg Oral QHS  . Chlorhexidine Gluconate Cloth  6 each Topical Q0600  . docusate sodium  100 mg Oral BID  . fluvoxaMINE  100 mg Oral QHS  . heparin  5,000 Units Subcutaneous Q8H  . insulin aspart  0-5 Units Subcutaneous QHS  . insulin aspart  0-9 Units Subcutaneous TID WC  . insulin detemir  30 Units Subcutaneous QHS  . mupirocin ointment  1  application Nasal BID  . OLANZapine  25 mg Oral QHS  . pantoprazole  40 mg Oral Daily  . pravastatin  40 mg Oral QPC lunch  . sodium chloride flush  3 mL Intravenous Q12H   Continuous Infusions: . vancomycin 1,250 mg (01/19/18 0957)  . vancomycin      Principal Problem:   MRSA  bacteremia Active Problems:   Diabetes mellitus without complication (HCC)   Hypertension   Depression   Hyperlipidemia   Schizophrenia (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

## 2018-01-20 NOTE — CV Procedure (Signed)
   Transesophageal Echocardiogram  Indications: Bacteremia  Time out performed  During this procedure the patient is administered a total of Versed and Fentanyl (see nursing note) to achieve and maintain moderate conscious sedation.  The patient's heart rate, blood pressure, and oxygen saturation are monitored continuously during the procedure. The period of conscious sedation is 30 minutes, of which I was present face-to-face 100% of this time.  Findings:  Left Ventricle: Normal EF 55%  Mitral Valve: Mild MR  Aortic Valve: Mildly calcified.     Tricuspid Valve: No TR  Left Atrium: Normal  Impression: NO ENDOCARDITIS  Candee Furbish, MD

## 2018-01-20 NOTE — Progress Notes (Signed)
  Echocardiogram Echocardiogram Transesophageal has been performed.  Ricky [name] 01/20/2018, 12:15 PM

## 2018-01-20 NOTE — Progress Notes (Signed)
PT Cancellation Note  Patient Details Name: Ricky [name]. MRN: [id number] DOB: 1940/11/25   Cancelled Treatment:    Reason Eval/Treat Not Completed: Patient at procedure or test/unavailable   Felicidad Sugarman,KATHrine E 01/20/2018, 9:55 AM Carmelia Bake, PT, DPT Acute Rehabilitation Services Office: [phone] Pager: [phone]

## 2018-01-20 NOTE — Progress Notes (Signed)
Per nsg-patient not oriented enough to make decisions about d/c plans-CSW will f/u with patient's POA on d/c plan-patient from [name]-a long term resident.

## 2018-01-20 NOTE — Progress Notes (Addendum)
Update 01/22/18: CSW spoke with [name] and confirmed he is NOT patients POA.   Mr. Kenton Kingfisher directed CSW to contact [place] attempted to contact Ray at [phone] however an individual answered the phone and stated " I dont have time to talk" and hung up on CSW.  CSW attempted to contact patients emergency contacts to determine discharge plan due to patient only being A&O x 2. Both contacts on patients facesheetOvid Curd and Ray) did not answer and CSW left voicemail for them to return call. Patient is a long-term resident at [facility] however PT is recommending SNF. CSW attempted to confirm if discharge plan is back to long-term bed at [name] OR if patient would like to pursue SNF (also at [facility]) but insurance authorization would need to be started.   CSW spoke with Janie from [facility] and was given the following contact information:   [name] [facility]): [phone] This number is to [employer]- which Chrissie Noa works at. Was provided the email [email or web address] .Pecola Leisure (niece): [phone] Left voicemail to return call.   CSW will continue to follow up with family members.   Kingsley Spittle, [place]  [phone]

## 2018-01-21 ENCOUNTER — Inpatient Hospital Stay: Payer: Self-pay

## 2018-01-21 ENCOUNTER — Encounter (HOSPITAL_COMMUNITY): Payer: Self-pay | Admitting: Cardiology

## 2018-01-21 LAB — GLUCOSE, CAPILLARY
Glucose-Capillary: 119 mg/dL — ABNORMAL HIGH (ref 70–99)
Glucose-Capillary: 222 mg/dL — ABNORMAL HIGH (ref 70–99)
Glucose-Capillary: 187 mg/dL — ABNORMAL HIGH (ref 70–99)
Glucose-Capillary: 191 mg/dL — ABNORMAL HIGH (ref 70–99)

## 2018-01-21 LAB — VANCOMYCIN, TROUGH: Vancomycin Tr: 9 ug/mL — ABNORMAL LOW (ref 15–20)

## 2018-01-21 LAB — HCV INTERPRETATION

## 2018-01-21 LAB — HCV AB W REFLEX TO QUANT PCR: HCV Ab: 0.1 s/co ratio (ref 0.0–0.9)

## 2018-01-21 MED ORDER — VANCOMYCIN HCL IN DEXTROSE 750-5 MG/150ML-% IV SOLN
750.0000 mg | Freq: Two times a day (BID) | INTRAVENOUS | Status: DC
  Administered 2018-01-21 – 2018-01-23 (×4): 750 mg via INTRAVENOUS
  Filled 2018-01-21 (×6): qty 150

## 2018-01-21 MED ORDER — SODIUM CHLORIDE 0.9% FLUSH: 10.0000 mL | INTRAVENOUS | Status: DC | PRN

## 2018-01-21 MED ORDER — SODIUM CHLORIDE 0.9% FLUSH
10.0000 mL | Freq: Two times a day (BID) | INTRAVENOUS | Status: DC
  Administered 2018-01-21 – 2018-01-23 (×3): 10 mL

## 2018-01-21 NOTE — Progress Notes (Signed)
Pharmacy Antibiotic Note  Ricky [name]. is a 78 y.o. male admitted on 01/16/2018 with sepsis.  Pharmacy has been consulted for vancomycin dosing. ID following  Significant Events: -TEE negative -PICC placed 1/1  Today, 01/21/18  WBC (12/29) WNL  SCr 1, CrCl ~ 70 mL/min  Afebrile  PICC line placed today. Pt to discharge on vancomycin IV for 2 weeks, Day #1 is 12/29. Confirmed with ID.  VT = 9 mcg/mL today is subtherapeutic. Goal VT 15-20 mcg/mL. Targeting VT instead of AUC since VT will be monitored outpatient.  Plan:  Increase vancomycin dose to 750 mg IV q12h  Check SCr tomorrow, follow renal function and adjust dose if necessary  Height: 5' 9.5" (176.5 cm) Weight: 218 lb 7.6 oz (99.1 kg) IBW/kg (Calculated) : 71.85  Temp (24hrs), Avg:98.6 F (37 C), Min:98.4 F (36.9 C), Max:98.7 F (37.1 C)  Recent Labs  Lab 01/16/18 1904 01/16/18 1911 01/16/18 2118 01/18/18 0602 01/20/18 0629 01/21/18 1425  WBC 22.9*  --   --  9.8  --   --   CREATININE 1.33*  --   --  1.11 0.98  --   LATICACIDVEN  --  3.45* 2.09*  --   --   --   VANCOTROUGH  --   --   --   --   --  9*    Estimated Creatinine Clearance: 72.8 mL/min (by C-G formula based on SCr of 0.98 mg/dL).    Allergies  Allergen Reactions  . Asa [Aspirin]     "Dr told me not to take it"  . Codeine Other (See Comments)    DIZZINESS with Tylenol 3  . Tylenol [Acetaminophen] Other (See Comments)    Dizziness with tylenol 3    Antimicrobials this admission: 12/27 cefepime >> 12/29 12/27 vancomycin >>   Dose adjustments this admission: 1/1 vancomycin 1250 mg IV q24h --> 750 mg IV q12h  Microbiology results: 12/27 BCx: MRSA 12/19 repeat BCx: ngtd 12/27 UCx: group B strep  12/28 MRSA PCR: Positive Thank you for allowing pharmacy to be a part of this patient's care.  Lenis Noon, PharmD Clinical Pharmacist 01/21/2018 3:14 PM

## 2018-01-21 NOTE — Progress Notes (Addendum)
LCSW received call from floor RN stating patient was ready to dc.   Patient is LTC at [facility]. PT currently recommending SNF.   LCSW contacted facility admissions. Patient cannot return to facility today. Patient will return to facility with rehab and will require pre auth. Facility will work on [employer].   LCSW notified floor RN.   LCSW will continue to follow for patients return.    Carolin Coy [place]

## 2018-01-21 NOTE — Progress Notes (Signed)
PHARMACY CONSULT NOTE FOR:  OUTPATIENT  PARENTERAL ANTIBIOTIC THERAPY (OPAT)  Indication: MRSA bacteremia Regimen: Vancomycin 750 mg IV q12h End date: 01/31/18  IV antibiotic discharge orders are pended. To discharging provider:  please sign these orders via discharge navigator,  Select New Orders & click on the button choice - Manage This Unsigned Work.    Thank you for allowing pharmacy to be a part of this patient's care.  Lenis Noon, PharmD 01/21/2018, 3:25 PM

## 2018-01-21 NOTE — Progress Notes (Signed)
PROGRESS NOTE  Ricky [name]. WUJ:[id number] DOB: 13-Oct-1940 DOA: 01/16/2018 PCP: Deland Pretty, MD  Brief Narrative: 78 year old man PMH including diabetes mellitus type 2, depression, schizophrenia, dementia presented after a fall and increasing confusion.  Noted to have fever, tachycardia and hypotension as well as hypoxia.  Admitted for sepsis unknown etiology.  Subsequently found to have MRSA bacteremia and left lower extremity cellulitis.  Clinically much improved.  Plan for TEE 12/31.  Infectious disease following.  Assessment/Plan Sepsis secondary to MRSA bacteremia, secondary to left lower extremity cellulitis with leukocytosis, hypotension and fever on admission.  Chest x-ray negative, urinalysis negative.  Stage II pressure ulcer of the buttocks and diabetic ulcers of both first toes not felt to be grossly infected on admission.  Recently had debridement of wounds on first toes and podiatry office 12/20. --repeat blood cultures neg Sepsis has resolved, hemodynamics are stable.  --TTE inconclusive.  TEE performed as below --d/w Dr. Christene Lye needs to complete 14 days of IV Vancomycin for bacteremia-PICC placed and nearing d/c  Diabetes mellitus type 2 --CBG stable.  Continue Levemir and sliding scale insulin. Sugars are controlled in 120-220 range  Strep B bacteriuria.  Not clearly symptomatic but covered by vancomycin.  Acute hypoxic respiratory failure --Resolved.  Etiology unclear.  Hypertension --Remains stable.  Stage III pressure ulcer bilateral buttocks with diabetic ulcers bilateral first toes --Continue wound care --pressure redistribution chair pad is ordered for his use when OOB in the chair and he is to take this with him upon discharge to facilitate resolution of the buttock lesions and to reduce the incidence of chronic shearing of the buttock tissues.  Dementia, schizophrenia --Remains stable.  Continue olanzapine, fluvoxamine, Xanax --cannot make  clear decisions but wishing to go home --see above   Continues to improve-await placemnt to skilled facility in the next 24-48 hr  DVT prophylaxis: heparin Code Status: DNR Family Communication: none present currently Disposition Plan: d/c to SNF am    Verneita Griffes, MD [facility] 4:25 PM  01/21/2018, 4:25 PM  LOS: 5 days   Consultants:    Procedures:  Echo Study Conclusions  - Echo windows are inadequate to evaluate for endocarditis. TEE is   recommended if clinically indicated .  TEE 12/31-no vegetation    Antimicrobials:  Vancomycin  Interval history/Subjective:  Awake and some confused-inisitent on d/c home Asking to shower No fever no chills moments of being coherent  Objective: Vitals:  Vitals:   01/21/18 0507 01/21/18 1624  BP: (!) 106/53 (!) 142/76  Pulse: 77 71  Resp: 14 18  Temp: 98.4 F (36.9 C) 98.3 F (36.8 C)  SpO2: 94% 95%    Exam: o eomi oriented in nad-no ict no pallor o cta b o s1 s 2no m/r/g o abd soft nt nd no rebound no guard o No deficit o Neuro intact o talktative and at times not making sense o    I have personally reviewed the following:   Data: . CBG stable . Bmet stable  Scheduled Meds: . ALPRAZolam  0.5 mg Oral QHS  . docusate sodium  100 mg Oral BID  . fluvoxaMINE  100 mg Oral QHS  . heparin  5,000 Units Subcutaneous Q8H  . insulin aspart  0-5 Units Subcutaneous QHS  . insulin aspart  0-9 Units Subcutaneous TID WC  . insulin detemir  30 Units Subcutaneous QHS  . mupirocin ointment  1 application Nasal BID  . OLANZapine  25 mg Oral QHS  . pantoprazole  40 mg Oral Daily  . pravastatin  40 mg Oral QPC lunch  . sodium chloride flush  10-40 mL Intracatheter Q12H  . sodium chloride flush  3 mL Intravenous Q12H   Continuous Infusions: . vancomycin      Principal Problem:   MRSA bacteremia Active Problems:   Diabetes mellitus without complication (HCC)   Hypertension   Depression    Hyperlipidemia   Schizophrenia ([place])   Sepsis ([place])   Left leg cellulitis   Stage III pressure ulcer of left buttock (HCC)   LOS: 5 days

## 2018-01-21 NOTE — Progress Notes (Addendum)
Peripherally Inserted Central Catheter/Midline Placement  The IV Nurse has discussed with the patient and/or persons authorized to consent for the patient, the purpose of this procedure and the potential benefits and risks involved with this procedure.  The benefits include less needle sticks, lab draws from the catheter, and the patient may be discharged home with the catheter. Risks include, but not limited to, infection, bleeding, blood clot (thrombus formation), and puncture of an artery; nerve damage and irregular heartbeat and possibility to perform a PICC exchange if needed/ordered by physician.  Alternatives to this procedure were also discussed.  Bard Power PICC patient education guide, fact sheet on infection prevention and patient information card has been provided to patient /or left at bedside.  Telephone consent obtained from [facility]. Explained to pt at bedside, pt agreeable and aware of [facility] consent. PICC/Midline Placement Documentation  PICC Single Lumen 01/21/18 PICC Right Brachial 42 cm 0 cm (Active)  Indication for Insertion or Continuance of Line Home intravenous therapies (PICC only) 01/21/2018 11:53 AM  Exposed Catheter (cm) 0 cm 01/21/2018 11:53 AM  Site Assessment Clean;Dry;Intact 01/21/2018 11:53 AM  Line Status Flushed;Saline locked;Blood return noted 01/21/2018 11:53 AM  Dressing Type Transparent 01/21/2018 11:53 AM  Dressing Status Clean;Dry;Intact;Antimicrobial disc in place 01/21/2018 11:53 AM  Line Care Connections checked and tightened 01/21/2018 11:53 AM  Line Adjustment (NICU/IV Team Only) No 01/21/2018 11:53 AM  Dressing Intervention New dressing 01/21/2018 11:53 AM  Dressing Change Due 01/28/18 01/21/2018 11:53 AM       Rolena Infante 01/21/2018, 11:53 AM

## 2018-01-21 NOTE — Progress Notes (Signed)
Spoke with [place] [facility] re PICC line, consent obtained.  Ricky [name] can be contacted at the number on the chart.  Spoke with Belenda Cruise RN re d/c plans.  Paged Dr Verlon Au re d/c plans, pt active use of w/c with PICC in place and length of ABT due to notes placed by Dr Johnnye Sima. Dr Verlon Au states to proceed with PICC as ordered.

## 2018-01-22 LAB — COMPREHENSIVE METABOLIC PANEL
ALT: 38 U/L (ref 0–44)
AST: 35 U/L (ref 15–41)
Albumin: 3.3 g/dL — ABNORMAL LOW (ref 3.5–5.0)
Alkaline Phosphatase: 71 U/L (ref 38–126)
Anion gap: 11 (ref 5–15)
BUN: 15 mg/dL (ref 8–23)
CO2: 28 mmol/L (ref 22–32)
Calcium: 9.2 mg/dL (ref 8.9–10.3)
Chloride: 100 mmol/L (ref 98–111)
Creatinine, Ser: 0.96 mg/dL (ref 0.61–1.24)
GFR calc Af Amer: >60 mL/min (ref >60)
Glucose, Bld: 231 mg/dL — ABNORMAL HIGH (ref 70–99)
Potassium: 3.6 mmol/L (ref 3.5–5.1)
Sodium: 139 mmol/L (ref 135–145)
Total Bilirubin: 0.6 mg/dL (ref 0.3–1.2)
Total Protein: 7 g/dL (ref 6.5–8.1)

## 2018-01-22 LAB — CBC WITH DIFFERENTIAL/PLATELET
Abs Immature Granulocytes: 0.03 10*3/uL (ref 0.00–0.07)
Basophils Absolute: 0.1 10*3/uL (ref 0.0–0.1)
Basophils Relative: 1 %
EOS PCT: 5 %
Eosinophils Absolute: 0.3 10*3/uL (ref 0.0–0.5)
HCT: 41.7 % (ref 39.0–52.0)
Hemoglobin: 12.9 g/dL — ABNORMAL LOW (ref 13.0–17.0)
Immature Granulocytes: 0 %
Lymphocytes Relative: 27 %
Lymphs Abs: 1.8 10*3/uL (ref 0.7–4.0)
MCH: 28.8 pg (ref 26.0–34.0)
MCHC: 30.9 g/dL (ref 30.0–36.0)
MCV: 93.1 fL (ref 80.0–100.0)
Monocytes Absolute: 0.6 10*3/uL (ref 0.1–1.0)
Monocytes Relative: 8 %
Neutro Abs: 4 10*3/uL (ref 1.7–7.7)
Neutrophils Relative %: 59 %
Platelets: 211 10*3/uL (ref 150–400)
RBC: 4.48 MIL/uL (ref 4.22–5.81)
RDW: 14 % (ref 11.5–15.5)
WBC: 6.8 10*3/uL (ref 4.0–10.5)
nRBC: 0 % (ref 0.0–0.2)

## 2018-01-22 LAB — GLUCOSE, CAPILLARY
Glucose-Capillary: 160 mg/dL — ABNORMAL HIGH (ref 70–99)
Glucose-Capillary: 222 mg/dL — ABNORMAL HIGH (ref 70–99)
Glucose-Capillary: 168 mg/dL — ABNORMAL HIGH (ref 70–99)
Glucose-Capillary: 148 mg/dL — ABNORMAL HIGH (ref 70–99)

## 2018-01-22 MED ORDER — ALPRAZOLAM 0.5 MG PO TABS: 0.5000 mg | ORAL_TABLET | Freq: Every day | ORAL | 0 refills | Status: DC

## 2018-01-22 MED ORDER — ZOLPIDEM TARTRATE 5 MG PO TABS: 5.0000 mg | ORAL_TABLET | Freq: Every day | ORAL | 0 refills | Status: AC

## 2018-01-22 MED ORDER — HYDROXYZINE HCL 25 MG PO TABS: 25.0000 mg | ORAL_TABLET | Freq: Every evening | ORAL | 0 refills | Status: DC | PRN

## 2018-01-22 MED ORDER — EMPAGLIFLOZIN 10 MG PO TABS: 10.0000 mg | ORAL_TABLET | Freq: Every day | ORAL | 0 refills | Status: DC

## 2018-01-22 MED ORDER — VANCOMYCIN IV (FOR PTA / DISCHARGE USE ONLY): 750.0000 mg | Freq: Two times a day (BID) | INTRAVENOUS | 0 refills | Status: AC

## 2018-01-22 MED ORDER — FLUVOXAMINE MALEATE 100 MG PO TABS: 100.0000 mg | ORAL_TABLET | Freq: Every day | ORAL | 0 refills | Status: AC

## 2018-01-22 MED ORDER — VANCOMYCIN HCL IN DEXTROSE 750-5 MG/150ML-% IV SOLN: 750.0000 mg | Freq: Two times a day (BID) | INTRAVENOUS | 0 refills | Status: DC

## 2018-01-22 MED ORDER — INSULIN DETEMIR 100 UNIT/ML ~~LOC~~ SOLN: 30.0000 [IU] | Freq: Every day | SUBCUTANEOUS | 11 refills | Status: DC

## 2018-01-22 MED ORDER — OLANZAPINE 5 MG PO TABS: 25.0000 mg | ORAL_TABLET | Freq: Every day | ORAL | 0 refills | Status: AC

## 2018-01-22 NOTE — Care Management Important Message (Signed)
Important Message  Patient Details  Name: Ricky [name]. MRN: [id number] Date of Birth: 10-22-1940   Medicare Important Message Given:  Yes    Kerin Salen 01/22/2018, 10:11 AMImportant Message  Patient Details  Name: Ricky [name]. MRN: [id number] Date of Birth: [birth year]   Medicare Important Message Given:  Yes    Kerin Salen 01/22/2018, 10:11 AM

## 2018-01-22 NOTE — Discharge Summary (Signed)
Physician Discharge Summary  Ricky [name]. JJH:[id number] DOB: [birth year] DOA: 01/16/2018  PCP: Deland Pretty, MD  Admit date: 01/16/2018 Discharge date: 01/22/2018  Time spent: 45 minutes  Recommendations for Outpatient Follow-up:  1. Please ensure that patient is turned in the bed every 2 hours and wound care at the facility needs to follow-up and dress the area on the sacrum 3X2 X1 centimeters-pressure redistribution chair pad to use when out of bed in chair and follow-up as needed for right great toe and left great toe wounds-recommend either Prevalon boots or heel protector pads to both feet 2. Complete vancomycin via PICC line on 01/30/2018 and please pull PICC line at the time 3. Recommend follow-up with psychiatry for numerous psychiatric issues he has multiple medications that were refilled as below 4. Will need CBC differential and basic metabolic panel in about 1 week's time as an outpatient 5.  will need outpatient dermatology follow-up for right ear "horn"   Discharge Diagnoses:  Principal Problem:   MRSA bacteremia Active Problems:   Diabetes mellitus without complication ([place])   Hypertension   Depression   Hyperlipidemia   Schizophrenia ([place])   Sepsis ([place])   Left leg cellulitis   Stage III pressure ulcer of left buttock ([place])   Discharge Condition: Improved  Diet recommendation: Heart healthy low-salt  Filed Weights   01/20/18 0957 01/21/18 0504 01/22/18 0541  Weight: 99.6 kg 99.1 kg 97.5 kg    History of present illness:  78 year old man PMH including diabetes mellitus type 2, depression, schizophrenia, dementia presented after a fall and increasing confusion.  Noted to have fever, tachycardia and hypotension as well as hypoxia.  Admitted for sepsis unknown etiology.  Subsequently found to have MRSA bacteremia and left lower extremity cellulitis.  Clinically much improved.  Plan for TEE 12/31.  Infectious disease following.  Hospital Course:  Sepsis  secondary to MRSA bacteremia, secondary to left lower extremity cellulitis with leukocytosis, hypotension and fever on admission.  Chest x-ray negative, urinalysis negative.  Stage II pressure ulcer of the buttocks and diabetic ulcers of both first toes not felt to be grossly infected on admission.  Recently had debridement of wounds on first toes and podiatry office 12/20. --repeat blood cultures neg Sepsis has resolved, hemodynamics are stable.  --TTE inconclusive.  TEE performed as below --d/w Dr. Christene Lye needs to complete 14 days of IV Vancomycin for bacteremia-PICC placed and of duration 01/30/2018  Diabetes mellitus type 2 --In hospital was on Levemir and sliding scale insulin. Sugars relatively well controlled on discharge resume orals and sliding scale coverage note metformin was discontinued this admission and would not resume  Strep B bacteriuria.  Not clearly symptomatic but covered by vancomycin.  Acute hypoxic respiratory failure --Resolved.  Etiology unclear.  Hypertension --Remains stable.  Stage III pressure ulcer bilateral buttocks with diabetic ulcers bilateral first toes --Continue wound care --pressure redistribution chair pad is ordered for his use when OOB in the chair and he is to take this with him upon discharge to facilitate resolution of the buttock lesions and to reduce the incidence of chronic shearing of the buttock tissues. -Needs to be turned every 2 hours at facility  Dementia, schizophrenia --Remains stable.    Restarted home meds prescription given on discharge  Consultations:  Infectious disease  Procedures:  Echo Study Conclusions  - Echo windows are inadequate to evaluate for endocarditis. TEE is recommended if clinically indicated .  TEE 12/31-no vegetation   Discharge Exam: Vitals:   01/21/18  2010 01/22/18 0541  BP: (!) 151/72 (!) 152/92  Pulse: 64 78  Resp: 16 16  Temp: 98.3 F (36.8 C) 98 F (36.7 C)  SpO2:  97% 93%    General: Awake alert pleasant no distress eating drinking Cardiovascular: S1-S2 no murmur rub or gallop Respiratory: Clinically clear no added sound Abdomen soft nontender turning the patient on the bottom there is macerated wound as per description above with wet periwound areas-patient has not been turning in the bed Legs examined and noted wounds as per above description  Discharge Instructions    Allergies as of 01/22/2018      Reactions   Asa [aspirin]    "Dr told me not to take it"   Codeine Other (See Comments)   DIZZINESS with Tylenol 3   Tylenol [acetaminophen] Other (See Comments)   Dizziness with tylenol 3      Medication List    STOP taking these medications   chlorhexidine 0.12 % solution Commonly known as:  PERIDEX   feeding supplement (PRO-STAT SUGAR FREE 64) Liqd   guaiFENesin 600 MG 12 hr tablet Commonly known as:  MUCINEX   magnesium hydroxide 400 MG/5ML suspension Commonly known as:  MILK OF MAGNESIA   meloxicam 15 MG tablet Commonly known as:  MOBIC   metFORMIN 500 MG tablet Commonly known as:  GLUCOPHAGE   omega-3 acid ethyl esters 1 g capsule Commonly known as:  LOVAZA   ONGLYZA 5 MG Tabs tablet Generic drug:  saxagliptin HCl   polyethylene glycol packet Commonly known as:  MIRALAX / GLYCOLAX   PROSTATE Tabs     TAKE these medications   ALPRAZolam 0.5 MG tablet Commonly known as:  XANAX Take 1 tablet (0.5 mg total) by mouth at bedtime.   amLODipine 5 MG tablet Commonly known as:  NORVASC Take 5 mg by mouth daily.   CITRACAL + D PO Take 1 tablet by mouth daily.   docusate sodium 100 MG capsule Commonly known as:  COLACE Take 100 mg by mouth 2 (two) times daily.   empagliflozin 10 MG Tabs tablet Commonly known as:  JARDIANCE Take 10 mg by mouth daily.   fluvoxaMINE 100 MG tablet Commonly known as:  LUVOX Take 1 tablet (100 mg total) by mouth at bedtime.   furosemide 20 MG tablet Commonly known as:   LASIX Take 20 mg by mouth daily before breakfast.   HUMALOG KWIKPEN 100 UNIT/ML KwikPen Generic drug:  insulin lispro Inject 2-11 Units into the skin See admin instructions. Times: 0630 1130 1630  Scale: 101-150: 2 units 151-200: 3 units 201-250: 5 units 251-300: 7 units 301-350: 9 units >350: 11 units   hydrOXYzine 25 MG tablet Commonly known as:  ATARAX/VISTARIL Take 1 tablet (25 mg total) by mouth at bedtime as needed (sleep).   insulin detemir 100 UNIT/ML injection Commonly known as:  LEVEMIR Inject 0.3 mLs (30 Units total) into the skin at bedtime. What changed:  how much to take   MYRBETRIQ 25 MG Tb24 tablet Generic drug:  mirabegron ER Take 25 mg by mouth daily.   OLANZapine 5 MG tablet Commonly known as:  ZYPREXA Take 5 tablets (25 mg total) by mouth daily. What changed:    how to take this  when to take this   omeprazole 40 MG capsule Commonly known as:  PRILOSEC Take 40 mg by mouth daily.   pravastatin 40 MG tablet Commonly known as:  PRAVACHOL Take 40 mg by mouth daily after lunch.   tamsulosin  0.4 MG Caps capsule Commonly known as:  FLOMAX Take 0.4 mg by mouth daily.   THERAGRAN-M PREMIER 50 PLUS PO Take 1 tablet by mouth daily.   vancomycin  IVPB Inject 750 mg into the vein every 12 (twelve) hours for 9 days. Indication:  MRSA bacteremia Last Day of Therapy:  01/31/18 Labs - _0 /02/20 1034         Allergies  Allergen Reactions  . Asa [Aspirin]     "Dr told me not to take it"  . Codeine Other (See Comments)    DIZZINESS with Tylenol 3  . Tylenol [Acetaminophen] Other (See Comments)    Dizziness with tylenol 3      The results of significant diagnostics from this hospitalization (including imaging, microbiology, ancillary and laboratory) are listed below for reference.    Significant Diagnostic Studies: Dg Chest 2 View  Result Date: 01/16/2018 CLINICAL DATA:  Fever with low O2 saturation EXAM: CHEST - 2 VIEW COMPARISON:  May 08, 2017 FINDINGS: The heart size and mediastinal contours are stable. Mild central pulmonary vessel prominence is noted. There is minimal left pleural effusion. There is no focal pneumonia. Minimal posterior pleural effusions are noted. The visualized skeletal structures are stable. IMPRESSION: No focal pneumonia. Mild central pulmonary vascular  congestion. Minimal posterior pleural effusions. Electronically Signed   By: Abelardo Diesel M.D.   On: 01/16/2018 19:50   Ct Head Wo Contrast  Result Date: 01/16/2018 CLINICAL DATA:  Patient fell in bathroom and nursing facility. Fever. EXAM: CT HEAD WITHOUT CONTRAST CT CERVICAL SPINE WITHOUT CONTRAST TECHNIQUE: Multidetector CT imaging of the head and cervical spine was performed following the standard protocol without intravenous contrast. Multiplanar CT image reconstructions of the cervical spine were also generated. COMPARISON:  And CT 12/10/2012 and MRI 05/09/2017 FINDINGS: CT HEAD FINDINGS Brain: Stable  age related involutional changes of brain with chronic small vessel ischemic disease. Negative for acute large vascular territory infarct, hemorrhage, intra-axial mass nor extra-axial fluid collections. No edema or midline shift. Vascular: No hyperdense vessel sign. Atherosclerosis of the skull base. Skull: Intact without skull fracture. No suspicious osseous lesions. Sinuses/Orbits: Bilateral cataract extractions. Intact orbits and globes. Minimal ethmoid sinus mucosal thickening. Other: Clear mastoids. CT CERVICAL SPINE FINDINGS Alignment: Maintained cervical lordosis. Skull base and vertebrae: Motion related artifacts across the C6-7 interspace limit assessment. Intact skull base. Intact atlantodental interval and craniocervical relationship. No acute cervical spine fracture. Soft tissues and spinal canal: No prevertebral fluid or swelling. No visible canal hematoma. Disc levels: There is mild disc space narrowing from C2 through C6 and moderate at C6-7 through T1-T2. Minimal anterolisthesis grade 1 of C5 and C6 is identified. There is uncovertebral joint osteoarthritis and spurring greatest at C6-7 bilaterally. Bilateral multilevel degenerative facet arthropathy is seen. Upper chest: Clear lung apices. Other: Moderate extracranial carotid arteriosclerosis. IMPRESSION: 1. Chronic small vessel ischemic  disease of periventricular white matter. No acute intracranial abnormality. 2. Cervical spondylosis without acute cervical spine fracture. Electronically Signed   By: Ashley Royalty M.D.   On: 01/16/2018 19:55   Ct Cervical Spine Wo Contrast  Result Date: 01/16/2018 CLINICAL DATA:  Patient fell in bathroom and nursing facility. Fever. EXAM: CT HEAD WITHOUT CONTRAST CT CERVICAL SPINE WITHOUT CONTRAST TECHNIQUE: Multidetector CT imaging of the head and cervical spine was performed following the standard protocol without intravenous contrast. Multiplanar CT image reconstructions of the cervical spine were also generated. COMPARISON:  And CT 12/10/2012 and MRI 05/09/2017 FINDINGS: CT HEAD FINDINGS Brain: Stable age related involutional changes of brain with chronic small vessel ischemic disease. Negative for acute large vascular territory infarct, hemorrhage, intra-axial mass nor extra-axial fluid collections. No edema or midline shift. Vascular: No hyperdense vessel sign. Atherosclerosis of the skull base. Skull: Intact without skull fracture. No suspicious osseous lesions. Sinuses/Orbits: Bilateral cataract extractions. Intact orbits and globes. Minimal ethmoid sinus mucosal thickening. Other: Clear mastoids. CT CERVICAL SPINE FINDINGS Alignment: Maintained cervical lordosis. Skull base and vertebrae: Motion related artifacts across the C6-7 interspace limit assessment. Intact skull base. Intact atlantodental interval and craniocervical relationship. No acute cervical spine fracture. Soft tissues and spinal canal: No prevertebral fluid or swelling. No visible canal hematoma. Disc levels: There is mild disc space narrowing from C2 through C6 and moderate at C6-7 through T1-T2. Minimal anterolisthesis grade 1 of C5 and C6 is identified. There is uncovertebral joint osteoarthritis and spurring greatest at C6-7 bilaterally. Bilateral multilevel degenerative facet arthropathy is seen. Upper chest: Clear lung apices.  Other: Moderate extracranial carotid arteriosclerosis. IMPRESSION: 1. Chronic small vessel ischemic disease of periventricular white matter. No acute intracranial abnormality. 2. Cervical spondylosis without acute cervical spine fracture. Electronically Signed   By: Ashley Royalty M.D.   On: 01/16/2018 19:55   Dg Shoulder Left  Result Date: 01/16/2018 CLINICAL DATA:  Status post fall today with left shoulder pain. EXAM: LEFT SHOULDER - 2+ VIEW COMPARISON:  July 29, 2010 FINDINGS: There is osteophyte formation and high-riding left humerus. There is no acute fracture or dislocation. IMPRESSION: No acute fracture or dislocation. Electronically Signed   By: Abelardo Diesel M.D.   On: 01/16/2018 19:52   [place] Ekg Site Rite  Result Date: 01/21/2018 If Site Rite image not attached, placement could not be confirmed due to current cardiac rhythm.   Microbiology: Recent Results (from the past 240 hour(s))  Culture, blood (  Routine x 2)     Status: Abnormal   Collection Time: 01/16/18  7:05 PM  Result Value Ref Range Status   Specimen Description   Final    BLOOD LEFT FOREARM Performed at [facility] [address], [place], [place] [address]    Special Requests   Final    BOTTLES DRAWN AEROBIC AND ANAEROBIC Blood Culture adequate volume Performed at [facility] [address], [place], [place] [address]    Culture  Setup Time   Final    GRAM POSITIVE COCCI IN BOTH AEROBIC AND ANAEROBIC BOTTLES CRITICAL RESULT CALLED TO, READ BACK BY AND VERIFIED WITH:  [place], T 1755 [id number] FCP  Performed at [facility], [place] [address] [place], [place] [address]    Culture METHICILLIN RESISTANT STAPHYLOCOCCUS AUREUS (A)  Final   Report Status 01/19/2018 FINAL  Final   Organism ID, Bacteria METHICILLIN RESISTANT STAPHYLOCOCCUS AUREUS  Final      Susceptibility   Methicillin resistant staphylococcus aureus - MIC*    CIPROFLOXACIN >=8 RESISTANT Resistant      ERYTHROMYCIN >=8 RESISTANT Resistant     GENTAMICIN <=0.5 SENSITIVE Sensitive     OXACILLIN >=4 RESISTANT Resistant     TETRACYCLINE <=1 SENSITIVE Sensitive     VANCOMYCIN <=0.5 SENSITIVE Sensitive     TRIMETH/SULFA <=10 SENSITIVE Sensitive     CLINDAMYCIN <=0.25 SENSITIVE Sensitive     RIFAMPIN <=0.5 SENSITIVE Sensitive     Inducible Clindamycin NEGATIVE Sensitive     * METHICILLIN RESISTANT STAPHYLOCOCCUS AUREUS  Blood Culture ID Panel (Reflexed)     Status: Abnormal   Collection Time: 01/16/18  7:05 PM  Result Value Ref Range Status   Enterococcus species NOT DETECTED NOT DETECTED Final   Listeria monocytogenes NOT DETECTED NOT DETECTED Final   Staphylococcus species DETECTED (A) NOT DETECTED Final    Comment: CRITICAL RESULT CALLED TO, READ BACK BY AND VERIFIED WITH:  PHARMD PICKERING, T [id number] FCP    Staphylococcus aureus (BCID) DETECTED (A) NOT DETECTED Final    Comment: Methicillin (oxacillin)-resistant Staphylococcus aureus (MRSA). MRSA is predictably resistant to beta-lactam antibiotics (except ceftaroline). Preferred therapy is vancomycin unless clinically contraindicated. Patient requires contact precautions if  hospitalized. CRITICAL RESULT CALLED TO, READ BACK BY AND VERIFIED WITH:  PHARMD PICKERING, T 1755 [id number] FCP    Methicillin resistance DETECTED (A) NOT DETECTED Final    Comment: CRITICAL RESULT CALLED TO, READ BACK BY AND VERIFIED WITH:  PHARMD PICKERING, T 1755 [id number] FCP    Streptococcus species NOT DETECTED NOT DETECTED Final   Streptococcus agalactiae NOT DETECTED NOT DETECTED Final   Streptococcus pneumoniae NOT DETECTED NOT DETECTED Final   Streptococcus pyogenes NOT DETECTED NOT DETECTED Final   Acinetobacter baumannii NOT DETECTED NOT DETECTED Final   Enterobacteriaceae species NOT DETECTED NOT DETECTED Final   Enterobacter cloacae complex NOT DETECTED NOT DETECTED Final   Escherichia coli NOT DETECTED NOT DETECTED Final   Klebsiella oxytoca NOT  DETECTED NOT DETECTED Final   Klebsiella pneumoniae NOT DETECTED NOT DETECTED Final   Proteus species NOT DETECTED NOT DETECTED Final   Serratia marcescens NOT DETECTED NOT DETECTED Final   Haemophilus influenzae NOT DETECTED NOT DETECTED Final   Neisseria meningitidis NOT DETECTED NOT DETECTED Final   Pseudomonas aeruginosa NOT DETECTED NOT DETECTED Final   Candida albicans NOT DETECTED NOT DETECTED Final   Candida glabrata NOT DETECTED NOT DETECTED Final   Candida krusei NOT DETECTED NOT DETECTED Final  Candida parapsilosis NOT DETECTED NOT DETECTED Final   Candida tropicalis NOT DETECTED NOT DETECTED Final    Comment: Performed at [facility], [place] [address], [place], [place] [address]  Culture, blood (Routine x 2)     Status: Abnormal   Collection Time: 01/16/18  7:06 PM  Result Value Ref Range Status   Specimen Description   Final    BLOOD LEFT ARM UPPER Performed at [facility] [address] [place], [place] [address]    Special Requests   Final    BOTTLES DRAWN AEROBIC AND ANAEROBIC Blood Culture results may not be optimal due to an inadequate volume of blood received in culture bottles Performed at [facility] [address], [place], [place] [address]    Culture  Setup Time   Final    GRAM POSITIVE COCCI IN BOTH AEROBIC AND ANAEROBIC BOTTLES CRITICAL VALUE NOTED.  VALUE IS CONSISTENT WITH PREVIOUSLY REPORTED AND CALLED VALUE.    Culture (A)  Final    STAPHYLOCOCCUS AUREUS SUSCEPTIBILITIES PERFORMED ON PREVIOUS CULTURE WITHIN THE LAST 5 DAYS. STAPHYLOCOCCUS SPECIES (COAGULASE NEGATIVE) THE SIGNIFICANCE OF ISOLATING THIS ORGANISM FROM A SINGLE SET OF BLOOD CULTURES WHEN MULTIPLE SETS ARE DRAWN IS UNCERTAIN. PLEASE NOTIFY THE MICROBIOLOGY DEPARTMENT WITHIN ONE WEEK IF SPECIATION AND SENSITIVITIES ARE REQUIRED. Performed at [facility], [place] [address] [place], [place] [address]    Report Status 01/20/2018 FINAL   Final  Urine culture     Status: Abnormal   Collection Time: 01/16/18  9:43 PM  Result Value Ref Range Status   Specimen Description URINE, CLEAN CATCH  Final   Special Requests   Final    NONE Performed at [facility] [address] [place], [place] [address]    Culture (A)  Final    >=100,000 COLONIES/mL GROUP B STREP(S.AGALACTIAE)ISOLATED TESTING AGAINST S. AGALACTIAE NOT ROUTINELY PERFORMED DUE TO PREDICTABILITY OF AMP/PEN/VAN SUSCEPTIBILITY.    Report Status 01/18/2018 FINAL  Final  MRSA PCR Screening     Status: Abnormal   Collection Time: 01/17/18 12:56 AM  Result Value Ref Range Status   MRSA by PCR POSITIVE (A) NEGATIVE Final    Comment:        The GeneXpert MRSA Assay (FDA approved for NASAL specimens only), is one component of a comprehensive MRSA colonization surveillance program. It is not intended to diagnose MRSA infection nor to guide or monitor treatment for MRSA infections. RESULT CALLED TO, READ BACK BY AND VERIFIED WITH: Idelia Salm RN _0  ON 01/17/18 Seward Speck Performed at [facility], [place] [address] [place], [place] [address]   Culture, blood (Routine X 2) w Reflex to ID Panel     Status: None (Preliminary result)   Collection Time: 01/18/18 12:44 PM  Result Value Ref Range Status   Specimen Description   Final    BLOOD RIGHT HAND Performed at [facility] [address] [place], [place] [address]    Special Requests   Final    BOTTLES DRAWN AEROBIC ONLY Blood Culture results may not be optimal due to an inadequate volume of blood received in culture bottles Performed at [facility] [address] [place], [place] [address]    Culture   Final    NO GROWTH 3 DAYS Performed at [facility], [place] [address], [place],  [address]    Report Status PENDING  Incomplete  Culture, blood (Routine X 2) w  Reflex to ID Panel     Status: None (Preliminary result)    Collection Time: 01/18/18 12:54 PM  Result Value Ref Range Status   Specimen Description   Final    BLOOD LEFT HAND Performed at [facility] [address], [place], [place] [address]    Special Requests   Final    BOTTLES DRAWN AEROBIC ONLY Blood Culture adequate volume Performed at [facility] [address], [place], [place] [address]    Culture   Final    NO GROWTH 3 DAYS Performed at [facility], [place] [address], [place], [place], [place] [address]    Report Status PENDING  Incomplete     Labs: Basic Metabolic Panel: Recent Labs  Lab 01/16/18 1904 01/18/18 0602 01/20/18 0629 01/22/18 0339  NA 137 140 141 139  K 3.8 4.2 3.8 3.6  CL 97* 106 104 100  CO2 _0 GLUCOSE 167* 179* 114* 231*  BUN _1 CREATININE 1.33* 1.11 0.98 0.96  CALCIUM 9.1 8.9 9.1 9.2   Liver Function Tests: Recent Labs  Lab 01/16/18 1904 01/22/18 0339  AST 31 35  ALT 16 38  ALKPHOS 94 71  BILITOT 1.2 0.6  PROT 8.2* 7.0  ALBUMIN 4.1 3.3*   No results for input(s): LIPASE, AMYLASE in the last 168 hours. No results for input(s): AMMONIA in the last 168 hours. CBC: Recent Labs  Lab 01/16/18 1904 01/18/18 0602 01/22/18 0339  WBC 22.9* 9.8 6.8  NEUTROABS 20.6*  --  4.0  HGB 13.3 12.0* 12.9*  HCT 42.7 40.2 41.7  MCV 91.6 96.2 93.1  PLT 259 162 211   Cardiac Enzymes: No results for input(s): CKTOTAL, CKMB, CKMBINDEX, TROPONINI in the last 168 hours. BNP: BNP (last 3 results) No results for input(s): BNP in the last 8760 hours.  ProBNP (last 3 results) No results for input(s): PROBNP in the last 8760 hours.  CBG: Recent Labs  Lab 01/21/18 0734 01/21/18 1054 01/21/18 1621 01/21/18 2120 01/22/18 0737  GLUCAP 119* 222* 187* 191* 160*       Signed:  Nita Sells MD   [facility] 01/22/2018, 10:28 AM

## 2018-01-22 NOTE — NC FL2 (Signed)
[place] LEVEL OF CARE SCREENING TOOL     IDENTIFICATION  Patient Name: Ricky [name]. Birthdate: [birth year] Sex: male Admission Date (Current Location): 01/16/2018  [facility] and [place] Number:  Herbalist and Address:  [facility],  [place] [address], [place]      Provider Number: [id number]  Attending Physician Name and Address:  Nita Sells, MD  Relative Name and Phone Number:       Current Level of Care: Hospital Recommended Level of Care: [place] Prior Approval Number:    Date Approved/Denied:   PASRR Number:   [id number]   Discharge Plan: SNF    Current Diagnoses: Patient Active Problem List   Diagnosis Date Noted  . Stage III pressure ulcer of left buttock ([place]) 01/19/2018  . Left leg cellulitis 01/18/2018  . MRSA bacteremia 01/17/2018  . MCI (mild cognitive impairment) 10/24/2017  . OCD (obsessive compulsive disorder) 10/24/2017  . Acute respiratory failure ([place]) 05/06/2017  . HCAP (healthcare-associated pneumonia) 05/06/2017  . Sepsis ([place]) 05/06/2017  . Seizure ([place]) 05/06/2017  . CKD (chronic kidney disease), stage II 05/06/2017  . Food impaction of esophagus   . Esophageal stricture   . Hiatal hernia   . Urinary retention 12/10/2012  . Rhabdomyolysis 12/10/2012  . Falls 12/10/2012  . First degree AV block 10/20/2012  . Extremity muscle atrophy 10/20/2012  . Lumbosacral plexopathy 07/20/2012  . Diabetes mellitus without complication ([place])   . Hypertension   . Depression   . Arthritis   . Hyperlipidemia   . Cataract   . Urinary frequency   . Fatty liver   . Schizophrenia ([place])   . Peripheral edema   . Internal hemorrhoids   . Adenomatous colon polyp   . Type II or unspecified type diabetes mellitus without mention of complication, not stated as uncontrolled   . Type II or unspecified type diabetes mellitus with neurological manifestations, not stated as  uncontrolled(250.60) 03/20/2012  . Abnormality of gait 03/20/2012  . Other malaise and fatigue 03/20/2012    Orientation RESPIRATION BLADDER Height & Weight     Self, Time, Situation  Normal Incontinent, External catheter Weight: 214 lb 15.2 oz (97.5 kg) Height:  5' 9.5" (176.5 cm)  BEHAVIORAL SYMPTOMS/MOOD NEUROLOGICAL BOWEL NUTRITION STATUS        Diet(Please see DC summary)  AMBULATORY STATUS COMMUNICATION OF NEEDS Skin   Extensive Assist   Other (Comment)(pressure ulcer- buttocks, Diabetic foot ucler- right toe.)                       Personal Care Assistance Level of Assistance  Bathing, Feeding, Dressing Bathing Assistance: Maximum assistance Feeding assistance: Limited assistance Dressing Assistance: Maximum assistance     Functional Limitations Info             SPECIAL CARE FACTORS FREQUENCY  PT (By licensed PT), OT (By licensed OT)     PT Frequency: 5 OT Frequency: 5            Contractures      Additional Factors Info  Code Status, Allergies Code Status Info: DNR Allergies Info: ASA ASPIRIN, CODEINE, TYLENOL ACETAMINOPHEN            Current Medications (01/22/2018):  This is the current hospital active medication list Current Facility-Administered Medications  Medication Dose Route Frequency Provider Last Rate Last Dose  . ALPRAZolam Duanne Moron) tablet 0.5 mg  0.5 mg Oral QHS Jerline Pain, MD  0.5 mg at 01/21/18 2124  . docusate sodium (COLACE) capsule 100 mg  100 mg Oral BID Jerline Pain, MD   100 mg at 01/22/18 0810  . fluvoxaMINE (LUVOX) tablet 100 mg  100 mg Oral QHS Candee Furbish C, MD   100 mg at 01/21/18 2124  . heparin injection 5,000 Units  5,000 Units Subcutaneous Q8H Jerline Pain, MD   5,000 Units at 01/22/18 7793  . hydrOXYzine (ATARAX/VISTARIL) tablet 25 mg  25 mg Oral QHS PRN Jerline Pain, MD      . insulin aspart (novoLOG) injection 0-5 Units  0-5 Units Subcutaneous QHS Jerline Pain, MD   2 Units at 01/18/18 2209  . insulin  aspart (novoLOG) injection 0-9 Units  0-9 Units Subcutaneous TID WC Jerline Pain, MD   2 Units at 01/22/18 0810  . insulin detemir (LEVEMIR) injection 30 Units  30 Units Subcutaneous QHS Jerline Pain, MD   30 Units at 01/21/18 2125  . OLANZapine (ZYPREXA) tablet 25 mg  25 mg Oral QHS Jerline Pain, MD   25 mg at 01/21/18 2124  . ondansetron (ZOFRAN) tablet 4 mg  4 mg Oral Q6H PRN Jerline Pain, MD       Or  . ondansetron (ZOFRAN) injection 4 mg  4 mg Intravenous Q6H PRN Jerline Pain, MD      . pantoprazole (PROTONIX) EC tablet 40 mg  40 mg Oral Daily Jerline Pain, MD   40 mg at 01/22/18 0810  . pravastatin (PRAVACHOL) tablet 40 mg  40 mg Oral QPC lunch Jerline Pain, MD   40 mg at 01/21/18 1241  . sodium chloride flush (NS) 0.9 % injection 10-40 mL  10-40 mL Intracatheter Q12H Nita Sells, MD   10 mL at 01/22/18 0810  . sodium chloride flush (NS) 0.9 % injection 10-40 mL  10-40 mL Intracatheter PRN Nita Sells, MD      . sodium chloride flush (NS) 0.9 % injection 3 mL  3 mL Intravenous Q12H Jerline Pain, MD   3 mL at 01/22/18 0811  . vancomycin (VANCOCIN) IVPB 750 mg/150 ml premix  750 mg Intravenous Q12H Lenis Noon, RPH 150 mL/hr at 01/22/18 0539 750 mg at 01/22/18 9030     Discharge Medications: Please see discharge summary for a list of discharge medications.  Relevant Imaging Results:  Relevant Lab Results:   Additional Information [id number]  Weston Anna, LCSW

## 2018-01-22 NOTE — Progress Notes (Signed)
Physical Therapy Treatment Patient Details Name: Ricky [name]. MRN: [id number] DOB: [birth year] Today's Date: 01/22/2018    History of Present Illness  78 y.o. male with medical history significant for type 2 diabetes, hypertension, hyperlipidemia, depression, dementia, schizophrenia, CKD stage III who presented the ED from his assisted living facility with  fall and change in mental status, fever--sepsis;   Left shoulder x-ray was negative for acute fracture or dislocation    PT Comments    Pt is progressing well; decr assist needed overall today(requiring only min assist with scooting transfers after set up); recommend SNF if pt needs to be independent with transfers to return to ALF; continue PT POC;   Follow Up Recommendations  SNF(?vs return to ALF with assist?)     Equipment Recommendations  None recommended by PT    Recommendations for Other Services       Precautions / Restrictions Precautions Precautions: Fall Restrictions Weight Bearing Restrictions: No    Mobility  Bed Mobility Overal bed mobility: Needs Assistance Bed Mobility: Supine to Sit     Supine to sit: Min guard     General bed mobility comments: used rails and required increased time and effort to complete, min/guard to safely elevate trunk  Transfers Overall transfer level: Needs assistance Equipment used: None Transfers: Lateral/Scoot Transfers          Lateral/Scoot Transfers: Min assist General transfer comment: lateral scoot with min assist, L knee blocked by PT to allow wt transfer to LLE  Ambulation/Gait             General Gait Details: non-amb at baseline   Chief Strategy Officer    Modified Rankin (Stroke Patients Only)       Balance                                            Cognition Arousal/Alertness: Awake/alert Behavior During Therapy: WFL for tasks assessed/performed Overall Cognitive Status: History of  cognitive impairments - at baseline                                 General Comments: STM deficits at  baseline, pleasant and cooperative      Exercises General Exercises - Lower Extremity Ankle Circles/Pumps: AROM;Both;5 reps Long Arc Quad: AROM;AAROM;Both;5 reps    General Comments        Pertinent Vitals/Pain Pain Assessment: No/denies pain    Home Living                      Prior Function            PT Goals (current goals can now be found in the care plan section) Acute Rehab PT Goals Patient Stated Goal: none stated PT Goal Formulation: With patient Time For Goal Achievement: 01/31/18 Potential to Achieve Goals: Good Progress towards PT goals: Progressing toward goals    Frequency    Min 2X/week      PT Plan Current plan remains appropriate    Co-evaluation              AM-PAC PT "6 Clicks" Mobility   Outcome Measure  Help needed turning from your back to your side while in a flat bed without using  bedrails?: A Little Help needed moving from lying on your back to sitting on the side of a flat bed without using bedrails?: A Little Help needed moving to and from a bed to a chair (including a wheelchair)?: A Little Help needed standing up from a chair using your arms (e.g., wheelchair or bedside chair)?: Total Help needed to walk in hospital room?: Total Help needed climbing 3-5 steps with a railing? : Total 6 Click Score: 12    End of Session Equipment Utilized During Treatment: Gait belt Activity Tolerance: Patient tolerated treatment well Patient left: in chair;with call bell/phone within reach;with chair alarm set;Other (comment)(maximove pad) Nurse Communication: Mobility status PT Visit Diagnosis: Difficulty in walking, not elsewhere classified (R26.2)     Time: 1050-1109 PT Time Calculation (min) (ACUTE ONLY): 19 min  Charges:  $Therapeutic Activity: 8-22 mins                     Kenyon Ana, PT  Pager:  [phone] Acute Rehab Dept [facility]): [phone]   01/22/2018    [facility] 01/22/2018, 11:16 AM

## 2018-01-23 DIAGNOSIS — M2550 Pain in unspecified joint: Secondary | ICD-10-CM | POA: Diagnosis not present

## 2018-01-23 DIAGNOSIS — E559 Vitamin D deficiency, unspecified: Secondary | ICD-10-CM | POA: Diagnosis not present

## 2018-01-23 DIAGNOSIS — R531 Weakness: Secondary | ICD-10-CM | POA: Diagnosis not present

## 2018-01-23 DIAGNOSIS — E7800 Pure hypercholesterolemia, unspecified: Secondary | ICD-10-CM | POA: Diagnosis not present

## 2018-01-23 DIAGNOSIS — E039 Hypothyroidism, unspecified: Secondary | ICD-10-CM | POA: Diagnosis not present

## 2018-01-23 DIAGNOSIS — L89323 Pressure ulcer of left buttock, stage 3: Secondary | ICD-10-CM | POA: Diagnosis not present

## 2018-01-23 DIAGNOSIS — D649 Anemia, unspecified: Secondary | ICD-10-CM | POA: Diagnosis not present

## 2018-01-23 DIAGNOSIS — K219 Gastro-esophageal reflux disease without esophagitis: Secondary | ICD-10-CM | POA: Diagnosis not present

## 2018-01-23 DIAGNOSIS — R509 Fever, unspecified: Secondary | ICD-10-CM | POA: Diagnosis not present

## 2018-01-23 DIAGNOSIS — R7881 Bacteremia: Secondary | ICD-10-CM | POA: Diagnosis not present

## 2018-01-23 DIAGNOSIS — E030 Congenital hypothyroidism with diffuse goiter: Secondary | ICD-10-CM | POA: Diagnosis not present

## 2018-01-23 DIAGNOSIS — L0390 Cellulitis, unspecified: Secondary | ICD-10-CM | POA: Diagnosis not present

## 2018-01-23 DIAGNOSIS — Z7401 Bed confinement status: Secondary | ICD-10-CM | POA: Diagnosis not present

## 2018-01-23 DIAGNOSIS — A419 Sepsis, unspecified organism: Secondary | ICD-10-CM | POA: Diagnosis not present

## 2018-01-23 DIAGNOSIS — E119 Type 2 diabetes mellitus without complications: Secondary | ICD-10-CM | POA: Diagnosis not present

## 2018-01-23 DIAGNOSIS — Z5181 Encounter for therapeutic drug level monitoring: Secondary | ICD-10-CM | POA: Diagnosis not present

## 2018-01-23 DIAGNOSIS — L89152 Pressure ulcer of sacral region, stage 2: Secondary | ICD-10-CM | POA: Diagnosis not present

## 2018-01-23 DIAGNOSIS — R2689 Other abnormalities of gait and mobility: Secondary | ICD-10-CM | POA: Diagnosis not present

## 2018-01-23 DIAGNOSIS — L03116 Cellulitis of left lower limb: Secondary | ICD-10-CM | POA: Diagnosis not present

## 2018-01-23 DIAGNOSIS — E785 Hyperlipidemia, unspecified: Secondary | ICD-10-CM | POA: Diagnosis not present

## 2018-01-23 DIAGNOSIS — B9562 Methicillin resistant Staphylococcus aureus infection as the cause of diseases classified elsewhere: Secondary | ICD-10-CM | POA: Diagnosis not present

## 2018-01-23 DIAGNOSIS — Z79899 Other long term (current) drug therapy: Secondary | ICD-10-CM | POA: Diagnosis not present

## 2018-01-23 DIAGNOSIS — L97522 Non-pressure chronic ulcer of other part of left foot with fat layer exposed: Secondary | ICD-10-CM | POA: Diagnosis not present

## 2018-01-23 DIAGNOSIS — E11621 Type 2 diabetes mellitus with foot ulcer: Secondary | ICD-10-CM | POA: Diagnosis not present

## 2018-01-23 DIAGNOSIS — Z452 Encounter for adjustment and management of vascular access device: Secondary | ICD-10-CM | POA: Diagnosis not present

## 2018-01-23 DIAGNOSIS — R278 Other lack of coordination: Secondary | ICD-10-CM | POA: Diagnosis not present

## 2018-01-23 DIAGNOSIS — A4102 Sepsis due to Methicillin resistant Staphylococcus aureus: Secondary | ICD-10-CM | POA: Diagnosis not present

## 2018-01-23 DIAGNOSIS — F5101 Primary insomnia: Secondary | ICD-10-CM | POA: Diagnosis not present

## 2018-01-23 DIAGNOSIS — I10 Essential (primary) hypertension: Secondary | ICD-10-CM | POA: Diagnosis not present

## 2018-01-23 LAB — CULTURE, BLOOD (ROUTINE X 2)
Culture: NO GROWTH
Culture: NO GROWTH
Special Requests: ADEQUATE

## 2018-01-23 LAB — GLUCOSE, CAPILLARY
GLUCOSE-CAPILLARY: 187 mg/dL — AB (ref 70–99)
GLUCOSE-CAPILLARY: 218 mg/dL — AB (ref 70–99)
Glucose-Capillary: 170 mg/dL — ABNORMAL HIGH (ref 70–99)

## 2018-01-23 MED ORDER — HEPARIN SOD (PORK) LOCK FLUSH 100 UNIT/ML IV SOLN
250.0000 [IU] | INTRAVENOUS | Status: AC | PRN
  Administered 2018-01-23: 250 [IU]

## 2018-01-23 NOTE — Progress Notes (Signed)
Patient reviewed and evaluated briefly he is fine to go to facility today  No charge

## 2018-01-23 NOTE — Progress Notes (Signed)
Patient is set to discharge to [facility] today. Patient aware. Discharge packet given to RN, Janett Billow.  RN requested PTAR to be scheduled at 4:30PM  Please call report to: [place], [place] Worker [phone]

## 2018-01-23 NOTE — Discharge Summary (Signed)
Physician Discharge Summary  Ricky [name]. AJO:[id number] DOB: 03-28-1940 DOA: 01/16/2018  Patient stable since yesterday 1/2 and stabilized for discharge to skilled facility  PCP: Deland Pretty, MD  Admit date: 01/16/2018 Discharge date: 01/23/2018  Time spent: 45 minutes  Recommendations for Outpatient Follow-up:  1. Please ensure that patient is turned in the bed every 2 hours and wound care at the facility needs to follow-up and dress the area on the sacrum 3X2 X1 centimeters-pressure redistribution chair pad to use when out of bed in chair and follow-up as needed for right great toe and left great toe wounds-recommend either Prevalon boots or heel protector pads to both feet 2. Complete vancomycin via PICC line on 01/30/2018 and please pull PICC line at the time 3. Recommend follow-up with psychiatry for numerous psychiatric issues he has multiple medications that were refilled as below 4. Will need CBC differential and basic metabolic panel in about 1 week's time as an outpatient 5.  will need outpatient dermatology follow-up for right ear "horn"   Discharge Diagnoses:  Principal Problem:   MRSA bacteremia Active Problems:   Diabetes mellitus without complication ([place])   Hypertension   Depression   Hyperlipidemia   Schizophrenia ([place])   Sepsis ([place])   Left leg cellulitis   Stage III pressure ulcer of left buttock ([place])   Discharge Condition: Improved  Diet recommendation: Heart healthy low-salt  Filed Weights   01/21/18 0504 01/22/18 0541 01/23/18 0652  Weight: 99.1 kg 97.5 kg 98.5 kg    History of present illness:  78 year old man PMH including diabetes mellitus type 2, depression, schizophrenia, dementia presented after a fall and increasing confusion.  Noted to have fever, tachycardia and hypotension as well as hypoxia.  Admitted for sepsis unknown etiology.  Subsequently found to have MRSA bacteremia and left lower extremity cellulitis.  Clinically much  improved.  Plan for TEE 12/31.  Infectious disease following.  Hospital Course:  Sepsis secondary to MRSA bacteremia, secondary to left lower extremity cellulitis with leukocytosis, hypotension and fever on admission.  Chest x-ray negative, urinalysis negative.  Stage II pressure ulcer of the buttocks and diabetic ulcers of both first toes not felt to be grossly infected on admission.  Recently had debridement of wounds on first toes and podiatry office 12/20. --repeat blood cultures neg Sepsis has resolved, hemodynamics are stable.  --TTE inconclusive.  TEE performed as below --d/w Dr. Christene Lye needs to complete 14 days of IV Vancomycin for bacteremia-PICC placed and of duration 01/30/2018  Diabetes mellitus type 2 --In hospital was on Levemir and sliding scale insulin. Sugars relatively well controlled on discharge resume orals and sliding scale coverage note metformin was discontinued this admission and would not resume  Strep B bacteriuria.  Not clearly symptomatic but covered by vancomycin.  Acute hypoxic respiratory failure --Resolved.  Etiology unclear.  Hypertension --Remains stable.  Stage III pressure ulcer bilateral buttocks with diabetic ulcers bilateral first toes --Continue wound care --pressure redistribution chair pad is ordered for his use when OOB in the chair and he is to take this with him upon discharge to facilitate resolution of the buttock lesions and to reduce the incidence of chronic shearing of the buttock tissues. -Needs to be turned every 2 hours at facility  Dementia, schizophrenia --Remains stable.    Restarted home meds prescription given on discharge  Consultations:  Infectious disease  Procedures:  Echo Study Conclusions  - Echo windows are inadequate to evaluate for endocarditis. TEE is recommended if clinically indicated .  TEE 12/31-no vegetation   Discharge Exam: Vitals:   01/22/18 2200 01/23/18 0652  BP: (!)  143/95 (!) 170/99  Pulse: 68 75  Resp: 12 12  Temp: 97.7 F (36.5 C) (!) 97.5 F (36.4 C)  SpO2: 100% 95%    General: Awake alert pleasant no distress eating drinking Cardiovascular: S1-S2 no murmur rub or gallop Respiratory: Clinically clear no added sound Abdomen soft nontender turning the patient on the bottom there is macerated wound as per description above with wet periwound areas-patient has not been turning in the bed Legs examined and noted wounds as per above description  Discharge Instructions   Discharge Instructions    Diet - low sodium heart healthy   Complete by:  As directed    Home infusion instructions [facility] May follow [facility] Dosing Protocol; May administer Cathflo as needed to maintain patency of vascular access device.; Flushing of vascular access device: per [facility] Protocol: 0.9% NaCl pre/post medica...   Complete by:  As directed    Instructions:  May follow [place] Dosing Protocol   Instructions:  May administer Cathflo as needed to maintain patency of vascular access device.   Instructions:  Flushing of vascular access device: per [facility] Protocol: 0.9% NaCl pre/post medication administration and prn patency; Heparin 100 u/ml, 19m for implanted ports and Heparin 10u/ml, 557mfor all other central venous catheters.   Instructions:  May follow [facility] Anaphylaxis Protocol for First Dose Administration in the home: 0.9% NaCl at 25-50 ml/hr to maintain IV access for protocol meds. Epinephrine 0.3 ml IV/IM PRN and Benadryl 25-50 IV/IM PRN s/s of anaphylaxis.   Instructions:  [facility] Coord[facility] (RN) to assist per patient IV care needs in the home PRN.   Increase activity slowly   Complete by:  As directed      Allergies as of 01/23/2018      Reactions   Asa [aspirin]    "Dr told me not to take it"   Codeine Other (See Comments)   DIZZINESS with Tylenol 3   Tylenol [acetaminophen] Other (See Comments)   Dizziness with tylenol 3       Medication List    STOP taking these medications   chlorhexidine 0.12 % solution Commonly known as:  PERIDEX   feeding supplement (PRO-STAT SUGAR FREE 64) Liqd   guaiFENesin 600 MG 12 hr tablet Commonly known as:  MUCINEX   magnesium hydroxide 400 MG/5ML suspension Commonly known as:  MILK OF MAGNESIA   meloxicam 15 MG tablet Commonly known as:  MOBIC   metFORMIN 500 MG tablet Commonly known as:  GLUCOPHAGE   omega-3 acid ethyl esters 1 g capsule Commonly known as:  LOVAZA   ONGLYZA 5 MG Tabs tablet Generic drug:  saxagliptin HCl   polyethylene glycol packet Commonly known as:  MIRALAX / GLYCOLAX   PROSTATE Tabs     TAKE these medications   ALPRAZolam 0.5 MG tablet Commonly known as:  XANAX Take 1 tablet (0.5 mg total) by mouth at bedtime.   amLODipine 5 MG tablet Commonly known as:  NORVASC Take 5 mg by mouth daily.   CITRACAL + D PO Take 1 tablet by mouth daily.   docusate sodium 100 MG capsule Commonly known as:  COLACE Take 100 mg by mouth 2 (two) times daily.   empagliflozin 10 MG Tabs tablet Commonly known as:  JARDIANCE Take 10 mg by mouth daily.   fluvoxaMINE 100 MG tablet Commonly known as:  LUVOX Take  1 tablet (100 mg total) by mouth at bedtime.   furosemide 20 MG tablet Commonly known as:  LASIX Take 20 mg by mouth daily before breakfast.   HUMALOG KWIKPEN 100 UNIT/ML KwikPen Generic drug:  insulin lispro Inject 2-11 Units into the skin See admin instructions. Times: 0630 1130 1630  Scale: 101-150: 2 units 151-200: 3 units 201-250: 5 units 251-300: 7 units 301-350: 9 units >350: 11 units   hydrOXYzine 25 MG tablet Commonly known as:  ATARAX/VISTARIL Take 1 tablet (25 mg total) by mouth at bedtime as needed (sleep).   insulin detemir 100 UNIT/ML injection Commonly known as:  LEVEMIR Inject 0.3 mLs (30 Units total) into the skin at bedtime. What changed:  how much to take   MYRBETRIQ 25 MG Tb24 tablet Generic drug:   mirabegron ER Take 25 mg by mouth daily.   OLANZapine 5 MG tablet Commonly known as:  ZYPREXA Take 5 tablets (25 mg total) by mouth daily. What changed:    how to take this  when to take this   omeprazole 40 MG capsule Commonly known as:  PRILOSEC Take 40 mg by mouth daily.   pravastatin 40 MG tablet Commonly known as:  PRAVACHOL Take 40 mg by mouth daily after lunch.   tamsulosin 0.4 MG Caps capsule Commonly known as:  FLOMAX Take 0.4 mg by mouth daily.   THERAGRAN-M PREMIER 50 PLUS PO Take 1 tablet by mouth daily.   vancomycin  IVPB Inject 750 mg into the vein every 12 (twelve) hours for 9 days. Indication:  MRSA bacteremia Last Day of Therapy:  01/31/18 Labs - Sunday/Monday:  CBC/D, BMP, and vancomycin trough. Labs - Thursday:  BMP and vancomycin trough Labs - Every other week:  ESR and CRP   Vancomycin 750-5 MG/150ML-% Soln Commonly known as:  VANCOCIN Inject 150 mLs (750 mg total) into the vein every 12 (twelve) hours.   Vitamin D-3 25 MCG (1000 UT) Caps Take 1 capsule by mouth daily.   zolpidem 5 MG tablet Commonly known as:  AMBIEN Take 1 tablet (5 mg total) by mouth at bedtime.            Home Infusion Instuctions  (From admission, onward)         Start     Ordered   01/22/18 0000  Home infusion instructions [facility] May follow [facility] Dosing Protocol; May administer Cathflo as needed to maintain patency of vascular access device.; Flushing of vascular access device: per [facility] Protocol: 0.9% NaCl pre/post medica...    Question Answer Comment  Instructions May follow [facility] Dosing Protocol   Instructions May administer Cathflo as needed to maintain patency of vascular access device.   Instructions Flushing of vascular access device: per [facility] Protocol: 0.9% NaCl pre/post medication administration and prn patency; Heparin 100 u/ml, 5ml for implanted ports and Heparin 10u/ml, 5ml for all other central venous catheters.   Instructions  May follow [facility] Anaphylaxis Protocol for First Dose Administration in the home: 0.9% NaCl at 25-50 ml/hr to maintain IV access for protocol meds. Epinephrine 0.3 ml IV/IM PRN and Benadryl 25-50 IV/IM PRN s/s of anaphylaxis.   Instructions [facility] Infusion Coordinator (RN) to assist per patient IV care needs in the home PRN.      01 /02/20 1034         Allergies  Allergen Reactions  . Asa [Aspirin]     "Dr told me not to take it"  . Codeine Other (See Comments)  DIZZINESS with Tylenol 3  . Tylenol [Acetaminophen] Other (See Comments)    Dizziness with tylenol 3      The results of significant diagnostics from this hospitalization (including imaging, microbiology, ancillary and laboratory) are listed below for reference.    Significant Diagnostic Studies: Dg Chest 2 View  Result Date: 01/16/2018 CLINICAL DATA:  Fever with low O2 saturation EXAM: CHEST - 2 VIEW COMPARISON:  May 08, 2017 FINDINGS: The heart size and mediastinal contours are stable. Mild central pulmonary vessel prominence is noted. There is minimal left pleural effusion. There is no focal pneumonia. Minimal posterior pleural effusions are noted. The visualized skeletal structures are stable. IMPRESSION: No focal pneumonia. Mild central pulmonary vascular congestion. Minimal posterior pleural effusions. Electronically Signed   By: Abelardo Diesel M.D.   On: 01/16/2018 19:50   Ct Head Wo Contrast  Result Date: 01/16/2018 CLINICAL DATA:  Patient fell in bathroom and nursing facility. Fever. EXAM: CT HEAD WITHOUT CONTRAST CT CERVICAL SPINE WITHOUT CONTRAST TECHNIQUE: Multidetector CT imaging of the head and cervical spine was performed following the standard protocol without intravenous contrast. Multiplanar CT image reconstructions of the cervical spine were also generated. COMPARISON:  And CT 12/10/2012 and MRI 05/09/2017 FINDINGS: CT HEAD FINDINGS Brain: Stable age related involutional changes of brain with  chronic small vessel ischemic disease. Negative for acute large vascular territory infarct, hemorrhage, intra-axial mass nor extra-axial fluid collections. No edema or midline shift. Vascular: No hyperdense vessel sign. Atherosclerosis of the skull base. Skull: Intact without skull fracture. No suspicious osseous lesions. Sinuses/Orbits: Bilateral cataract extractions. Intact orbits and globes. Minimal ethmoid sinus mucosal thickening. Other: Clear mastoids. CT CERVICAL SPINE FINDINGS Alignment: Maintained cervical lordosis. Skull base and vertebrae: Motion related artifacts across the C6-7 interspace limit assessment. Intact skull base. Intact atlantodental interval and craniocervical relationship. No acute cervical spine fracture. Soft tissues and spinal canal: No prevertebral fluid or swelling. No visible canal hematoma. Disc levels: There is mild disc space narrowing from C2 through C6 and moderate at C6-7 through T1-T2. Minimal anterolisthesis grade 1 of C5 and C6 is identified. There is uncovertebral joint osteoarthritis and spurring greatest at C6-7 bilaterally. Bilateral multilevel degenerative facet arthropathy is seen. Upper chest: Clear lung apices. Other: Moderate extracranial carotid arteriosclerosis. IMPRESSION: 1. Chronic small vessel ischemic disease of periventricular white matter. No acute intracranial abnormality. 2. Cervical spondylosis without acute cervical spine fracture. Electronically Signed   By: Ashley Royalty M.D.   On: 01/16/2018 19:55   Ct Cervical Spine Wo Contrast  Result Date: 01/16/2018 CLINICAL DATA:  Patient fell in bathroom and nursing facility. Fever. EXAM: CT HEAD WITHOUT CONTRAST CT CERVICAL SPINE WITHOUT CONTRAST TECHNIQUE: Multidetector CT imaging of the head and cervical spine was performed following the standard protocol without intravenous contrast. Multiplanar CT image reconstructions of the cervical spine were also generated. COMPARISON:  And CT 12/10/2012 and MRI  05/09/2017 FINDINGS: CT HEAD FINDINGS Brain: Stable age related involutional changes of brain with chronic small vessel ischemic disease. Negative for acute large vascular territory infarct, hemorrhage, intra-axial mass nor extra-axial fluid collections. No edema or midline shift. Vascular: No hyperdense vessel sign. Atherosclerosis of the skull base. Skull: Intact without skull fracture. No suspicious osseous lesions. Sinuses/Orbits: Bilateral cataract extractions. Intact orbits and globes. Minimal ethmoid sinus mucosal thickening. Other: Clear mastoids. CT CERVICAL SPINE FINDINGS Alignment: Maintained cervical lordosis. Skull base and vertebrae: Motion related artifacts across the C6-7 interspace limit assessment. Intact skull base. Intact atlantodental interval and craniocervical relationship. No acute cervical  spine fracture. Soft tissues and spinal canal: No prevertebral fluid or swelling. No visible canal hematoma. Disc levels: There is mild disc space narrowing from C2 through C6 and moderate at C6-7 through T1-T2. Minimal anterolisthesis grade 1 of C5 and C6 is identified. There is uncovertebral joint osteoarthritis and spurring greatest at C6-7 bilaterally. Bilateral multilevel degenerative facet arthropathy is seen. Upper chest: Clear lung apices. Other: Moderate extracranial carotid arteriosclerosis. IMPRESSION: 1. Chronic small vessel ischemic disease of periventricular white matter. No acute intracranial abnormality. 2. Cervical spondylosis without acute cervical spine fracture. Electronically Signed   By: Ashley Royalty M.D.   On: 01/16/2018 19:55   Dg Shoulder Left  Result Date: 01/16/2018 CLINICAL DATA:  Status post fall today with left shoulder pain. EXAM: LEFT SHOULDER - 2+ VIEW COMPARISON:  July 29, 2010 FINDINGS: There is osteophyte formation and high-riding left humerus. There is no acute fracture or dislocation. IMPRESSION: No acute fracture or dislocation. Electronically Signed   By: Abelardo Diesel M.D.   On: 01/16/2018 19:52   [place] Ekg Site Rite  Result Date: 01/21/2018 If Site Rite image not attached, placement could not be confirmed due to current cardiac rhythm.   Microbiology: Recent Results (from the past 240 hour(s))  Culture, blood (Routine x 2)     Status: Abnormal   Collection Time: 01/16/18  7:05 PM  Result Value Ref Range Status   Specimen Description   Final    BLOOD LEFT FOREARM Performed at [facility], [place] [address], [place], [place] [address]    Special Requests   Final    BOTTLES DRAWN AEROBIC AND ANAEROBIC Blood Culture adequate volume Performed at [facility] [address], [place], [place] [address]    Culture  Setup Time   Final    GRAM POSITIVE COCCI IN BOTH AEROBIC AND ANAEROBIC BOTTLES CRITICAL RESULT CALLED TO, READ BACK BY AND VERIFIED WITH:  [place], T 1755 [id number] FCP  Performed at [facility], [place] [address] [place], [place] [address]    Culture METHICILLIN RESISTANT STAPHYLOCOCCUS AUREUS (A)  Final   Report Status 01/19/2018 FINAL  Final   Organism ID, Bacteria METHICILLIN RESISTANT STAPHYLOCOCCUS AUREUS  Final      Susceptibility   Methicillin resistant staphylococcus aureus - MIC*    CIPROFLOXACIN >=8 RESISTANT Resistant     ERYTHROMYCIN >=8 RESISTANT Resistant     GENTAMICIN <=0.5 SENSITIVE Sensitive     OXACILLIN >=4 RESISTANT Resistant     TETRACYCLINE <=1 SENSITIVE Sensitive     VANCOMYCIN <=0.5 SENSITIVE Sensitive     TRIMETH/SULFA <=10 SENSITIVE Sensitive     CLINDAMYCIN <=0.25 SENSITIVE Sensitive     RIFAMPIN <=0.5 SENSITIVE Sensitive     Inducible Clindamycin NEGATIVE Sensitive     * METHICILLIN RESISTANT STAPHYLOCOCCUS AUREUS  Blood Culture ID Panel (Reflexed)     Status: Abnormal   Collection Time: 01/16/18  7:05 PM  Result Value Ref Range Status   Enterococcus species NOT DETECTED NOT DETECTED Final   Listeria monocytogenes NOT DETECTED NOT DETECTED Final    Staphylococcus species DETECTED (A) NOT DETECTED Final    Comment: CRITICAL RESULT CALLED TO, READ BACK BY AND VERIFIED WITH:  PHARMD [place], T [phone] [id number] FCP    Staphylococcus aureus (BCID) DETECTED (A) NOT DETECTED Final    Comment: Methicillin (oxacillin)-resistant Staphylococcus aureus (MRSA). MRSA is predictably resistant to beta-lactam antibiotics (except ceftaroline). Preferred therapy is vancomycin unless clinically contraindicated. Patient requires contact precautions if  hospitalized. CRITICAL RESULT CALLED TO, READ BACK BY AND VERIFIED WITH:  PHARMD P[place], T 1755 [id number] FCP    Methicillin resistance DETECTED (A) NOT DETECTED Final    Comment: CRITICAL RESULT CALLED TO, READ BACK BY AND VERIFIED WITH:  PHARMD P[place]ER[place], T 1755 [id number] FCP    Streptococcus species NOT DETECTED NOT DETECTED Final   Streptococcus agalactiae NOT DETECTED NOT DETECTED Final   Streptococcus pneumoniae NOT DETECTED NOT DETECTED Final   Streptococcus pyogenes NOT DETECTED NOT DETECTED Final   Acinetobacter baumannii NOT DETECTED NOT DETECTED Final   Enterobacteriaceae species NOT DETECTED NOT DETECTED Final   Enterobacter cloacae complex NOT DETECTED NOT DETECTED Final   Escherichia coli NOT DETECTED NOT DETECTED Final   Klebsiella oxytoca NOT DETECTED NOT DETECTED Final   Klebsiella pneumoniae NOT DETECTED NOT DETECTED Final   Proteus species NOT DETECTED NOT DETECTED Final   Serratia marcescens NOT DETECTED NOT DETECTED Final   Haemophilus influenzae NOT DETECTED NOT DETECTED Final   Neisseria meningitidis NOT DETECTED NOT DETECTED Final   Pseudomonas aeruginosa NOT DETECTED NOT DETECTED Final   Candida albicans NOT DETECTED NOT DETECTED Final   Candida glabrata NOT DETECTED NOT DETECTED Final   Candida krusei NOT DETECTED NOT DETECTED Final   Candida parapsilosis NOT DETECTED NOT DETECTED Final   Candida tropicalis NOT DETECTED NOT DETECTED Final    Comment: Performed at [facility], [place]. [address] [place], [place] [address]  Culture, blood (Routine x 2)     Status: Abnormal   Collection Time: 01/16/18  7:06 PM  Result Value Ref Range Status   Specimen Description   Final    BLOOD LEFT ARM UPPER Performed at [facility] [address], [place], [place] [address]    Special Requests   Final    BOTTLES DRAWN AEROBIC AND ANAEROBIC Blood Culture results may not be optimal due to an inadequate volume of blood received in culture bottles Performed at [facility] [address], [place], [place] [address]    Culture  Setup Time   Final    GRAM POSITIVE COCCI IN BOTH AEROBIC AND ANAEROBIC BOTTLES CRITICAL VALUE NOTED.  VALUE IS CONSISTENT WITH PREVIOUSLY REPORTED AND CALLED VALUE.    Culture (A)  Final    STAPHYLOCOCCUS AUREUS SUSCEPTIBILITIES PERFORMED ON PREVIOUS CULTURE WITHIN THE LAST 5 DAYS. STAPHYLOCOCCUS SPECIES (COAGULASE NEGATIVE) THE SIGNIFICANCE OF ISOLATING THIS ORGANISM FROM A SINGLE SET OF BLOOD CULTURES WHEN MULTIPLE SETS ARE DRAWN IS UNCERTAIN. PLEASE NOTIFY THE MICROBIOLOGY DEPARTMENT WITHIN ONE WEEK IF SPECIATION AND SENSITIVITIES ARE REQUIRED. Performed at [facility], [place] [address], [place], [place] [address]    Report Status 01/20/2018 FINAL  Final  Urine culture     Status: Abnormal   Collection Time: 01/16/18  9:43 PM  Result Value Ref Range Status   Specimen Description URINE, CLEAN CATCH  Final   Special Requests   Final    NONE Performed at [facility] [address] [place], [place] [address]    Culture (A)  Final    >=100,000 COLONIES/mL GROUP B STREP(S.AGALACTIAE)ISOLATED TESTING AGAINST S. AGALACTIAE NOT ROUTINELY PERFORMED DUE TO PREDICTABILITY OF AMP/PEN/VAN SUSCEPTIBILITY.    Report Status 01/18/2018 FINAL  Final  MRSA PCR Screening     Status: Abnormal   Collection Time: 01/17/18 12:56 AM  Result Value Ref Range Status   MRSA by PCR POSITIVE (A)  NEGATIVE Final    Comment:  The GeneXpert MRSA Assay (FDA approved for NASAL specimens only), is one component of a comprehensive MRSA colonization surveillance program. It is not intended to diagnose MRSA infection nor to guide or monitor treatment for MRSA infections. RESULT CALLED TO, READ BACK BY AND VERIFIED WITH: Idelia Salm RN @0310  ON 01/17/18 [place] Speck Performed at [facility], [place] [address] [place], [place] [address]   Culture, blood (Routine X 2) w Reflex to ID Panel     Status: None   Collection Time: 01/18/18 12:44 PM  Result Value Ref Range Status   Specimen Description   Final    BLOOD RIGHT HAND Performed at [facility] [address], [place], [place] [address]    Special Requests   Final    BOTTLES DRAWN AEROBIC ONLY Blood Culture results may not be optimal due to an inadequate volume of blood received in culture bottles Performed at [facility] [address], [place], [place] [address]    Culture   Final    NO GROWTH 5 DAYS Performed at [facility], [place] [address], [place], [place] [address]    Report Status 01/23/2018 FINAL  Final  Culture, blood (Routine X 2) w Reflex to ID Panel     Status: None   Collection Time: 01/18/18 12:54 PM  Result Value Ref Range Status   Specimen Description   Final    BLOOD LEFT HAND Performed at [facility] [address] [place], [place] [address]    Special Requests   Final    BOTTLES DRAWN AEROBIC ONLY Blood Culture adequate volume Performed at [facility] [address] [place], [place] [address]    Culture   Final    NO GROWTH 5 DAYS Performed at [facility], [place] [address] [place],  [address]    Report Status 01/23/2018 FINAL  Final     Labs: Basic Metabolic Panel: Recent Labs  Lab 01/16/18 1904 01/18/18 0602 01/20/18 0629 01/22/18 0339  NA 137 140 141 139  K 3.8 4.2 3.8  3.6  CL 97* 106 104 100  CO2 26 26 28 28   GLUCOSE 167* 179* 114* 231*  BUN 21 17 17 15   CREATININE 1.33* 1.11 0.98 0.96  CALCIUM 9.1 8.9 9.1 9.2   Liver Function Tests: Recent Labs  Lab 01/16/18 1904 01/22/18 0339  AST 31 35  ALT 16 38  ALKPHOS 94 71  BILITOT 1.2 0.6  PROT 8.2* 7.0  ALBUMIN 4.1 3.3*   No results for input(s): LIPASE, AMYLASE in the last 168 hours. No results for input(s): AMMONIA in the last 168 hours. CBC: Recent Labs  Lab 01/16/18 1904 01/18/18 0602 01/22/18 0339  WBC 22.9* 9.8 6.8  NEUTROABS 20.6*  --  4.0  HGB 13.3 12.0* 12.9*  HCT 42.7 40.2 41.7  MCV 91.6 96.2 93.1  PLT 259 162 211   Cardiac Enzymes: No results for input(s): CKTOTAL, CKMB, CKMBINDEX, TROPONINI in the last 168 hours. BNP: BNP (last 3 results) No results for input(s): BNP in the last 8760 hours.  ProBNP (last 3 results) No results for input(s): PROBNP in the last 8760 hours.  CBG: Recent Labs  Lab 01/22/18 0737 01/22/18 1240 01/22/18 1654 01/22/18 2156 01/23/18 0759  GLUCAP 160* 222* 168* 148* 170*       Signed:  Nita Sells MD   [facility] 01/23/2018, 11:36 AM

## 2018-01-23 NOTE — Progress Notes (Signed)
Pt discharged to [facility] today per Dr. Verlon Au. Pt's VSS. Pt's PIV d/c'd and WDL. Pt's PICC to RUA patent and WDL. Flushed per protocol per IV team. Report called to [facility], RN. Verbalized understanding. Pt left floor via stretcher accompanied by EMT's in stable condition.

## 2018-01-26 DIAGNOSIS — A4102 Sepsis due to Methicillin resistant Staphylococcus aureus: Secondary | ICD-10-CM | POA: Diagnosis not present

## 2018-01-26 DIAGNOSIS — I10 Essential (primary) hypertension: Secondary | ICD-10-CM | POA: Diagnosis not present

## 2018-01-26 DIAGNOSIS — L03116 Cellulitis of left lower limb: Secondary | ICD-10-CM | POA: Diagnosis not present

## 2018-01-26 DIAGNOSIS — E11621 Type 2 diabetes mellitus with foot ulcer: Secondary | ICD-10-CM | POA: Diagnosis not present

## 2018-01-28 DIAGNOSIS — L0390 Cellulitis, unspecified: Secondary | ICD-10-CM | POA: Diagnosis not present

## 2018-01-28 DIAGNOSIS — L89152 Pressure ulcer of sacral region, stage 2: Secondary | ICD-10-CM | POA: Diagnosis not present

## 2018-01-28 DIAGNOSIS — R7881 Bacteremia: Secondary | ICD-10-CM | POA: Diagnosis not present

## 2018-02-04 DIAGNOSIS — E11621 Type 2 diabetes mellitus with foot ulcer: Secondary | ICD-10-CM | POA: Diagnosis not present

## 2018-02-04 DIAGNOSIS — L97522 Non-pressure chronic ulcer of other part of left foot with fat layer exposed: Secondary | ICD-10-CM | POA: Diagnosis not present

## 2018-02-04 DIAGNOSIS — I10 Essential (primary) hypertension: Secondary | ICD-10-CM | POA: Diagnosis not present

## 2018-02-04 DIAGNOSIS — L89323 Pressure ulcer of left buttock, stage 3: Secondary | ICD-10-CM | POA: Diagnosis not present

## 2018-02-04 DIAGNOSIS — A4102 Sepsis due to Methicillin resistant Staphylococcus aureus: Secondary | ICD-10-CM | POA: Diagnosis not present

## 2018-02-05 ENCOUNTER — Telehealth: Payer: Self-pay | Admitting: Psychiatry

## 2018-02-05 NOTE — Telephone Encounter (Signed)
Ricky [name] called to state that the facility Ricky [name] is at is trying to get him to see a psychiatrist or mental health provider there. Ricky [name] does not want that  And Ricky [name] has told them Ricky [name] already sees a psychiatrist. I think Ricky [name] is just concerned about what to do. Not sure Ricky [name] needs a call back.

## 2018-02-06 NOTE — Telephone Encounter (Signed)
Patient called yesterday asking that we complement trials where nursing home where he is a patient and speak with social worker Rockney Ghee.  He is concerned that they are not getting another psychiatrist involved and they may want to change his meds and is very worried about that because of difficulty with other antipsychotic medications in the past.  Placed call to Ms. Rockney Ghee and could not reach her but was able to leave a message asking her to call me.  Hiram Comber, MD, DFAPA

## 2018-02-09 NOTE — Telephone Encounter (Signed)
Again called [facility] where patient is resident.  Left message for social worker Rockney Ghee to call regarding information as to whether or not there is anticipated med changes for him.  We have tried a number of different medications in the past and he has done the best on this regimen.  He is somewhat EPS prone.  If the situation or his status is changed and there could be consideration for medication adjustments.  Lynder Parents, MD, DFAPA

## 2018-02-11 DIAGNOSIS — L89152 Pressure ulcer of sacral region, stage 2: Secondary | ICD-10-CM | POA: Diagnosis not present

## 2018-02-11 DIAGNOSIS — R7881 Bacteremia: Secondary | ICD-10-CM | POA: Diagnosis not present

## 2018-02-17 DIAGNOSIS — M25512 Pain in left shoulder: Secondary | ICD-10-CM | POA: Diagnosis not present

## 2018-02-17 DIAGNOSIS — M75102 Unspecified rotator cuff tear or rupture of left shoulder, not specified as traumatic: Secondary | ICD-10-CM | POA: Diagnosis not present

## 2018-02-17 DIAGNOSIS — I10 Essential (primary) hypertension: Secondary | ICD-10-CM | POA: Diagnosis not present

## 2018-02-17 DIAGNOSIS — E11621 Type 2 diabetes mellitus with foot ulcer: Secondary | ICD-10-CM | POA: Diagnosis not present

## 2018-02-23 DIAGNOSIS — M25512 Pain in left shoulder: Secondary | ICD-10-CM | POA: Diagnosis not present

## 2018-03-05 ENCOUNTER — Ambulatory Visit: Payer: Medicare Other | Admitting: Psychiatry

## 2018-03-20 ENCOUNTER — Encounter: Payer: Self-pay | Admitting: Podiatry

## 2018-03-20 ENCOUNTER — Ambulatory Visit: Payer: Medicare Other | Admitting: Podiatry

## 2018-03-20 DIAGNOSIS — M79609 Pain in unspecified limb: Secondary | ICD-10-CM | POA: Diagnosis not present

## 2018-03-20 DIAGNOSIS — E119 Type 2 diabetes mellitus without complications: Secondary | ICD-10-CM | POA: Diagnosis not present

## 2018-03-20 DIAGNOSIS — M2010 Hallux valgus (acquired), unspecified foot: Secondary | ICD-10-CM

## 2018-03-20 DIAGNOSIS — B351 Tinea unguium: Secondary | ICD-10-CM | POA: Diagnosis not present

## 2018-03-20 NOTE — Progress Notes (Signed)
Complaint:  Visit Type: Patient returns to my office for continued preventative foot care services. Complaint: Patient states" my nails have grown long and thick and become painful to walk and wear shoes" Patient has been diagnosed with DM with no foot complications. . The patient presents for preventative foot care services.  Patient says the skin lesion on his left big toe is bandaged and does not wish the bandage removed.  Patient has callus right hallux with no bandage.   He presents to the office for preventative foot care services.   No changes to ROS.  Patient says his shoes have been stretched.    Podiatric Exam: Vascular: dorsalis pedis and posterior tibial pulses are palpable bilateral. Capillary return is immediate. Temperature gradient is WNL. Skin turgor WNL  Swelling noted both feet. Sensorium: Diminished  Semmes Weinstein monofilament test. Diminished  tactile sensation bilaterally. Nail Exam: Pt has thick disfigured discolored nails with subungual debris noted entire nail hallux through fifth toenails right foot and multiple nails left foot. Ulcer Exam: Right hallux is bandaged and covered.  The pinch callus right hallux is covered with necrotic tissue and callus. Orthopedic Exam: Muscle tone and strength are WNL. No limitations in general ROM. No crepitus or effusions noted. Foot type and digits show no abnormalities. Bony prominences are unremarkable. Skin: No Porokeratosis. No infection or ulcers  Diagnosis:  Onychomycosis, , Pain in right toe, pain in left toes,  Diabetic ulcers  Digits  B/L.  Treatment & Plan Procedures and Treatment: Consent by patient was obtained for treatment procedures.   Debridement of mycotic and hypertrophic toenails, 1 through 5 bilateral and clearing of subungual debris.  Debridement of necrotic tissue covering her skin lesions 1 right.. Neosporin/DSD.  RTC 10 weeks for nail care.  Patient to continue for ulcer treatment by the home.     Return  Visit-Office Procedure: Patient instructed to return to the office for a follow up visit 3 months for continued evaluation and treatment.    Gardiner Barefoot DPM

## 2018-03-22 DIAGNOSIS — I10 Essential (primary) hypertension: Secondary | ICD-10-CM | POA: Diagnosis not present

## 2018-03-22 DIAGNOSIS — E11621 Type 2 diabetes mellitus with foot ulcer: Secondary | ICD-10-CM | POA: Diagnosis not present

## 2018-03-22 DIAGNOSIS — K219 Gastro-esophageal reflux disease without esophagitis: Secondary | ICD-10-CM | POA: Diagnosis not present

## 2018-03-23 DIAGNOSIS — M25512 Pain in left shoulder: Secondary | ICD-10-CM | POA: Diagnosis not present

## 2018-03-30 ENCOUNTER — Telehealth: Payer: Self-pay | Admitting: Psychiatry

## 2018-03-30 NOTE — Telephone Encounter (Signed)
Called Facility back and spoke with Medical records. They are going to send [place] his MAR as soon as they get a chance.

## 2018-03-30 NOTE — Telephone Encounter (Signed)
The patient called over the weekend stating the same things.  He has early stages of dementia and makes phone calls and then does not remember what he discussed.  He is chronically paranoid and has some intrusive chronic thoughts that he needs to go to the hospital for very vague reasons.  There are no psychiatric reasons for hospitalization.  His antipsychotic may need to be increased.  We will contact [facility] nursing home where he is a resident and get them to fax over copy as his current medication list and dosages.  He may have to have his antipsychotic increased.  Lynder Parents, MD, DFAPA

## 2018-03-30 NOTE — Telephone Encounter (Signed)
Patient left vm on 03/08 stating he is going through some rough times, felt like he needed to go to the hospital, and the people at the rehabilitation center felt like he did not need to go to hospital but he really needs help right now

## 2018-03-30 NOTE — Telephone Encounter (Signed)
I tried calling the facility but no one answered so I faxed over a request for his MAR. I will let you know as soon as it is faxed back.

## 2018-03-31 NOTE — Telephone Encounter (Signed)
Received Pt.s MAR today. I clipped it to the chart and placed it in your box.

## 2018-04-08 DIAGNOSIS — E1122 Type 2 diabetes mellitus with diabetic chronic kidney disease: Secondary | ICD-10-CM | POA: Diagnosis not present

## 2018-04-10 ENCOUNTER — Telehealth: Payer: Self-pay | Admitting: Psychiatry

## 2018-04-10 NOTE — Telephone Encounter (Signed)
Morgan [id number] unit 2 nurse  RTC  FU scheduled Wed.  Calmer at present.  At the last 2 days he's been anxious about not being able to see the usual visitors.  Now seems reassured about seeing me on Wednesday.  UA pending. To rule out UTI.  She's been with him for 9 mos and he's not been this way before.  No change indicated.  Lynder Parents, MD, DFAPA

## 2018-04-10 NOTE — Telephone Encounter (Signed)
Pt is having a very bad episode. Pt is at [facility]. Pt is throwing things and saying satan is in his room. Would like advise on his meds. [place] unit 2 nurse

## 2018-04-11 DIAGNOSIS — Z79899 Other long term (current) drug therapy: Secondary | ICD-10-CM | POA: Diagnosis not present

## 2018-04-11 DIAGNOSIS — R319 Hematuria, unspecified: Secondary | ICD-10-CM | POA: Diagnosis not present

## 2018-04-11 DIAGNOSIS — N390 Urinary tract infection, site not specified: Secondary | ICD-10-CM | POA: Diagnosis not present

## 2018-04-15 ENCOUNTER — Encounter: Payer: Self-pay | Admitting: Psychiatry

## 2018-04-15 ENCOUNTER — Other Ambulatory Visit: Payer: Self-pay

## 2018-04-15 ENCOUNTER — Ambulatory Visit (INDEPENDENT_AMBULATORY_CARE_PROVIDER_SITE_OTHER): Payer: Medicare Other | Admitting: Psychiatry

## 2018-04-15 DIAGNOSIS — F200 Paranoid schizophrenia: Secondary | ICD-10-CM

## 2018-04-15 DIAGNOSIS — F5105 Insomnia due to other mental disorder: Secondary | ICD-10-CM | POA: Diagnosis not present

## 2018-04-15 DIAGNOSIS — G301 Alzheimer's disease with late onset: Secondary | ICD-10-CM | POA: Diagnosis not present

## 2018-04-15 DIAGNOSIS — F411 Generalized anxiety disorder: Secondary | ICD-10-CM | POA: Diagnosis not present

## 2018-04-15 DIAGNOSIS — F0281 Dementia in other diseases classified elsewhere with behavioral disturbance: Secondary | ICD-10-CM

## 2018-04-15 NOTE — Progress Notes (Signed)
Ricky [name] [id number] January 14, 1941 78 y.o.  Subjective:   Patient ID:  Ricky [name]. is a 78 y.o. (DOB [birth year]) male.  Chief Complaint:  Chief Complaint  Patient presents with  . Anxiety  . Paranoid  . Memory Loss    HPI Ricky [name]. presents to the office today for follow-up of schizophrenia and dementia.  Resident of [facility].  Last seen July.  NH shut down travel unless emergency.  Still thinks God is mad at him and others also.  Able to read the Bible right now but sometimes cause paranoia.  Upset friend Ricky [name] can't visit.  STM problems.  Doesn't want to change psychiatrists as they had pushed over having a NH psychiatrist take over his care.  Stressed by [employer] and not able to get out with [place].  Not depressed.. Struggles with self identity and anxiety.  [facility] has called with occasional behavioral disturbances.  Past Psychiatric Medication Trials:  All of them are unknown, haloperidol alone, serentil,, benztropine, Xanax, Stelazine, Moban, perphenazine, paroxetine, risperidone, Thorazine, Seroquel 800 mg, sertraline side effects, loxapine, trazodone no response he has been on Zyprexa since September 2006 and that has produced the best control of his paranoia.,  Hydroxyzine   Review of Systems:  Review of Systems  Genitourinary: Positive for enuresis.  Musculoskeletal: Positive for arthralgias, back pain and gait problem.  Neurological: Positive for dizziness, tremors and weakness.  Psychiatric/Behavioral: Positive for confusion, decreased concentration and sleep disturbance. Negative for self-injury and suicidal ideas. The patient is nervous/anxious.     Medications: I have reviewed the patient's current medications.  Current Outpatient Medications  Medication Sig Dispense Refill  . ALPRAZolam (XANAX) 0.5 MG tablet Take 1 tablet (0.5 mg total) by mouth at bedtime. 2 tablet 0  . amLODipine (NORVASC) 5 MG tablet Take 5 mg by mouth daily.     . Cholecalciferol (VITAMIN D-3) 1000 units CAPS Take 1 capsule by mouth daily.    Marland Kitchen docusate sodium (COLACE) 100 MG capsule Take 100 mg by mouth 2 (two) times daily.    . empagliflozin (JARDIANCE) 10 MG TABS tablet Take 10 mg by mouth daily. 2 tablet 0  . fluvoxaMINE (LUVOX) 100 MG tablet Take 1 tablet (100 mg total) by mouth at bedtime. 2 tablet 0  . furosemide (LASIX) 20 MG tablet Take 20 mg by mouth daily before breakfast.     . HUMALOG KWIKPEN 100 UNIT/ML KiwkPen Inject 2-11 Units into the skin See admin instructions. Times: 0630 1130 1630  Scale: 101-150: 2 units 151-200: 3 units 201-250: 5 units 251-300: 7 units 301-350: 9 units >350: 11 units    . insulin detemir (LEVEMIR) 100 UNIT/ML injection Inject 0.3 mLs (30 Units total) into the skin at bedtime. 10 mL 11  . Multiple Vitamins-Minerals (THERAGRAN-M PREMIER 50 PLUS PO) Take 1 tablet by mouth daily.    Marland Kitchen MYRBETRIQ 25 MG TB24 tablet Take 25 mg by mouth daily.     Marland Kitchen OLANZapine (ZYPREXA) 5 MG tablet Take 5 tablets (25 mg total) by mouth daily. 2 tablet 0  . omeprazole (PRILOSEC) 40 MG capsule Take 40 mg by mouth daily.     . pravastatin (PRAVACHOL) 40 MG tablet Take 40 mg by mouth daily after lunch.     . tamsulosin (FLOMAX) 0.4 MG CAPS capsule Take 0.4 mg by mouth daily.     Marland Kitchen zolpidem (AMBIEN) 5 MG tablet Take 1 tablet (5 mg total) by mouth at bedtime. 2 tablet 0  .  Calcium Citrate-Vitamin D (CITRACAL + D PO) Take 1 tablet by mouth daily.    . hydrOXYzine (ATARAX/VISTARIL) 25 MG tablet Take 1 tablet (25 mg total) by mouth at bedtime as needed (sleep). (Patient not taking: Reported on 04/15/2018) 2 tablet 0  . Vancomycin (VANCOCIN) 750-5 MG/150ML-% SOLN Inject 150 mLs (750 mg total) into the vein every 12 (twelve) hours. 4000 mL 0   No current facility-administered medications for this visit.     Medication Side Effects: Other: dry mouth  Allergies:  Allergies  Allergen Reactions  . Asa [Aspirin]     "Dr told me not to  take it"  . Codeine Other (See Comments)    DIZZINESS with Tylenol 3  . Tylenol [Acetaminophen] Other (See Comments)    Dizziness with tylenol 3    Past Medical History:  Diagnosis Date  . Abnormality of gait   . Adenomatous colon polyp   . Arthritis   . Cataract   . Dementia ([place])   . Depression   . Diabetes mellitus without complication ([place])   . Fatty liver   . Hyperlipidemia   . Hypertension   . Internal hemorrhoids   . Other malaise and fatigue   . Peripheral edema   . Schizophrenia ([place])   . Type II or unspecified type diabetes mellitus without mention of complication, not stated as uncontrolled   . Urinary frequency   . Urinary retention     Family History  Problem Relation Age of Onset  . Brain cancer Father     Social History   Socioeconomic History  . Marital status: Single    Spouse name: Not on file  . Number of children: Not on file  . Years of education: college  . Highest education level: Not on file  Occupational History    Employer: RETIRED    Comment: Disabled  Social Needs  . Financial resource strain: Not on file  . Food insecurity:    Worry: Not on file    Inability: Not on file  . Transportation needs:    Medical: Not on file    Non-medical: Not on file  Tobacco Use  . Smoking status: Never Smoker  . Smokeless tobacco: Never Used  Substance and Sexual Activity  . Alcohol use: No  . Drug use: No  . Sexual activity: Not on file  Lifestyle  . Physical activity:    Days per week: Not on file    Minutes per session: Not on file  . Stress: Not on file  Relationships  . Social connections:    Talks on phone: Not on file    Gets together: Not on file    Attends religious service: Not on file    Active member of club or organization: Not on file    Attends meetings of clubs or organizations: Not on file    Relationship status: Not on file  . Intimate partner violence:    Fear of current or ex partner: Not on file    Emotionally  abused: Not on file    Physically abused: Not on file    Forced sexual activity: Not on file  Other Topics Concern  . Not on file  Social History Narrative   Patient is disabled and he has not worked since 1962. Patient has some college education.Patient drinks 2 cups of coffee daily.   Right handed.    Past Medical History, Surgical history, Social history, and Family history were reviewed and updated as appropriate.  Please see review of systems for further details on the patient's review from today.   Objective:   Physical Exam:  There were no vitals taken for this visit.  Physical Exam Neurological:     Mental Status: He is alert.     Motor: No weakness.  Psychiatric:        Attention and Perception: He is attentive. He perceives auditory hallucinations. He does not perceive visual hallucinations.        Mood and Affect: Mood is anxious. Mood is not depressed. Affect is not angry or tearful.        Speech: Speech is not rapid and pressured or slurred.        Behavior: Behavior is slowed. Behavior is not agitated or aggressive.        Thought Content: Thought content is paranoid. Thought content does not include homicidal or suicidal ideation.        Cognition and Memory: Cognition is not impaired. Memory is not impaired. He does not exhibit impaired recent memory.     Comments: Ignores voices. Fair insight and judgment.     Lab Review:     Component Value Date/Time   NA 139 01/22/2018 0339   K 3.6 01/22/2018 0339   CL 100 01/22/2018 0339   CO2 28 01/22/2018 0339   GLUCOSE 231 (H) 01/22/2018 0339   BUN 15 01/22/2018 0339   CREATININE 0.96 01/22/2018 0339   CALCIUM 9.2 01/22/2018 0339   PROT 7.0 01/22/2018 0339   ALBUMIN 3.3 (L) 01/22/2018 0339   AST 35 01/22/2018 0339   ALT 38 01/22/2018 0339   ALKPHOS 71 01/22/2018 0339   BILITOT 0.6 01/22/2018 0339   GFRNONAA >60 01/22/2018 0339   GFRAA >60 01/22/2018 0339       Component Value Date/Time   WBC 6.8  01/22/2018 0339   RBC 4.48 01/22/2018 0339   HGB 12.9 (L) 01/22/2018 0339   HCT 41.7 01/22/2018 0339   PLT 211 01/22/2018 0339   MCV 93.1 01/22/2018 0339   MCH 28.8 01/22/2018 0339   MCHC 30.9 01/22/2018 0339   RDW 14.0 01/22/2018 0339   LYMPHSABS 1.8 01/22/2018 0339   MONOABS 0.6 01/22/2018 0339   EOSABS 0.3 01/22/2018 0339   BASOSABS 0.1 01/22/2018 0339    No results found for: POCLITH, LITHIUM   No results found for: PHENYTOIN, PHENOBARB, VALPROATE, CBMZ   .res Assessment: Plan:    Schizophrenia, paranoid ([place])  Generalized anxiety disorder  Late onset Alzheimer's disease with behavioral disturbance ([place])  Insomnia due to mental condition   Supportive therapy dealing with chronic paranoia and isolation from Covid.  Encourage his spirituality as a coping mechanism.  Needs a lot of encouragement.  Repetitively asks for reassurance.  Discussed potential metabolic side effects associated with atypical antipsychotics, as well as potential risk for movement side effects. Advised pt to contact office if movement side effects occur.   Patient has been under my psychiatric years since 1998 .unlikely that med change will reduce the paranoia which is chronic.  He has been on multiple psychiatric medications and has done best on this combination of Zyprexa which was recently increased to 25 mg March 19, a minimal dose of alprazolam, and fluvoxamine, and limited hydroxyzine which we have been trying to reduce for his insomnia  I connected with patient by a video enabled telemedicine application or telephone, with their informed consent, and verified patient privacy and that I am speaking with the correct person using two identifiers.  I was located work and patient [name].  Cognition has declined according to [employer].  MAR was reviewed from [facility].  Hydroxyzine is still on the [facility] but he has not been receiving it recently.  It is been encouraged for him to stay off  of that for cognitive reasons.  No med changes today  .25 min appt  FU 4 mos  Lynder Parents, MD, DFAPA .    Future Appointments  Date Time Provider [place]  06/19/2018 10:15 AM Gardiner Barefoot, DPM [facility]    No orders of the defined types were placed in this encounter.     -------------------------------

## 2018-04-21 DIAGNOSIS — E1169 Type 2 diabetes mellitus with other specified complication: Secondary | ICD-10-CM | POA: Diagnosis not present

## 2018-04-21 DIAGNOSIS — R1312 Dysphagia, oropharyngeal phase: Secondary | ICD-10-CM | POA: Diagnosis not present

## 2018-04-21 DIAGNOSIS — E785 Hyperlipidemia, unspecified: Secondary | ICD-10-CM | POA: Diagnosis not present

## 2018-04-21 DIAGNOSIS — I110 Hypertensive heart disease with heart failure: Secondary | ICD-10-CM | POA: Diagnosis not present

## 2018-04-22 DIAGNOSIS — E1169 Type 2 diabetes mellitus with other specified complication: Secondary | ICD-10-CM | POA: Diagnosis not present

## 2018-04-22 DIAGNOSIS — R1312 Dysphagia, oropharyngeal phase: Secondary | ICD-10-CM | POA: Diagnosis not present

## 2018-04-22 DIAGNOSIS — I110 Hypertensive heart disease with heart failure: Secondary | ICD-10-CM | POA: Diagnosis not present

## 2018-04-22 DIAGNOSIS — E785 Hyperlipidemia, unspecified: Secondary | ICD-10-CM | POA: Diagnosis not present

## 2018-04-23 DIAGNOSIS — E785 Hyperlipidemia, unspecified: Secondary | ICD-10-CM | POA: Diagnosis not present

## 2018-04-23 DIAGNOSIS — E1169 Type 2 diabetes mellitus with other specified complication: Secondary | ICD-10-CM | POA: Diagnosis not present

## 2018-04-23 DIAGNOSIS — I110 Hypertensive heart disease with heart failure: Secondary | ICD-10-CM | POA: Diagnosis not present

## 2018-04-23 DIAGNOSIS — R1312 Dysphagia, oropharyngeal phase: Secondary | ICD-10-CM | POA: Diagnosis not present

## 2018-04-24 DIAGNOSIS — R1312 Dysphagia, oropharyngeal phase: Secondary | ICD-10-CM | POA: Diagnosis not present

## 2018-04-24 DIAGNOSIS — E1169 Type 2 diabetes mellitus with other specified complication: Secondary | ICD-10-CM | POA: Diagnosis not present

## 2018-04-24 DIAGNOSIS — E785 Hyperlipidemia, unspecified: Secondary | ICD-10-CM | POA: Diagnosis not present

## 2018-04-24 DIAGNOSIS — I110 Hypertensive heart disease with heart failure: Secondary | ICD-10-CM | POA: Diagnosis not present

## 2018-04-27 DIAGNOSIS — I110 Hypertensive heart disease with heart failure: Secondary | ICD-10-CM | POA: Diagnosis not present

## 2018-04-27 DIAGNOSIS — E1169 Type 2 diabetes mellitus with other specified complication: Secondary | ICD-10-CM | POA: Diagnosis not present

## 2018-04-27 DIAGNOSIS — R1312 Dysphagia, oropharyngeal phase: Secondary | ICD-10-CM | POA: Diagnosis not present

## 2018-04-27 DIAGNOSIS — E785 Hyperlipidemia, unspecified: Secondary | ICD-10-CM | POA: Diagnosis not present

## 2018-04-28 DIAGNOSIS — E785 Hyperlipidemia, unspecified: Secondary | ICD-10-CM | POA: Diagnosis not present

## 2018-04-28 DIAGNOSIS — I110 Hypertensive heart disease with heart failure: Secondary | ICD-10-CM | POA: Diagnosis not present

## 2018-04-28 DIAGNOSIS — E1169 Type 2 diabetes mellitus with other specified complication: Secondary | ICD-10-CM | POA: Diagnosis not present

## 2018-04-28 DIAGNOSIS — R1312 Dysphagia, oropharyngeal phase: Secondary | ICD-10-CM | POA: Diagnosis not present

## 2018-04-30 DIAGNOSIS — R1312 Dysphagia, oropharyngeal phase: Secondary | ICD-10-CM | POA: Diagnosis not present

## 2018-04-30 DIAGNOSIS — I110 Hypertensive heart disease with heart failure: Secondary | ICD-10-CM | POA: Diagnosis not present

## 2018-04-30 DIAGNOSIS — E785 Hyperlipidemia, unspecified: Secondary | ICD-10-CM | POA: Diagnosis not present

## 2018-04-30 DIAGNOSIS — E1169 Type 2 diabetes mellitus with other specified complication: Secondary | ICD-10-CM | POA: Diagnosis not present

## 2018-05-01 DIAGNOSIS — I110 Hypertensive heart disease with heart failure: Secondary | ICD-10-CM | POA: Diagnosis not present

## 2018-05-01 DIAGNOSIS — E1169 Type 2 diabetes mellitus with other specified complication: Secondary | ICD-10-CM | POA: Diagnosis not present

## 2018-05-01 DIAGNOSIS — E785 Hyperlipidemia, unspecified: Secondary | ICD-10-CM | POA: Diagnosis not present

## 2018-05-01 DIAGNOSIS — R1312 Dysphagia, oropharyngeal phase: Secondary | ICD-10-CM | POA: Diagnosis not present

## 2018-05-04 DIAGNOSIS — E1169 Type 2 diabetes mellitus with other specified complication: Secondary | ICD-10-CM | POA: Diagnosis not present

## 2018-05-04 DIAGNOSIS — E785 Hyperlipidemia, unspecified: Secondary | ICD-10-CM | POA: Diagnosis not present

## 2018-05-04 DIAGNOSIS — I110 Hypertensive heart disease with heart failure: Secondary | ICD-10-CM | POA: Diagnosis not present

## 2018-05-04 DIAGNOSIS — R1312 Dysphagia, oropharyngeal phase: Secondary | ICD-10-CM | POA: Diagnosis not present

## 2018-05-05 DIAGNOSIS — E785 Hyperlipidemia, unspecified: Secondary | ICD-10-CM | POA: Diagnosis not present

## 2018-05-05 DIAGNOSIS — I110 Hypertensive heart disease with heart failure: Secondary | ICD-10-CM | POA: Diagnosis not present

## 2018-05-05 DIAGNOSIS — E1169 Type 2 diabetes mellitus with other specified complication: Secondary | ICD-10-CM | POA: Diagnosis not present

## 2018-05-05 DIAGNOSIS — R1312 Dysphagia, oropharyngeal phase: Secondary | ICD-10-CM | POA: Diagnosis not present

## 2018-05-06 ENCOUNTER — Telehealth: Payer: Self-pay | Admitting: Psychiatry

## 2018-05-06 DIAGNOSIS — E785 Hyperlipidemia, unspecified: Secondary | ICD-10-CM | POA: Diagnosis not present

## 2018-05-06 DIAGNOSIS — E1169 Type 2 diabetes mellitus with other specified complication: Secondary | ICD-10-CM | POA: Diagnosis not present

## 2018-05-06 DIAGNOSIS — R1312 Dysphagia, oropharyngeal phase: Secondary | ICD-10-CM | POA: Diagnosis not present

## 2018-05-06 DIAGNOSIS — I110 Hypertensive heart disease with heart failure: Secondary | ICD-10-CM | POA: Diagnosis not present

## 2018-05-06 NOTE — Telephone Encounter (Signed)
Ricky [name] is calling from [name] stated he is not thinking clearly. He is not sleeping very well either. Pleased with living conditions, and couldn't ask for a better place to live,but he just isn't thinking clearly. Please advise.

## 2018-05-07 DIAGNOSIS — E1169 Type 2 diabetes mellitus with other specified complication: Secondary | ICD-10-CM | POA: Diagnosis not present

## 2018-05-07 DIAGNOSIS — E785 Hyperlipidemia, unspecified: Secondary | ICD-10-CM | POA: Diagnosis not present

## 2018-05-07 DIAGNOSIS — R1312 Dysphagia, oropharyngeal phase: Secondary | ICD-10-CM | POA: Diagnosis not present

## 2018-05-07 DIAGNOSIS — I110 Hypertensive heart disease with heart failure: Secondary | ICD-10-CM | POA: Diagnosis not present

## 2018-05-08 DIAGNOSIS — E785 Hyperlipidemia, unspecified: Secondary | ICD-10-CM | POA: Diagnosis not present

## 2018-05-08 DIAGNOSIS — I110 Hypertensive heart disease with heart failure: Secondary | ICD-10-CM | POA: Diagnosis not present

## 2018-05-08 DIAGNOSIS — R1312 Dysphagia, oropharyngeal phase: Secondary | ICD-10-CM | POA: Diagnosis not present

## 2018-05-08 DIAGNOSIS — E1169 Type 2 diabetes mellitus with other specified complication: Secondary | ICD-10-CM | POA: Diagnosis not present

## 2018-05-11 DIAGNOSIS — R1312 Dysphagia, oropharyngeal phase: Secondary | ICD-10-CM | POA: Diagnosis not present

## 2018-05-11 DIAGNOSIS — I110 Hypertensive heart disease with heart failure: Secondary | ICD-10-CM | POA: Diagnosis not present

## 2018-05-11 DIAGNOSIS — E1169 Type 2 diabetes mellitus with other specified complication: Secondary | ICD-10-CM | POA: Diagnosis not present

## 2018-05-11 DIAGNOSIS — E785 Hyperlipidemia, unspecified: Secondary | ICD-10-CM | POA: Diagnosis not present

## 2018-05-12 DIAGNOSIS — E1169 Type 2 diabetes mellitus with other specified complication: Secondary | ICD-10-CM | POA: Diagnosis not present

## 2018-05-12 DIAGNOSIS — I110 Hypertensive heart disease with heart failure: Secondary | ICD-10-CM | POA: Diagnosis not present

## 2018-05-12 DIAGNOSIS — E785 Hyperlipidemia, unspecified: Secondary | ICD-10-CM | POA: Diagnosis not present

## 2018-05-12 DIAGNOSIS — R1312 Dysphagia, oropharyngeal phase: Secondary | ICD-10-CM | POA: Diagnosis not present

## 2018-05-12 NOTE — Telephone Encounter (Signed)
No .  This is one of his repetitive excessive phone calls.  No new issues.  No call necessary.

## 2018-05-12 NOTE — Telephone Encounter (Signed)
Do I need to call him?

## 2018-05-13 DIAGNOSIS — R1312 Dysphagia, oropharyngeal phase: Secondary | ICD-10-CM | POA: Diagnosis not present

## 2018-05-13 DIAGNOSIS — E785 Hyperlipidemia, unspecified: Secondary | ICD-10-CM | POA: Diagnosis not present

## 2018-05-13 DIAGNOSIS — I110 Hypertensive heart disease with heart failure: Secondary | ICD-10-CM | POA: Diagnosis not present

## 2018-05-13 DIAGNOSIS — E1169 Type 2 diabetes mellitus with other specified complication: Secondary | ICD-10-CM | POA: Diagnosis not present

## 2018-05-14 ENCOUNTER — Other Ambulatory Visit: Payer: Self-pay

## 2018-05-14 NOTE — Patient Outreach (Signed)
Ricky [name] [facility]) Care Management  05/14/2018  Ricky [name] 1940/08/17 [id number]   Medication Adherence call to Ricky [name] HIPPA Compliant Voice message left with a call back number. Ricky [name] is showing past due on Pravastatin 40 mg under [place].    [place] Management Direct Dial [phone]  Fax [phone] [email or web address]

## 2018-05-25 DIAGNOSIS — K5909 Other constipation: Secondary | ICD-10-CM | POA: Diagnosis not present

## 2018-05-26 DIAGNOSIS — R319 Hematuria, unspecified: Secondary | ICD-10-CM | POA: Diagnosis not present

## 2018-05-26 DIAGNOSIS — N390 Urinary tract infection, site not specified: Secondary | ICD-10-CM | POA: Diagnosis not present

## 2018-05-27 ENCOUNTER — Encounter: Payer: Self-pay | Admitting: Psychiatry

## 2018-05-27 ENCOUNTER — Ambulatory Visit (INDEPENDENT_AMBULATORY_CARE_PROVIDER_SITE_OTHER): Payer: Medicare Other | Admitting: Psychiatry

## 2018-05-27 ENCOUNTER — Other Ambulatory Visit: Payer: Self-pay

## 2018-05-27 DIAGNOSIS — E1122 Type 2 diabetes mellitus with diabetic chronic kidney disease: Secondary | ICD-10-CM | POA: Diagnosis not present

## 2018-05-27 DIAGNOSIS — F0281 Dementia in other diseases classified elsewhere with behavioral disturbance: Secondary | ICD-10-CM

## 2018-05-27 DIAGNOSIS — G301 Alzheimer's disease with late onset: Secondary | ICD-10-CM | POA: Diagnosis not present

## 2018-05-27 DIAGNOSIS — F411 Generalized anxiety disorder: Secondary | ICD-10-CM | POA: Diagnosis not present

## 2018-05-27 DIAGNOSIS — F200 Paranoid schizophrenia: Secondary | ICD-10-CM | POA: Diagnosis not present

## 2018-05-27 DIAGNOSIS — F5105 Insomnia due to other mental disorder: Secondary | ICD-10-CM

## 2018-05-27 DIAGNOSIS — F02818 Dementia in other diseases classified elsewhere, unspecified severity, with other behavioral disturbance: Secondary | ICD-10-CM

## 2018-05-27 NOTE — Progress Notes (Signed)
Ricky [name] [id number] 1940-04-04 78 y.o.   Virtual Visit via Telephone Note  I connected with pt by telephone and verified that I am speaking with the correct person using two identifiers.   I discussed the limitations, risks, security and privacy concerns of performing an evaluation and management service by telephone and the availability of in person appointments. I also discussed with the patient that there may be a patient responsible charge related to this service. The patient expressed understanding and agreed to proceed.  I discussed the assessment and treatment plan with the patient. The patient was provided an opportunity to ask questions and all were answered. The patient agreed with the plan and demonstrated an understanding of the instructions.   The patient was advised to call back or seek an in-person evaluation if the symptoms worsen or if the condition fails to improve as anticipated.  I provided 25 minutes of non-face-to-face time during this encounter. The call started at 220 and ended at 2. The patient was located at [facility] and the provider was located office.   Subjective:   Patient ID:  Ricky [name]. is a 78 y.o. (DOB Jul 03, 1940) male.  Chief Complaint:  Chief Complaint  Patient presents with  . Follow-up    Medication Management  . Paranoid    worse per staff  . Altered Mental Status    recently diagnosed UTI  . Schizophrenia    HPI Ricky [name]. presents to the office today for follow-up of schizophrenia and dementia.  Resident of [facility].  Last seen July.  NH shut down travel unless emergency.  Nursing staff says more confused and paranoid this week and has recently diagnosed severe UTI.  Has not started Abx yet, but will be ordered.  "So many things have happened".  U incontinence and some pain and has had to lie in bed for a couple of days.  At times feels mixed up in his mind  Still thinks God is mad at him and  others also.  Able to read the Bible right now but sometimes cause paranoia.  Upset friend [name] can't visit.  STM problems.  Doesn't want to change psychiatrists as they had pushed over having a NH psychiatrist take over his care.  Stressed by [employer] and not able to get out with [place].  Not depressed.. Struggles with self identity and anxiety.  Still gets confused spiritually.  Goes to bed 830.  Doesn't bother him if he lays awake at night now.  [facility] has called with occasional behavioral disturbances.  Past Psychiatric Medication Trials:  All of them are unknown, haloperidol alone, serentil,, benztropine, Xanax, Stelazine, Moban, perphenazine, paroxetine, risperidone, Thorazine, Seroquel 800 mg, sertraline side effects, loxapine, trazodone no response he has been on Zyprexa since September 2006 and that has produced the best control of his paranoia.,  Hydroxyzine   Review of Systems:  Review of Systems  Genitourinary: Positive for enuresis.  Musculoskeletal: Positive for arthralgias, back pain and gait problem.  Neurological: Positive for dizziness, tremors and weakness.  Psychiatric/Behavioral: Positive for confusion, decreased concentration, hallucinations and sleep disturbance. Negative for self-injury and suicidal ideas. The patient is nervous/anxious.     Medications: I have reviewed the patient's current medications.  Current Outpatient Medications  Medication Sig Dispense Refill  . ALPRAZolam (XANAX) 0.5 MG tablet Take 1 tablet (0.5 mg total) by mouth at bedtime. 2 tablet 0  . amLODipine (NORVASC) 5 MG tablet Take 5 mg by mouth daily.    Marland Kitchen  Calcium Citrate-Vitamin D (CITRACAL + D PO) Take 1 tablet by mouth daily.    . Cholecalciferol (VITAMIN D-3) 1000 units CAPS Take 1 capsule by mouth daily.    Marland Kitchen docusate sodium (COLACE) 100 MG capsule Take 100 mg by mouth 2 (two) times daily.    . empagliflozin (JARDIANCE) 10 MG TABS tablet Take 10 mg by mouth daily. 2 tablet 0  .  fluvoxaMINE (LUVOX) 100 MG tablet Take 1 tablet (100 mg total) by mouth at bedtime. 2 tablet 0  . furosemide (LASIX) 20 MG tablet Take 20 mg by mouth daily before breakfast.     . HUMALOG KWIKPEN 100 UNIT/ML KiwkPen Inject 2-11 Units into the skin See admin instructions. Times: 0630 1130 1630  Scale: 101-150: 2 units 151-200: 3 units 201-250: 5 units 251-300: 7 units 301-350: 9 units >350: 11 units    . hydrOXYzine (ATARAX/VISTARIL) 25 MG tablet Take 1 tablet (25 mg total) by mouth at bedtime as needed (sleep). 2 tablet 0  . insulin detemir (LEVEMIR) 100 UNIT/ML injection Inject 0.3 mLs (30 Units total) into the skin at bedtime. 10 mL 11  . Multiple Vitamins-Minerals (THERAGRAN-M PREMIER 50 PLUS PO) Take 1 tablet by mouth daily.    Marland Kitchen MYRBETRIQ 25 MG TB24 tablet Take 25 mg by mouth daily.     Marland Kitchen nystatin cream (MYCOSTATIN) Apply 1 application topically 2 (two) times daily.    Marland Kitchen OLANZapine (ZYPREXA) 5 MG tablet Take 5 tablets (25 mg total) by mouth daily. 2 tablet 0  . omeprazole (PRILOSEC) 40 MG capsule Take 40 mg by mouth daily.     . pravastatin (PRAVACHOL) 40 MG tablet Take 40 mg by mouth daily after lunch.     . tamsulosin (FLOMAX) 0.4 MG CAPS capsule Take 0.4 mg by mouth daily.     Marland Kitchen zolpidem (AMBIEN) 5 MG tablet Take 1 tablet (5 mg total) by mouth at bedtime. 2 tablet 0   No current facility-administered medications for this visit.     Medication Side Effects: Other: dry mouth  Allergies:  Allergies  Allergen Reactions  . Asa [Aspirin]     "Dr told me not to take it"  . Codeine Other (See Comments)    DIZZINESS with Tylenol 3  . Tylenol [Acetaminophen] Other (See Comments)    Dizziness with tylenol 3    Past Medical History:  Diagnosis Date  . Abnormality of gait   . Adenomatous colon polyp   . Arthritis   . Cataract   . Dementia ([place])   . Depression   . Diabetes mellitus without complication ([place])   . Fatty liver   . Hyperlipidemia   . Hypertension   .  Internal hemorrhoids   . Other malaise and fatigue   . Peripheral edema   . Schizophrenia ([place])   . Type II or unspecified type diabetes mellitus without mention of complication, not stated as uncontrolled   . Urinary frequency   . Urinary retention     Family History  Problem Relation Age of Onset  . Brain cancer Father     Social History   Socioeconomic History  . Marital status: Single    Spouse name: Not on file  . Number of children: Not on file  . Years of education: college  . Highest education level: Not on file  Occupational History    Employer: RETIRED    Comment: Disabled  Social Needs  . Financial resource strain: Not on file  . Food insecurity:  Worry: Not on file    Inability: Not on file  . Transportation needs:    Medical: Not on file    Non-medical: Not on file  Tobacco Use  . Smoking status: Never Smoker  . Smokeless tobacco: Never Used  Substance and Sexual Activity  . Alcohol use: No  . Drug use: No  . Sexual activity: Not on file  Lifestyle  . Physical activity:    Days per week: Not on file    Minutes per session: Not on file  . Stress: Not on file  Relationships  . Social connections:    Talks on phone: Not on file    Gets together: Not on file    Attends religious service: Not on file    Active member of club or organization: Not on file    Attends meetings of clubs or organizations: Not on file    Relationship status: Not on file  . Intimate partner violence:    Fear of current or ex partner: Not on file    Emotionally abused: Not on file    Physically abused: Not on file    Forced sexual activity: Not on file  Other Topics Concern  . Not on file  Social History Narrative   Patient is disabled and he has not worked since 1962. Patient has some college education.Patient drinks 2 cups of coffee daily.   Right handed.    Past Medical History, Surgical history, Social history, and Family history were reviewed and updated as  appropriate.   Please see review of systems for further details on the patient's review from today.   Objective:   Physical Exam:  There were no vitals taken for this visit.  Physical Exam Neurological:     Mental Status: He is alert and oriented to person, place, and time.     Cranial Nerves: Dysarthria present.     Comments: Mild dysarthria chronic April 6 ...then with clues Wed and 2020  Psychiatric:        Attention and Perception: Attention normal. He is attentive. He perceives auditory hallucinations. He does not perceive visual hallucinations.        Mood and Affect: Mood is anxious. Mood is not depressed. Affect is not angry or tearful.        Speech: Speech normal. Speech is not rapid and pressured or slurred.        Behavior: Behavior is slowed. Behavior is not agitated or aggressive. Behavior is cooperative.        Thought Content: Thought content is paranoid. Thought content is not delusional. Thought content does not include homicidal or suicidal ideation. Thought content does not include homicidal or suicidal plan.        Cognition and Memory: Cognition is impaired. Memory is impaired. He does not exhibit impaired recent memory.     Comments: Ignores voices. Fair insight and judgment. Talkative and repeats himself some. Needy for reassurance as usual but has trouble articulating what he needs exactly.     Lab Review:     Component Value Date/Time   NA 139 01/22/2018 0339   K 3.6 01/22/2018 0339   CL 100 01/22/2018 0339   CO2 28 01/22/2018 0339   GLUCOSE 231 (H) 01/22/2018 0339   BUN 15 01/22/2018 0339   CREATININE 0.96 01/22/2018 0339   CALCIUM 9.2 01/22/2018 0339   PROT 7.0 01/22/2018 0339   ALBUMIN 3.3 (L) 01/22/2018 0339   AST 35 01/22/2018 0339   ALT 38 01/22/2018  0339   ALKPHOS 71 01/22/2018 0339   BILITOT 0.6 01/22/2018 0339   GFRNONAA >60 01/22/2018 0339   GFRAA >60 01/22/2018 0339       Component Value Date/Time   WBC 6.8 01/22/2018 0339    RBC 4.48 01/22/2018 0339   HGB 12.9 (L) 01/22/2018 0339   HCT 41.7 01/22/2018 0339   PLT 211 01/22/2018 0339   MCV 93.1 01/22/2018 0339   MCH 28.8 01/22/2018 0339   MCHC 30.9 01/22/2018 0339   RDW 14.0 01/22/2018 0339   LYMPHSABS 1.8 01/22/2018 0339   MONOABS 0.6 01/22/2018 0339   EOSABS 0.3 01/22/2018 0339   BASOSABS 0.1 01/22/2018 0339    No results found for: POCLITH, LITHIUM   No results found for: PHENYTOIN, PHENOBARB, VALPROATE, CBMZ   .res Assessment: Plan:    Schizophrenia, paranoid ([place])  Generalized anxiety disorder  Late onset Alzheimer's disease with behavioral disturbance ([place])  Insomnia due to mental condition   Patient has been under my psychiatric years since 1998 .unlikely that med change will reduce the paranoia which is chronic.  He has been on multiple psychiatric medications and has done best on this combination of Zyprexa which was recently increased to 25 mg March 19, a minimal dose of alprazolam, and fluvoxamine, and limited hydroxyzine which we have been trying to reduce for his insomnia.  Chronic paranoia without change.  Is cooperative genereally.  Supportive therapy dealing with chronic paranoia and isolation from Covid.  Encourage his spirituality as a coping mechanism.  Needs a lot of encouragement.  Repetitively asks for reassurance. Needs reassurance he does not need to go to the hospital.  Discussed potential metabolic side effects associated with atypical antipsychotics, as well as potential risk for movement side effects. Advised pt to contact office if movement side effects occur.   I connected with patient by a video enabled telemedicine application or telephone, with their informed consent, and verified patient privacy and that I am speaking with the correct person using two identifiers.  I was located work and patient [name].  Cognition has declined according to [employer].  MAR was reviewed from [facility].  Hydroxyzine is  still on the [facility] but he has not been receiving it recently.  It is been encouraged for him to stay off of that for cognitive reasons.  No med changes today  .25 min appt  FU 4 mos  Lynder Parents, MD, DFAPA .    Future Appointments  Date Time Provider [place]  06/19/2018 10:15 AM Gardiner Barefoot, DPM [facility]    No orders of the defined types were placed in this encounter.     -------------------------------

## 2018-06-02 DIAGNOSIS — M25562 Pain in left knee: Secondary | ICD-10-CM | POA: Diagnosis not present

## 2018-06-02 DIAGNOSIS — I10 Essential (primary) hypertension: Secondary | ICD-10-CM | POA: Diagnosis not present

## 2018-06-02 DIAGNOSIS — E11621 Type 2 diabetes mellitus with foot ulcer: Secondary | ICD-10-CM | POA: Diagnosis not present

## 2018-06-02 DIAGNOSIS — M25561 Pain in right knee: Secondary | ICD-10-CM | POA: Diagnosis not present

## 2018-06-03 DIAGNOSIS — M2550 Pain in unspecified joint: Secondary | ICD-10-CM | POA: Diagnosis not present

## 2018-06-03 DIAGNOSIS — E785 Hyperlipidemia, unspecified: Secondary | ICD-10-CM | POA: Diagnosis not present

## 2018-06-03 DIAGNOSIS — Z79899 Other long term (current) drug therapy: Secondary | ICD-10-CM | POA: Diagnosis not present

## 2018-06-04 DIAGNOSIS — M25561 Pain in right knee: Secondary | ICD-10-CM | POA: Diagnosis not present

## 2018-06-04 DIAGNOSIS — I10 Essential (primary) hypertension: Secondary | ICD-10-CM | POA: Diagnosis not present

## 2018-06-04 DIAGNOSIS — E11621 Type 2 diabetes mellitus with foot ulcer: Secondary | ICD-10-CM | POA: Diagnosis not present

## 2018-06-09 DIAGNOSIS — M25561 Pain in right knee: Secondary | ICD-10-CM | POA: Diagnosis not present

## 2018-06-09 DIAGNOSIS — E11621 Type 2 diabetes mellitus with foot ulcer: Secondary | ICD-10-CM | POA: Diagnosis not present

## 2018-06-09 DIAGNOSIS — I10 Essential (primary) hypertension: Secondary | ICD-10-CM | POA: Diagnosis not present

## 2018-06-19 ENCOUNTER — Encounter: Payer: Self-pay | Admitting: Podiatry

## 2018-06-19 ENCOUNTER — Other Ambulatory Visit: Payer: Self-pay

## 2018-06-19 ENCOUNTER — Ambulatory Visit: Payer: Medicare Other | Admitting: Podiatry

## 2018-06-19 VITALS — Temp 97.7°F

## 2018-06-19 DIAGNOSIS — E1159 Type 2 diabetes mellitus with other circulatory complications: Secondary | ICD-10-CM

## 2018-06-19 DIAGNOSIS — B351 Tinea unguium: Secondary | ICD-10-CM | POA: Diagnosis not present

## 2018-06-19 DIAGNOSIS — M79609 Pain in unspecified limb: Secondary | ICD-10-CM

## 2018-06-19 DIAGNOSIS — M79676 Pain in unspecified toe(s): Secondary | ICD-10-CM

## 2018-06-19 NOTE — Progress Notes (Addendum)
Complaint:  Visit Type: Patient returns to my office for continued preventative foot care services. Complaint: Patient states" my nails have grown long and thick and become painful to walk and wear shoes" Patient has been diagnosed with DM with vascular disease . The patient presents for preventative foot care services.    Patient has callus right hallux with no bandage.   He presents to the office for preventative foot care services.   No changes to ROS.      Podiatric Exam: Vascular: dorsalis pedis and posterior tibial pulses are weakly  palpable bilateral. Capillary return is immediate. Cold feet noted. Thin shiny skin noted.  Swelling noted both feet. Sensorium: Diminished  Semmes Weinstein monofilament test. Diminished  tactile sensation bilaterally. Nail Exam: Pt has thick disfigured discolored nails with subungual debris noted entire nail hallux through fifth toenails right foot and multiple nails left foot. Ulcer Exam: Healed ulcer right hallux.  No infection. Orthopedic Exam: Muscle tone and strength are WNL. No limitations in general ROM. No crepitus or effusions noted. Foot type and digits show no abnormalities. Bony prominences are unremarkable. Skin: No Porokeratosis. No infection or ulcers  Diagnosis:  Onychomycosis, , Pain in right toe, pain in left toes,  Healed ulcer right hallux.  Treatment & Plan Procedures and Treatment: Consent by patient was obtained for treatment procedures.   Debridement of mycotic and hypertrophic toenails, 1 through 5 bilateral and clearing of subungual debris.    RTC 10 weeks for nail care.       Return Visit-Office Procedure: Patient instructed to return to the office for a follow up visit 10 weeks  for continued evaluation and treatment.    Gardiner Barefoot DPM

## 2018-06-22 ENCOUNTER — Telehealth: Payer: Self-pay | Admitting: Psychiatry

## 2018-06-22 NOTE — Telephone Encounter (Signed)
Ricky [name] called 3 times over the weekend.  Says he's not doing very well.  Having some hard times.  Incontinent.  Doesn't want to go to hospital.  He didn't sound in critical.  Perhaps just needs a call for reassurance.  No appt. Scheduled.

## 2018-06-22 NOTE — Telephone Encounter (Signed)
Pt. Was worried a little about his religion. I assured him that God is forgiving and all he has to do is ask for forgiveness. This seemed to put him at ease a lot. He says he is 5 but doesn't know how long he will have left but hopes he can make it to football season and basketball. He loves sports. He just needed some reassuring that things would be okay. He said he does get emotional at times but overall believes he is doing well. He wanted to pray at the end of the conversation so I listened as he did and he felt much better. I don't think any follow up is needed.

## 2018-07-21 ENCOUNTER — Other Ambulatory Visit: Payer: Self-pay

## 2018-07-21 NOTE — Patient Outreach (Signed)
Left message regarding [facility] care mgmt program.

## 2018-07-29 DIAGNOSIS — E1122 Type 2 diabetes mellitus with diabetic chronic kidney disease: Secondary | ICD-10-CM | POA: Diagnosis not present

## 2018-08-13 DIAGNOSIS — E785 Hyperlipidemia, unspecified: Secondary | ICD-10-CM | POA: Diagnosis not present

## 2018-08-13 DIAGNOSIS — I10 Essential (primary) hypertension: Secondary | ICD-10-CM | POA: Diagnosis not present

## 2018-08-13 DIAGNOSIS — E1169 Type 2 diabetes mellitus with other specified complication: Secondary | ICD-10-CM | POA: Diagnosis not present

## 2018-08-14 ENCOUNTER — Other Ambulatory Visit: Payer: Self-pay

## 2018-08-14 ENCOUNTER — Ambulatory Visit (INDEPENDENT_AMBULATORY_CARE_PROVIDER_SITE_OTHER): Payer: Medicare Other | Admitting: Psychiatry

## 2018-08-14 ENCOUNTER — Encounter: Payer: Self-pay | Admitting: Psychiatry

## 2018-08-14 DIAGNOSIS — F411 Generalized anxiety disorder: Secondary | ICD-10-CM

## 2018-08-14 DIAGNOSIS — F02818 Dementia in other diseases classified elsewhere, unspecified severity, with other behavioral disturbance: Secondary | ICD-10-CM

## 2018-08-14 DIAGNOSIS — F422 Mixed obsessional thoughts and acts: Secondary | ICD-10-CM

## 2018-08-14 DIAGNOSIS — F5105 Insomnia due to other mental disorder: Secondary | ICD-10-CM

## 2018-08-14 DIAGNOSIS — G301 Alzheimer's disease with late onset: Secondary | ICD-10-CM | POA: Diagnosis not present

## 2018-08-14 DIAGNOSIS — F200 Paranoid schizophrenia: Secondary | ICD-10-CM

## 2018-08-14 DIAGNOSIS — F0281 Dementia in other diseases classified elsewhere with behavioral disturbance: Secondary | ICD-10-CM

## 2018-08-14 NOTE — Progress Notes (Signed)
Ricky [name] [id number] 05/10/1940 78 y.o.   Virtual Visit via Telephone Note  I connected with pt by telephone and verified that I am speaking with the correct person using two identifiers.   I discussed the limitations, risks, security and privacy concerns of performing an evaluation and management service by telephone and the availability of in person appointments. I also discussed with the patient that there may be a patient responsible charge related to this service. The patient expressed understanding and agreed to proceed.  I discussed the assessment and treatment plan with the patient. The patient was provided an opportunity to ask questions and all were answered. The patient agreed with the plan and demonstrated an understanding of the instructions.   The patient was advised to call back or seek an in-person evaluation if the symptoms worsen or if the condition fails to improve as anticipated.  I provided 25 minutes of non-face-to-face time during this encounter. The call started at 130 and ended at 200. The patient was located at [facility] and the provider was located office.   Subjective:   Patient ID:  Ricky [name]. is a 78 y.o. (DOB [birth year]) male.  Chief Complaint:  Chief Complaint  Patient presents with  . Follow-up    Medication Management  . Depression    Medication Management  . Schizophrenia    Medication Management  . Other    OCD    Depression        Associated symptoms include decreased concentration and fatigue.  Associated symptoms include no suicidal ideas.  Ricky [name]. presents to the office today for follow-up of schizophrenia and dementia.  Resident of [facility].  Last seen May 2020.  No meds were changed  CO several physical problems including his WC.  Weaker. Harder time getting here DT weakness.  Spends a lot of time in bed.  Intrusive thoughts of needing to go to the hospital DT his mental health  problems.  Nursing staff says more confused and paranoid this week and has recently diagnosed severe UTI.  Has not started Abx yet, but will be ordered.  Rates himself in 99% range and asks others to rate how he's doing also.  Still thinks God is mad at him and others also.  Able to read the Bible right now but sometimes cause paranoia.  Upset friend [name] can't visit.  STM problems.  Doesn't want to change psychiatrists as they had pushed over having a NH psychiatrist take over his care.  Stressed by [employer] and not able to get out with [place].  Not depressed.. Struggles with self identity and anxiety.  Still gets confused spiritually and some degree of paranoia.  Goes to bed 830.  Doesn't bother him if he lays awake at night now.  [facility] has called with occasional behavioral disturbances.  Eats in his room.  No complaints voiced about staff or residents except one.  Past Psychiatric Medication Trials:  All of them are unknown, haloperidol alone, serentil,, benztropine, Xanax, Stelazine, Moban, perphenazine, paroxetine, risperidone, Thorazine, Seroquel 800 mg, sertraline side effects, loxapine, trazodone no response he has been on Zyprexa since September 2006 and that has produced the best control of his paranoia.,  Hydroxyzine  Review of Systems:  Review of Systems  Constitutional: Positive for fatigue.  Genitourinary: Positive for enuresis.  Musculoskeletal: Positive for arthralgias, back pain and gait problem.  Neurological: Positive for dizziness, tremors and weakness.  Psychiatric/Behavioral: Positive for confusion, decreased concentration, depression, hallucinations  and sleep disturbance. Negative for self-injury and suicidal ideas. The patient is nervous/anxious.     Medications: I have reviewed the patient's current medications.  Current Outpatient Medications  Medication Sig Dispense Refill  . ALPRAZolam (XANAX) 0.5 MG tablet Take 1 tablet (0.5 mg total) by mouth at bedtime. 2  tablet 0  . amLODipine (NORVASC) 5 MG tablet Take 5 mg by mouth daily.    . Calcium Citrate-Vitamin D (CITRACAL + D PO) Take 1 tablet by mouth daily.    . Cholecalciferol (VITAMIN D-3) 1000 units CAPS Take 1 capsule by mouth daily.    Marland Kitchen docusate sodium (COLACE) 100 MG capsule Take 100 mg by mouth 2 (two) times daily.    . empagliflozin (JARDIANCE) 10 MG TABS tablet Take 10 mg by mouth daily. 2 tablet 0  . fluvoxaMINE (LUVOX) 100 MG tablet Take 1 tablet (100 mg total) by mouth at bedtime. 2 tablet 0  . furosemide (LASIX) 20 MG tablet Take 20 mg by mouth daily before breakfast.     . HUMALOG KWIKPEN 100 UNIT/ML KiwkPen Inject 2-11 Units into the skin See admin instructions. Times: 0630 1130 1630  Scale: 101-150: 2 units 151-200: 3 units 201-250: 5 units 251-300: 7 units 301-350: 9 units >350: 11 units    . hydrOXYzine (ATARAX/VISTARIL) 25 MG tablet Take 1 tablet (25 mg total) by mouth at bedtime as needed (sleep). 2 tablet 0  . insulin detemir (LEVEMIR) 100 UNIT/ML injection Inject 0.3 mLs (30 Units total) into the skin at bedtime. (Patient taking differently: Inject 24 Units into the skin at bedtime. ) 10 mL 11  . Multiple Vitamins-Minerals (THERAGRAN-M PREMIER 50 PLUS PO) Take 1 tablet by mouth daily.    Marland Kitchen MYRBETRIQ 25 MG TB24 tablet Take 25 mg by mouth daily.     Marland Kitchen OLANZapine (ZYPREXA) 5 MG tablet Take 5 tablets (25 mg total) by mouth daily. 2 tablet 0  . omeprazole (PRILOSEC) 40 MG capsule Take 40 mg by mouth daily.     . pravastatin (PRAVACHOL) 40 MG tablet Take 40 mg by mouth daily after lunch.     . tamsulosin (FLOMAX) 0.4 MG CAPS capsule Take 0.4 mg by mouth daily.     Marland Kitchen zolpidem (AMBIEN) 5 MG tablet Take 1 tablet (5 mg total) by mouth at bedtime. 2 tablet 0   No current facility-administered medications for this visit.     Medication Side Effects: Other: dry mouth  Allergies:  Allergies  Allergen Reactions  . Asa [Aspirin]     "Dr told me not to take it"  . Codeine  Other (See Comments)    DIZZINESS with Tylenol 3  . Tylenol [Acetaminophen] Other (See Comments)    Dizziness with tylenol 3    Past Medical History:  Diagnosis Date  . Abnormality of gait   . Adenomatous colon polyp   . Arthritis   . Cataract   . Dementia ([place])   . Depression   . Diabetes mellitus without complication ([place])   . Fatty liver   . Hyperlipidemia   . Hypertension   . Internal hemorrhoids   . Other malaise and fatigue   . Peripheral edema   . Schizophrenia ([place])   . Type II or unspecified type diabetes mellitus without mention of complication, not stated as uncontrolled   . Urinary frequency   . Urinary retention     Family History  Problem Relation Age of Onset  . Brain cancer Father     Social History  Socioeconomic History  . Marital status: Single    Spouse name: Not on file  . Number of children: Not on file  . Years of education: college  . Highest education level: Not on file  Occupational History    Employer: RETIRED    Comment: Disabled  Social Needs  . Financial resource strain: Not on file  . Food insecurity    Worry: Not on file    Inability: Not on file  . Transportation needs    Medical: Not on file    Non-medical: Not on file  Tobacco Use  . Smoking status: Never Smoker  . Smokeless tobacco: Never Used  Substance and Sexual Activity  . Alcohol use: No  . Drug use: No  . Sexual activity: Not on file  Lifestyle  . Physical activity    Days per week: Not on file    Minutes per session: Not on file  . Stress: Not on file  Relationships  . Social Herbalist on phone: Not on file    Gets together: Not on file    Attends religious service: Not on file    Active member of club or organization: Not on file    Attends meetings of clubs or organizations: Not on file    Relationship status: Not on file  . Intimate partner violence    Fear of current or ex partner: Not on file    Emotionally abused: Not on file     Physically abused: Not on file    Forced sexual activity: Not on file  Other Topics Concern  . Not on file  Social History Narrative   Patient is disabled and he has not worked since 1962. Patient has some college education.Patient drinks 2 cups of coffee daily.   Right handed.    Past Medical History, Surgical history, Social history, and Family history were reviewed and updated as appropriate.   Please see review of systems for further details on the patient's review from today.   Objective:   Physical Exam:  There were no vitals taken for this visit.  Physical Exam Neurological:     Mental Status: He is alert and oriented to person, place, and time.     Cranial Nerves: Dysarthria present.     Comments: Mild dysarthria chronic   Psychiatric:        Attention and Perception: Attention normal. He is attentive. He perceives auditory hallucinations. He does not perceive visual hallucinations.        Mood and Affect: Mood is anxious. Mood is not depressed. Affect is not angry or tearful.        Speech: Speech normal. Speech is not rapid and pressured or slurred.        Behavior: Behavior is slowed. Behavior is not agitated or aggressive. Behavior is cooperative.        Thought Content: Thought content is paranoid and delusional. Thought content does not include homicidal or suicidal ideation. Thought content does not include homicidal or suicidal plan.        Cognition and Memory: Cognition is impaired. Memory is impaired. He does not exhibit impaired recent memory.     Comments: Ignores voices. Fair insight and judgment. Talkative and repeats himself some. Needy for reassurance as usual but has trouble articulating what he needs exactly. Some IOR re commericials on TV. Obsessive thought and compulsive behaviors are present but not disabling.     Lab Review:     Component Value  Date/Time   NA 139 01/22/2018 0339   K 3.6 01/22/2018 0339   CL 100 01/22/2018 0339   CO2 28  01/22/2018 0339   GLUCOSE 231 (H) 01/22/2018 0339   BUN 15 01/22/2018 0339   CREATININE 0.96 01/22/2018 0339   CALCIUM 9.2 01/22/2018 0339   PROT 7.0 01/22/2018 0339   ALBUMIN 3.3 (L) 01/22/2018 0339   AST 35 01/22/2018 0339   ALT 38 01/22/2018 0339   ALKPHOS 71 01/22/2018 0339   BILITOT 0.6 01/22/2018 0339   GFRNONAA >60 01/22/2018 0339   GFRAA >60 01/22/2018 0339       Component Value Date/Time   WBC 6.8 01/22/2018 0339   RBC 4.48 01/22/2018 0339   HGB 12.9 (L) 01/22/2018 0339   HCT 41.7 01/22/2018 0339   PLT 211 01/22/2018 0339   MCV 93.1 01/22/2018 0339   MCH 28.8 01/22/2018 0339   MCHC 30.9 01/22/2018 0339   RDW 14.0 01/22/2018 0339   LYMPHSABS 1.8 01/22/2018 0339   MONOABS 0.6 01/22/2018 0339   EOSABS 0.3 01/22/2018 0339   BASOSABS 0.1 01/22/2018 0339    No results found for: POCLITH, LITHIUM   No results found for: PHENYTOIN, PHENOBARB, VALPROATE, CBMZ   .res Assessment: Plan:    Kelsen was seen today for follow-up, depression, schizophrenia and other.  Diagnoses and all orders for this visit:  Schizophrenia, paranoid ([place])  Mixed obsessional thoughts and acts  Generalized anxiety disorder  Late onset Alzheimer's disease with behavioral disturbance ([place])  Insomnia due to mental condition  Patient has been under my psychiatric years since 1998 .unlikely that med change will reduce the paranoia which is chronic.  It is no worse than last visit and perhaps somewhat better.  He has been on multiple psychiatric medications and has done best on this combination of Zyprexa which was recently increased to 25 mg March 19, a minimal dose of alprazolam, and fluvoxamine, and limited hydroxyzine which we have been trying to reduce for his insomnia.  Chronic paranoia without change.  Is cooperative genereally.  Weaker and needing more physical assistance.    Supportive therapy dealing with chronic paranoia and isolation from Covid.  Encourage his spirituality as a  coping mechanism.  Needs a lot of encouragement.  Repetitively asks for reassurance. Needs reassurance he does not need to go to the hospital.  Discussed potential metabolic side effects associated with atypical antipsychotics, as well as potential risk for movement side effects. Advised pt to contact office if movement side effects occur.   Disc he's taking more than the usuall highest dose of olanzapine but is medically necessary for paranoia.  Cognition has declined according to [employer].  MAR was reviewed from [facility].  Hydroxyzine is still on the [facility] but he has not been receiving it recently.  It is been encouraged for him to stay off of that for cognitive reasons.  No med changes today  .25 min appt  FU 4 mos  Lynder Parents, MD, DFAPA .    Future Appointments  Date Time Provider [place]  08/28/2018  9:30 AM Gardiner Barefoot, DPM [facility]    No orders of the defined types were placed in this encounter.     -------------------------------

## 2018-08-28 ENCOUNTER — Ambulatory Visit: Payer: Medicare Other | Admitting: Podiatry

## 2018-09-22 ENCOUNTER — Encounter: Payer: Self-pay | Admitting: Podiatry

## 2018-09-22 ENCOUNTER — Ambulatory Visit (INDEPENDENT_AMBULATORY_CARE_PROVIDER_SITE_OTHER): Payer: Medicare Other | Admitting: Podiatry

## 2018-09-22 ENCOUNTER — Other Ambulatory Visit: Payer: Self-pay

## 2018-09-22 DIAGNOSIS — B351 Tinea unguium: Secondary | ICD-10-CM

## 2018-09-22 DIAGNOSIS — E1159 Type 2 diabetes mellitus with other circulatory complications: Secondary | ICD-10-CM

## 2018-09-22 DIAGNOSIS — M2042 Other hammer toe(s) (acquired), left foot: Secondary | ICD-10-CM

## 2018-09-22 DIAGNOSIS — M79609 Pain in unspecified limb: Secondary | ICD-10-CM

## 2018-09-22 DIAGNOSIS — M2041 Other hammer toe(s) (acquired), right foot: Secondary | ICD-10-CM

## 2018-09-22 NOTE — Progress Notes (Signed)
Complaint:  Visit Type: Patient returns to my office for continued preventative foot care services. Complaint: Patient states" my nails have grown long and thick and become painful to walk and wear shoes" Patient has been diagnosed with DM with vascular disease . The patient presents for preventative foot care services.    Patient has callus right hallux with no bandage.   He presents to the office for preventative foot care services.   No changes to ROS.      Podiatric Exam: Vascular: dorsalis pedis and posterior tibial pulses are weakly  palpable bilateral. Capillary return is immediate. Cold feet noted. Thin shiny skin noted.  Swelling noted both feet. Sensorium: Diminished  Semmes Weinstein monofilament test. Diminished  tactile sensation bilaterally. Nail Exam: Pt has thick disfigured discolored nails with subungual debris noted entire nail hallux through fifth toenails right foot and multiple nails left foot. Ulcer Exam: Healed ulcer right hallux.  No infection. Orthopedic Exam: Muscle tone and strength are WNL. No limitations in general ROM. No crepitus or effusions noted. Foot type and digits show no abnormalities. Bony prominences are unremarkable. Skin: No Porokeratosis. No infection or ulcers  Diagnosis:  Onychomycosis, , Pain in right toe, pain in left toes,  Healed ulcer right hallux.  Treatment & Plan Procedures and Treatment: Consent by patient was obtained for treatment procedures.   Debridement of mycotic and hypertrophic toenails, 1 through 5 bilateral and clearing of subungual debris.    RTC 3 months for nail care.       Return Visit-Office Procedure: Patient instructed to return to the office for a follow up visit 12 weeks  for continued evaluation and treatment.    Gardiner Barefoot DPM

## 2018-09-23 DIAGNOSIS — E1122 Type 2 diabetes mellitus with diabetic chronic kidney disease: Secondary | ICD-10-CM | POA: Diagnosis not present

## 2018-10-16 DIAGNOSIS — E1169 Type 2 diabetes mellitus with other specified complication: Secondary | ICD-10-CM | POA: Diagnosis not present

## 2018-10-16 DIAGNOSIS — I10 Essential (primary) hypertension: Secondary | ICD-10-CM | POA: Diagnosis not present

## 2018-10-19 DIAGNOSIS — I509 Heart failure, unspecified: Secondary | ICD-10-CM | POA: Diagnosis not present

## 2018-10-19 DIAGNOSIS — E785 Hyperlipidemia, unspecified: Secondary | ICD-10-CM | POA: Diagnosis not present

## 2018-10-19 DIAGNOSIS — E119 Type 2 diabetes mellitus without complications: Secondary | ICD-10-CM | POA: Diagnosis not present

## 2018-10-19 DIAGNOSIS — D649 Anemia, unspecified: Secondary | ICD-10-CM | POA: Diagnosis not present

## 2018-10-19 DIAGNOSIS — I10 Essential (primary) hypertension: Secondary | ICD-10-CM | POA: Diagnosis not present

## 2018-10-23 ENCOUNTER — Ambulatory Visit (INDEPENDENT_AMBULATORY_CARE_PROVIDER_SITE_OTHER): Payer: Medicare Other | Admitting: Psychiatry

## 2018-10-23 ENCOUNTER — Other Ambulatory Visit: Payer: Self-pay

## 2018-10-23 ENCOUNTER — Encounter: Payer: Self-pay | Admitting: Psychiatry

## 2018-10-23 DIAGNOSIS — F200 Paranoid schizophrenia: Secondary | ICD-10-CM

## 2018-10-23 DIAGNOSIS — F0281 Dementia in other diseases classified elsewhere with behavioral disturbance: Secondary | ICD-10-CM

## 2018-10-23 DIAGNOSIS — F5105 Insomnia due to other mental disorder: Secondary | ICD-10-CM

## 2018-10-23 DIAGNOSIS — F422 Mixed obsessional thoughts and acts: Secondary | ICD-10-CM

## 2018-10-23 DIAGNOSIS — G301 Alzheimer's disease with late onset: Secondary | ICD-10-CM

## 2018-10-23 DIAGNOSIS — F411 Generalized anxiety disorder: Secondary | ICD-10-CM | POA: Diagnosis not present

## 2018-10-23 DIAGNOSIS — F02818 Dementia in other diseases classified elsewhere, unspecified severity, with other behavioral disturbance: Secondary | ICD-10-CM

## 2018-10-23 NOTE — Progress Notes (Signed)
Ricky [name] ES:[id number] [birth year] 78 y.o.   Virtual Visit via Telephone Note  I connected with pt by telephone and verified that I am speaking with the correct person using two identifiers.   I discussed the limitations, risks, security and privacy concerns of performing an evaluation and management service by telephone and the availability of in person appointments. I also discussed with the patient that there may be a patient responsible charge related to this service. The patient expressed understanding and agreed to proceed.  I discussed the assessment and treatment plan with the patient. The patient was provided an opportunity to ask questions and all were answered. The patient agreed with the plan and demonstrated an understanding of the instructions.   The patient was advised to call back or seek an in-person evaluation if the symptoms worsen or if the condition fails to improve as anticipated.  I provided 30 minutes of non-face-to-face time during this encounter. The call started at 130 and ended at 200. The patient was located at [facility] and the provider was located office.   Subjective:   Patient ID:  Ricky [name]. is a 78 y.o. (DOB 1940-04-08) male.  Chief Complaint:  Chief Complaint  Patient presents with  . Follow-up    Medication Management  . Depression    Medication Management  . Schizophrenia    Medication Management  . Other    OCD    Depression        Associated symptoms include decreased concentration and fatigue.  Associated symptoms include no suicidal ideas.  Ricky [name]. presents to the office today for follow-up of schizophrenia and dementia.  Resident of [facility].  Last seen July 2020.  No meds were changed  CO several physical problems including his WC.  Weaker even than last visit. Harder time getting here DT weakness.  Spends a lot of time in bed.  Intrusive thoughts of needing to go to the hospital DT his mental  health problems, but I know I don't really need to go.  Denies unusual anxiety.  Sometimes emotional confusion and breakouts of worry and fear.  Says sig STM px but better LTM.  Asks about questions for reassurance.    Rates himself in 99% range and asks others to rate how he's doing also.  Still thinks God is mad at him and others also.  Able to read the Bible right now but sometimes cause paranoia.  Upset friend [name] can't visit.  STM problems.    Stressed by [employer] and not able to get out with [place].  Not depressed.. Struggles with self identity and anxiety.  Still gets confused spiritually and some degree of paranoia.  Goes to bed 830.  Doesn't bother him if he lays awake at night now.  [facility] has called with occasional behavioral disturbances.  Eats in his room.  No complaints voiced about staff or residents except one.  Past Psychiatric Medication Trials:  All of them are unknown, haloperidol alone, serentil,, benztropine, Xanax, Stelazine, Moban, perphenazine, paroxetine, risperidone, Thorazine, Seroquel 800 mg, sertraline side effects, loxapine, trazodone no response he has been on Zyprexa since September 2006 and that has produced the best control of his paranoia.,  Hydroxyzine  Review of Systems:  Review of Systems  Constitutional: Positive for fatigue.  Genitourinary: Positive for enuresis.  Musculoskeletal: Positive for arthralgias, back pain and gait problem.  Neurological: Positive for dizziness, tremors and weakness.  Psychiatric/Behavioral: Positive for confusion, decreased concentration, depression, hallucinations  and sleep disturbance. Negative for self-injury and suicidal ideas. The patient is nervous/anxious.     Medications: I have reviewed the patient's current medications.  Current Outpatient Medications  Medication Sig Dispense Refill  . ALPRAZolam (XANAX) 0.5 MG tablet Take 1 tablet (0.5 mg total) by mouth at bedtime. 2 tablet 0  . amLODipine (NORVASC) 5 MG  tablet Take 5 mg by mouth daily.    . Calcium Citrate-Vitamin D (CITRACAL + D PO) Take 1 tablet by mouth daily.    . Cholecalciferol (VITAMIN D-3) 1000 units CAPS Take 1 capsule by mouth daily.    Marland Kitchen docusate sodium (COLACE) 100 MG capsule Take 100 mg by mouth 2 (two) times daily.    . empagliflozin (JARDIANCE) 10 MG TABS tablet Take 10 mg by mouth daily. 2 tablet 0  . fluvoxaMINE (LUVOX) 100 MG tablet Take 1 tablet (100 mg total) by mouth at bedtime. 2 tablet 0  . furosemide (LASIX) 20 MG tablet Take 20 mg by mouth daily before breakfast.     . HUMALOG KWIKPEN 100 UNIT/ML KiwkPen Inject 2-11 Units into the skin See admin instructions. Times: 0630 1130 1630  Scale: 101-150: 2 units 151-200: 3 units 201-250: 5 units 251-300: 7 units 301-350: 9 units >350: 11 units    . hydrOXYzine (ATARAX/VISTARIL) 25 MG tablet Take 1 tablet (25 mg total) by mouth at bedtime as needed (sleep). 2 tablet 0  . insulin detemir (LEVEMIR) 100 UNIT/ML injection Inject 0.3 mLs (30 Units total) into the skin at bedtime. (Patient taking differently: Inject 24 Units into the skin at bedtime. ) 10 mL 11  . Menthol, Topical Analgesic, (BIOFREEZE) 4 % GEL Apply topically.    . Multiple Vitamins-Minerals (THERAGRAN-M PREMIER 50 PLUS PO) Take 1 tablet by mouth daily.    Marland Kitchen MYRBETRIQ 25 MG TB24 tablet Take 25 mg by mouth daily.     Marland Kitchen OLANZapine (ZYPREXA) 5 MG tablet Take 5 tablets (25 mg total) by mouth daily. 2 tablet 0  . omeprazole (PRILOSEC) 40 MG capsule Take 40 mg by mouth daily.     . polyethylene glycol powder (GAVILAX) 17 GM/SCOOP powder Take 1 Container by mouth once.    . pravastatin (PRAVACHOL) 40 MG tablet Take 40 mg by mouth daily after lunch.     . tamsulosin (FLOMAX) 0.4 MG CAPS capsule Take 0.4 mg by mouth daily.     Marland Kitchen zolpidem (AMBIEN) 5 MG tablet Take 1 tablet (5 mg total) by mouth at bedtime. 2 tablet 0   No current facility-administered medications for this visit.     Medication Side Effects:  Other: dry mouth  Allergies:  Allergies  Allergen Reactions  . Asa [Aspirin]     "Dr told me not to take it"  . Codeine Other (See Comments)    DIZZINESS with Tylenol 3  . Tylenol [Acetaminophen] Other (See Comments)    Dizziness with tylenol 3    Past Medical History:  Diagnosis Date  . Abnormality of gait   . Adenomatous colon polyp   . Arthritis   . Cataract   . Dementia ([place])   . Depression   . Diabetes mellitus without complication ([place])   . Fatty liver   . Hyperlipidemia   . Hypertension   . Internal hemorrhoids   . Other malaise and fatigue   . Peripheral edema   . Schizophrenia ([place])   . Type II or unspecified type diabetes mellitus without mention of complication, not stated as uncontrolled   . Urinary  frequency   . Urinary retention     Family History  Problem Relation Age of Onset  . Brain cancer Father     Social History   Socioeconomic History  . Marital status: Single    Spouse name: Not on file  . Number of children: Not on file  . Years of education: college  . Highest education level: Not on file  Occupational History    Employer: RETIRED    Comment: Disabled  Social Needs  . Financial resource strain: Not on file  . Food insecurity    Worry: Not on file    Inability: Not on file  . Transportation needs    Medical: Not on file    Non-medical: Not on file  Tobacco Use  . Smoking status: Never Smoker  . Smokeless tobacco: Never Used  Substance and Sexual Activity  . Alcohol use: No  . Drug use: No  . Sexual activity: Not on file  Lifestyle  . Physical activity    Days per week: Not on file    Minutes per session: Not on file  . Stress: Not on file  Relationships  . Social Herbalist on phone: Not on file    Gets together: Not on file    Attends religious service: Not on file    Active member of club or organization: Not on file    Attends meetings of clubs or organizations: Not on file    Relationship status: Not on  file  . Intimate partner violence    Fear of current or ex partner: Not on file    Emotionally abused: Not on file    Physically abused: Not on file    Forced sexual activity: Not on file  Other Topics Concern  . Not on file  Social History Narrative   Patient is disabled and he has not worked since 1962. Patient has some college education.Patient drinks 2 cups of coffee daily.   Right handed.    Past Medical History, Surgical history, Social history, and Family history were reviewed and updated as appropriate.   Please see review of systems for further details on the patient's review from today.   Objective:   Physical Exam:  There were no vitals taken for this visit.  Physical Exam Neurological:     Mental Status: He is alert and oriented to person, place, and time.     Cranial Nerves: Dysarthria present.     Comments: Mild dysarthria chronic   Psychiatric:        Attention and Perception: Attention normal. He is attentive. He perceives auditory hallucinations. He does not perceive visual hallucinations.        Mood and Affect: Mood is anxious. Mood is not depressed. Affect is not angry or tearful.        Speech: Speech normal. Speech is not rapid and pressured or slurred.        Behavior: Behavior is slowed. Behavior is not agitated or aggressive. Behavior is cooperative.        Thought Content: Thought content is paranoid and delusional. Thought content does not include homicidal or suicidal ideation. Thought content does not include homicidal or suicidal plan.        Cognition and Memory: Cognition is impaired. Memory is impaired. He does not exhibit impaired recent memory.     Comments: Ignores voices. Fair insight and judgment. Talkative and repeats himself some. Needy for reassurance as usual but has trouble articulating what  he needs exactly. Some IOR re commericials on TV. Obsessive thought and compulsive behaviors are present but not disabling.     Lab Review:      Component Value Date/Time   NA 139 01/22/2018 0339   K 3.6 01/22/2018 0339   CL 100 01/22/2018 0339   CO2 28 01/22/2018 0339   GLUCOSE 231 (H) 01/22/2018 0339   BUN 15 01/22/2018 0339   CREATININE 0.96 01/22/2018 0339   CALCIUM 9.2 01/22/2018 0339   PROT 7.0 01/22/2018 0339   ALBUMIN 3.3 (L) 01/22/2018 0339   AST 35 01/22/2018 0339   ALT 38 01/22/2018 0339   ALKPHOS 71 01/22/2018 0339   BILITOT 0.6 01/22/2018 0339   GFRNONAA >60 01/22/2018 0339   GFRAA >60 01/22/2018 0339       Component Value Date/Time   WBC 6.8 01/22/2018 0339   RBC 4.48 01/22/2018 0339   HGB 12.9 (L) 01/22/2018 0339   HCT 41.7 01/22/2018 0339   PLT 211 01/22/2018 0339   MCV 93.1 01/22/2018 0339   MCH 28.8 01/22/2018 0339   MCHC 30.9 01/22/2018 0339   RDW 14.0 01/22/2018 0339   LYMPHSABS 1.8 01/22/2018 0339   MONOABS 0.6 01/22/2018 0339   EOSABS 0.3 01/22/2018 0339   BASOSABS 0.1 01/22/2018 0339    No results found for: POCLITH, LITHIUM   No results found for: PHENYTOIN, PHENOBARB, VALPROATE, CBMZ   .res Assessment: Plan:    Ricky [name] was seen today for follow-up, depression, schizophrenia and other.  Diagnoses and all orders for this visit:  Schizophrenia, paranoid ([place])  Mixed obsessional thoughts and acts  Generalized anxiety disorder  Late onset Alzheimer's disease with behavioral disturbance ([place])  Insomnia due to mental condition  Patient has been under my psychiatric years since 1998 .unlikely that med change will reduce the paranoia which is chronic.  It is no worse than last visit.  He has been on multiple psychiatric medications and has done best on this combination of Zyprexa which was recently increased to 25 mg March 19, a minimal dose of alprazolam, and fluvoxamine, and limited hydroxyzine which we have been trying to reduce for his insomnia.  Chronic paranoia without change.  Is cooperative genereally.  Weaker and needing more physical assistance.    Supportive therapy  dealing with chronic paranoia and isolation from Covid.  Encourage his spirituality as a coping mechanism.  Needs a lot of encouragement.  Repetitively asks for reassurance. Needs reassurance he does not need to go to the hospital.  Discussed potential metabolic side effects associated with atypical antipsychotics, as well as potential risk for movement side effects. Advised pt to contact office if movement side effects occur.   Disc he's taking more than the usuall highest dose of olanzapine but is medically necessary for paranoia.  Cognition has declined according to [employer].  MAR was reviewed from [facility].  Hydroxyzine is still on the [facility] but he has not been receiving it recently.  It is been encouraged for him to stay off of that for cognitive reasons.  No med changes today  .25 min appt  FU 4 mos  Lynder Parents, MD, DFAPA .    Future Appointments  Date Time Provider [place]  12/01/2018  2:45 PM Gardiner Barefoot, DPM [facility]    No orders of the defined types were placed in this encounter.     -------------------------------

## 2018-10-29 DIAGNOSIS — E1169 Type 2 diabetes mellitus with other specified complication: Secondary | ICD-10-CM | POA: Diagnosis not present

## 2018-10-29 DIAGNOSIS — I10 Essential (primary) hypertension: Secondary | ICD-10-CM | POA: Diagnosis not present

## 2018-10-29 DIAGNOSIS — R0982 Postnasal drip: Secondary | ICD-10-CM | POA: Diagnosis not present

## 2018-11-06 DIAGNOSIS — I10 Essential (primary) hypertension: Secondary | ICD-10-CM | POA: Diagnosis not present

## 2018-11-06 DIAGNOSIS — R05 Cough: Secondary | ICD-10-CM | POA: Diagnosis not present

## 2018-11-06 DIAGNOSIS — D649 Anemia, unspecified: Secondary | ICD-10-CM | POA: Diagnosis not present

## 2018-11-07 DIAGNOSIS — E1169 Type 2 diabetes mellitus with other specified complication: Secondary | ICD-10-CM | POA: Diagnosis not present

## 2018-11-07 DIAGNOSIS — J181 Lobar pneumonia, unspecified organism: Secondary | ICD-10-CM | POA: Diagnosis not present

## 2018-11-08 DIAGNOSIS — I10 Essential (primary) hypertension: Secondary | ICD-10-CM | POA: Diagnosis not present

## 2018-11-10 DIAGNOSIS — E785 Hyperlipidemia, unspecified: Secondary | ICD-10-CM | POA: Diagnosis not present

## 2018-11-10 DIAGNOSIS — J189 Pneumonia, unspecified organism: Secondary | ICD-10-CM | POA: Diagnosis not present

## 2018-11-10 DIAGNOSIS — E1169 Type 2 diabetes mellitus with other specified complication: Secondary | ICD-10-CM | POA: Diagnosis not present

## 2018-11-12 DIAGNOSIS — J189 Pneumonia, unspecified organism: Secondary | ICD-10-CM | POA: Diagnosis not present

## 2018-11-12 DIAGNOSIS — E1169 Type 2 diabetes mellitus with other specified complication: Secondary | ICD-10-CM | POA: Diagnosis not present

## 2018-11-12 DIAGNOSIS — I10 Essential (primary) hypertension: Secondary | ICD-10-CM | POA: Diagnosis not present

## 2018-11-14 DIAGNOSIS — I10 Essential (primary) hypertension: Secondary | ICD-10-CM | POA: Diagnosis not present

## 2018-11-14 DIAGNOSIS — J189 Pneumonia, unspecified organism: Secondary | ICD-10-CM | POA: Diagnosis not present

## 2018-11-16 DIAGNOSIS — E1169 Type 2 diabetes mellitus with other specified complication: Secondary | ICD-10-CM | POA: Diagnosis not present

## 2018-11-16 DIAGNOSIS — J189 Pneumonia, unspecified organism: Secondary | ICD-10-CM | POA: Diagnosis not present

## 2018-11-16 DIAGNOSIS — I10 Essential (primary) hypertension: Secondary | ICD-10-CM | POA: Diagnosis not present

## 2018-11-18 DIAGNOSIS — E1122 Type 2 diabetes mellitus with diabetic chronic kidney disease: Secondary | ICD-10-CM | POA: Diagnosis not present

## 2018-11-18 DIAGNOSIS — J189 Pneumonia, unspecified organism: Secondary | ICD-10-CM | POA: Diagnosis not present

## 2018-12-01 ENCOUNTER — Other Ambulatory Visit: Payer: Self-pay

## 2018-12-01 ENCOUNTER — Ambulatory Visit (INDEPENDENT_AMBULATORY_CARE_PROVIDER_SITE_OTHER): Payer: Medicare Other | Admitting: Podiatry

## 2018-12-01 ENCOUNTER — Encounter: Payer: Self-pay | Admitting: Podiatry

## 2018-12-01 DIAGNOSIS — M79609 Pain in unspecified limb: Secondary | ICD-10-CM

## 2018-12-01 DIAGNOSIS — B351 Tinea unguium: Secondary | ICD-10-CM

## 2018-12-01 DIAGNOSIS — E1159 Type 2 diabetes mellitus with other circulatory complications: Secondary | ICD-10-CM | POA: Diagnosis not present

## 2018-12-01 NOTE — Progress Notes (Signed)
Complaint:  Visit Type: Patient returns to my office for continued preventative foot care services. Complaint: Patient states" my nails have grown long and thick and become painful to walk and wear shoes" Patient has been diagnosed with DM with vascular disease .    He presents to the office for preventative foot care services.   No changes to ROS.      Podiatric Exam: Vascular: dorsalis pedis and posterior tibial pulses are weakly  palpable bilateral. Capillary return is immediate. Cold feet noted. Thin shiny skin noted.  Swelling noted both feet. Sensorium: Diminished  Semmes Weinstein monofilament test. Diminished  tactile sensation bilaterally. Nail Exam: Pt has thick disfigured discolored nails with subungual debris noted entire nail hallux through fifth toenails right foot and multiple nails left foot. Ulcer Exam: Healed ulcer right hallux.  No infection. Orthopedic Exam: Muscle tone and strength are WNL. No limitations in general ROM. No crepitus or effusions noted. Foot type and digits show no abnormalities. Bony prominences are unremarkable. Skin: No Porokeratosis. No infection or ulcers  Diagnosis:  Onychomycosis, , Pain in right toe, pain in left toes,    Treatment & Plan Procedures and Treatment: Consent by patient was obtained for treatment procedures.   Debridement of mycotic and hypertrophic toenails, 1 through 5 bilateral and clearing of subungual debris.    RTC 3 months for nail care.   Scaphoid pad added by Liliane Channel to his insole.     Return Visit-Office Procedure: Patient instructed to return to the office for a follow up visit 9  weeks  for continued evaluation and treatment.    Gardiner Barefoot DPM

## 2018-12-15 DIAGNOSIS — E1169 Type 2 diabetes mellitus with other specified complication: Secondary | ICD-10-CM | POA: Diagnosis not present

## 2018-12-15 DIAGNOSIS — I110 Hypertensive heart disease with heart failure: Secondary | ICD-10-CM | POA: Diagnosis not present

## 2018-12-15 DIAGNOSIS — R1312 Dysphagia, oropharyngeal phase: Secondary | ICD-10-CM | POA: Diagnosis not present

## 2018-12-15 DIAGNOSIS — E785 Hyperlipidemia, unspecified: Secondary | ICD-10-CM | POA: Diagnosis not present

## 2018-12-16 DIAGNOSIS — R1312 Dysphagia, oropharyngeal phase: Secondary | ICD-10-CM | POA: Diagnosis not present

## 2018-12-16 DIAGNOSIS — I110 Hypertensive heart disease with heart failure: Secondary | ICD-10-CM | POA: Diagnosis not present

## 2018-12-16 DIAGNOSIS — E1169 Type 2 diabetes mellitus with other specified complication: Secondary | ICD-10-CM | POA: Diagnosis not present

## 2018-12-16 DIAGNOSIS — E785 Hyperlipidemia, unspecified: Secondary | ICD-10-CM | POA: Diagnosis not present

## 2018-12-17 DIAGNOSIS — E785 Hyperlipidemia, unspecified: Secondary | ICD-10-CM | POA: Diagnosis not present

## 2018-12-17 DIAGNOSIS — E1169 Type 2 diabetes mellitus with other specified complication: Secondary | ICD-10-CM | POA: Diagnosis not present

## 2018-12-17 DIAGNOSIS — I110 Hypertensive heart disease with heart failure: Secondary | ICD-10-CM | POA: Diagnosis not present

## 2018-12-17 DIAGNOSIS — R1312 Dysphagia, oropharyngeal phase: Secondary | ICD-10-CM | POA: Diagnosis not present

## 2018-12-18 DIAGNOSIS — I110 Hypertensive heart disease with heart failure: Secondary | ICD-10-CM | POA: Diagnosis not present

## 2018-12-18 DIAGNOSIS — R1312 Dysphagia, oropharyngeal phase: Secondary | ICD-10-CM | POA: Diagnosis not present

## 2018-12-18 DIAGNOSIS — E1169 Type 2 diabetes mellitus with other specified complication: Secondary | ICD-10-CM | POA: Diagnosis not present

## 2018-12-18 DIAGNOSIS — E785 Hyperlipidemia, unspecified: Secondary | ICD-10-CM | POA: Diagnosis not present

## 2018-12-19 DIAGNOSIS — E559 Vitamin D deficiency, unspecified: Secondary | ICD-10-CM | POA: Diagnosis not present

## 2018-12-19 DIAGNOSIS — Z79899 Other long term (current) drug therapy: Secondary | ICD-10-CM | POA: Diagnosis not present

## 2018-12-19 DIAGNOSIS — E039 Hypothyroidism, unspecified: Secondary | ICD-10-CM | POA: Diagnosis not present

## 2018-12-21 DIAGNOSIS — I110 Hypertensive heart disease with heart failure: Secondary | ICD-10-CM | POA: Diagnosis not present

## 2018-12-21 DIAGNOSIS — R1312 Dysphagia, oropharyngeal phase: Secondary | ICD-10-CM | POA: Diagnosis not present

## 2018-12-21 DIAGNOSIS — E1169 Type 2 diabetes mellitus with other specified complication: Secondary | ICD-10-CM | POA: Diagnosis not present

## 2018-12-21 DIAGNOSIS — E785 Hyperlipidemia, unspecified: Secondary | ICD-10-CM | POA: Diagnosis not present

## 2018-12-22 DIAGNOSIS — I110 Hypertensive heart disease with heart failure: Secondary | ICD-10-CM | POA: Diagnosis not present

## 2018-12-22 DIAGNOSIS — R2689 Other abnormalities of gait and mobility: Secondary | ICD-10-CM | POA: Diagnosis not present

## 2018-12-22 DIAGNOSIS — E1169 Type 2 diabetes mellitus with other specified complication: Secondary | ICD-10-CM | POA: Diagnosis not present

## 2018-12-22 DIAGNOSIS — E785 Hyperlipidemia, unspecified: Secondary | ICD-10-CM | POA: Diagnosis not present

## 2018-12-23 DIAGNOSIS — E1169 Type 2 diabetes mellitus with other specified complication: Secondary | ICD-10-CM | POA: Diagnosis not present

## 2018-12-23 DIAGNOSIS — I110 Hypertensive heart disease with heart failure: Secondary | ICD-10-CM | POA: Diagnosis not present

## 2018-12-23 DIAGNOSIS — E785 Hyperlipidemia, unspecified: Secondary | ICD-10-CM | POA: Diagnosis not present

## 2018-12-23 DIAGNOSIS — R2689 Other abnormalities of gait and mobility: Secondary | ICD-10-CM | POA: Diagnosis not present

## 2018-12-24 DIAGNOSIS — I110 Hypertensive heart disease with heart failure: Secondary | ICD-10-CM | POA: Diagnosis not present

## 2018-12-24 DIAGNOSIS — E1169 Type 2 diabetes mellitus with other specified complication: Secondary | ICD-10-CM | POA: Diagnosis not present

## 2018-12-24 DIAGNOSIS — R2689 Other abnormalities of gait and mobility: Secondary | ICD-10-CM | POA: Diagnosis not present

## 2018-12-24 DIAGNOSIS — E785 Hyperlipidemia, unspecified: Secondary | ICD-10-CM | POA: Diagnosis not present

## 2018-12-25 DIAGNOSIS — I110 Hypertensive heart disease with heart failure: Secondary | ICD-10-CM | POA: Diagnosis not present

## 2018-12-25 DIAGNOSIS — R2689 Other abnormalities of gait and mobility: Secondary | ICD-10-CM | POA: Diagnosis not present

## 2018-12-25 DIAGNOSIS — E785 Hyperlipidemia, unspecified: Secondary | ICD-10-CM | POA: Diagnosis not present

## 2018-12-25 DIAGNOSIS — E1169 Type 2 diabetes mellitus with other specified complication: Secondary | ICD-10-CM | POA: Diagnosis not present

## 2018-12-28 ENCOUNTER — Ambulatory Visit (INDEPENDENT_AMBULATORY_CARE_PROVIDER_SITE_OTHER): Payer: Medicare Other | Admitting: Psychiatry

## 2018-12-28 ENCOUNTER — Encounter: Payer: Self-pay | Admitting: Psychiatry

## 2018-12-28 ENCOUNTER — Other Ambulatory Visit: Payer: Self-pay

## 2018-12-28 DIAGNOSIS — E1169 Type 2 diabetes mellitus with other specified complication: Secondary | ICD-10-CM | POA: Diagnosis not present

## 2018-12-28 DIAGNOSIS — F422 Mixed obsessional thoughts and acts: Secondary | ICD-10-CM | POA: Diagnosis not present

## 2018-12-28 DIAGNOSIS — F5105 Insomnia due to other mental disorder: Secondary | ICD-10-CM

## 2018-12-28 DIAGNOSIS — F0281 Dementia in other diseases classified elsewhere with behavioral disturbance: Secondary | ICD-10-CM

## 2018-12-28 DIAGNOSIS — R2689 Other abnormalities of gait and mobility: Secondary | ICD-10-CM | POA: Diagnosis not present

## 2018-12-28 DIAGNOSIS — F200 Paranoid schizophrenia: Secondary | ICD-10-CM

## 2018-12-28 DIAGNOSIS — F411 Generalized anxiety disorder: Secondary | ICD-10-CM | POA: Diagnosis not present

## 2018-12-28 DIAGNOSIS — E785 Hyperlipidemia, unspecified: Secondary | ICD-10-CM | POA: Diagnosis not present

## 2018-12-28 DIAGNOSIS — G301 Alzheimer's disease with late onset: Secondary | ICD-10-CM

## 2018-12-28 DIAGNOSIS — I110 Hypertensive heart disease with heart failure: Secondary | ICD-10-CM | POA: Diagnosis not present

## 2018-12-28 NOTE — Progress Notes (Signed)
Ricky [name] [id number] 1940/06/28 78 y.o.   Virtual Visit via Telephone Note  I connected with pt by telephone and verified that I am speaking with the correct person using two identifiers.   I discussed the limitations, risks, security and privacy concerns of performing an evaluation and management service by telephone and the availability of in person appointments. I also discussed with the patient that there may be a patient responsible charge related to this service. The patient expressed understanding and agreed to proceed.  I discussed the assessment and treatment plan with the patient. The patient was provided an opportunity to ask questions and all were answered. The patient agreed with the plan and demonstrated an understanding of the instructions.   The patient was advised to call back or seek an in-person evaluation if the symptoms worsen or if the condition fails to improve as anticipated.  I provided 30 minutes of non-face-to-face time during this encounter. The call started at 1130 and ended at 1200. The patient was located at [facility] and the provider was located office.   Subjective:   Patient ID:  Ricky [name]. is a 78 y.o. (DOB Aug 01, 1940) male.  Chief Complaint:  Chief Complaint  Patient presents with  . Sleeping Problem    med management  . Memory Loss  . Anxiety    Depression        Associated symptoms include decreased concentration and fatigue.  Associated symptoms include no headaches and no suicidal ideas.  Ricky [name]. presents to the office today for follow-up of schizophrenia and dementia.  Resident of [facility].  Last seen October 23, 2018.  No meds were changed.  Sleep poor last night.  Sometimes pain is that problem and other times he doesn't know.  Upset virus interferes with activity. Got transcripts from [facility] and [employer] and likes reading them for reassurance and encouragement.   CO several physical  problems including his WC.  Weaker even than last visit. Harder time getting here DT weakness.  Spends a lot of time in bed.  Intrusive thoughts of needing to go to the hospital DT his mental health problems, but I know I don't really need to go.  Denies unusual anxiety.  Sometimes emotional confusion and breakouts of worry and fear.  Says sig STM px but better LTM.  Asks about questions for reassurance.    Continues to rate himself I the high 90% and asks for others to rate him too.    Still thinks God is mad at him and others also.  Able to read the Bible right now but sometimes cause paranoia.  Upset friend [name] can't visit.  STM problems.    Stressed by [employer] and not able to get out with [place].  Not depressed. Chronic worry.   Struggles with self identity and anxiety.  Still gets confused spiritually and some degree of paranoia.  Occ hears voices that tell him things to do, "I don't pay any attention to it".   Goes to bed 830.  Doesn't bother him if he lays awake at night now.  [facility] has called with occasional behavioral disturbances.  Eats in his room.  No complaints voiced about staff or residents except one.  Past Psychiatric Medication Trials:  All of them are unknown,  haloperidol alone, serentil, Stelazine, Moban, perphenazine, risperidone, Thorazine, Seroquel 800 mg, loxapine, been on Zyprexa since September 2006 and that has produced the best control of his paranoia. benztropine,  paroxetine, sertraline side effects,   Hydroxyzine, trazodone no response,  Xanax,  Review of Systems:  Review of Systems  Constitutional: Positive for fatigue.  Genitourinary: Positive for enuresis.  Musculoskeletal: Positive for arthralgias, back pain and gait problem.  Neurological: Positive for dizziness, tremors and weakness. Negative for headaches.  Psychiatric/Behavioral: Positive for confusion, decreased concentration, depression, hallucinations and sleep disturbance. Negative for  self-injury and suicidal ideas. The patient is nervous/anxious.     Medications: I have reviewed the patient's current medications.  Current Outpatient Medications  Medication Sig Dispense Refill  . ALPRAZolam (XANAX) 0.5 MG tablet Take 1 tablet (0.5 mg total) by mouth at bedtime. 2 tablet 0  . amLODipine (NORVASC) 5 MG tablet Take 5 mg by mouth daily.    . Calcium Citrate-Vitamin D (CITRACAL + D PO) Take 1 tablet by mouth daily.    . Cholecalciferol (VITAMIN D-3) 1000 units CAPS Take 1 capsule by mouth daily.    Marland Kitchen docusate sodium (COLACE) 100 MG capsule Take 100 mg by mouth 2 (two) times daily.    . empagliflozin (JARDIANCE) 10 MG TABS tablet Take 10 mg by mouth daily. 2 tablet 0  . fluvoxaMINE (LUVOX) 100 MG tablet Take 1 tablet (100 mg total) by mouth at bedtime. 2 tablet 0  . furosemide (LASIX) 20 MG tablet Take 20 mg by mouth daily before breakfast.     . HUMALOG KWIKPEN 100 UNIT/ML KiwkPen Inject 2-11 Units into the skin See admin instructions. Times: 0630 1130 1630  Scale: 101-150: 2 units 151-200: 3 units 201-250: 5 units 251-300: 7 units 301-350: 9 units >350: 11 units    . hydrOXYzine (ATARAX/VISTARIL) 25 MG tablet Take 1 tablet (25 mg total) by mouth at bedtime as needed (sleep). 2 tablet 0  . insulin detemir (LEVEMIR) 100 UNIT/ML injection Inject 0.3 mLs (30 Units total) into the skin at bedtime. (Patient taking differently: Inject 24 Units into the skin at bedtime. ) 10 mL 11  . Menthol, Topical Analgesic, (BIOFREEZE) 4 % GEL Apply topically.    . Multiple Vitamins-Minerals (THERAGRAN-M PREMIER 50 PLUS PO) Take 1 tablet by mouth daily.    Marland Kitchen MYRBETRIQ 25 MG TB24 tablet Take 25 mg by mouth daily.     Marland Kitchen OLANZapine (ZYPREXA) 5 MG tablet Take 5 tablets (25 mg total) by mouth daily. 2 tablet 0  . omeprazole (PRILOSEC) 40 MG capsule Take 40 mg by mouth daily.     . polyethylene glycol powder (GAVILAX) 17 GM/SCOOP powder Take 1 Container by mouth once.    . pravastatin  (PRAVACHOL) 40 MG tablet Take 40 mg by mouth daily after lunch.     . tamsulosin (FLOMAX) 0.4 MG CAPS capsule Take 0.4 mg by mouth daily.     Marland Kitchen zolpidem (AMBIEN) 5 MG tablet Take 1 tablet (5 mg total) by mouth at bedtime. 2 tablet 0   No current facility-administered medications for this visit.     Medication Side Effects: Other: dry mouth  Allergies:  Allergies  Allergen Reactions  . Asa [Aspirin]     "Dr told me not to take it"  . Codeine Other (See Comments)    DIZZINESS with Tylenol 3  . Tylenol [Acetaminophen] Other (See Comments)    Dizziness with tylenol 3    Past Medical History:  Diagnosis Date  . Abnormality of gait   . Adenomatous colon polyp   . Arthritis   . Cataract   . Dementia ([place])   . Depression   . Diabetes mellitus  without complication ([place])   . Fatty liver   . Hyperlipidemia   . Hypertension   . Internal hemorrhoids   . Other malaise and fatigue   . Peripheral edema   . Schizophrenia ([place])   . Type II or unspecified type diabetes mellitus without mention of complication, not stated as uncontrolled   . Urinary frequency   . Urinary retention     Family History  Problem Relation Age of Onset  . Brain cancer Father     Social History   Socioeconomic History  . Marital status: Single    Spouse name: Not on file  . Number of children: Not on file  . Years of education: college  . Highest education level: Not on file  Occupational History    Employer: RETIRED    Comment: Disabled  Social Needs  . Financial resource strain: Not on file  . Food insecurity    Worry: Not on file    Inability: Not on file  . Transportation needs    Medical: Not on file    Non-medical: Not on file  Tobacco Use  . Smoking status: Never Smoker  . Smokeless tobacco: Never Used  Substance and Sexual Activity  . Alcohol use: No  . Drug use: No  . Sexual activity: Not on file  Lifestyle  . Physical activity    Days per week: Not on file    Minutes per  session: Not on file  . Stress: Not on file  Relationships  . Social Herbalist on phone: Not on file    Gets together: Not on file    Attends religious service: Not on file    Active member of club or organization: Not on file    Attends meetings of clubs or organizations: Not on file    Relationship status: Not on file  . Intimate partner violence    Fear of current or ex partner: Not on file    Emotionally abused: Not on file    Physically abused: Not on file    Forced sexual activity: Not on file  Other Topics Concern  . Not on file  Social History Narrative   Patient is disabled and he has not worked since 1962. Patient has some college education.Patient drinks 2 cups of coffee daily.   Right handed.    Past Medical History, Surgical history, Social history, and Family history were reviewed and updated as appropriate.   Please see review of systems for further details on the patient's review from today.   Objective:   Physical Exam:  There were no vitals taken for this visit.  Physical Exam Neurological:     Mental Status: He is alert and oriented to person, place, and time.     Cranial Nerves: Dysarthria present.     Comments: Mild dysarthria chronic   Psychiatric:        Attention and Perception: Attention normal. He is attentive. He perceives auditory hallucinations. He does not perceive visual hallucinations.        Mood and Affect: Mood is anxious. Mood is not depressed. Affect is not angry or tearful.        Speech: Speech normal. Speech is not rapid and pressured or slurred.        Behavior: Behavior is slowed. Behavior is not agitated or aggressive. Behavior is cooperative.        Thought Content: Thought content is paranoid and delusional. Thought content does not include homicidal or  suicidal ideation. Thought content does not include homicidal or suicidal plan.        Cognition and Memory: Cognition is impaired. Memory is impaired. He does not  exhibit impaired recent memory.     Comments: Ignores voices. Fair insight and judgment. Talkative and repeats himself some. Needy for reassurance as usual but has trouble articulating what he needs exactly. Some IOR re commericials on TV. Obsessive thought and compulsive behaviors are present but not disabling.     Lab Review:     Component Value Date/Time   NA 139 01/22/2018 0339   K 3.6 01/22/2018 0339   CL 100 01/22/2018 0339   CO2 28 01/22/2018 0339   GLUCOSE 231 (H) 01/22/2018 0339   BUN 15 01/22/2018 0339   CREATININE 0.96 01/22/2018 0339   CALCIUM 9.2 01/22/2018 0339   PROT 7.0 01/22/2018 0339   ALBUMIN 3.3 (L) 01/22/2018 0339   AST 35 01/22/2018 0339   ALT 38 01/22/2018 0339   ALKPHOS 71 01/22/2018 0339   BILITOT 0.6 01/22/2018 0339   GFRNONAA >60 01/22/2018 0339   GFRAA >60 01/22/2018 0339       Component Value Date/Time   WBC 6.8 01/22/2018 0339   RBC 4.48 01/22/2018 0339   HGB 12.9 (L) 01/22/2018 0339   HCT 41.7 01/22/2018 0339   PLT 211 01/22/2018 0339   MCV 93.1 01/22/2018 0339   MCH 28.8 01/22/2018 0339   MCHC 30.9 01/22/2018 0339   RDW 14.0 01/22/2018 0339   LYMPHSABS 1.8 01/22/2018 0339   MONOABS 0.6 01/22/2018 0339   EOSABS 0.3 01/22/2018 0339   BASOSABS 0.1 01/22/2018 0339    No results found for: POCLITH, LITHIUM   No results found for: PHENYTOIN, PHENOBARB, VALPROATE, CBMZ   .res Assessment: Plan:    Ricky [name] was seen today for sleeping problem, memory loss and anxiety.  Diagnoses and all orders for this visit:  Schizophrenia, paranoid ([place])  Mixed obsessional thoughts and acts  Generalized anxiety disorder  Late onset Alzheimer's disease with behavioral disturbance ([place])  Insomnia due to mental condition  Patient has been under my psychiatric years since 1998 .unlikely that med change will reduce the paranoia and auditory hallucinations which is chronic.  It is no worse than last visit.  He has been on multiple psychiatric  medications and has done best on this combination of Zyprexa which was recently increased to 25 mg April 09, 2018, a minimal dose of alprazolam, and fluvoxamine, and limited hydroxyzine which we have been trying to reduce for his insomnia.  Chronic paranoia without change.  Is cooperative genereally.   Weaker and needing more physical assistance.    Supportive therapy dealing with chronic paranoia and isolation from Covid.  Encourage his spirituality as a coping mechanism.  Needs a lot of encouragement.  Repetitively asks for reassurance. Needs reassurance he does not need to go to the hospital.  Discussed potential metabolic side effects associated with atypical antipsychotics, as well as potential risk for movement side effects. Advised pt to contact office if movement side effects occur.   Disc he's taking more than the usuall highest dose of olanzapine but is medically necessary for paranoia.  Cognition has declined according to [employer].  MAR was not received today.  It would be best for him to take minimal number of medications at night that could have effects on his cognition but because of his frequent complaints of insomnia we can probably not stop any of those meds at this time.  No  med changes today.  Will get copy of MAR.  25 min appt  FU 4 mos  Lynder Parents, MD, DFAPA .    Future Appointments  Date Time Provider [place]  02/09/2019  2:15 PM Gardiner Barefoot, DPM [facility]    No orders of the defined types were placed in this encounter.     -------------------------------

## 2018-12-29 DIAGNOSIS — E1169 Type 2 diabetes mellitus with other specified complication: Secondary | ICD-10-CM | POA: Diagnosis not present

## 2018-12-29 DIAGNOSIS — I110 Hypertensive heart disease with heart failure: Secondary | ICD-10-CM | POA: Diagnosis not present

## 2018-12-29 DIAGNOSIS — E785 Hyperlipidemia, unspecified: Secondary | ICD-10-CM | POA: Diagnosis not present

## 2018-12-29 DIAGNOSIS — R2689 Other abnormalities of gait and mobility: Secondary | ICD-10-CM | POA: Diagnosis not present

## 2018-12-30 DIAGNOSIS — R2689 Other abnormalities of gait and mobility: Secondary | ICD-10-CM | POA: Diagnosis not present

## 2018-12-30 DIAGNOSIS — I110 Hypertensive heart disease with heart failure: Secondary | ICD-10-CM | POA: Diagnosis not present

## 2018-12-30 DIAGNOSIS — E1169 Type 2 diabetes mellitus with other specified complication: Secondary | ICD-10-CM | POA: Diagnosis not present

## 2018-12-30 DIAGNOSIS — E785 Hyperlipidemia, unspecified: Secondary | ICD-10-CM | POA: Diagnosis not present

## 2018-12-31 DIAGNOSIS — I110 Hypertensive heart disease with heart failure: Secondary | ICD-10-CM | POA: Diagnosis not present

## 2018-12-31 DIAGNOSIS — E1169 Type 2 diabetes mellitus with other specified complication: Secondary | ICD-10-CM | POA: Diagnosis not present

## 2018-12-31 DIAGNOSIS — E785 Hyperlipidemia, unspecified: Secondary | ICD-10-CM | POA: Diagnosis not present

## 2018-12-31 DIAGNOSIS — R2689 Other abnormalities of gait and mobility: Secondary | ICD-10-CM | POA: Diagnosis not present

## 2019-01-01 DIAGNOSIS — E785 Hyperlipidemia, unspecified: Secondary | ICD-10-CM | POA: Diagnosis not present

## 2019-01-01 DIAGNOSIS — I110 Hypertensive heart disease with heart failure: Secondary | ICD-10-CM | POA: Diagnosis not present

## 2019-01-01 DIAGNOSIS — E1169 Type 2 diabetes mellitus with other specified complication: Secondary | ICD-10-CM | POA: Diagnosis not present

## 2019-01-01 DIAGNOSIS — R2689 Other abnormalities of gait and mobility: Secondary | ICD-10-CM | POA: Diagnosis not present

## 2019-01-04 DIAGNOSIS — E1169 Type 2 diabetes mellitus with other specified complication: Secondary | ICD-10-CM | POA: Diagnosis not present

## 2019-01-04 DIAGNOSIS — I110 Hypertensive heart disease with heart failure: Secondary | ICD-10-CM | POA: Diagnosis not present

## 2019-01-04 DIAGNOSIS — E785 Hyperlipidemia, unspecified: Secondary | ICD-10-CM | POA: Diagnosis not present

## 2019-01-04 DIAGNOSIS — R2689 Other abnormalities of gait and mobility: Secondary | ICD-10-CM | POA: Diagnosis not present

## 2019-01-05 DIAGNOSIS — I110 Hypertensive heart disease with heart failure: Secondary | ICD-10-CM | POA: Diagnosis not present

## 2019-01-05 DIAGNOSIS — E785 Hyperlipidemia, unspecified: Secondary | ICD-10-CM | POA: Diagnosis not present

## 2019-01-05 DIAGNOSIS — E1169 Type 2 diabetes mellitus with other specified complication: Secondary | ICD-10-CM | POA: Diagnosis not present

## 2019-01-05 DIAGNOSIS — R2689 Other abnormalities of gait and mobility: Secondary | ICD-10-CM | POA: Diagnosis not present

## 2019-01-06 DIAGNOSIS — E119 Type 2 diabetes mellitus without complications: Secondary | ICD-10-CM | POA: Diagnosis not present

## 2019-01-06 DIAGNOSIS — R2689 Other abnormalities of gait and mobility: Secondary | ICD-10-CM | POA: Diagnosis not present

## 2019-01-06 DIAGNOSIS — E1169 Type 2 diabetes mellitus with other specified complication: Secondary | ICD-10-CM | POA: Diagnosis not present

## 2019-01-06 DIAGNOSIS — H35033 Hypertensive retinopathy, bilateral: Secondary | ICD-10-CM | POA: Diagnosis not present

## 2019-01-06 DIAGNOSIS — I110 Hypertensive heart disease with heart failure: Secondary | ICD-10-CM | POA: Diagnosis not present

## 2019-01-06 DIAGNOSIS — H43393 Other vitreous opacities, bilateral: Secondary | ICD-10-CM | POA: Diagnosis not present

## 2019-01-06 DIAGNOSIS — H40013 Open angle with borderline findings, low risk, bilateral: Secondary | ICD-10-CM | POA: Diagnosis not present

## 2019-01-06 DIAGNOSIS — E785 Hyperlipidemia, unspecified: Secondary | ICD-10-CM | POA: Diagnosis not present

## 2019-01-07 DIAGNOSIS — E1169 Type 2 diabetes mellitus with other specified complication: Secondary | ICD-10-CM | POA: Diagnosis not present

## 2019-01-07 DIAGNOSIS — R2689 Other abnormalities of gait and mobility: Secondary | ICD-10-CM | POA: Diagnosis not present

## 2019-01-07 DIAGNOSIS — I110 Hypertensive heart disease with heart failure: Secondary | ICD-10-CM | POA: Diagnosis not present

## 2019-01-07 DIAGNOSIS — E785 Hyperlipidemia, unspecified: Secondary | ICD-10-CM | POA: Diagnosis not present

## 2019-01-08 DIAGNOSIS — R2689 Other abnormalities of gait and mobility: Secondary | ICD-10-CM | POA: Diagnosis not present

## 2019-01-08 DIAGNOSIS — E1169 Type 2 diabetes mellitus with other specified complication: Secondary | ICD-10-CM | POA: Diagnosis not present

## 2019-01-08 DIAGNOSIS — E785 Hyperlipidemia, unspecified: Secondary | ICD-10-CM | POA: Diagnosis not present

## 2019-01-08 DIAGNOSIS — I110 Hypertensive heart disease with heart failure: Secondary | ICD-10-CM | POA: Diagnosis not present

## 2019-01-11 DIAGNOSIS — I110 Hypertensive heart disease with heart failure: Secondary | ICD-10-CM | POA: Diagnosis not present

## 2019-01-11 DIAGNOSIS — R2689 Other abnormalities of gait and mobility: Secondary | ICD-10-CM | POA: Diagnosis not present

## 2019-01-11 DIAGNOSIS — E785 Hyperlipidemia, unspecified: Secondary | ICD-10-CM | POA: Diagnosis not present

## 2019-01-11 DIAGNOSIS — E1169 Type 2 diabetes mellitus with other specified complication: Secondary | ICD-10-CM | POA: Diagnosis not present

## 2019-01-12 DIAGNOSIS — R2689 Other abnormalities of gait and mobility: Secondary | ICD-10-CM | POA: Diagnosis not present

## 2019-01-12 DIAGNOSIS — I110 Hypertensive heart disease with heart failure: Secondary | ICD-10-CM | POA: Diagnosis not present

## 2019-01-12 DIAGNOSIS — E1169 Type 2 diabetes mellitus with other specified complication: Secondary | ICD-10-CM | POA: Diagnosis not present

## 2019-01-12 DIAGNOSIS — E785 Hyperlipidemia, unspecified: Secondary | ICD-10-CM | POA: Diagnosis not present

## 2019-01-13 DIAGNOSIS — E785 Hyperlipidemia, unspecified: Secondary | ICD-10-CM | POA: Diagnosis not present

## 2019-01-13 DIAGNOSIS — E1122 Type 2 diabetes mellitus with diabetic chronic kidney disease: Secondary | ICD-10-CM | POA: Diagnosis not present

## 2019-01-13 DIAGNOSIS — I110 Hypertensive heart disease with heart failure: Secondary | ICD-10-CM | POA: Diagnosis not present

## 2019-01-13 DIAGNOSIS — J189 Pneumonia, unspecified organism: Secondary | ICD-10-CM | POA: Diagnosis not present

## 2019-01-13 DIAGNOSIS — E1169 Type 2 diabetes mellitus with other specified complication: Secondary | ICD-10-CM | POA: Diagnosis not present

## 2019-01-13 DIAGNOSIS — R2689 Other abnormalities of gait and mobility: Secondary | ICD-10-CM | POA: Diagnosis not present

## 2019-02-09 ENCOUNTER — Ambulatory Visit: Payer: Medicare Other | Admitting: Podiatry

## 2019-02-10 DIAGNOSIS — E1169 Type 2 diabetes mellitus with other specified complication: Secondary | ICD-10-CM | POA: Diagnosis not present

## 2019-02-10 DIAGNOSIS — I10 Essential (primary) hypertension: Secondary | ICD-10-CM | POA: Diagnosis not present

## 2019-02-14 DIAGNOSIS — E1169 Type 2 diabetes mellitus with other specified complication: Secondary | ICD-10-CM | POA: Diagnosis not present

## 2019-02-14 DIAGNOSIS — I10 Essential (primary) hypertension: Secondary | ICD-10-CM | POA: Diagnosis not present

## 2019-02-14 DIAGNOSIS — G4709 Other insomnia: Secondary | ICD-10-CM | POA: Diagnosis not present

## 2019-02-21 DIAGNOSIS — E1169 Type 2 diabetes mellitus with other specified complication: Secondary | ICD-10-CM | POA: Diagnosis not present

## 2019-02-21 DIAGNOSIS — G4709 Other insomnia: Secondary | ICD-10-CM | POA: Diagnosis not present

## 2019-03-05 DIAGNOSIS — I10 Essential (primary) hypertension: Secondary | ICD-10-CM | POA: Diagnosis not present

## 2019-03-05 DIAGNOSIS — E1169 Type 2 diabetes mellitus with other specified complication: Secondary | ICD-10-CM | POA: Diagnosis not present

## 2019-03-09 DIAGNOSIS — E119 Type 2 diabetes mellitus without complications: Secondary | ICD-10-CM | POA: Diagnosis not present

## 2019-03-10 DIAGNOSIS — J189 Pneumonia, unspecified organism: Secondary | ICD-10-CM | POA: Diagnosis not present

## 2019-03-10 DIAGNOSIS — E1122 Type 2 diabetes mellitus with diabetic chronic kidney disease: Secondary | ICD-10-CM | POA: Diagnosis not present

## 2019-03-22 DIAGNOSIS — I110 Hypertensive heart disease with heart failure: Secondary | ICD-10-CM | POA: Diagnosis not present

## 2019-03-22 DIAGNOSIS — E785 Hyperlipidemia, unspecified: Secondary | ICD-10-CM | POA: Diagnosis not present

## 2019-03-22 DIAGNOSIS — R2689 Other abnormalities of gait and mobility: Secondary | ICD-10-CM | POA: Diagnosis not present

## 2019-03-22 DIAGNOSIS — E1169 Type 2 diabetes mellitus with other specified complication: Secondary | ICD-10-CM | POA: Diagnosis not present

## 2019-03-23 DIAGNOSIS — R2689 Other abnormalities of gait and mobility: Secondary | ICD-10-CM | POA: Diagnosis not present

## 2019-03-23 DIAGNOSIS — E785 Hyperlipidemia, unspecified: Secondary | ICD-10-CM | POA: Diagnosis not present

## 2019-03-23 DIAGNOSIS — I110 Hypertensive heart disease with heart failure: Secondary | ICD-10-CM | POA: Diagnosis not present

## 2019-03-23 DIAGNOSIS — E1169 Type 2 diabetes mellitus with other specified complication: Secondary | ICD-10-CM | POA: Diagnosis not present

## 2019-03-24 DIAGNOSIS — R2689 Other abnormalities of gait and mobility: Secondary | ICD-10-CM | POA: Diagnosis not present

## 2019-03-24 DIAGNOSIS — I110 Hypertensive heart disease with heart failure: Secondary | ICD-10-CM | POA: Diagnosis not present

## 2019-03-24 DIAGNOSIS — E785 Hyperlipidemia, unspecified: Secondary | ICD-10-CM | POA: Diagnosis not present

## 2019-03-24 DIAGNOSIS — E1169 Type 2 diabetes mellitus with other specified complication: Secondary | ICD-10-CM | POA: Diagnosis not present

## 2019-03-25 DIAGNOSIS — E785 Hyperlipidemia, unspecified: Secondary | ICD-10-CM | POA: Diagnosis not present

## 2019-03-25 DIAGNOSIS — I110 Hypertensive heart disease with heart failure: Secondary | ICD-10-CM | POA: Diagnosis not present

## 2019-03-25 DIAGNOSIS — E1169 Type 2 diabetes mellitus with other specified complication: Secondary | ICD-10-CM | POA: Diagnosis not present

## 2019-03-25 DIAGNOSIS — R2689 Other abnormalities of gait and mobility: Secondary | ICD-10-CM | POA: Diagnosis not present

## 2019-03-26 DIAGNOSIS — E785 Hyperlipidemia, unspecified: Secondary | ICD-10-CM | POA: Diagnosis not present

## 2019-03-26 DIAGNOSIS — I110 Hypertensive heart disease with heart failure: Secondary | ICD-10-CM | POA: Diagnosis not present

## 2019-03-26 DIAGNOSIS — E1169 Type 2 diabetes mellitus with other specified complication: Secondary | ICD-10-CM | POA: Diagnosis not present

## 2019-03-26 DIAGNOSIS — R2689 Other abnormalities of gait and mobility: Secondary | ICD-10-CM | POA: Diagnosis not present

## 2019-03-29 DIAGNOSIS — E1169 Type 2 diabetes mellitus with other specified complication: Secondary | ICD-10-CM | POA: Diagnosis not present

## 2019-03-29 DIAGNOSIS — I110 Hypertensive heart disease with heart failure: Secondary | ICD-10-CM | POA: Diagnosis not present

## 2019-03-29 DIAGNOSIS — E785 Hyperlipidemia, unspecified: Secondary | ICD-10-CM | POA: Diagnosis not present

## 2019-03-29 DIAGNOSIS — R2689 Other abnormalities of gait and mobility: Secondary | ICD-10-CM | POA: Diagnosis not present

## 2019-03-30 DIAGNOSIS — E1169 Type 2 diabetes mellitus with other specified complication: Secondary | ICD-10-CM | POA: Diagnosis not present

## 2019-03-30 DIAGNOSIS — E785 Hyperlipidemia, unspecified: Secondary | ICD-10-CM | POA: Diagnosis not present

## 2019-03-30 DIAGNOSIS — R2689 Other abnormalities of gait and mobility: Secondary | ICD-10-CM | POA: Diagnosis not present

## 2019-03-30 DIAGNOSIS — I110 Hypertensive heart disease with heart failure: Secondary | ICD-10-CM | POA: Diagnosis not present

## 2019-03-31 DIAGNOSIS — E1169 Type 2 diabetes mellitus with other specified complication: Secondary | ICD-10-CM | POA: Diagnosis not present

## 2019-03-31 DIAGNOSIS — R2689 Other abnormalities of gait and mobility: Secondary | ICD-10-CM | POA: Diagnosis not present

## 2019-03-31 DIAGNOSIS — E785 Hyperlipidemia, unspecified: Secondary | ICD-10-CM | POA: Diagnosis not present

## 2019-03-31 DIAGNOSIS — I110 Hypertensive heart disease with heart failure: Secondary | ICD-10-CM | POA: Diagnosis not present

## 2019-04-01 ENCOUNTER — Other Ambulatory Visit: Payer: Self-pay

## 2019-04-01 DIAGNOSIS — D649 Anemia, unspecified: Secondary | ICD-10-CM | POA: Diagnosis not present

## 2019-04-01 DIAGNOSIS — R2689 Other abnormalities of gait and mobility: Secondary | ICD-10-CM | POA: Diagnosis not present

## 2019-04-01 DIAGNOSIS — I110 Hypertensive heart disease with heart failure: Secondary | ICD-10-CM | POA: Diagnosis not present

## 2019-04-01 DIAGNOSIS — E1169 Type 2 diabetes mellitus with other specified complication: Secondary | ICD-10-CM | POA: Diagnosis not present

## 2019-04-01 DIAGNOSIS — E119 Type 2 diabetes mellitus without complications: Secondary | ICD-10-CM | POA: Diagnosis not present

## 2019-04-01 DIAGNOSIS — E785 Hyperlipidemia, unspecified: Secondary | ICD-10-CM | POA: Diagnosis not present

## 2019-04-01 DIAGNOSIS — E559 Vitamin D deficiency, unspecified: Secondary | ICD-10-CM | POA: Diagnosis not present

## 2019-04-01 NOTE — Patient Outreach (Signed)
[facility]) Care Management  04/01/2019  Ricky [name] 1940-08-15 [id number]   Medication Adherence call to Ricky [name] spoke with patient ,patient said he is too busy and hung up the phone,patient did not want to engage. Ricky [name] is showing past due on Pravastatin 40 mg under [place].   [place] Management Direct Dial [phone]  Fax [phone] [name]

## 2019-04-02 DIAGNOSIS — I110 Hypertensive heart disease with heart failure: Secondary | ICD-10-CM | POA: Diagnosis not present

## 2019-04-02 DIAGNOSIS — R2689 Other abnormalities of gait and mobility: Secondary | ICD-10-CM | POA: Diagnosis not present

## 2019-04-02 DIAGNOSIS — E1169 Type 2 diabetes mellitus with other specified complication: Secondary | ICD-10-CM | POA: Diagnosis not present

## 2019-04-02 DIAGNOSIS — E785 Hyperlipidemia, unspecified: Secondary | ICD-10-CM | POA: Diagnosis not present

## 2019-04-05 DIAGNOSIS — E785 Hyperlipidemia, unspecified: Secondary | ICD-10-CM | POA: Diagnosis not present

## 2019-04-05 DIAGNOSIS — E1169 Type 2 diabetes mellitus with other specified complication: Secondary | ICD-10-CM | POA: Diagnosis not present

## 2019-04-05 DIAGNOSIS — I110 Hypertensive heart disease with heart failure: Secondary | ICD-10-CM | POA: Diagnosis not present

## 2019-04-05 DIAGNOSIS — R2689 Other abnormalities of gait and mobility: Secondary | ICD-10-CM | POA: Diagnosis not present

## 2019-04-06 DIAGNOSIS — I110 Hypertensive heart disease with heart failure: Secondary | ICD-10-CM | POA: Diagnosis not present

## 2019-04-06 DIAGNOSIS — E1169 Type 2 diabetes mellitus with other specified complication: Secondary | ICD-10-CM | POA: Diagnosis not present

## 2019-04-06 DIAGNOSIS — R2689 Other abnormalities of gait and mobility: Secondary | ICD-10-CM | POA: Diagnosis not present

## 2019-04-06 DIAGNOSIS — E785 Hyperlipidemia, unspecified: Secondary | ICD-10-CM | POA: Diagnosis not present

## 2019-04-07 DIAGNOSIS — R2689 Other abnormalities of gait and mobility: Secondary | ICD-10-CM | POA: Diagnosis not present

## 2019-04-07 DIAGNOSIS — I110 Hypertensive heart disease with heart failure: Secondary | ICD-10-CM | POA: Diagnosis not present

## 2019-04-07 DIAGNOSIS — E1169 Type 2 diabetes mellitus with other specified complication: Secondary | ICD-10-CM | POA: Diagnosis not present

## 2019-04-07 DIAGNOSIS — E785 Hyperlipidemia, unspecified: Secondary | ICD-10-CM | POA: Diagnosis not present

## 2019-04-08 DIAGNOSIS — E1169 Type 2 diabetes mellitus with other specified complication: Secondary | ICD-10-CM | POA: Diagnosis not present

## 2019-04-08 DIAGNOSIS — E785 Hyperlipidemia, unspecified: Secondary | ICD-10-CM | POA: Diagnosis not present

## 2019-04-08 DIAGNOSIS — R2689 Other abnormalities of gait and mobility: Secondary | ICD-10-CM | POA: Diagnosis not present

## 2019-04-08 DIAGNOSIS — I110 Hypertensive heart disease with heart failure: Secondary | ICD-10-CM | POA: Diagnosis not present

## 2019-04-09 DIAGNOSIS — R2689 Other abnormalities of gait and mobility: Secondary | ICD-10-CM | POA: Diagnosis not present

## 2019-04-09 DIAGNOSIS — I110 Hypertensive heart disease with heart failure: Secondary | ICD-10-CM | POA: Diagnosis not present

## 2019-04-09 DIAGNOSIS — E785 Hyperlipidemia, unspecified: Secondary | ICD-10-CM | POA: Diagnosis not present

## 2019-04-09 DIAGNOSIS — E1169 Type 2 diabetes mellitus with other specified complication: Secondary | ICD-10-CM | POA: Diagnosis not present

## 2019-04-10 DIAGNOSIS — K5909 Other constipation: Secondary | ICD-10-CM | POA: Diagnosis not present

## 2019-04-10 DIAGNOSIS — E1169 Type 2 diabetes mellitus with other specified complication: Secondary | ICD-10-CM | POA: Diagnosis not present

## 2019-04-12 DIAGNOSIS — I110 Hypertensive heart disease with heart failure: Secondary | ICD-10-CM | POA: Diagnosis not present

## 2019-04-12 DIAGNOSIS — R2689 Other abnormalities of gait and mobility: Secondary | ICD-10-CM | POA: Diagnosis not present

## 2019-04-12 DIAGNOSIS — E1169 Type 2 diabetes mellitus with other specified complication: Secondary | ICD-10-CM | POA: Diagnosis not present

## 2019-04-12 DIAGNOSIS — E785 Hyperlipidemia, unspecified: Secondary | ICD-10-CM | POA: Diagnosis not present

## 2019-04-13 DIAGNOSIS — I110 Hypertensive heart disease with heart failure: Secondary | ICD-10-CM | POA: Diagnosis not present

## 2019-04-13 DIAGNOSIS — E1169 Type 2 diabetes mellitus with other specified complication: Secondary | ICD-10-CM | POA: Diagnosis not present

## 2019-04-13 DIAGNOSIS — E785 Hyperlipidemia, unspecified: Secondary | ICD-10-CM | POA: Diagnosis not present

## 2019-04-13 DIAGNOSIS — R2689 Other abnormalities of gait and mobility: Secondary | ICD-10-CM | POA: Diagnosis not present

## 2019-04-14 DIAGNOSIS — E1169 Type 2 diabetes mellitus with other specified complication: Secondary | ICD-10-CM | POA: Diagnosis not present

## 2019-04-14 DIAGNOSIS — R2689 Other abnormalities of gait and mobility: Secondary | ICD-10-CM | POA: Diagnosis not present

## 2019-04-14 DIAGNOSIS — E785 Hyperlipidemia, unspecified: Secondary | ICD-10-CM | POA: Diagnosis not present

## 2019-04-14 DIAGNOSIS — I110 Hypertensive heart disease with heart failure: Secondary | ICD-10-CM | POA: Diagnosis not present

## 2019-04-15 ENCOUNTER — Ambulatory Visit (INDEPENDENT_AMBULATORY_CARE_PROVIDER_SITE_OTHER): Payer: Medicare Other | Admitting: Psychiatry

## 2019-04-15 ENCOUNTER — Encounter: Payer: Self-pay | Admitting: Psychiatry

## 2019-04-15 DIAGNOSIS — E1169 Type 2 diabetes mellitus with other specified complication: Secondary | ICD-10-CM | POA: Diagnosis not present

## 2019-04-15 DIAGNOSIS — F200 Paranoid schizophrenia: Secondary | ICD-10-CM

## 2019-04-15 DIAGNOSIS — R2689 Other abnormalities of gait and mobility: Secondary | ICD-10-CM | POA: Diagnosis not present

## 2019-04-15 DIAGNOSIS — F0281 Dementia in other diseases classified elsewhere with behavioral disturbance: Secondary | ICD-10-CM

## 2019-04-15 DIAGNOSIS — I110 Hypertensive heart disease with heart failure: Secondary | ICD-10-CM | POA: Diagnosis not present

## 2019-04-15 DIAGNOSIS — F422 Mixed obsessional thoughts and acts: Secondary | ICD-10-CM

## 2019-04-15 DIAGNOSIS — E785 Hyperlipidemia, unspecified: Secondary | ICD-10-CM | POA: Diagnosis not present

## 2019-04-15 DIAGNOSIS — G301 Alzheimer's disease with late onset: Secondary | ICD-10-CM

## 2019-04-15 DIAGNOSIS — F411 Generalized anxiety disorder: Secondary | ICD-10-CM | POA: Diagnosis not present

## 2019-04-15 DIAGNOSIS — F5105 Insomnia due to other mental disorder: Secondary | ICD-10-CM

## 2019-04-15 NOTE — Progress Notes (Signed)
Ricky [name] ES:[id number] 10-Jun-1940 79 y.o.   Virtual Visit via [place]  I connected with pt by WebEx and verified that I am speaking with the correct person using two identifiers.   I discussed the limitations, risks, security and privacy concerns of performing an evaluation and management service by Ricky [name] and the availability of in person appointments. I also discussed with the patient that there may be a patient responsible charge related to this service. The patient expressed understanding and agreed to proceed.  I discussed the assessment and treatment plan with the patient. The patient was provided an opportunity to ask questions and all were answered. The patient agreed with the plan and demonstrated an understanding of the instructions.   The patient was advised to call back or seek an in-person evaluation if the symptoms worsen or if the condition fails to improve as anticipated.  I provided 30 minutes of video time during this encounter. The call started at 1000 and ended at 10:30. The patient was located at home and the provider was located office.    Subjective:   Patient ID:  Ricky [name]. is a 80 y.o. (DOB 02-26-1940) male.  Chief Complaint:  Chief Complaint  Patient presents with  . Follow-up    Medication Management  . Schizophrenia    Medication Management  . Stress    health    Depression        Associated symptoms include decreased concentration and fatigue.  Associated symptoms include no headaches and no suicidal ideas.  Ricky [name]. presents to the office today for follow-up of schizophrenia and dementia.  Resident of [facility].  Last seen December,  2020.  No meds were changed.  Doing PT and trying to get stronger.  Sleep poor last night.  Sometimes pain is that problem and other times he doesn't know.  Upset virus interferes with activity. Doesn't like the QT restrictions. "I believe I'm getting better." Got transcripts from [facility]  and [employer] and likes reading them for reassurance and encouragement.   CO several physical problems including his WC.  Harder time getting here DT weakness.  Now urinary incontinence.  Spends a lot of time in bed.  Intrusive thoughts of needing to go to the hospital DT his mental health problems, but I know I don't really need to go.  Denies unusual anxiety.  Still worry most about relationship with God and fear of loss of salvation.  Will call organizations for prayer.  Also worry over relationships with people in NH. Sometimes emotional confusion and breakouts of worry and fear.  Says sig STM px but better LTM.  Asks about questions for reassurance. Still worries he might need to go to the hospital.    Continues to rate himself I the high 90% and asks for others to rate him too.    Still thinks God is mad at him and others also.  Able to read the Bible right now but sometimes cause paranoia.  Upset friend Ricky [name] can't visit.  STM problems.    Stressed by [employer] and not able to get out with [place].  Not depressed. Chronic worry.   Struggles with self identity and anxiety.  Still gets confused spiritually and some degree of paranoia.  Occ hears voices that tell him things to do, "I don't pay any attention to it".   Goes to bed 830.  Doesn't bother him if he lays awake at night now.  [facility] has called with  occasional behavioral disturbances.  Eats in his room.  No complaints voiced about staff or residents except one.  Past Psychiatric Medication Trials:  All of them are unknown,  haloperidol alone, serentil, Stelazine, Moban, perphenazine, risperidone, Thorazine, Seroquel 800 mg, loxapine, been on Zyprexa since September 2006 and that has produced the best control of his paranoia. benztropine,    paroxetine, sertraline side effects,   Hydroxyzine, trazodone no response,  Xanax,  Review of Systems:  Review of Systems  Constitutional: Positive for fatigue.  Genitourinary: Positive  for enuresis.  Musculoskeletal: Positive for arthralgias, back pain and gait problem.  Neurological: Positive for dizziness, tremors and weakness. Negative for headaches.  Psychiatric/Behavioral: Positive for confusion, decreased concentration, depression, hallucinations and sleep disturbance. Negative for agitation, self-injury and suicidal ideas. The patient is nervous/anxious.     Medications: I have reviewed the patient's current medications.  Current Outpatient Medications  Medication Sig Dispense Refill  . ALPRAZolam (XANAX) 0.5 MG tablet Take 1 tablet (0.5 mg total) by mouth at bedtime. 2 tablet 0  . amLODipine (NORVASC) 5 MG tablet Take 5 mg by mouth daily.    . Calcium Citrate-Vitamin D (CITRACAL + D PO) Take 1 tablet by mouth daily.    . Cholecalciferol (VITAMIN D-3) 1000 units CAPS Take 1 capsule by mouth daily.    Marland Kitchen docusate sodium (COLACE) 100 MG capsule Take 100 mg by mouth 2 (two) times daily.    . empagliflozin (JARDIANCE) 10 MG TABS tablet Take 10 mg by mouth daily. 2 tablet 0  . fluvoxaMINE (LUVOX) 100 MG tablet Take 1 tablet (100 mg total) by mouth at bedtime. 2 tablet 0  . furosemide (LASIX) 20 MG tablet Take 20 mg by mouth daily before breakfast.     . HUMALOG KWIKPEN 100 UNIT/ML KiwkPen Inject 2-11 Units into the skin See admin instructions. Times: 0630 1130 1630  Scale: 101-150: 2 units 151-200: 3 units 201-250: 5 units 251-300: 7 units 301-350: 9 units >350: 11 units    . insulin detemir (LEVEMIR) 100 UNIT/ML injection Inject 0.3 mLs (30 Units total) into the skin at bedtime. (Patient taking differently: Inject 24 Units into the skin at bedtime. ) 10 mL 11  . Menthol, Topical Analgesic, (BIOFREEZE) 4 % GEL Apply topically.    . Multiple Vitamins-Minerals (THERAGRAN-M PREMIER 50 PLUS PO) Take 1 tablet by mouth daily.    Marland Kitchen MYRBETRIQ 25 MG TB24 tablet Take 25 mg by mouth daily.     Marland Kitchen OLANZapine (ZYPREXA) 5 MG tablet Take 5 tablets (25 mg total) by mouth daily. 2  tablet 0  . omeprazole (PRILOSEC) 40 MG capsule Take 40 mg by mouth daily.     . polyethylene glycol powder (GAVILAX) 17 GM/SCOOP powder Take 1 Container by mouth once.    . pravastatin (PRAVACHOL) 40 MG tablet Take 40 mg by mouth daily after lunch.     . tamsulosin (FLOMAX) 0.4 MG CAPS capsule Take 0.4 mg by mouth daily.     Marland Kitchen zolpidem (AMBIEN) 5 MG tablet Take 1 tablet (5 mg total) by mouth at bedtime. 2 tablet 0   No current facility-administered medications for this visit.    Medication Side Effects: Other: dry mouth  Allergies:  Allergies  Allergen Reactions  . Asa [Aspirin]     "Dr told me not to take it"  . Codeine Other (See Comments)    DIZZINESS with Tylenol 3  . Tylenol [Acetaminophen] Other (See Comments)    Dizziness with tylenol 3  Past Medical History:  Diagnosis Date  . Abnormality of gait   . Adenomatous colon polyp   . Arthritis   . Cataract   . Dementia ([place])   . Depression   . Diabetes mellitus without complication ([place])   . Fatty liver   . Hyperlipidemia   . Hypertension   . Internal hemorrhoids   . Other malaise and fatigue   . Peripheral edema   . Schizophrenia ([place])   . Type II or unspecified type diabetes mellitus without mention of complication, not stated as uncontrolled   . Urinary frequency   . Urinary retention     Family History  Problem Relation Age of Onset  . Brain cancer Father     Social History   Socioeconomic History  . Marital status: Single    Spouse name: Not on file  . Number of children: Not on file  . Years of education: college  . Highest education level: Not on file  Occupational History    Employer: RETIRED    Comment: Disabled  Tobacco Use  . Smoking status: Never Smoker  . Smokeless tobacco: Never Used  Substance and Sexual Activity  . Alcohol use: No  . Drug use: No  . Sexual activity: Not on file  Other Topics Concern  . Not on file  Social History Narrative   Patient is disabled and he has not  worked since 1962. Patient has some college education.Patient drinks 2 cups of coffee daily.   Right handed.   Social Determinants of Health   Financial Resource Strain:   . Difficulty of Paying Living Expenses:   Food Insecurity:   . Worried About Charity fundraiser in the Last Year:   . Arboriculturist in the Last Year:   Transportation Needs:   . Film/video editor (Medical):   Marland Kitchen Lack of Transportation (Non-Medical):   Physical Activity:   . Days of Exercise per Week:   . Minutes of Exercise per Session:   Stress:   . Feeling of Stress :   Social Connections:   . Frequency of Communication with Friends and Family:   . Frequency of Social Gatherings with Friends and Family:   . Attends Religious Services:   . Active Member of Clubs or Organizations:   . Attends Archivist Meetings:   Marland Kitchen Marital Status:   Intimate Partner Violence:   . Fear of Current or Ex-Partner:   . Emotionally Abused:   Marland Kitchen Physically Abused:   . Sexually Abused:     Past Medical History, Surgical history, Social history, and Family history were reviewed and updated as appropriate.   Please see review of systems for further details on the patient's review from today.   Objective:   Physical Exam:  There were no vitals taken for this visit.  Physical Exam Neurological:     Mental Status: He is alert and oriented to person, place, and time.     Cranial Nerves: Dysarthria present.     Comments: Mild dysarthria chronic   Psychiatric:        Attention and Perception: Attention normal. He is attentive. He perceives auditory hallucinations. He does not perceive visual hallucinations.        Mood and Affect: Mood is anxious. Mood is not depressed. Affect is not angry or tearful.        Speech: Speech normal. Speech is not rapid and pressured or slurred.        Behavior: Behavior  is slowed. Behavior is not agitated or aggressive. Behavior is cooperative.        Thought Content: Thought  content is paranoid and delusional. Thought content does not include homicidal or suicidal ideation. Thought content does not include homicidal or suicidal plan.        Cognition and Memory: Cognition is impaired. Memory is impaired. He does not exhibit impaired recent memory.     Comments: Ignores voices. Fair insight and judgment. Talkative and repeats himself some. Needy for reassurance as usual but has trouble articulating what he needs exactly. Some IOR re commericials on TV. Obsessive thought and compulsive behaviors are prominent but manageable.     Lab Review:     Component Value Date/Time   NA 139 01/22/2018 0339   K 3.6 01/22/2018 0339   CL 100 01/22/2018 0339   CO2 28 01/22/2018 0339   GLUCOSE 231 (H) 01/22/2018 0339   BUN 15 01/22/2018 0339   CREATININE 0.96 01/22/2018 0339   CALCIUM 9.2 01/22/2018 0339   PROT 7.0 01/22/2018 0339   ALBUMIN 3.3 (L) 01/22/2018 0339   AST 35 01/22/2018 0339   ALT 38 01/22/2018 0339   ALKPHOS 71 01/22/2018 0339   BILITOT 0.6 01/22/2018 0339   GFRNONAA >60 01/22/2018 0339   GFRAA >60 01/22/2018 0339       Component Value Date/Time   WBC 6.8 01/22/2018 0339   RBC 4.48 01/22/2018 0339   HGB 12.9 (L) 01/22/2018 0339   HCT 41.7 01/22/2018 0339   PLT 211 01/22/2018 0339   MCV 93.1 01/22/2018 0339   MCH 28.8 01/22/2018 0339   MCHC 30.9 01/22/2018 0339   RDW 14.0 01/22/2018 0339   LYMPHSABS 1.8 01/22/2018 0339   MONOABS 0.6 01/22/2018 0339   EOSABS 0.3 01/22/2018 0339   BASOSABS 0.1 01/22/2018 0339    No results found for: POCLITH, LITHIUM   No results found for: PHENYTOIN, PHENOBARB, VALPROATE, CBMZ   .res Assessment: Plan:    Damarien was seen today for follow-up, schizophrenia and stress.  Diagnoses and all orders for this visit:  Schizophrenia, paranoid ([place])  Mixed obsessional thoughts and acts  Generalized anxiety disorder  Late onset Alzheimer's disease with behavioral disturbance ([place])  Insomnia due to mental  condition  Patient has been under my psychiatric years since 1998 .unlikely that med change will reduce the paranoia and auditory hallucinations which is chronic.  It is no worse than last visit.  He has been on multiple psychiatric medications and has done best on this combination of Zyprexa which was recently increased to 25 mg April 09, 2018, a minimal dose of alprazolam, and fluvoxamine.  Chronic paranoia without change.  Is cooperative genereally.   He also still has obsessive and intrusive fears about losing his salvation but they are chronic and have not been easily managed.  Given his age and the chronicity of the symptoms med changes are unlikely to help.  Weaker and needing more physical assistance.    Supportive therapy dealing with chronic paranoia and isolation from Covid.  Encourage his spirituality as a coping mechanism.  Needs a lot of encouragement.  Repetitively asks for reassurance. Needs reassurance he does not need to go to the hospital.  Discussed potential metabolic side effects associated with atypical antipsychotics, as well as potential risk for movement side effects. Advised pt to contact office if movement side effects occur.   Disc he's taking more than the usuall highest dose of olanzapine but is medically necessary for paranoia.  Cognition  has declined according to [facility] staff.  MAR was not received today.  It would be best for him to take minimal number of medications at night that could have effects on his cognition but because of his frequent complaints of insomnia we can probably not stop any of those meds at this time.  No med changes today.  Except recommend stop hydroxyZIne bc of cognitive risks.  25 min appt  FU 4 mos  Lynder Parents, MD, DFAPA .    Future Appointments  Date Time Provider [place]  05/25/2019  1:15 PM Gardiner Barefoot, DPM [facility]    No orders of the defined types were placed in this encounter.      -------------------------------

## 2019-04-16 DIAGNOSIS — R2689 Other abnormalities of gait and mobility: Secondary | ICD-10-CM | POA: Diagnosis not present

## 2019-04-16 DIAGNOSIS — E1169 Type 2 diabetes mellitus with other specified complication: Secondary | ICD-10-CM | POA: Diagnosis not present

## 2019-04-16 DIAGNOSIS — E785 Hyperlipidemia, unspecified: Secondary | ICD-10-CM | POA: Diagnosis not present

## 2019-04-16 DIAGNOSIS — I110 Hypertensive heart disease with heart failure: Secondary | ICD-10-CM | POA: Diagnosis not present

## 2019-04-19 ENCOUNTER — Ambulatory Visit: Payer: Medicare Other | Admitting: Podiatry

## 2019-04-19 DIAGNOSIS — I110 Hypertensive heart disease with heart failure: Secondary | ICD-10-CM | POA: Diagnosis not present

## 2019-04-19 DIAGNOSIS — R2689 Other abnormalities of gait and mobility: Secondary | ICD-10-CM | POA: Diagnosis not present

## 2019-04-19 DIAGNOSIS — E1169 Type 2 diabetes mellitus with other specified complication: Secondary | ICD-10-CM | POA: Diagnosis not present

## 2019-04-19 DIAGNOSIS — E785 Hyperlipidemia, unspecified: Secondary | ICD-10-CM | POA: Diagnosis not present

## 2019-04-20 DIAGNOSIS — R2689 Other abnormalities of gait and mobility: Secondary | ICD-10-CM | POA: Diagnosis not present

## 2019-04-20 DIAGNOSIS — I110 Hypertensive heart disease with heart failure: Secondary | ICD-10-CM | POA: Diagnosis not present

## 2019-04-20 DIAGNOSIS — E1169 Type 2 diabetes mellitus with other specified complication: Secondary | ICD-10-CM | POA: Diagnosis not present

## 2019-04-20 DIAGNOSIS — E785 Hyperlipidemia, unspecified: Secondary | ICD-10-CM | POA: Diagnosis not present

## 2019-04-21 DIAGNOSIS — E1169 Type 2 diabetes mellitus with other specified complication: Secondary | ICD-10-CM | POA: Diagnosis not present

## 2019-04-21 DIAGNOSIS — E785 Hyperlipidemia, unspecified: Secondary | ICD-10-CM | POA: Diagnosis not present

## 2019-04-21 DIAGNOSIS — I110 Hypertensive heart disease with heart failure: Secondary | ICD-10-CM | POA: Diagnosis not present

## 2019-04-21 DIAGNOSIS — R2689 Other abnormalities of gait and mobility: Secondary | ICD-10-CM | POA: Diagnosis not present

## 2019-04-22 DIAGNOSIS — E785 Hyperlipidemia, unspecified: Secondary | ICD-10-CM | POA: Diagnosis not present

## 2019-04-22 DIAGNOSIS — I110 Hypertensive heart disease with heart failure: Secondary | ICD-10-CM | POA: Diagnosis not present

## 2019-04-22 DIAGNOSIS — E1169 Type 2 diabetes mellitus with other specified complication: Secondary | ICD-10-CM | POA: Diagnosis not present

## 2019-04-22 DIAGNOSIS — R2689 Other abnormalities of gait and mobility: Secondary | ICD-10-CM | POA: Diagnosis not present

## 2019-04-23 DIAGNOSIS — R2689 Other abnormalities of gait and mobility: Secondary | ICD-10-CM | POA: Diagnosis not present

## 2019-04-23 DIAGNOSIS — E1169 Type 2 diabetes mellitus with other specified complication: Secondary | ICD-10-CM | POA: Diagnosis not present

## 2019-04-23 DIAGNOSIS — I110 Hypertensive heart disease with heart failure: Secondary | ICD-10-CM | POA: Diagnosis not present

## 2019-04-23 DIAGNOSIS — E785 Hyperlipidemia, unspecified: Secondary | ICD-10-CM | POA: Diagnosis not present

## 2019-05-05 DIAGNOSIS — E1122 Type 2 diabetes mellitus with diabetic chronic kidney disease: Secondary | ICD-10-CM | POA: Diagnosis not present

## 2019-05-05 DIAGNOSIS — J189 Pneumonia, unspecified organism: Secondary | ICD-10-CM | POA: Diagnosis not present

## 2019-05-21 DIAGNOSIS — Z5181 Encounter for therapeutic drug level monitoring: Secondary | ICD-10-CM | POA: Diagnosis not present

## 2019-05-21 DIAGNOSIS — Z79899 Other long term (current) drug therapy: Secondary | ICD-10-CM | POA: Diagnosis not present

## 2019-05-21 DIAGNOSIS — I509 Heart failure, unspecified: Secondary | ICD-10-CM | POA: Diagnosis not present

## 2019-05-23 DIAGNOSIS — Z03818 Encounter for observation for suspected exposure to other biological agents ruled out: Secondary | ICD-10-CM | POA: Diagnosis not present

## 2019-05-25 ENCOUNTER — Ambulatory Visit: Payer: Medicare Other | Admitting: Podiatry

## 2019-05-25 DIAGNOSIS — E1169 Type 2 diabetes mellitus with other specified complication: Secondary | ICD-10-CM | POA: Diagnosis not present

## 2019-05-25 DIAGNOSIS — I10 Essential (primary) hypertension: Secondary | ICD-10-CM | POA: Diagnosis not present

## 2019-05-25 DIAGNOSIS — E785 Hyperlipidemia, unspecified: Secondary | ICD-10-CM | POA: Diagnosis not present

## 2019-06-03 ENCOUNTER — Encounter: Payer: Self-pay | Admitting: Psychiatry

## 2019-06-03 ENCOUNTER — Other Ambulatory Visit: Payer: Self-pay

## 2019-06-03 ENCOUNTER — Ambulatory Visit (INDEPENDENT_AMBULATORY_CARE_PROVIDER_SITE_OTHER): Payer: Medicare Other | Admitting: Psychiatry

## 2019-06-03 DIAGNOSIS — F411 Generalized anxiety disorder: Secondary | ICD-10-CM | POA: Diagnosis not present

## 2019-06-03 DIAGNOSIS — G301 Alzheimer's disease with late onset: Secondary | ICD-10-CM

## 2019-06-03 DIAGNOSIS — F422 Mixed obsessional thoughts and acts: Secondary | ICD-10-CM | POA: Diagnosis not present

## 2019-06-03 DIAGNOSIS — F5105 Insomnia due to other mental disorder: Secondary | ICD-10-CM

## 2019-06-03 DIAGNOSIS — F0281 Dementia in other diseases classified elsewhere with behavioral disturbance: Secondary | ICD-10-CM

## 2019-06-03 DIAGNOSIS — F200 Paranoid schizophrenia: Secondary | ICD-10-CM | POA: Diagnosis not present

## 2019-06-03 NOTE — Progress Notes (Signed)
Ricky [name] [id number] November 11, 1940 79 y.o.     Subjective:   Patient ID:  Ricky [name]. is a 79 y.o. (DOB 10/05/1940) male.  Chief Complaint:  Chief Complaint  Patient presents with  . Follow-up  . Anxiety  . Paranoid    Depression        Associated symptoms include decreased concentration and fatigue.  Associated symptoms include no headaches and no suicidal ideas.  Ricky [name]. presents to the office today for follow-up of schizophrenia and dementia.  Resident of [facility].  Last seen April 15, 2019.  No meds were changed.  As of 06/03/2019 the following is noted: Staff reports patient calls very frequently and has for an extended period of time.  He is chronically somewhat needy and lonely as well as has a tendency for reassurance checking. Still bothered by religious thoughts and can't tell if it's from God than or Satan.  Supernatural things get in his mind.  Also thinks that people there don't like him at [facility].  Also upset at Biden's spending excess.  Wants to vote for [name].  Also bothered he doesn't get to do much.    Had thoughts he needed to go to the hospital one night for [place] reasons but staff encouraged him to wait it out and he felt better the next day. Poor STM.  Doing PT and trying to get stronger.  Sleep poor at times.  Sometimes pain is that problem and other times he doesn't know.  Upset virus interferes with activity. Doesn't like the QT restrictions. "I believe I'm getting better." Got transcripts from [facility] and [employer] and likes reading them for reassurance and encouragement.   CO several physical problems including his WC.  Harder time getting here DT weakness.  Now urinary incontinence.  Spends a lot of time in bed.  Intrusive thoughts of needing to go to the hospital DT his mental health problems, but I know I don't really need to go.  Continues to rate himself I the high 90% and asks for others to rate him  too.    Still thinks God is mad at him and others also.  Able to read the Bible right now but sometimes cause paranoia.  Upset friend Ricky [name] can't visit.  STM problems.    Stressed by [employer] and not able to get out with [place].  Not depressed. Chronic worry.   Struggles with self identity and anxiety.  Still gets confused spiritually and some degree of paranoia.  Occ hears voices that tell him things to do, "I don't pay any attention to it".   Goes to bed 830.  Doesn't bother him if he lays awake at night now.  [facility] has called with occasional behavioral disturbances.  Eats in his room.  No complaints voiced about staff or residents except one.  Past Psychiatric Medication Trials:  All of them are unknown,  haloperidol alone, serentil, Stelazine, Moban, perphenazine, risperidone, Thorazine, Seroquel 800 mg, loxapine, been on Zyprexa since September 2006 and that has produced the best control of his paranoia at 25 mg daily. benztropine,    paroxetine, sertraline side effects,   Hydroxyzine, trazodone no response,  Xanax,  Review of Systems:  Review of Systems  Constitutional: Positive for fatigue.  Cardiovascular: Negative for chest pain.  Genitourinary: Positive for enuresis.  Musculoskeletal: Positive for arthralgias, back pain and gait problem.  Neurological: Positive for dizziness, tremors and weakness. Negative for headaches.  Psychiatric/Behavioral: Positive for  confusion, decreased concentration, depression, hallucinations and sleep disturbance. Negative for agitation, self-injury and suicidal ideas. The patient is nervous/anxious.     Medications: I have reviewed the patient's current medications.  Current Outpatient Medications  Medication Sig Dispense Refill  . ALPRAZolam (XANAX) 0.5 MG tablet Take 1 tablet (0.5 mg total) by mouth at bedtime. 2 tablet 0  . amLODipine (NORVASC) 5 MG tablet Take 5 mg by mouth daily.    . Calcium Citrate-Vitamin D (CITRACAL + D PO) Take 1  tablet by mouth daily.    . Cholecalciferol (VITAMIN D-3) 1000 units CAPS Take 1 capsule by mouth daily.    Marland Kitchen docusate sodium (COLACE) 100 MG capsule Take 100 mg by mouth 2 (two) times daily.    . empagliflozin (JARDIANCE) 10 MG TABS tablet Take 10 mg by mouth daily. 2 tablet 0  . fluvoxaMINE (LUVOX) 100 MG tablet Take 1 tablet (100 mg total) by mouth at bedtime. 2 tablet 0  . furosemide (LASIX) 20 MG tablet Take 20 mg by mouth daily before breakfast.     . HUMALOG KWIKPEN 100 UNIT/ML KiwkPen Inject 2-11 Units into the skin See admin instructions. Times: 0630 1130 1630  Scale: 101-150: 2 units 151-200: 3 units 201-250: 5 units 251-300: 7 units 301-350: 9 units >350: 11 units    . insulin detemir (LEVEMIR) 100 UNIT/ML injection Inject 0.3 mLs (30 Units total) into the skin at bedtime. (Patient taking differently: Inject 24 Units into the skin at bedtime. ) 10 mL 11  . Menthol, Topical Analgesic, (BIOFREEZE) 4 % GEL Apply topically.    . Multiple Vitamins-Minerals (THERAGRAN-M PREMIER 50 PLUS PO) Take 1 tablet by mouth daily.    Marland Kitchen MYRBETRIQ 25 MG TB24 tablet Take 25 mg by mouth daily.     Marland Kitchen OLANZapine (ZYPREXA) 5 MG tablet Take 5 tablets (25 mg total) by mouth daily. 2 tablet 0  . omeprazole (PRILOSEC) 40 MG capsule Take 40 mg by mouth daily.     . polyethylene glycol powder (GAVILAX) 17 GM/SCOOP powder Take 1 Container by mouth once.    . pravastatin (PRAVACHOL) 40 MG tablet Take 40 mg by mouth daily after lunch.     . tamsulosin (FLOMAX) 0.4 MG CAPS capsule Take 0.4 mg by mouth daily.     Marland Kitchen zolpidem (AMBIEN) 5 MG tablet Take 1 tablet (5 mg total) by mouth at bedtime. 2 tablet 0   No current facility-administered medications for this visit.    Medication Side Effects: Other: dry mouth  Allergies:  Allergies  Allergen Reactions  . Asa [Aspirin]     "Dr told me not to take it"  . Codeine Other (See Comments)    DIZZINESS with Tylenol 3  . Tylenol [Acetaminophen] Other (See  Comments)    Dizziness with tylenol 3    Past Medical History:  Diagnosis Date  . Abnormality of gait   . Adenomatous colon polyp   . Arthritis   . Cataract   . Dementia ([place])   . Depression   . Diabetes mellitus without complication ([place])   . Fatty liver   . Hyperlipidemia   . Hypertension   . Internal hemorrhoids   . Other malaise and fatigue   . Peripheral edema   . Schizophrenia ([place])   . Type II or unspecified type diabetes mellitus without mention of complication, not stated as uncontrolled   . Urinary frequency   . Urinary retention     Family History  Problem Relation Age of Onset  .  Brain cancer Father     Social History   Socioeconomic History  . Marital status: Single    Spouse name: Not on file  . Number of children: Not on file  . Years of education: college  . Highest education level: Not on file  Occupational History    Employer: RETIRED    Comment: Disabled  Tobacco Use  . Smoking status: Never Smoker  . Smokeless tobacco: Never Used  Substance and Sexual Activity  . Alcohol use: No  . Drug use: No  . Sexual activity: Not on file  Other Topics Concern  . Not on file  Social History Narrative   Patient is disabled and he has not worked since 1962. Patient has some college education.Patient drinks 2 cups of coffee daily.   Right handed.   Social Determinants of Health   Financial Resource Strain:   . Difficulty of Paying Living Expenses:   Food Insecurity:   . Worried About Charity fundraiser in the Last Year:   . Arboriculturist in the Last Year:   Transportation Needs:   . Film/video editor (Medical):   Marland Kitchen Lack of Transportation (Non-Medical):   Physical Activity:   . Days of Exercise per Week:   . Minutes of Exercise per Session:   Stress:   . Feeling of Stress :   Social Connections:   . Frequency of Communication with Friends and Family:   . Frequency of Social Gatherings with Friends and Family:   . Attends Religious  Services:   . Active Member of Clubs or Organizations:   . Attends Archivist Meetings:   Marland Kitchen Marital Status:   Intimate Partner Violence:   . Fear of Current or Ex-Partner:   . Emotionally Abused:   Marland Kitchen Physically Abused:   . Sexually Abused:     Past Medical History, Surgical history, Social history, and Family history were reviewed and updated as appropriate.   Please see review of systems for further details on the patient's review from today.   Objective:   Physical Exam:  There were no vitals taken for this visit.  Physical Exam Constitutional:      Appearance: He is obese.  Neurological:     Mental Status: He is alert and oriented to person, place, and time.     Cranial Nerves: Dysarthria present.     Motor: Weakness present.     Gait: Gait abnormal.     Comments: Mild dysarthria chronic   Psychiatric:        Attention and Perception: Attention normal. He is attentive. He perceives auditory hallucinations. He does not perceive visual hallucinations.        Mood and Affect: Mood is anxious. Mood is not depressed. Affect is not angry or tearful.        Speech: Speech normal. Speech is not rapid and pressured or slurred.        Behavior: Behavior is slowed. Behavior is not agitated or aggressive. Behavior is cooperative.        Thought Content: Thought content is paranoid and delusional. Thought content does not include homicidal or suicidal ideation. Thought content does not include homicidal or suicidal plan.        Cognition and Memory: Cognition is impaired. Memory is impaired. He does not exhibit impaired recent memory.     Comments: Ignores voices. Fair insight and judgment. Talkative and repeats himself some. Needy for reassurance as usual but has trouble articulating what he  needs exactly. Some IOR re commericials on TV. Obsessive thought and compulsive behaviors are prominent but manageable.     Lab Review:     Component Value Date/Time   NA 139  01/22/2018 0339   K 3.6 01/22/2018 0339   CL 100 01/22/2018 0339   CO2 28 01/22/2018 0339   GLUCOSE 231 (H) 01/22/2018 0339   BUN 15 01/22/2018 0339   CREATININE 0.96 01/22/2018 0339   CALCIUM 9.2 01/22/2018 0339   PROT 7.0 01/22/2018 0339   ALBUMIN 3.3 (L) 01/22/2018 0339   AST 35 01/22/2018 0339   ALT 38 01/22/2018 0339   ALKPHOS 71 01/22/2018 0339   BILITOT 0.6 01/22/2018 0339   GFRNONAA >60 01/22/2018 0339   GFRAA >60 01/22/2018 0339       Component Value Date/Time   WBC 6.8 01/22/2018 0339   RBC 4.48 01/22/2018 0339   HGB 12.9 (L) 01/22/2018 0339   HCT 41.7 01/22/2018 0339   PLT 211 01/22/2018 0339   MCV 93.1 01/22/2018 0339   MCH 28.8 01/22/2018 0339   MCHC 30.9 01/22/2018 0339   RDW 14.0 01/22/2018 0339   LYMPHSABS 1.8 01/22/2018 0339   MONOABS 0.6 01/22/2018 0339   EOSABS 0.3 01/22/2018 0339   BASOSABS 0.1 01/22/2018 0339    No results found for: POCLITH, LITHIUM   No results found for: PHENYTOIN, PHENOBARB, VALPROATE, CBMZ   .res Assessment: Plan:    Yoskar was seen today for follow-up, anxiety and paranoid.  Diagnoses and all orders for this visit:  Schizophrenia, paranoid ([place])  Mixed obsessional thoughts and acts  Generalized anxiety disorder  Late onset Alzheimer's disease with behavioral disturbance ([place])  Insomnia due to mental condition   Reviewed documents today from [facility].   Patient has been under my psychiatric years since 1998 .unlikely that med change will reduce the paranoia and auditory hallucinations which is chronic.  It is no worse than last visit but is chronic invlolving God and staff.  Reports still cooperative with staff.  He has been on multiple psychiatric medications and has done best on this combination of Zyprexa which was recently increased to 25 mg April 09, 2018, a minimal dose of alprazolam, and fluvoxamine.  Chronic paranoia without change.  Is cooperative genereally.   He also still has obsessive and  intrusive fears about losing his salvation but they are chronic and have not been easily managed.  Given his age and the chronicity of the symptoms med changes are unlikely to help.  Consider increasing fluvoxamine to see if it will help more with this.  Weaker and needing more physical assistance.    Supportive therapy dealing with chronic paranoia and isolation from Covid.  Encourage his spirituality as a coping mechanism.  Needs a lot of encouragement.  Repetitively asks for reassurance. Needs reassurance he does not need to go to the hospital.  Discussed potential metabolic side effects associated with atypical antipsychotics, as well as potential risk for movement side effects. Advised pt to contact office if movement side effects occur.   Disc he's taking more than the usuall highest dose of olanzapine but is medically necessary for paranoia.  No med changes today.  Except recommend stop hydroxyZIne bc of cognitive risks if possible.  He's only taking it at night now..  25 min appt  FU 4 mos  Lynder Parents, MD, DFAPA .    No future appointments.  No orders of the defined types were placed in this encounter.     -------------------------------

## 2019-06-24 ENCOUNTER — Ambulatory Visit: Payer: Medicare Other | Admitting: Psychiatry

## 2019-06-24 DIAGNOSIS — I10 Essential (primary) hypertension: Secondary | ICD-10-CM | POA: Diagnosis not present

## 2019-06-24 DIAGNOSIS — E11621 Type 2 diabetes mellitus with foot ulcer: Secondary | ICD-10-CM | POA: Diagnosis not present

## 2019-06-24 DIAGNOSIS — E785 Hyperlipidemia, unspecified: Secondary | ICD-10-CM | POA: Diagnosis not present

## 2019-06-26 DIAGNOSIS — Z03818 Encounter for observation for suspected exposure to other biological agents ruled out: Secondary | ICD-10-CM | POA: Diagnosis not present

## 2019-07-01 DIAGNOSIS — G4709 Other insomnia: Secondary | ICD-10-CM | POA: Diagnosis not present

## 2019-07-01 DIAGNOSIS — K5909 Other constipation: Secondary | ICD-10-CM | POA: Diagnosis not present

## 2019-07-05 DIAGNOSIS — Z03818 Encounter for observation for suspected exposure to other biological agents ruled out: Secondary | ICD-10-CM | POA: Diagnosis not present

## 2019-07-06 DIAGNOSIS — E1169 Type 2 diabetes mellitus with other specified complication: Secondary | ICD-10-CM | POA: Diagnosis not present

## 2019-07-06 DIAGNOSIS — R2689 Other abnormalities of gait and mobility: Secondary | ICD-10-CM | POA: Diagnosis not present

## 2019-07-06 DIAGNOSIS — E785 Hyperlipidemia, unspecified: Secondary | ICD-10-CM | POA: Diagnosis not present

## 2019-07-06 DIAGNOSIS — I110 Hypertensive heart disease with heart failure: Secondary | ICD-10-CM | POA: Diagnosis not present

## 2019-07-07 DIAGNOSIS — I110 Hypertensive heart disease with heart failure: Secondary | ICD-10-CM | POA: Diagnosis not present

## 2019-07-07 DIAGNOSIS — H40013 Open angle with borderline findings, low risk, bilateral: Secondary | ICD-10-CM | POA: Diagnosis not present

## 2019-07-07 DIAGNOSIS — E119 Type 2 diabetes mellitus without complications: Secondary | ICD-10-CM | POA: Diagnosis not present

## 2019-07-07 DIAGNOSIS — E1122 Type 2 diabetes mellitus with diabetic chronic kidney disease: Secondary | ICD-10-CM | POA: Diagnosis not present

## 2019-07-07 DIAGNOSIS — H43393 Other vitreous opacities, bilateral: Secondary | ICD-10-CM | POA: Diagnosis not present

## 2019-07-07 DIAGNOSIS — E785 Hyperlipidemia, unspecified: Secondary | ICD-10-CM | POA: Diagnosis not present

## 2019-07-07 DIAGNOSIS — J189 Pneumonia, unspecified organism: Secondary | ICD-10-CM | POA: Diagnosis not present

## 2019-07-07 DIAGNOSIS — E1169 Type 2 diabetes mellitus with other specified complication: Secondary | ICD-10-CM | POA: Diagnosis not present

## 2019-07-07 DIAGNOSIS — H35033 Hypertensive retinopathy, bilateral: Secondary | ICD-10-CM | POA: Diagnosis not present

## 2019-07-07 DIAGNOSIS — R2689 Other abnormalities of gait and mobility: Secondary | ICD-10-CM | POA: Diagnosis not present

## 2019-07-08 DIAGNOSIS — E785 Hyperlipidemia, unspecified: Secondary | ICD-10-CM | POA: Diagnosis not present

## 2019-07-08 DIAGNOSIS — R2689 Other abnormalities of gait and mobility: Secondary | ICD-10-CM | POA: Diagnosis not present

## 2019-07-08 DIAGNOSIS — I110 Hypertensive heart disease with heart failure: Secondary | ICD-10-CM | POA: Diagnosis not present

## 2019-07-08 DIAGNOSIS — E1169 Type 2 diabetes mellitus with other specified complication: Secondary | ICD-10-CM | POA: Diagnosis not present

## 2019-07-09 DIAGNOSIS — E785 Hyperlipidemia, unspecified: Secondary | ICD-10-CM | POA: Diagnosis not present

## 2019-07-09 DIAGNOSIS — E1169 Type 2 diabetes mellitus with other specified complication: Secondary | ICD-10-CM | POA: Diagnosis not present

## 2019-07-09 DIAGNOSIS — I110 Hypertensive heart disease with heart failure: Secondary | ICD-10-CM | POA: Diagnosis not present

## 2019-07-09 DIAGNOSIS — R2689 Other abnormalities of gait and mobility: Secondary | ICD-10-CM | POA: Diagnosis not present

## 2019-07-12 DIAGNOSIS — E1169 Type 2 diabetes mellitus with other specified complication: Secondary | ICD-10-CM | POA: Diagnosis not present

## 2019-07-12 DIAGNOSIS — E785 Hyperlipidemia, unspecified: Secondary | ICD-10-CM | POA: Diagnosis not present

## 2019-07-12 DIAGNOSIS — R2689 Other abnormalities of gait and mobility: Secondary | ICD-10-CM | POA: Diagnosis not present

## 2019-07-12 DIAGNOSIS — I110 Hypertensive heart disease with heart failure: Secondary | ICD-10-CM | POA: Diagnosis not present

## 2019-07-13 DIAGNOSIS — E1169 Type 2 diabetes mellitus with other specified complication: Secondary | ICD-10-CM | POA: Diagnosis not present

## 2019-07-13 DIAGNOSIS — E785 Hyperlipidemia, unspecified: Secondary | ICD-10-CM | POA: Diagnosis not present

## 2019-07-13 DIAGNOSIS — R2689 Other abnormalities of gait and mobility: Secondary | ICD-10-CM | POA: Diagnosis not present

## 2019-07-13 DIAGNOSIS — I110 Hypertensive heart disease with heart failure: Secondary | ICD-10-CM | POA: Diagnosis not present

## 2019-07-14 DIAGNOSIS — R2689 Other abnormalities of gait and mobility: Secondary | ICD-10-CM | POA: Diagnosis not present

## 2019-07-14 DIAGNOSIS — E1169 Type 2 diabetes mellitus with other specified complication: Secondary | ICD-10-CM | POA: Diagnosis not present

## 2019-07-14 DIAGNOSIS — E785 Hyperlipidemia, unspecified: Secondary | ICD-10-CM | POA: Diagnosis not present

## 2019-07-14 DIAGNOSIS — I110 Hypertensive heart disease with heart failure: Secondary | ICD-10-CM | POA: Diagnosis not present

## 2019-07-15 DIAGNOSIS — I110 Hypertensive heart disease with heart failure: Secondary | ICD-10-CM | POA: Diagnosis not present

## 2019-07-15 DIAGNOSIS — E785 Hyperlipidemia, unspecified: Secondary | ICD-10-CM | POA: Diagnosis not present

## 2019-07-15 DIAGNOSIS — E1169 Type 2 diabetes mellitus with other specified complication: Secondary | ICD-10-CM | POA: Diagnosis not present

## 2019-07-15 DIAGNOSIS — R2689 Other abnormalities of gait and mobility: Secondary | ICD-10-CM | POA: Diagnosis not present

## 2019-07-18 DIAGNOSIS — R2689 Other abnormalities of gait and mobility: Secondary | ICD-10-CM | POA: Diagnosis not present

## 2019-07-18 DIAGNOSIS — I110 Hypertensive heart disease with heart failure: Secondary | ICD-10-CM | POA: Diagnosis not present

## 2019-07-18 DIAGNOSIS — E785 Hyperlipidemia, unspecified: Secondary | ICD-10-CM | POA: Diagnosis not present

## 2019-07-18 DIAGNOSIS — G4709 Other insomnia: Secondary | ICD-10-CM | POA: Diagnosis not present

## 2019-07-18 DIAGNOSIS — K219 Gastro-esophageal reflux disease without esophagitis: Secondary | ICD-10-CM | POA: Diagnosis not present

## 2019-07-18 DIAGNOSIS — E1169 Type 2 diabetes mellitus with other specified complication: Secondary | ICD-10-CM | POA: Diagnosis not present

## 2019-07-19 DIAGNOSIS — E785 Hyperlipidemia, unspecified: Secondary | ICD-10-CM | POA: Diagnosis not present

## 2019-07-19 DIAGNOSIS — R2689 Other abnormalities of gait and mobility: Secondary | ICD-10-CM | POA: Diagnosis not present

## 2019-07-19 DIAGNOSIS — I110 Hypertensive heart disease with heart failure: Secondary | ICD-10-CM | POA: Diagnosis not present

## 2019-07-19 DIAGNOSIS — E1169 Type 2 diabetes mellitus with other specified complication: Secondary | ICD-10-CM | POA: Diagnosis not present

## 2019-07-20 DIAGNOSIS — R2689 Other abnormalities of gait and mobility: Secondary | ICD-10-CM | POA: Diagnosis not present

## 2019-07-20 DIAGNOSIS — I110 Hypertensive heart disease with heart failure: Secondary | ICD-10-CM | POA: Diagnosis not present

## 2019-07-20 DIAGNOSIS — E785 Hyperlipidemia, unspecified: Secondary | ICD-10-CM | POA: Diagnosis not present

## 2019-07-20 DIAGNOSIS — E1169 Type 2 diabetes mellitus with other specified complication: Secondary | ICD-10-CM | POA: Diagnosis not present

## 2019-07-21 DIAGNOSIS — E1169 Type 2 diabetes mellitus with other specified complication: Secondary | ICD-10-CM | POA: Diagnosis not present

## 2019-07-21 DIAGNOSIS — E785 Hyperlipidemia, unspecified: Secondary | ICD-10-CM | POA: Diagnosis not present

## 2019-07-21 DIAGNOSIS — I110 Hypertensive heart disease with heart failure: Secondary | ICD-10-CM | POA: Diagnosis not present

## 2019-07-21 DIAGNOSIS — R2689 Other abnormalities of gait and mobility: Secondary | ICD-10-CM | POA: Diagnosis not present

## 2019-07-22 DIAGNOSIS — E1169 Type 2 diabetes mellitus with other specified complication: Secondary | ICD-10-CM | POA: Diagnosis not present

## 2019-07-22 DIAGNOSIS — E785 Hyperlipidemia, unspecified: Secondary | ICD-10-CM | POA: Diagnosis not present

## 2019-07-22 DIAGNOSIS — I110 Hypertensive heart disease with heart failure: Secondary | ICD-10-CM | POA: Diagnosis not present

## 2019-07-22 DIAGNOSIS — R2689 Other abnormalities of gait and mobility: Secondary | ICD-10-CM | POA: Diagnosis not present

## 2019-07-23 DIAGNOSIS — I110 Hypertensive heart disease with heart failure: Secondary | ICD-10-CM | POA: Diagnosis not present

## 2019-07-23 DIAGNOSIS — R2689 Other abnormalities of gait and mobility: Secondary | ICD-10-CM | POA: Diagnosis not present

## 2019-07-23 DIAGNOSIS — E785 Hyperlipidemia, unspecified: Secondary | ICD-10-CM | POA: Diagnosis not present

## 2019-07-23 DIAGNOSIS — E1169 Type 2 diabetes mellitus with other specified complication: Secondary | ICD-10-CM | POA: Diagnosis not present

## 2019-07-26 DIAGNOSIS — R2689 Other abnormalities of gait and mobility: Secondary | ICD-10-CM | POA: Diagnosis not present

## 2019-07-26 DIAGNOSIS — E785 Hyperlipidemia, unspecified: Secondary | ICD-10-CM | POA: Diagnosis not present

## 2019-07-26 DIAGNOSIS — I110 Hypertensive heart disease with heart failure: Secondary | ICD-10-CM | POA: Diagnosis not present

## 2019-07-26 DIAGNOSIS — E1169 Type 2 diabetes mellitus with other specified complication: Secondary | ICD-10-CM | POA: Diagnosis not present

## 2019-08-05 DIAGNOSIS — E11621 Type 2 diabetes mellitus with foot ulcer: Secondary | ICD-10-CM | POA: Diagnosis not present

## 2019-08-05 DIAGNOSIS — E785 Hyperlipidemia, unspecified: Secondary | ICD-10-CM | POA: Diagnosis not present

## 2019-08-18 DIAGNOSIS — Z03818 Encounter for observation for suspected exposure to other biological agents ruled out: Secondary | ICD-10-CM | POA: Diagnosis not present

## 2019-08-23 DIAGNOSIS — Z03818 Encounter for observation for suspected exposure to other biological agents ruled out: Secondary | ICD-10-CM | POA: Diagnosis not present

## 2019-08-26 DIAGNOSIS — Z20822 Contact with and (suspected) exposure to covid-19: Secondary | ICD-10-CM | POA: Diagnosis not present

## 2019-08-30 DIAGNOSIS — Z20822 Contact with and (suspected) exposure to covid-19: Secondary | ICD-10-CM | POA: Diagnosis not present

## 2019-09-01 DIAGNOSIS — E1122 Type 2 diabetes mellitus with diabetic chronic kidney disease: Secondary | ICD-10-CM | POA: Diagnosis not present

## 2019-09-01 DIAGNOSIS — J189 Pneumonia, unspecified organism: Secondary | ICD-10-CM | POA: Diagnosis not present

## 2019-09-02 DIAGNOSIS — Z20822 Contact with and (suspected) exposure to covid-19: Secondary | ICD-10-CM | POA: Diagnosis not present

## 2019-09-06 DIAGNOSIS — Z20822 Contact with and (suspected) exposure to covid-19: Secondary | ICD-10-CM | POA: Diagnosis not present

## 2019-09-10 ENCOUNTER — Ambulatory Visit: Payer: Medicare Other | Admitting: Podiatry

## 2019-09-13 DIAGNOSIS — Z20822 Contact with and (suspected) exposure to covid-19: Secondary | ICD-10-CM | POA: Diagnosis not present

## 2019-10-11 ENCOUNTER — Ambulatory Visit (INDEPENDENT_AMBULATORY_CARE_PROVIDER_SITE_OTHER): Payer: Medicare Other | Admitting: Psychiatry

## 2019-10-11 ENCOUNTER — Other Ambulatory Visit: Payer: Self-pay

## 2019-10-11 ENCOUNTER — Encounter: Payer: Self-pay | Admitting: Psychiatry

## 2019-10-11 DIAGNOSIS — F411 Generalized anxiety disorder: Secondary | ICD-10-CM | POA: Diagnosis not present

## 2019-10-11 DIAGNOSIS — F422 Mixed obsessional thoughts and acts: Secondary | ICD-10-CM | POA: Diagnosis not present

## 2019-10-11 DIAGNOSIS — G301 Alzheimer's disease with late onset: Secondary | ICD-10-CM | POA: Diagnosis not present

## 2019-10-11 DIAGNOSIS — F200 Paranoid schizophrenia: Secondary | ICD-10-CM

## 2019-10-11 DIAGNOSIS — F5105 Insomnia due to other mental disorder: Secondary | ICD-10-CM

## 2019-10-11 DIAGNOSIS — F0281 Dementia in other diseases classified elsewhere with behavioral disturbance: Secondary | ICD-10-CM

## 2019-10-11 NOTE — Progress Notes (Signed)
Ricky [name] [id number] 05-19-1940 79 y.o.     Subjective:   Patient ID:  Ricky [name]. is a 79 y.o. (DOB [birth year]) male.  Chief Complaint:  Chief Complaint  Patient presents with  . Follow-up    Medication Management  . Schizophrenia    Medication Management  . Anxiety    Depression        Associated symptoms include decreased concentration and fatigue.  Associated symptoms include no headaches and no suicidal ideas.  Ricky [name]. presents to the office today for follow-up of schizophrenia and dementia.  Resident of [facility].  seen April 15, 2019.  No meds were changed.  As of 06/03/2019 the following is noted: Staff reports patient calls very frequently and has for an extended period of time.  He is chronically somewhat needy and lonely as well as has a tendency for reassurance checking. Still bothered by religious thoughts and can't tell if it's from God than or Satan.  Supernatural things get in his mind.  Also thinks that people there don't like him at [facility].  Also upset at Biden's spending excess.  Wants to vote for [name].  Also bothered he doesn't get to do much.    Had thoughts he needed to go to the hospital one night for [place] reasons but staff encouraged him to wait it out and he felt better the next day. Poor STM. No med changes  9/20/21appt with the following noted: Worries over losing trusted banker.  Worries money is running out.  Worries about getting Covid.  Memory is worse.  Gets obsessive fears about his salvation especially  Listening to [place] radio at times but has chronically worried over it.   Hears voices talking about his mental health and to listen to radio.   Don't have a whole lot of them but enough to get him upset from time to time.. Not markedly depressed.  Past Psychiatric Medication Trials:  All of them are unknown,  haloperidol alone, serentil, Stelazine, Moban, perphenazine, risperidone, Thorazine,  Seroquel 800 mg, loxapine, been on Zyprexa since September 2006 and that has produced the best control of his paranoia at 25 mg daily. benztropine,    paroxetine, sertraline side effects,   Hydroxyzine, trazodone no response,  Xanax,  Review of Systems:  Review of Systems  Constitutional: Positive for fatigue.  HENT: Positive for tinnitus.   Cardiovascular: Negative for chest pain.  Genitourinary: Positive for enuresis.  Musculoskeletal: Positive for arthralgias, back pain and gait problem.  Neurological: Positive for dizziness, tremors and weakness. Negative for headaches.       WC bound  Psychiatric/Behavioral: Positive for confusion, decreased concentration, depression, hallucinations and sleep disturbance. Negative for agitation, self-injury and suicidal ideas. The patient is nervous/anxious. The patient is not hyperactive.     Medications: I have reviewed the patient's current medications.  Current Outpatient Medications  Medication Sig Dispense Refill  . ALPRAZolam (XANAX) 0.5 MG tablet Take 1 tablet (0.5 mg total) by mouth at bedtime. (Patient taking differently: Take 0.25 mg by mouth at bedtime. ) 2 tablet 0  . amLODipine (NORVASC) 5 MG tablet Take 5 mg by mouth daily.    . Calcium Citrate-Vitamin D (CITRACAL + D PO) Take 1 tablet by mouth daily.    . Cholecalciferol (VITAMIN D-3) 1000 units CAPS Take 1 capsule by mouth daily.    Marland Kitchen docusate sodium (COLACE) 100 MG capsule Take 100 mg by mouth 2 (two) times daily.    Marland Kitchen  empagliflozin (JARDIANCE) 10 MG TABS tablet Take 10 mg by mouth daily. 2 tablet 0  . fluvoxaMINE (LUVOX) 100 MG tablet Take 1 tablet (100 mg total) by mouth at bedtime. 2 tablet 0  . furosemide (LASIX) 20 MG tablet Take 20 mg by mouth daily before breakfast.     . HUMALOG KWIKPEN 100 UNIT/ML KiwkPen Inject 2-11 Units into the skin See admin instructions. Times: 0630 1130 1630  Scale: 101-150: 2 units 151-200: 3 units 201-250: 5 units 251-300: 7  units 301-350: 9 units >350: 11 units    . insulin detemir (LEVEMIR) 100 UNIT/ML injection Inject 0.3 mLs (30 Units total) into the skin at bedtime. (Patient taking differently: Inject 24 Units into the skin at bedtime. ) 10 mL 11  . Menthol, Topical Analgesic, (BIOFREEZE) 4 % GEL Apply topically.    . Multiple Vitamins-Minerals (THERAGRAN-M PREMIER 50 PLUS PO) Take 1 tablet by mouth daily.    Marland Kitchen MYRBETRIQ 25 MG TB24 tablet Take 25 mg by mouth daily.     Marland Kitchen OLANZapine (ZYPREXA) 5 MG tablet Take 5 tablets (25 mg total) by mouth daily. 2 tablet 0  . omeprazole (PRILOSEC) 40 MG capsule Take 40 mg by mouth daily.     . polyethylene glycol powder (GAVILAX) 17 GM/SCOOP powder Take 1 Container by mouth once.    . pravastatin (PRAVACHOL) 40 MG tablet Take 40 mg by mouth daily after lunch.     . tamsulosin (FLOMAX) 0.4 MG CAPS capsule Take 0.4 mg by mouth daily.     Marland Kitchen zolpidem (AMBIEN) 5 MG tablet Take 1 tablet (5 mg total) by mouth at bedtime. 2 tablet 0   No current facility-administered medications for this visit.    Medication Side Effects: Other: dry mouth  Allergies:  Allergies  Allergen Reactions  . Asa [Aspirin]     "Dr told me not to take it"  . Codeine Other (See Comments)    DIZZINESS with Tylenol 3  . Tylenol [Acetaminophen] Other (See Comments)    Dizziness with tylenol 3    Past Medical History:  Diagnosis Date  . Abnormality of gait   . Adenomatous colon polyp   . Arthritis   . Cataract   . Dementia ([place])   . Depression   . Diabetes mellitus without complication ([place])   . Fatty liver   . Hyperlipidemia   . Hypertension   . Internal hemorrhoids   . Other malaise and fatigue   . Peripheral edema   . Schizophrenia ([place])   . Type II or unspecified type diabetes mellitus without mention of complication, not stated as uncontrolled   . Urinary frequency   . Urinary retention     Family History  Problem Relation Age of Onset  . Brain cancer Father     Social  History   Socioeconomic History  . Marital status: Single    Spouse name: Not on file  . Number of children: Not on file  . Years of education: college  . Highest education level: Not on file  Occupational History    Employer: RETIRED    Comment: Disabled  Tobacco Use  . Smoking status: Never Smoker  . Smokeless tobacco: Never Used  Substance and Sexual Activity  . Alcohol use: No  . Drug use: No  . Sexual activity: Not on file  Other Topics Concern  . Not on file  Social History Narrative   Patient is disabled and he has not worked since 1962. Patient has some college  education.Patient drinks 2 cups of coffee daily.   Right handed.   Social Determinants of Health   Financial Resource Strain:   . Difficulty of Paying Living Expenses: Not on file  Food Insecurity:   . Worried About Charity fundraiser in the Last Year: Not on file  . Ran Out of Food in the Last Year: Not on file  Transportation Needs:   . Lack of Transportation (Medical): Not on file  . Lack of Transportation (Non-Medical): Not on file  Physical Activity:   . Days of Exercise per Week: Not on file  . Minutes of Exercise per Session: Not on file  Stress:   . Feeling of Stress : Not on file  Social Connections:   . Frequency of Communication with Friends and Family: Not on file  . Frequency of Social Gatherings with Friends and Family: Not on file  . Attends Religious Services: Not on file  . Active Member of Clubs or Organizations: Not on file  . Attends Archivist Meetings: Not on file  . Marital Status: Not on file  Intimate Partner Violence:   . Fear of Current or Ex-Partner: Not on file  . Emotionally Abused: Not on file  . Physically Abused: Not on file  . Sexually Abused: Not on file    Past Medical History, Surgical history, Social history, and Family history were reviewed and updated as appropriate.   Please see review of systems for further details on the patient's review from  today.   Objective:   Physical Exam:  There were no vitals taken for this visit.  Physical Exam Constitutional:      Appearance: He is obese.  Neurological:     Mental Status: He is alert and oriented to person, place, and time.     Cranial Nerves: Dysarthria present.     Motor: Weakness present.     Gait: Gait abnormal.     Comments: Mild dysarthria chronic   Psychiatric:        Attention and Perception: Attention normal. He is attentive. He perceives auditory hallucinations. He does not perceive visual hallucinations.        Mood and Affect: Mood is anxious. Mood is not depressed. Affect is not angry or tearful.        Speech: Speech normal. Speech is not rapid and pressured or slurred.        Behavior: Behavior is slowed. Behavior is not agitated or aggressive. Behavior is cooperative.        Thought Content: Thought content is paranoid and delusional. Thought content does not include homicidal or suicidal ideation. Thought content does not include homicidal or suicidal plan.        Cognition and Memory: Cognition is impaired. Memory is impaired. He does not exhibit impaired recent memory.     Comments: Chronic voices. Fair insight and judgment. Talkative and repeats himself some. Worsening memory. Loses words at times Needy for reassurance as usual but has trouble articulating what he needs exactly. Some IOR re commericials on TV. Obsessive spiritual thought and compulsive behaviors are prominent but manageable.     Lab Review:     Component Value Date/Time   NA 139 01/22/2018 0339   K 3.6 01/22/2018 0339   CL 100 01/22/2018 0339   CO2 28 01/22/2018 0339   GLUCOSE 231 (H) 01/22/2018 0339   BUN 15 01/22/2018 0339   CREATININE 0.96 01/22/2018 0339   CALCIUM 9.2 01/22/2018 0339   PROT 7.0 01/22/2018  2409   ALBUMIN 3.3 (L) 01/22/2018 0339   AST 35 01/22/2018 0339   ALT 38 01/22/2018 0339   ALKPHOS 71 01/22/2018 0339   BILITOT 0.6 01/22/2018 0339   GFRNONAA >60  01/22/2018 0339   GFRAA >60 01/22/2018 0339       Component Value Date/Time   WBC 6.8 01/22/2018 0339   RBC 4.48 01/22/2018 0339   HGB 12.9 (L) 01/22/2018 0339   HCT 41.7 01/22/2018 0339   PLT 211 01/22/2018 0339   MCV 93.1 01/22/2018 0339   MCH 28.8 01/22/2018 0339   MCHC 30.9 01/22/2018 0339   RDW 14.0 01/22/2018 0339   LYMPHSABS 1.8 01/22/2018 0339   MONOABS 0.6 01/22/2018 0339   EOSABS 0.3 01/22/2018 0339   BASOSABS 0.1 01/22/2018 0339    No results found for: POCLITH, LITHIUM   No results found for: PHENYTOIN, PHENOBARB, VALPROATE, CBMZ   .res Assessment: Plan:    Drayden was seen today for follow-up, schizophrenia and anxiety.  Diagnoses and all orders for this visit:  Schizophrenia, paranoid ([place])  Mixed obsessional thoughts and acts  Generalized anxiety disorder  Late onset Alzheimer's disease with behavioral disturbance ([place])  Insomnia due to mental condition   Reviewed documents today from [facility] including med list. Patient has been under my psychiatric years since 1998 .unlikely that med change will reduce the paranoia and auditory hallucinations which is chronic.  It is no worse than last visit but is chronic invlolving God and staff.  Reports still cooperative with staff.  He has been on multiple psychiatric medications and has done best on this combination of Zyprexa which was increased to 25 mg April 09, 2018, a minimal dose of alprazolam 0.25 mg HS, and fluvoxamine 100 mg daily.  Chronic paranoia without change.  Is cooperative genereally.   Consider stopping alprazolam HS as long as he's sleeping well.  He also still has obsessive and intrusive fears about losing his salvation but they are chronic and have not been easily managed.  Given his age and the chronicity of the symptoms med changes are unlikely to help.    Weaker and needing more physical assistance.    Supportive therapy dealing with chronic paranoia and isolation from Covid.   Encourage his spirituality as a coping mechanism.  Needs a lot of encouragement.  Repetitively asks for reassurance. Needs reassurance he does not need to go to the hospital.  Discussed potential metabolic side effects associated with atypical antipsychotics, as well as potential risk for movement side effects. Advised pt to contact office if movement side effects occur.   Disc he's taking more than the usuall highest dose of olanzapine but is medically necessary for paranoia.  No med changes today. Successfully reduced alprazolam to 0.25 mg HS and stopped hydroxyzine...  25 min appt  FU 4-6 mos  Lynder Parents, MD, DFAPA .    Future Appointments  Date Time Provider [facility]  11/30/2019  1:15 PM Gardiner Barefoot, DPM [facility]    No orders of the defined types were placed in this encounter.     -------------------------------

## 2019-10-15 DIAGNOSIS — E119 Type 2 diabetes mellitus without complications: Secondary | ICD-10-CM | POA: Diagnosis not present

## 2019-10-27 DIAGNOSIS — E1122 Type 2 diabetes mellitus with diabetic chronic kidney disease: Secondary | ICD-10-CM | POA: Diagnosis not present

## 2019-10-29 DIAGNOSIS — E11621 Type 2 diabetes mellitus with foot ulcer: Secondary | ICD-10-CM | POA: Diagnosis not present

## 2019-10-29 DIAGNOSIS — E785 Hyperlipidemia, unspecified: Secondary | ICD-10-CM | POA: Diagnosis not present

## 2019-11-04 DIAGNOSIS — E13621 Other specified diabetes mellitus with foot ulcer: Secondary | ICD-10-CM | POA: Diagnosis not present

## 2019-11-04 DIAGNOSIS — E785 Hyperlipidemia, unspecified: Secondary | ICD-10-CM | POA: Diagnosis not present

## 2019-11-04 DIAGNOSIS — E11621 Type 2 diabetes mellitus with foot ulcer: Secondary | ICD-10-CM | POA: Diagnosis not present

## 2019-11-11 DIAGNOSIS — E030 Congenital hypothyroidism with diffuse goiter: Secondary | ICD-10-CM | POA: Diagnosis not present

## 2019-11-11 DIAGNOSIS — E559 Vitamin D deficiency, unspecified: Secondary | ICD-10-CM | POA: Diagnosis not present

## 2019-11-11 DIAGNOSIS — E119 Type 2 diabetes mellitus without complications: Secondary | ICD-10-CM | POA: Diagnosis not present

## 2019-11-11 DIAGNOSIS — D649 Anemia, unspecified: Secondary | ICD-10-CM | POA: Diagnosis not present

## 2019-11-11 DIAGNOSIS — E785 Hyperlipidemia, unspecified: Secondary | ICD-10-CM | POA: Diagnosis not present

## 2019-11-28 DIAGNOSIS — K219 Gastro-esophageal reflux disease without esophagitis: Secondary | ICD-10-CM | POA: Diagnosis not present

## 2019-11-28 DIAGNOSIS — G4709 Other insomnia: Secondary | ICD-10-CM | POA: Diagnosis not present

## 2019-11-30 ENCOUNTER — Ambulatory Visit: Payer: Medicare Other | Admitting: Podiatry

## 2019-12-15 ENCOUNTER — Ambulatory Visit: Payer: Medicare Other | Admitting: Psychiatry

## 2019-12-22 DIAGNOSIS — E1122 Type 2 diabetes mellitus with diabetic chronic kidney disease: Secondary | ICD-10-CM | POA: Diagnosis not present

## 2019-12-28 DIAGNOSIS — Z20822 Contact with and (suspected) exposure to covid-19: Secondary | ICD-10-CM | POA: Diagnosis not present

## 2019-12-30 DIAGNOSIS — Z20822 Contact with and (suspected) exposure to covid-19: Secondary | ICD-10-CM | POA: Diagnosis not present

## 2020-01-21 DIAGNOSIS — K5909 Other constipation: Secondary | ICD-10-CM | POA: Diagnosis not present

## 2020-01-21 DIAGNOSIS — K219 Gastro-esophageal reflux disease without esophagitis: Secondary | ICD-10-CM | POA: Diagnosis not present

## 2020-02-16 DIAGNOSIS — I10 Essential (primary) hypertension: Secondary | ICD-10-CM | POA: Diagnosis not present

## 2020-02-16 DIAGNOSIS — E559 Vitamin D deficiency, unspecified: Secondary | ICD-10-CM | POA: Diagnosis not present

## 2020-02-16 DIAGNOSIS — E785 Hyperlipidemia, unspecified: Secondary | ICD-10-CM | POA: Diagnosis not present

## 2020-02-16 DIAGNOSIS — E119 Type 2 diabetes mellitus without complications: Secondary | ICD-10-CM | POA: Diagnosis not present

## 2020-02-21 ENCOUNTER — Ambulatory Visit (INDEPENDENT_AMBULATORY_CARE_PROVIDER_SITE_OTHER): Payer: Medicare Other | Admitting: Psychiatry

## 2020-02-21 ENCOUNTER — Other Ambulatory Visit: Payer: Self-pay

## 2020-02-21 ENCOUNTER — Encounter: Payer: Self-pay | Admitting: Psychiatry

## 2020-02-21 DIAGNOSIS — F200 Paranoid schizophrenia: Secondary | ICD-10-CM | POA: Diagnosis not present

## 2020-02-21 DIAGNOSIS — F422 Mixed obsessional thoughts and acts: Secondary | ICD-10-CM

## 2020-02-21 DIAGNOSIS — F5105 Insomnia due to other mental disorder: Secondary | ICD-10-CM

## 2020-02-21 DIAGNOSIS — F411 Generalized anxiety disorder: Secondary | ICD-10-CM | POA: Diagnosis not present

## 2020-02-21 DIAGNOSIS — F0281 Dementia in other diseases classified elsewhere with behavioral disturbance: Secondary | ICD-10-CM

## 2020-02-21 DIAGNOSIS — G301 Alzheimer's disease with late onset: Secondary | ICD-10-CM

## 2020-02-21 NOTE — Progress Notes (Signed)
Ricky [name] [id number] 05/19/1940 80 y.o.     Subjective:   Patient ID:  Ricky [name]. is a 80 y.o. (DOB Aug 12, 1940) male.  Chief Complaint:  Chief Complaint  Patient presents with  . Follow-up  . Schizophrenia  . Anxiety  . Stress    Health problems  . Sleeping Problem    Depression        Associated symptoms include decreased concentration and fatigue.  Associated symptoms include no headaches and no suicidal ideas.  Ricky [name]. presents to the office today for follow-up of schizophrenia and dementia.  Resident of [facility].  seen April 15, 2019.  No meds were changed.  As of 06/03/2019 the following is noted: Staff reports patient calls very frequently and has for an extended period of time.  He is chronically somewhat needy and lonely as well as has a tendency for reassurance checking. Still bothered by religious thoughts and can't tell if it's from God than or Satan.  Supernatural things get in his mind.  Also thinks that people there don't like him at [facility].  Also upset at Biden's spending excess.  Wants to vote for [name].  Also bothered he doesn't get to do much.    Had thoughts he needed to go to the hospital one night for [place] reasons but staff encouraged him to wait it out and he felt better the next day. Poor STM. No med changes  9/20/21appt with the following noted: Worries over losing trusted banker.  Worries money is running out.  Worries about getting Covid.  Memory is worse.  Gets obsessive fears about his salvation especially  Listening to [place] radio at times but has chronically worried over it.   Hears voices talking about his mental health and to listen to radio.   Don't have a whole lot of them but enough to get him upset from time to time.. Not markedly depressed. Plan no med changes  02/21/2020 appointment with the following noted: [place] some noise, ring in his head but not voices. Worries about it. They take  good care of me over there.  Listens to radio a lot at night. And doesn't get a lot of sleep at night but will nap daytime. Reads bible but often doesn't understand or remember it. No SE. Calls on [facility] to help him emotionally and spiritually.   LTM good.  More poor STM.    Wonders if it's possible that he'll ever get well.    Watched NFL football yesterday and enjoyed it.  Past Psychiatric Medication Trials:  All of them are unknown,  haloperidol alone, serentil, Stelazine, Moban, perphenazine, risperidone, Thorazine, Seroquel 800 mg, loxapine, been on Zyprexa since September 2006 and that has produced the best control of his paranoia at 25 mg daily. benztropine,    paroxetine, sertraline side effects,   Hydroxyzine, trazodone no response,  Xanax,  Review of Systems:  Review of Systems  Constitutional: Positive for fatigue.  HENT: Positive for tinnitus.   Cardiovascular: Negative for chest pain and palpitations.  Genitourinary: Positive for enuresis.  Musculoskeletal: Positive for arthralgias, back pain and gait problem.  Neurological: Positive for dizziness, tremors and weakness. Negative for headaches.       WC bound  Psychiatric/Behavioral: Positive for confusion, decreased concentration, depression, hallucinations and sleep disturbance. Negative for agitation, self-injury and suicidal ideas. The patient is nervous/anxious. The patient is not hyperactive.     Medications: I have reviewed the patient's current medications.  Current Outpatient Medications  Medication Sig Dispense Refill  . ALPRAZolam (XANAX) 0.5 MG tablet Take 1 tablet (0.5 mg total) by mouth at bedtime. (Patient taking differently: Take 0.25 mg by mouth at bedtime.) 2 tablet 0  . amLODipine (NORVASC) 5 MG tablet Take 5 mg by mouth daily.    . Calcium Citrate-Vitamin D (CITRACAL + D PO) Take 1 tablet by mouth daily.    . Cholecalciferol (VITAMIN D-3) 1000 units CAPS Take 1 capsule by mouth daily.    Marland Kitchen docusate  sodium (COLACE) 100 MG capsule Take 100 mg by mouth 2 (two) times daily.    . empagliflozin (JARDIANCE) 10 MG TABS tablet Take 10 mg by mouth daily. 2 tablet 0  . fluvoxaMINE (LUVOX) 100 MG tablet Take 1 tablet (100 mg total) by mouth at bedtime. 2 tablet 0  . furosemide (LASIX) 20 MG tablet Take 20 mg by mouth daily before breakfast.     . HUMALOG KWIKPEN 100 UNIT/ML KiwkPen Inject 2-11 Units into the skin See admin instructions. Times: 0630 1130 1630  Scale: 101-150: 2 units 151-200: 3 units 201-250: 5 units 251-300: 7 units 301-350: 9 units >350: 11 units    . insulin detemir (LEVEMIR) 100 UNIT/ML injection Inject 0.3 mLs (30 Units total) into the skin at bedtime. (Patient taking differently: Inject 24 Units into the skin at bedtime.) 10 mL 11  . Menthol, Topical Analgesic, (BIOFREEZE) 4 % GEL Apply topically.    . Multiple Vitamins-Minerals (THERAGRAN-M PREMIER 50 PLUS PO) Take 1 tablet by mouth daily.    Marland Kitchen MYRBETRIQ 25 MG TB24 tablet Take 25 mg by mouth daily.     Marland Kitchen OLANZapine (ZYPREXA) 5 MG tablet Take 5 tablets (25 mg total) by mouth daily. 2 tablet 0  . omeprazole (PRILOSEC) 40 MG capsule Take 40 mg by mouth daily.     . polyethylene glycol powder (GLYCOLAX/MIRALAX) 17 GM/SCOOP powder Take 1 Container by mouth once.    . pravastatin (PRAVACHOL) 40 MG tablet Take 40 mg by mouth daily after lunch.     . tamsulosin (FLOMAX) 0.4 MG CAPS capsule Take 0.4 mg by mouth daily.     Marland Kitchen zolpidem (AMBIEN) 5 MG tablet Take 1 tablet (5 mg total) by mouth at bedtime. 2 tablet 0   No current facility-administered medications for this visit.    Medication Side Effects: Other: dry mouth  Allergies:  Allergies  Allergen Reactions  . Asa [Aspirin]     "Dr told me not to take it"  . Codeine Other (See Comments)    DIZZINESS with Tylenol 3  . Tylenol [Acetaminophen] Other (See Comments)    Dizziness with tylenol 3    Past Medical History:  Diagnosis Date  . Abnormality of gait   .  Adenomatous colon polyp   . Arthritis   . Cataract   . Dementia ([place])   . Depression   . Diabetes mellitus without complication ([place])   . Fatty liver   . Hyperlipidemia   . Hypertension   . Internal hemorrhoids   . Other malaise and fatigue   . Peripheral edema   . Schizophrenia ([place])   . Type II or unspecified type diabetes mellitus without mention of complication, not stated as uncontrolled   . Urinary frequency   . Urinary retention     Family History  Problem Relation Age of Onset  . Brain cancer Father     Social History   Socioeconomic History  . Marital status: Single  Spouse name: Not on file  . Number of children: Not on file  . Years of education: college  . Highest education level: Not on file  Occupational History    Employer: RETIRED    Comment: Disabled  Tobacco Use  . Smoking status: Never Smoker  . Smokeless tobacco: Never Used  Substance and Sexual Activity  . Alcohol use: No  . Drug use: No  . Sexual activity: Not on file  Other Topics Concern  . Not on file  Social History Narrative   Patient is disabled and he has not worked since 1962. Patient has some college education.Patient drinks 2 cups of coffee daily.   Right handed.   Social Determinants of Health   Financial Resource Strain: Not on file  Food Insecurity: Not on file  Transportation Needs: Not on file  Physical Activity: Not on file  Stress: Not on file  Social Connections: Not on file  Intimate Partner Violence: Not on file    Past Medical History, Surgical history, Social history, and Family history were reviewed and updated as appropriate.   Please see review of systems for further details on the patient's review from today.   Objective:   Physical Exam:  There were no vitals taken for this visit.  Physical Exam Constitutional:      Appearance: He is obese.  Neurological:     Mental Status: He is alert and oriented to person, place, and time.     Cranial Nerves:  Dysarthria present.     Motor: Weakness present.     Gait: Gait abnormal.     Comments: Mild dysarthria chronic   Psychiatric:        Attention and Perception: Attention normal. He is attentive. He perceives auditory hallucinations. He does not perceive visual hallucinations.        Mood and Affect: Mood is anxious. Mood is not depressed. Affect is not angry or tearful.        Speech: Speech normal. Speech is not rapid and pressured or slurred.        Behavior: Behavior is slowed. Behavior is not agitated or aggressive. Behavior is cooperative.        Thought Content: Thought content is paranoid and delusional. Thought content does not include homicidal or suicidal ideation. Thought content does not include homicidal or suicidal plan.        Cognition and Memory: Cognition is impaired. Memory is impaired. He does not exhibit impaired recent memory.     Comments: Chronic voices some better. Fair insight and judgment. Talkative and repeats himself some. Worsening memory. Loses words at times Needy for reassurance as usual but has trouble articulating what he needs exactly. Some IOR re commericials on TV. Obsessive spiritual thought and compulsive behaviors are prominent but manageable.  Less paranoid than usual. Affect pretty calm.  He agrees     Lab Review:     Component Value Date/Time   NA 139 01/22/2018 0339   K 3.6 01/22/2018 0339   CL 100 01/22/2018 0339   CO2 28 01/22/2018 0339   GLUCOSE 231 (H) 01/22/2018 0339   BUN 15 01/22/2018 0339   CREATININE 0.96 01/22/2018 0339   CALCIUM 9.2 01/22/2018 0339   PROT 7.0 01/22/2018 0339   ALBUMIN 3.3 (L) 01/22/2018 0339   AST 35 01/22/2018 0339   ALT 38 01/22/2018 0339   ALKPHOS 71 01/22/2018 0339   BILITOT 0.6 01/22/2018 0339   GFRNONAA >60 01/22/2018 0339   GFRAA >60 01/22/2018 [id number]  Component Value Date/Time   WBC 6.8 01/22/2018 0339   RBC 4.48 01/22/2018 0339   HGB 12.9 (L) 01/22/2018 0339   HCT 41.7 01/22/2018  0339   PLT 211 01/22/2018 0339   MCV 93.1 01/22/2018 0339   MCH 28.8 01/22/2018 0339   MCHC 30.9 01/22/2018 0339   RDW 14.0 01/22/2018 0339   LYMPHSABS 1.8 01/22/2018 0339   MONOABS 0.6 01/22/2018 0339   EOSABS 0.3 01/22/2018 0339   BASOSABS 0.1 01/22/2018 0339    No results found for: POCLITH, LITHIUM   No results found for: PHENYTOIN, PHENOBARB, VALPROATE, CBMZ   .res Assessment: Plan:    Ricky [name] was seen today for follow-up, schizophrenia, anxiety, stress and sleeping problem.  Diagnoses and all orders for this visit:  Schizophrenia, paranoid ([place])  Mixed obsessional thoughts and acts  Generalized anxiety disorder  Late onset Alzheimer's disease with behavioral disturbance ([place])  Insomnia due to mental condition   Reviewed documents today from [facility] including med list.   Patient has been under my psychiatric years since 1998 .unlikely that med change will reduce the paranoia and auditory hallucinations which are chronic. Voices are better and paranoia appears a little better but varies.  Reports still cooperative with staff.  He has been on multiple psychiatric medications and has done best on this combination of Zyprexa which was increased to 25 mg April 09, 2018, a minimal dose of alprazolam 0.25 mg HS, and fluvoxamine 100 mg daily. Is cooperative genereally.   Consider stopping alprazolam 0.25 mg HS as long as he's sleeping well.  He also still has obsessive and intrusive fears about losing his salvation but they are chronic and have not been easily managed.  Given his age and the chronicity of the symptoms med changes are unlikely to help.  Overall it appears a little better at present.  Weaker and needing more physical assistance.    Supportive therapy dealing with chronic paranoia and isolation from Covid.  Encourage his spirituality as a coping mechanism.  Needs a lot of encouragement.  Repetitively asks for reassurance. Needs reassurance he does not  need to go to the hospital.  Discussed potential metabolic side effects associated with atypical antipsychotics, as well as potential risk for movement side effects. Advised pt to contact office if movement side effects occur.   Disc he's taking more than the usuall highest dose of olanzapine but is medically necessary for paranoia.  No med changes today. Successfully reduced alprazolam to 0.25 mg HS  25 min appt  FU 4-6 mos  Lynder Parents, MD, DFAPA .    No future appointments.  No orders of the defined types were placed in this encounter.     -------------------------------

## 2020-02-24 DIAGNOSIS — K219 Gastro-esophageal reflux disease without esophagitis: Secondary | ICD-10-CM | POA: Diagnosis not present

## 2020-02-24 DIAGNOSIS — E1169 Type 2 diabetes mellitus with other specified complication: Secondary | ICD-10-CM | POA: Diagnosis not present

## 2020-03-08 DIAGNOSIS — E1122 Type 2 diabetes mellitus with diabetic chronic kidney disease: Secondary | ICD-10-CM | POA: Diagnosis not present

## 2020-03-15 DIAGNOSIS — E119 Type 2 diabetes mellitus without complications: Secondary | ICD-10-CM | POA: Diagnosis not present

## 2020-03-20 IMAGING — DX DG CHEST 1V PORT
1 series · 1 of 1 positions shown · non-contrast
Comparison: 09/17/2016 chest x-ray.

CLINICAL DATA: 76-year-old male post seizure.  Initial encounter.

EXAM:
PORTABLE CHEST 1 VIEW

[chest ap]
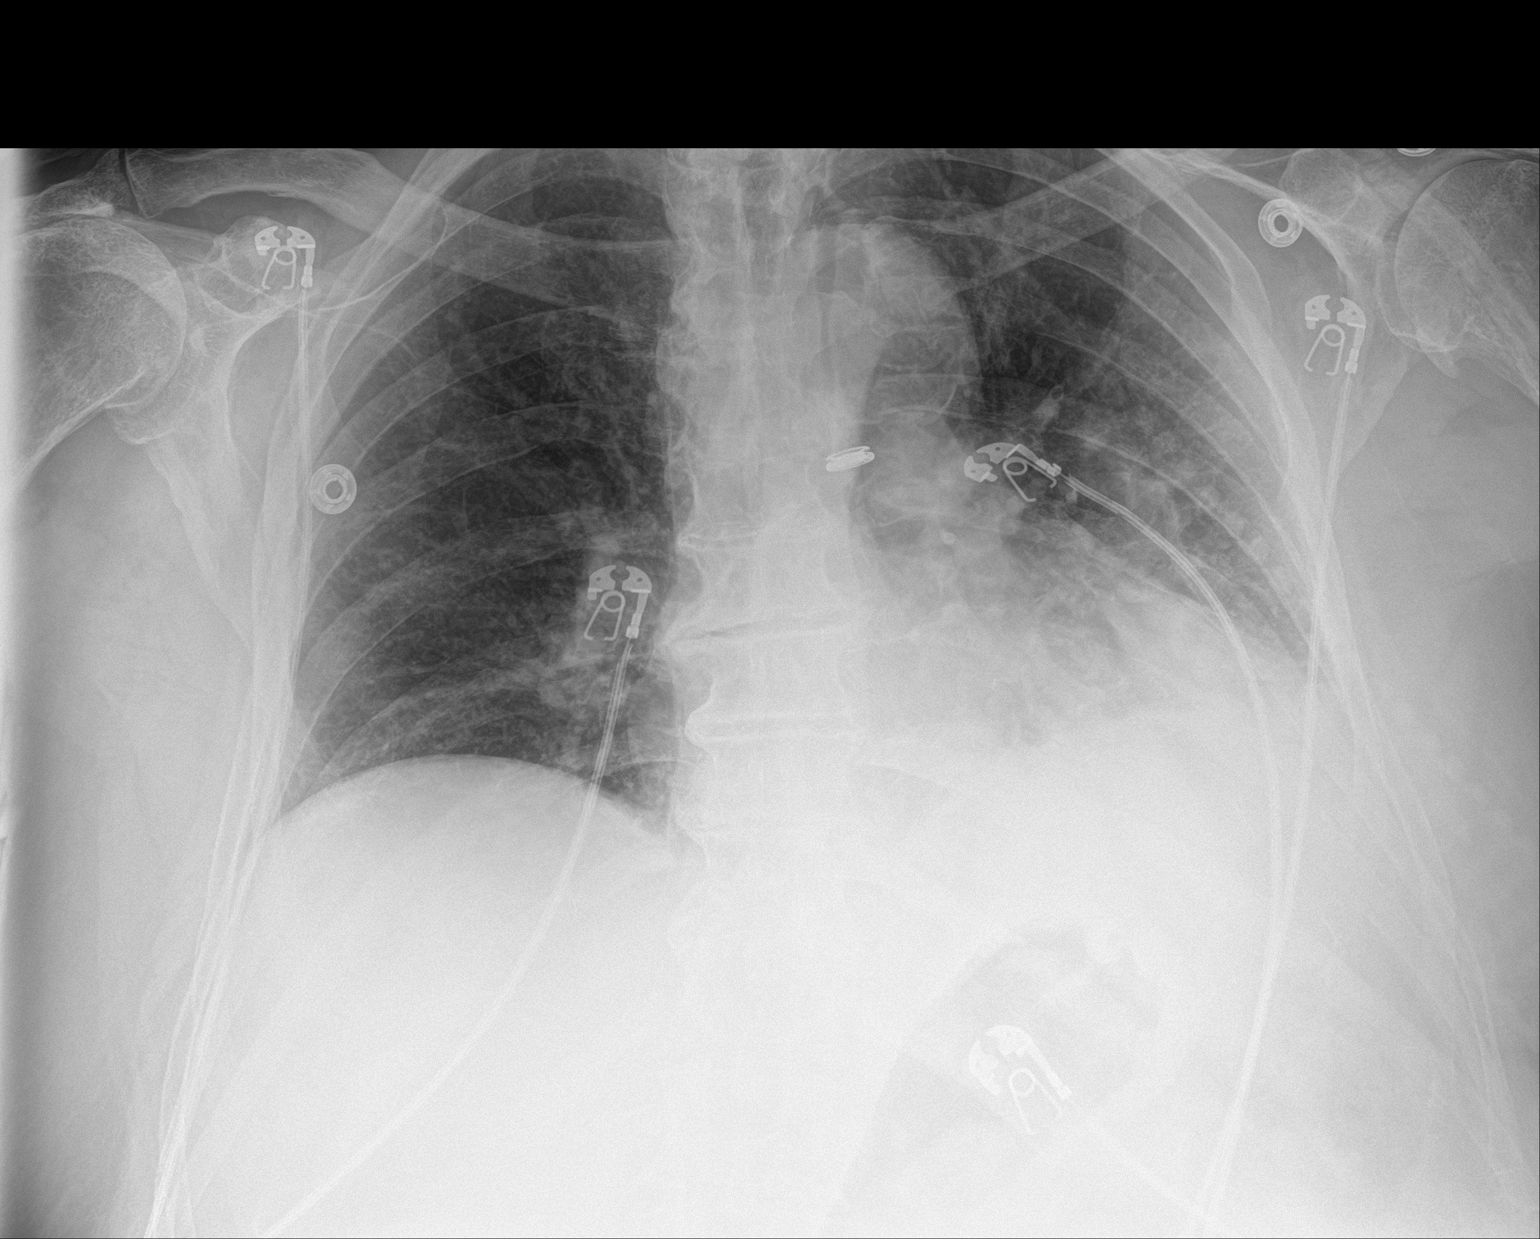

[1 of 1 positions shown; findings below may reference images not displayed]

FINDINGS: Poor inspiration. Crowding lung markings left lung base may
represent atelectasis. Cannot exclude left lower lobe infiltrate.

Heart size top-normal.

Calcified mildly tortuous aorta.

No pneumothorax.

Bilateral shoulder joint degenerative changes. No acute fracture
noted.
IMPRESSION: Crowding of lung markings left lung base may represent atelectasis.
Cannot exclude left lower lobe infiltrate.

Aortic Atherosclerosis (IBTAE-NIZ.Z).

## 2020-03-22 IMAGING — DX DG CHEST 2V
2 series · 2 of 2 positions shown · non-contrast
Comparison: 05/06/2017.  09/15/2016.

CLINICAL DATA: Shortness of breath.  Pneumonia.

EXAM:
CHEST - 2 VIEW

[x chest ap]
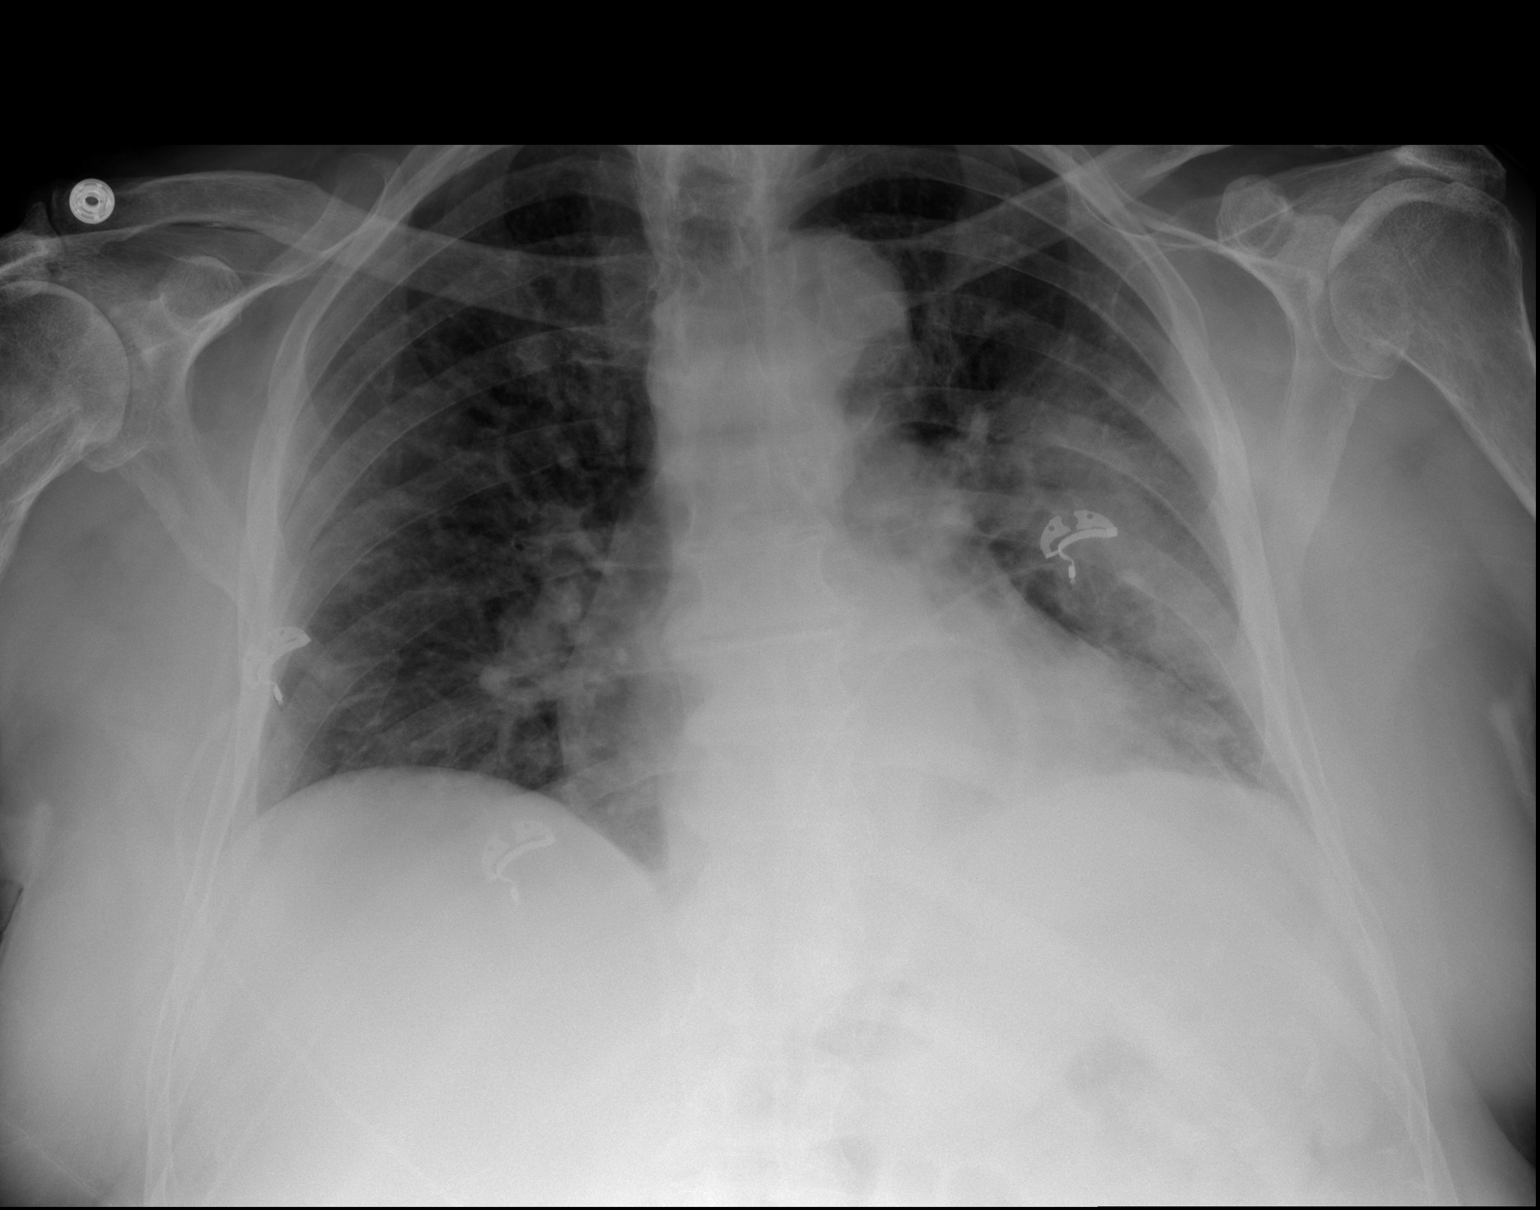

[w chest lat]
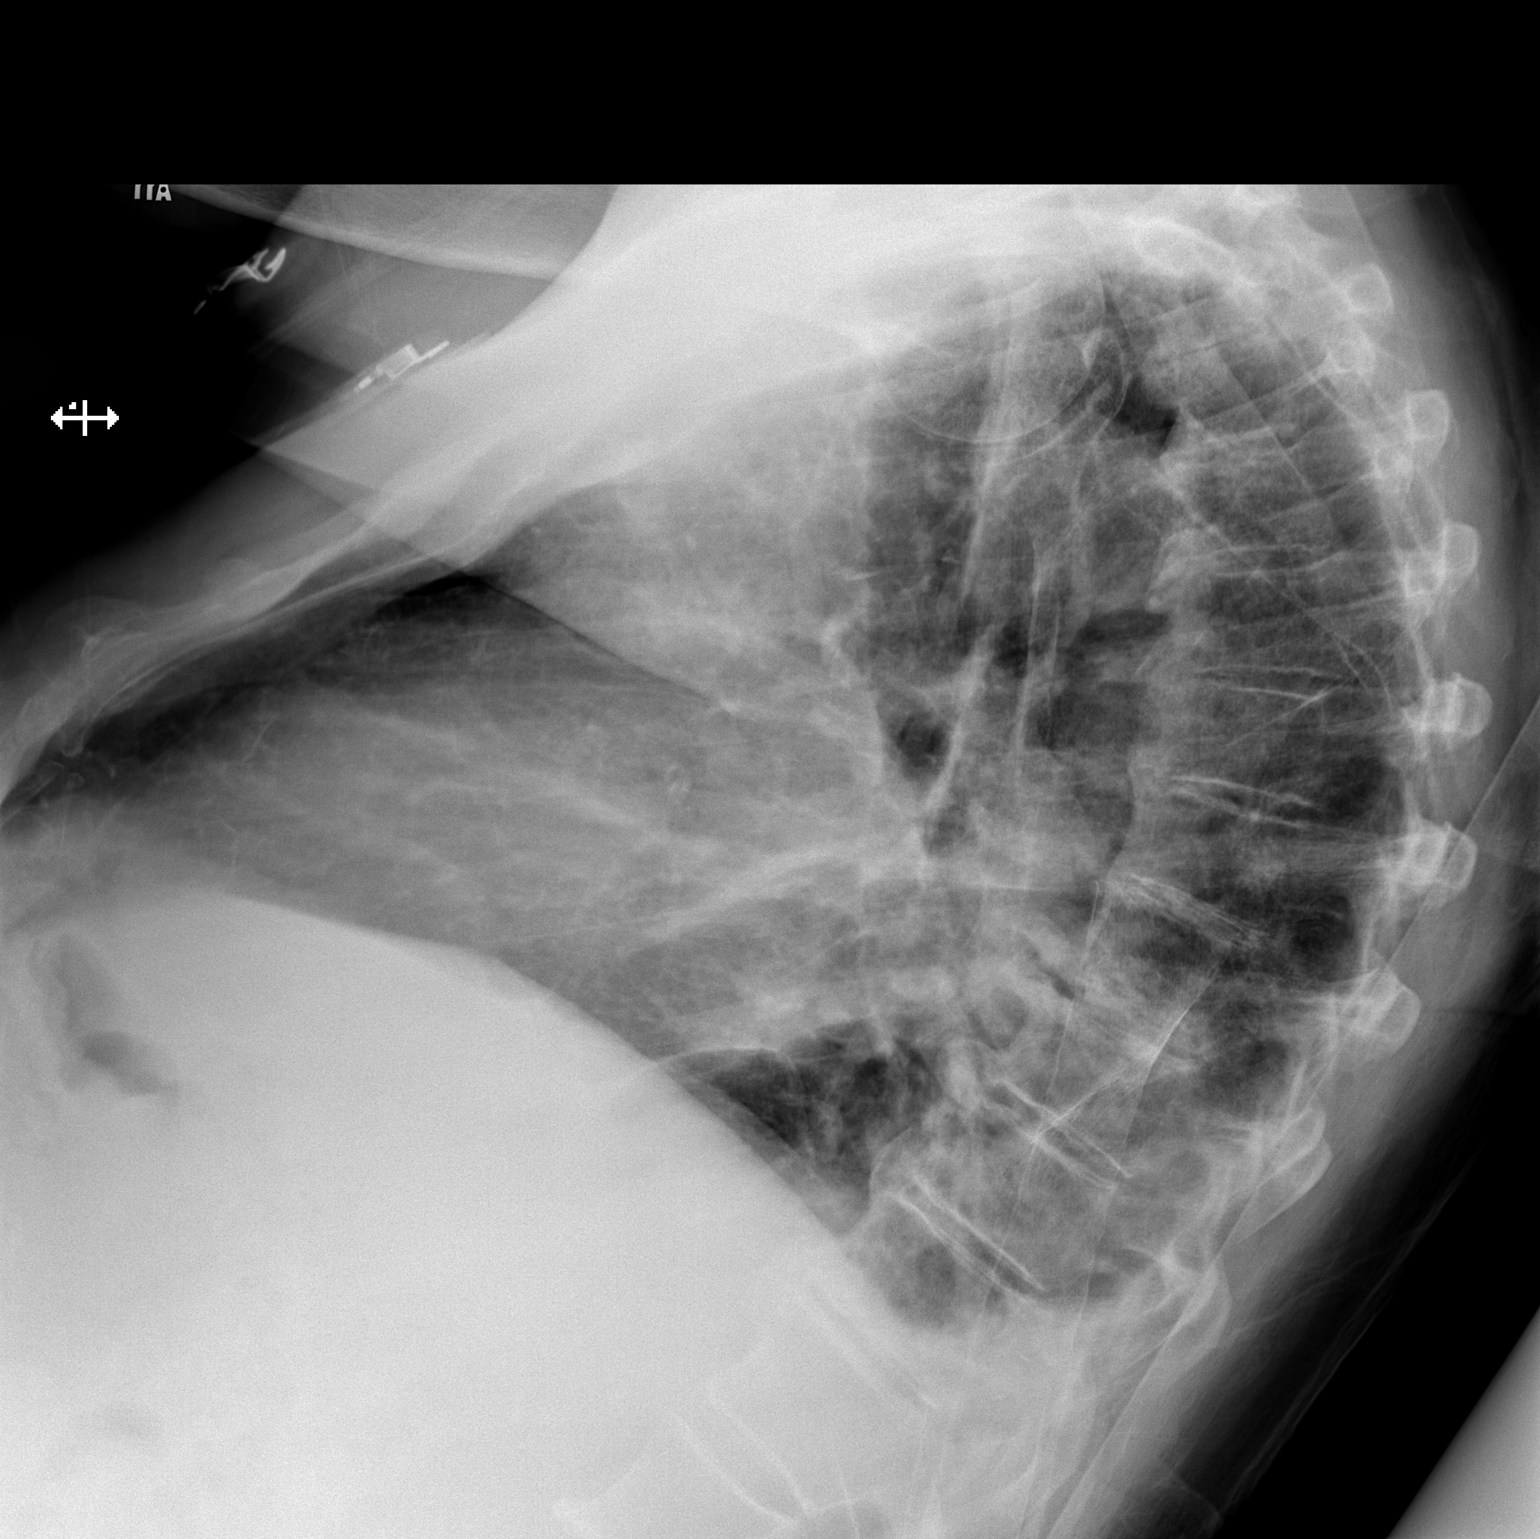

[2 of 2 positions shown; findings below may reference images not displayed]

FINDINGS: Mediastinum and hilar structures normal. Diffuse left lung
infiltrate. No pleural effusion or pneumothorax. Cardiomegaly with
normal pulmonary vascularity. No acute bony abnormality.
IMPRESSION: Diffuse left lung infiltrate consistent pneumonia. Followup PA and
lateral chest X-ray is recommended in 3-4 weeks following trial of
antibiotic therapy to ensure resolution and exclude underlying
malignancy.

## 2020-04-06 DIAGNOSIS — R2689 Other abnormalities of gait and mobility: Secondary | ICD-10-CM | POA: Diagnosis not present

## 2020-04-06 DIAGNOSIS — E1169 Type 2 diabetes mellitus with other specified complication: Secondary | ICD-10-CM | POA: Diagnosis not present

## 2020-04-06 DIAGNOSIS — E785 Hyperlipidemia, unspecified: Secondary | ICD-10-CM | POA: Diagnosis not present

## 2020-04-06 DIAGNOSIS — I110 Hypertensive heart disease with heart failure: Secondary | ICD-10-CM | POA: Diagnosis not present

## 2020-04-12 DIAGNOSIS — E785 Hyperlipidemia, unspecified: Secondary | ICD-10-CM | POA: Diagnosis not present

## 2020-04-12 DIAGNOSIS — I110 Hypertensive heart disease with heart failure: Secondary | ICD-10-CM | POA: Diagnosis not present

## 2020-04-12 DIAGNOSIS — R2689 Other abnormalities of gait and mobility: Secondary | ICD-10-CM | POA: Diagnosis not present

## 2020-04-12 DIAGNOSIS — E1169 Type 2 diabetes mellitus with other specified complication: Secondary | ICD-10-CM | POA: Diagnosis not present

## 2020-04-14 DIAGNOSIS — K219 Gastro-esophageal reflux disease without esophagitis: Secondary | ICD-10-CM | POA: Diagnosis not present

## 2020-04-14 DIAGNOSIS — W19XXXD Unspecified fall, subsequent encounter: Secondary | ICD-10-CM | POA: Diagnosis not present

## 2020-04-17 DIAGNOSIS — E785 Hyperlipidemia, unspecified: Secondary | ICD-10-CM | POA: Diagnosis not present

## 2020-04-17 DIAGNOSIS — I110 Hypertensive heart disease with heart failure: Secondary | ICD-10-CM | POA: Diagnosis not present

## 2020-04-17 DIAGNOSIS — R2689 Other abnormalities of gait and mobility: Secondary | ICD-10-CM | POA: Diagnosis not present

## 2020-04-17 DIAGNOSIS — E1169 Type 2 diabetes mellitus with other specified complication: Secondary | ICD-10-CM | POA: Diagnosis not present

## 2020-04-18 DIAGNOSIS — E785 Hyperlipidemia, unspecified: Secondary | ICD-10-CM | POA: Diagnosis not present

## 2020-04-18 DIAGNOSIS — I110 Hypertensive heart disease with heart failure: Secondary | ICD-10-CM | POA: Diagnosis not present

## 2020-04-18 DIAGNOSIS — R2689 Other abnormalities of gait and mobility: Secondary | ICD-10-CM | POA: Diagnosis not present

## 2020-04-18 DIAGNOSIS — E1169 Type 2 diabetes mellitus with other specified complication: Secondary | ICD-10-CM | POA: Diagnosis not present

## 2020-04-20 DIAGNOSIS — E785 Hyperlipidemia, unspecified: Secondary | ICD-10-CM | POA: Diagnosis not present

## 2020-04-20 DIAGNOSIS — E1169 Type 2 diabetes mellitus with other specified complication: Secondary | ICD-10-CM | POA: Diagnosis not present

## 2020-04-20 DIAGNOSIS — R2689 Other abnormalities of gait and mobility: Secondary | ICD-10-CM | POA: Diagnosis not present

## 2020-04-20 DIAGNOSIS — I110 Hypertensive heart disease with heart failure: Secondary | ICD-10-CM | POA: Diagnosis not present

## 2020-04-24 DIAGNOSIS — M6281 Muscle weakness (generalized): Secondary | ICD-10-CM | POA: Diagnosis not present

## 2020-04-24 DIAGNOSIS — E785 Hyperlipidemia, unspecified: Secondary | ICD-10-CM | POA: Diagnosis not present

## 2020-04-24 DIAGNOSIS — E1169 Type 2 diabetes mellitus with other specified complication: Secondary | ICD-10-CM | POA: Diagnosis not present

## 2020-04-24 DIAGNOSIS — R2689 Other abnormalities of gait and mobility: Secondary | ICD-10-CM | POA: Diagnosis not present

## 2020-04-24 DIAGNOSIS — I110 Hypertensive heart disease with heart failure: Secondary | ICD-10-CM | POA: Diagnosis not present

## 2020-04-25 DIAGNOSIS — R2689 Other abnormalities of gait and mobility: Secondary | ICD-10-CM | POA: Diagnosis not present

## 2020-04-25 DIAGNOSIS — E1169 Type 2 diabetes mellitus with other specified complication: Secondary | ICD-10-CM | POA: Diagnosis not present

## 2020-04-25 DIAGNOSIS — I110 Hypertensive heart disease with heart failure: Secondary | ICD-10-CM | POA: Diagnosis not present

## 2020-04-25 DIAGNOSIS — M6281 Muscle weakness (generalized): Secondary | ICD-10-CM | POA: Diagnosis not present

## 2020-04-25 DIAGNOSIS — E785 Hyperlipidemia, unspecified: Secondary | ICD-10-CM | POA: Diagnosis not present

## 2020-04-26 DIAGNOSIS — R2689 Other abnormalities of gait and mobility: Secondary | ICD-10-CM | POA: Diagnosis not present

## 2020-04-26 DIAGNOSIS — E1169 Type 2 diabetes mellitus with other specified complication: Secondary | ICD-10-CM | POA: Diagnosis not present

## 2020-04-26 DIAGNOSIS — E785 Hyperlipidemia, unspecified: Secondary | ICD-10-CM | POA: Diagnosis not present

## 2020-04-26 DIAGNOSIS — M6281 Muscle weakness (generalized): Secondary | ICD-10-CM | POA: Diagnosis not present

## 2020-04-26 DIAGNOSIS — I110 Hypertensive heart disease with heart failure: Secondary | ICD-10-CM | POA: Diagnosis not present

## 2020-04-27 DIAGNOSIS — M6281 Muscle weakness (generalized): Secondary | ICD-10-CM | POA: Diagnosis not present

## 2020-04-27 DIAGNOSIS — I110 Hypertensive heart disease with heart failure: Secondary | ICD-10-CM | POA: Diagnosis not present

## 2020-04-27 DIAGNOSIS — E1169 Type 2 diabetes mellitus with other specified complication: Secondary | ICD-10-CM | POA: Diagnosis not present

## 2020-04-27 DIAGNOSIS — E785 Hyperlipidemia, unspecified: Secondary | ICD-10-CM | POA: Diagnosis not present

## 2020-04-27 DIAGNOSIS — R2689 Other abnormalities of gait and mobility: Secondary | ICD-10-CM | POA: Diagnosis not present

## 2020-04-28 DIAGNOSIS — E785 Hyperlipidemia, unspecified: Secondary | ICD-10-CM | POA: Diagnosis not present

## 2020-04-28 DIAGNOSIS — M6281 Muscle weakness (generalized): Secondary | ICD-10-CM | POA: Diagnosis not present

## 2020-04-28 DIAGNOSIS — E1169 Type 2 diabetes mellitus with other specified complication: Secondary | ICD-10-CM | POA: Diagnosis not present

## 2020-04-28 DIAGNOSIS — I110 Hypertensive heart disease with heart failure: Secondary | ICD-10-CM | POA: Diagnosis not present

## 2020-04-28 DIAGNOSIS — R2689 Other abnormalities of gait and mobility: Secondary | ICD-10-CM | POA: Diagnosis not present

## 2020-04-30 DIAGNOSIS — K219 Gastro-esophageal reflux disease without esophagitis: Secondary | ICD-10-CM | POA: Diagnosis not present

## 2020-05-02 DIAGNOSIS — M6281 Muscle weakness (generalized): Secondary | ICD-10-CM | POA: Diagnosis not present

## 2020-05-02 DIAGNOSIS — E785 Hyperlipidemia, unspecified: Secondary | ICD-10-CM | POA: Diagnosis not present

## 2020-05-02 DIAGNOSIS — R2689 Other abnormalities of gait and mobility: Secondary | ICD-10-CM | POA: Diagnosis not present

## 2020-05-02 DIAGNOSIS — E1169 Type 2 diabetes mellitus with other specified complication: Secondary | ICD-10-CM | POA: Diagnosis not present

## 2020-05-02 DIAGNOSIS — I110 Hypertensive heart disease with heart failure: Secondary | ICD-10-CM | POA: Diagnosis not present

## 2020-05-03 ENCOUNTER — Ambulatory Visit: Payer: Medicare Other | Admitting: Podiatry

## 2020-05-04 DIAGNOSIS — M6281 Muscle weakness (generalized): Secondary | ICD-10-CM | POA: Diagnosis not present

## 2020-05-04 DIAGNOSIS — H40013 Open angle with borderline findings, low risk, bilateral: Secondary | ICD-10-CM | POA: Diagnosis not present

## 2020-05-04 DIAGNOSIS — R2689 Other abnormalities of gait and mobility: Secondary | ICD-10-CM | POA: Diagnosis not present

## 2020-05-04 DIAGNOSIS — E785 Hyperlipidemia, unspecified: Secondary | ICD-10-CM | POA: Diagnosis not present

## 2020-05-04 DIAGNOSIS — I110 Hypertensive heart disease with heart failure: Secondary | ICD-10-CM | POA: Diagnosis not present

## 2020-05-04 DIAGNOSIS — H31092 Other chorioretinal scars, left eye: Secondary | ICD-10-CM | POA: Diagnosis not present

## 2020-05-04 DIAGNOSIS — E119 Type 2 diabetes mellitus without complications: Secondary | ICD-10-CM | POA: Diagnosis not present

## 2020-05-04 DIAGNOSIS — H35033 Hypertensive retinopathy, bilateral: Secondary | ICD-10-CM | POA: Diagnosis not present

## 2020-05-04 DIAGNOSIS — E1169 Type 2 diabetes mellitus with other specified complication: Secondary | ICD-10-CM | POA: Diagnosis not present

## 2020-05-08 DIAGNOSIS — I110 Hypertensive heart disease with heart failure: Secondary | ICD-10-CM | POA: Diagnosis not present

## 2020-05-08 DIAGNOSIS — E785 Hyperlipidemia, unspecified: Secondary | ICD-10-CM | POA: Diagnosis not present

## 2020-05-08 DIAGNOSIS — R2689 Other abnormalities of gait and mobility: Secondary | ICD-10-CM | POA: Diagnosis not present

## 2020-05-08 DIAGNOSIS — E1169 Type 2 diabetes mellitus with other specified complication: Secondary | ICD-10-CM | POA: Diagnosis not present

## 2020-05-08 DIAGNOSIS — M6281 Muscle weakness (generalized): Secondary | ICD-10-CM | POA: Diagnosis not present

## 2020-05-09 DIAGNOSIS — R2689 Other abnormalities of gait and mobility: Secondary | ICD-10-CM | POA: Diagnosis not present

## 2020-05-09 DIAGNOSIS — E1169 Type 2 diabetes mellitus with other specified complication: Secondary | ICD-10-CM | POA: Diagnosis not present

## 2020-05-09 DIAGNOSIS — I110 Hypertensive heart disease with heart failure: Secondary | ICD-10-CM | POA: Diagnosis not present

## 2020-05-09 DIAGNOSIS — E785 Hyperlipidemia, unspecified: Secondary | ICD-10-CM | POA: Diagnosis not present

## 2020-05-09 DIAGNOSIS — M6281 Muscle weakness (generalized): Secondary | ICD-10-CM | POA: Diagnosis not present

## 2020-05-10 DIAGNOSIS — E1122 Type 2 diabetes mellitus with diabetic chronic kidney disease: Secondary | ICD-10-CM | POA: Diagnosis not present

## 2020-05-10 DIAGNOSIS — I110 Hypertensive heart disease with heart failure: Secondary | ICD-10-CM | POA: Diagnosis not present

## 2020-05-10 DIAGNOSIS — E785 Hyperlipidemia, unspecified: Secondary | ICD-10-CM | POA: Diagnosis not present

## 2020-05-10 DIAGNOSIS — M6281 Muscle weakness (generalized): Secondary | ICD-10-CM | POA: Diagnosis not present

## 2020-05-10 DIAGNOSIS — R2689 Other abnormalities of gait and mobility: Secondary | ICD-10-CM | POA: Diagnosis not present

## 2020-05-10 DIAGNOSIS — E1169 Type 2 diabetes mellitus with other specified complication: Secondary | ICD-10-CM | POA: Diagnosis not present

## 2020-05-11 DIAGNOSIS — I110 Hypertensive heart disease with heart failure: Secondary | ICD-10-CM | POA: Diagnosis not present

## 2020-05-11 DIAGNOSIS — M6281 Muscle weakness (generalized): Secondary | ICD-10-CM | POA: Diagnosis not present

## 2020-05-11 DIAGNOSIS — E1169 Type 2 diabetes mellitus with other specified complication: Secondary | ICD-10-CM | POA: Diagnosis not present

## 2020-05-11 DIAGNOSIS — E785 Hyperlipidemia, unspecified: Secondary | ICD-10-CM | POA: Diagnosis not present

## 2020-05-11 DIAGNOSIS — R2689 Other abnormalities of gait and mobility: Secondary | ICD-10-CM | POA: Diagnosis not present

## 2020-05-12 DIAGNOSIS — I110 Hypertensive heart disease with heart failure: Secondary | ICD-10-CM | POA: Diagnosis not present

## 2020-05-12 DIAGNOSIS — E1169 Type 2 diabetes mellitus with other specified complication: Secondary | ICD-10-CM | POA: Diagnosis not present

## 2020-05-12 DIAGNOSIS — E785 Hyperlipidemia, unspecified: Secondary | ICD-10-CM | POA: Diagnosis not present

## 2020-05-12 DIAGNOSIS — M6281 Muscle weakness (generalized): Secondary | ICD-10-CM | POA: Diagnosis not present

## 2020-05-12 DIAGNOSIS — R2689 Other abnormalities of gait and mobility: Secondary | ICD-10-CM | POA: Diagnosis not present

## 2020-05-15 DIAGNOSIS — E1169 Type 2 diabetes mellitus with other specified complication: Secondary | ICD-10-CM | POA: Diagnosis not present

## 2020-05-15 DIAGNOSIS — R2689 Other abnormalities of gait and mobility: Secondary | ICD-10-CM | POA: Diagnosis not present

## 2020-05-15 DIAGNOSIS — M6281 Muscle weakness (generalized): Secondary | ICD-10-CM | POA: Diagnosis not present

## 2020-05-15 DIAGNOSIS — E785 Hyperlipidemia, unspecified: Secondary | ICD-10-CM | POA: Diagnosis not present

## 2020-05-15 DIAGNOSIS — I110 Hypertensive heart disease with heart failure: Secondary | ICD-10-CM | POA: Diagnosis not present

## 2020-05-16 DIAGNOSIS — E1169 Type 2 diabetes mellitus with other specified complication: Secondary | ICD-10-CM | POA: Diagnosis not present

## 2020-05-16 DIAGNOSIS — R2689 Other abnormalities of gait and mobility: Secondary | ICD-10-CM | POA: Diagnosis not present

## 2020-05-16 DIAGNOSIS — E785 Hyperlipidemia, unspecified: Secondary | ICD-10-CM | POA: Diagnosis not present

## 2020-05-16 DIAGNOSIS — I110 Hypertensive heart disease with heart failure: Secondary | ICD-10-CM | POA: Diagnosis not present

## 2020-05-16 DIAGNOSIS — M6281 Muscle weakness (generalized): Secondary | ICD-10-CM | POA: Diagnosis not present

## 2020-05-17 ENCOUNTER — Ambulatory Visit (INDEPENDENT_AMBULATORY_CARE_PROVIDER_SITE_OTHER): Payer: Medicare Other | Admitting: Podiatry

## 2020-05-17 ENCOUNTER — Encounter: Payer: Self-pay | Admitting: Podiatry

## 2020-05-17 ENCOUNTER — Other Ambulatory Visit: Payer: Self-pay

## 2020-05-17 DIAGNOSIS — B351 Tinea unguium: Secondary | ICD-10-CM

## 2020-05-17 DIAGNOSIS — R2689 Other abnormalities of gait and mobility: Secondary | ICD-10-CM | POA: Diagnosis not present

## 2020-05-17 DIAGNOSIS — E785 Hyperlipidemia, unspecified: Secondary | ICD-10-CM | POA: Diagnosis not present

## 2020-05-17 DIAGNOSIS — K449 Diaphragmatic hernia without obstruction or gangrene: Secondary | ICD-10-CM | POA: Insufficient documentation

## 2020-05-17 DIAGNOSIS — N3941 Urge incontinence: Secondary | ICD-10-CM | POA: Insufficient documentation

## 2020-05-17 DIAGNOSIS — M2042 Other hammer toe(s) (acquired), left foot: Secondary | ICD-10-CM | POA: Diagnosis not present

## 2020-05-17 DIAGNOSIS — Z993 Dependence on wheelchair: Secondary | ICD-10-CM | POA: Insufficient documentation

## 2020-05-17 DIAGNOSIS — M79609 Pain in unspecified limb: Secondary | ICD-10-CM | POA: Diagnosis not present

## 2020-05-17 DIAGNOSIS — E1144 Type 2 diabetes mellitus with diabetic amyotrophy: Secondary | ICD-10-CM | POA: Insufficient documentation

## 2020-05-17 DIAGNOSIS — M2041 Other hammer toe(s) (acquired), right foot: Secondary | ICD-10-CM

## 2020-05-17 DIAGNOSIS — E1169 Type 2 diabetes mellitus with other specified complication: Secondary | ICD-10-CM | POA: Diagnosis not present

## 2020-05-17 DIAGNOSIS — E1159 Type 2 diabetes mellitus with other circulatory complications: Secondary | ICD-10-CM | POA: Diagnosis not present

## 2020-05-17 DIAGNOSIS — I110 Hypertensive heart disease with heart failure: Secondary | ICD-10-CM | POA: Diagnosis not present

## 2020-05-17 DIAGNOSIS — M6281 Muscle weakness (generalized): Secondary | ICD-10-CM | POA: Diagnosis not present

## 2020-05-17 DIAGNOSIS — M75101 Unspecified rotator cuff tear or rupture of right shoulder, not specified as traumatic: Secondary | ICD-10-CM | POA: Insufficient documentation

## 2020-05-17 DIAGNOSIS — Z9889 Other specified postprocedural states: Secondary | ICD-10-CM | POA: Insufficient documentation

## 2020-05-17 NOTE — Progress Notes (Signed)
This patient returns to my office for at risk foot care.  This patient requires this care by a professional since this patient will be at risk due to having CKD and diabetes.  This patient is unable to cut nails himself since the patient cannot reach his nails.These nails are painful walking and wearing shoes. This patient presents to the office in a wheelchair.  Patient has not been seen in over 16 months. This patient presents for at risk foot care today.  General Appearance  Alert, conversant and in no acute stress.  Vascular  Dorsalis pedis and posterior tibial  pulses are weakly  palpable  bilaterally.  Capillary return is within normal limits  bilaterally. Cold feet  Bilaterally. Absent digital hair B/L.  Neurologic  Senn-Weinstein monofilament wire test diminished   bilaterally. Muscle power within normal limits bilaterally.  Nails Thick disfigured discolored nails with subungual debris  from hallux to fifth toes bilaterally. No evidence of bacterial infection or drainage bilaterally.  Orthopedic  No limitations of motion  feet .  No crepitus or effusions noted.  No bony pathology or digital deformities noted.  Skin  normotropic skin with no porokeratosis noted bilaterally.  No signs of infections or ulcers noted.     Onychomycosis  Pain in right toes  Pain in left toes  Consent was obtained for treatment procedures.   Mechanical debridement of nails 1-5  bilaterally performed with a nail nipper.  Since patient has not been seen in over 16 months ,his fourth toenail was avulsed and upon removing the unattached nail an iatrogenic lesion occurred.  Cauterized his fourth toe left foot/DSD.  RTC 3 months.   Return office visit   3 months                  Told patient to return for periodic foot care and evaluation due to potential at risk complications.   Gardiner Barefoot DPM

## 2020-05-18 DIAGNOSIS — E785 Hyperlipidemia, unspecified: Secondary | ICD-10-CM | POA: Diagnosis not present

## 2020-05-18 DIAGNOSIS — E1169 Type 2 diabetes mellitus with other specified complication: Secondary | ICD-10-CM | POA: Diagnosis not present

## 2020-05-18 DIAGNOSIS — M6281 Muscle weakness (generalized): Secondary | ICD-10-CM | POA: Diagnosis not present

## 2020-05-18 DIAGNOSIS — R2689 Other abnormalities of gait and mobility: Secondary | ICD-10-CM | POA: Diagnosis not present

## 2020-05-18 DIAGNOSIS — I110 Hypertensive heart disease with heart failure: Secondary | ICD-10-CM | POA: Diagnosis not present

## 2020-05-22 DIAGNOSIS — I110 Hypertensive heart disease with heart failure: Secondary | ICD-10-CM | POA: Diagnosis not present

## 2020-05-22 DIAGNOSIS — R2689 Other abnormalities of gait and mobility: Secondary | ICD-10-CM | POA: Diagnosis not present

## 2020-05-22 DIAGNOSIS — E785 Hyperlipidemia, unspecified: Secondary | ICD-10-CM | POA: Diagnosis not present

## 2020-05-22 DIAGNOSIS — E1169 Type 2 diabetes mellitus with other specified complication: Secondary | ICD-10-CM | POA: Diagnosis not present

## 2020-05-22 DIAGNOSIS — M6281 Muscle weakness (generalized): Secondary | ICD-10-CM | POA: Diagnosis not present

## 2020-05-23 DIAGNOSIS — E1169 Type 2 diabetes mellitus with other specified complication: Secondary | ICD-10-CM | POA: Diagnosis not present

## 2020-05-23 DIAGNOSIS — E785 Hyperlipidemia, unspecified: Secondary | ICD-10-CM | POA: Diagnosis not present

## 2020-05-23 DIAGNOSIS — I110 Hypertensive heart disease with heart failure: Secondary | ICD-10-CM | POA: Diagnosis not present

## 2020-05-23 DIAGNOSIS — R2689 Other abnormalities of gait and mobility: Secondary | ICD-10-CM | POA: Diagnosis not present

## 2020-05-23 DIAGNOSIS — M6281 Muscle weakness (generalized): Secondary | ICD-10-CM | POA: Diagnosis not present

## 2020-05-24 DIAGNOSIS — M6281 Muscle weakness (generalized): Secondary | ICD-10-CM | POA: Diagnosis not present

## 2020-05-24 DIAGNOSIS — R2689 Other abnormalities of gait and mobility: Secondary | ICD-10-CM | POA: Diagnosis not present

## 2020-05-24 DIAGNOSIS — E1169 Type 2 diabetes mellitus with other specified complication: Secondary | ICD-10-CM | POA: Diagnosis not present

## 2020-05-24 DIAGNOSIS — E785 Hyperlipidemia, unspecified: Secondary | ICD-10-CM | POA: Diagnosis not present

## 2020-05-24 DIAGNOSIS — I110 Hypertensive heart disease with heart failure: Secondary | ICD-10-CM | POA: Diagnosis not present

## 2020-05-25 ENCOUNTER — Telehealth: Payer: Self-pay | Admitting: Psychiatry

## 2020-05-25 DIAGNOSIS — G4709 Other insomnia: Secondary | ICD-10-CM | POA: Diagnosis not present

## 2020-05-25 DIAGNOSIS — M6281 Muscle weakness (generalized): Secondary | ICD-10-CM | POA: Diagnosis not present

## 2020-05-25 DIAGNOSIS — I110 Hypertensive heart disease with heart failure: Secondary | ICD-10-CM | POA: Diagnosis not present

## 2020-05-25 DIAGNOSIS — E785 Hyperlipidemia, unspecified: Secondary | ICD-10-CM | POA: Diagnosis not present

## 2020-05-25 DIAGNOSIS — E119 Type 2 diabetes mellitus without complications: Secondary | ICD-10-CM | POA: Diagnosis not present

## 2020-05-25 DIAGNOSIS — R2689 Other abnormalities of gait and mobility: Secondary | ICD-10-CM | POA: Diagnosis not present

## 2020-05-25 DIAGNOSIS — E1169 Type 2 diabetes mellitus with other specified complication: Secondary | ICD-10-CM | POA: Diagnosis not present

## 2020-05-25 NOTE — Telephone Encounter (Signed)
Pt left a message that he needs something called in to help him sleep. Please call [phone].

## 2020-05-26 DIAGNOSIS — M6281 Muscle weakness (generalized): Secondary | ICD-10-CM | POA: Diagnosis not present

## 2020-05-26 DIAGNOSIS — E1169 Type 2 diabetes mellitus with other specified complication: Secondary | ICD-10-CM | POA: Diagnosis not present

## 2020-05-26 DIAGNOSIS — R2689 Other abnormalities of gait and mobility: Secondary | ICD-10-CM | POA: Diagnosis not present

## 2020-05-26 DIAGNOSIS — E785 Hyperlipidemia, unspecified: Secondary | ICD-10-CM | POA: Diagnosis not present

## 2020-05-26 DIAGNOSIS — I110 Hypertensive heart disease with heart failure: Secondary | ICD-10-CM | POA: Diagnosis not present

## 2020-06-12 DIAGNOSIS — E559 Vitamin D deficiency, unspecified: Secondary | ICD-10-CM | POA: Diagnosis not present

## 2020-06-12 DIAGNOSIS — R799 Abnormal finding of blood chemistry, unspecified: Secondary | ICD-10-CM | POA: Diagnosis not present

## 2020-06-12 DIAGNOSIS — R7309 Other abnormal glucose: Secondary | ICD-10-CM | POA: Diagnosis not present

## 2020-06-12 DIAGNOSIS — Z13228 Encounter for screening for other metabolic disorders: Secondary | ICD-10-CM | POA: Diagnosis not present

## 2020-06-12 DIAGNOSIS — R946 Abnormal results of thyroid function studies: Secondary | ICD-10-CM | POA: Diagnosis not present

## 2020-06-13 DIAGNOSIS — I10 Essential (primary) hypertension: Secondary | ICD-10-CM | POA: Diagnosis not present

## 2020-06-13 DIAGNOSIS — E119 Type 2 diabetes mellitus without complications: Secondary | ICD-10-CM | POA: Diagnosis not present

## 2020-06-18 DIAGNOSIS — I10 Essential (primary) hypertension: Secondary | ICD-10-CM | POA: Diagnosis not present

## 2020-06-18 DIAGNOSIS — K219 Gastro-esophageal reflux disease without esophagitis: Secondary | ICD-10-CM | POA: Diagnosis not present

## 2020-07-03 DIAGNOSIS — E119 Type 2 diabetes mellitus without complications: Secondary | ICD-10-CM | POA: Diagnosis not present

## 2020-07-15 DIAGNOSIS — K219 Gastro-esophageal reflux disease without esophagitis: Secondary | ICD-10-CM | POA: Diagnosis not present

## 2020-07-18 DIAGNOSIS — I10 Essential (primary) hypertension: Secondary | ICD-10-CM | POA: Diagnosis not present

## 2020-07-19 ENCOUNTER — Other Ambulatory Visit: Payer: Self-pay

## 2020-07-19 ENCOUNTER — Encounter: Payer: Self-pay | Admitting: Psychiatry

## 2020-07-19 ENCOUNTER — Ambulatory Visit (INDEPENDENT_AMBULATORY_CARE_PROVIDER_SITE_OTHER): Payer: Medicare Other | Admitting: Psychiatry

## 2020-07-19 DIAGNOSIS — F0281 Dementia in other diseases classified elsewhere with behavioral disturbance: Secondary | ICD-10-CM

## 2020-07-19 DIAGNOSIS — G301 Alzheimer's disease with late onset: Secondary | ICD-10-CM | POA: Diagnosis not present

## 2020-07-19 DIAGNOSIS — F422 Mixed obsessional thoughts and acts: Secondary | ICD-10-CM

## 2020-07-19 DIAGNOSIS — F200 Paranoid schizophrenia: Secondary | ICD-10-CM | POA: Diagnosis not present

## 2020-07-19 DIAGNOSIS — F411 Generalized anxiety disorder: Secondary | ICD-10-CM | POA: Diagnosis not present

## 2020-07-19 DIAGNOSIS — F5105 Insomnia due to other mental disorder: Secondary | ICD-10-CM

## 2020-07-19 DIAGNOSIS — F02818 Dementia in other diseases classified elsewhere, unspecified severity, with other behavioral disturbance: Secondary | ICD-10-CM

## 2020-07-19 NOTE — Progress Notes (Signed)
Ricky [name] [id number] 09-Feb-1940 80 y.o.     Subjective:   Patient ID:  Ricky [name]. is a 80 y.o. (DOB October 11, 1940) male.  Chief Complaint:  Chief Complaint  Patient presents with   Follow-up   Anxiety   Hallucinations   Schizophrenia    Depression        Associated symptoms include decreased concentration and fatigue.  Associated symptoms include no headaches and no suicidal ideas. [name]. presents to the office today for follow-up of schizophrenia and dementia.  Resident of [facility].  seen April 15, 2019.  No meds were changed.  As of 06/03/2019 the following is noted: Staff reports patient calls very frequently and has for an extended period of time.  He is chronically somewhat needy and lonely as well as has a tendency for reassurance checking. Still bothered by religious thoughts and can't tell if it's from God than or Satan.  Supernatural things get in his mind.  Also thinks that people there don't like him at [facility].  Also upset at Biden's spending excess.  Wants to vote for [name].  Also bothered he doesn't get to do much.    Had thoughts he needed to go to the hospital one night for [place] reasons but staff encouraged him to wait it out and he felt better the next day. Poor STM. No med changes  9/20/21appt with the following noted: Worries over losing trusted banker.  Worries money is running out.  Worries about getting Covid.  Memory is worse.  Gets obsessive fears about his salvation especially  Listening to [place] radio at times but has chronically worried over it.   Hears voices talking about his mental health and to listen to radio.   Don't have a whole lot of them but enough to get him upset from time to time.. Not markedly depressed. Plan no med changes  02/21/2020 appointment with the following noted: [place] some noise, ring in his head but not voices. Worries about it. They take good care of me over there.  Listens to  radio a lot at night. And doesn't get a lot of sleep at night but will nap daytime. Reads bible but often doesn't understand or remember it. No SE. Calls on [facility] to help him emotionally and spiritually.   LTM good.  More poor STM.    Wonders if it's possible that he'll ever get well.    Watched NFL football yesterday and enjoyed it. Plan: No med changes  07/19/2020 appointment with the following noted: Has had to call couple times after hours bc was ruminating on past issues.  Today feels OK. Recognizes problems with STM but in his heart feels it will get better. Food good at [employer].  People are treating him well except with one man.  [name] avoids him. Still on olanzapine 25 mg HS and fluovxamine 100, zolpidem 5 HS, Xanax 0.25 mg prn. Can't afford caregiver to take him out daily as in the past. Wonders what doc thinks his IQ would have been.   No concerns about med except wants one to make him well.  Past Psychiatric Medication Trials:  All of them are unknown,  haloperidol alone, serentil, Stelazine, Moban, perphenazine, risperidone, Thorazine, Seroquel 800 mg, loxapine, been on Zyprexa since September 2006 and that has produced the best control of his paranoia at 25 mg daily. benztropine,    paroxetine, sertraline side effects,   Hydroxyzine, trazodone no response,  Xanax,  Review  of Systems:  Review of Systems  Constitutional:  Positive for fatigue.  HENT:  Positive for tinnitus.   Cardiovascular:  Negative for palpitations.  Genitourinary:  Positive for enuresis.  Musculoskeletal:  Positive for arthralgias, back pain and gait problem.  Neurological:  Positive for dizziness, tremors and weakness. Negative for headaches.       WC bound  Psychiatric/Behavioral:  Positive for confusion, decreased concentration, depression, hallucinations and sleep disturbance. Negative for agitation, self-injury and suicidal ideas. The patient is nervous/anxious. The patient is not hyperactive.     Medications: I have reviewed the patient's current medications.  Current Outpatient Medications  Medication Sig Dispense Refill   ALPRAZolam (XANAX) 0.5 MG tablet Take 1 tablet (0.5 mg total) by mouth at bedtime. (Patient taking differently: Take 0.25 mg by mouth at bedtime.) 2 tablet 0   amLODipine (NORVASC) 2.5 MG tablet      amLODipine (NORVASC) 5 MG tablet Take 5 mg by mouth daily.     Calcium Citrate-Vitamin D (CITRACAL + D PO) Take 1 tablet by mouth daily.     Cholecalciferol (VITAMIN D-3) 1000 units CAPS Take 1 capsule by mouth daily.     docusate sodium (COLACE) 100 MG capsule Take 100 mg by mouth 2 (two) times daily.     empagliflozin (JARDIANCE) 10 MG TABS tablet Take 10 mg by mouth daily. 2 tablet 0   fluvoxaMINE (LUVOX) 100 MG tablet Take 1 tablet (100 mg total) by mouth at bedtime. 2 tablet 0   furosemide (LASIX) 20 MG tablet Take 20 mg by mouth daily before breakfast.      HUMALOG KWIKPEN 100 UNIT/ML KiwkPen Inject 2-11 Units into the skin See admin instructions. Times: 0630 1130 1630  Scale: 101-150: 2 units 151-200: 3 units 201-250: 5 units 251-300: 7 units 301-350: 9 units >350: 11 units     hydrOXYzine (VISTARIL) 25 MG capsule      insulin detemir (LEVEMIR) 100 UNIT/ML injection Inject 0.3 mLs (30 Units total) into the skin at bedtime. (Patient taking differently: Inject 24 Units into the skin at bedtime.) 10 mL 11   insulin glargine (LANTUS) 100 UNIT/ML injection 25 u     insulin regular (NOVOLIN R) 100 units/mL injection ss     meloxicam (MOBIC) 15 MG tablet 1 tablet     Menthol, Topical Analgesic, (BIOFREEZE) 4 % GEL Apply topically.     metFORMIN (GLUCOPHAGE) 500 MG tablet 1 tablet with meals     Multiple Vitamins-Minerals (THERAGRAN-M PREMIER 50 PLUS PO) Take 1 tablet by mouth daily.     MYRBETRIQ 25 MG TB24 tablet Take 25 mg by mouth daily.      OLANZapine (ZYPREXA) 5 MG tablet Take 5 tablets (25 mg total) by mouth daily. 2 tablet 0   Omega-3 Fatty Acids  (FISH OIL) 1200 MG CAPS      omeprazole (PRILOSEC) 40 MG capsule Take 40 mg by mouth daily.      polyethylene glycol powder (GLYCOLAX/MIRALAX) 17 GM/SCOOP powder Take 1 Container by mouth once.     pravastatin (PRAVACHOL) 40 MG tablet Take 40 mg by mouth daily after lunch.      tamsulosin (FLOMAX) 0.4 MG CAPS capsule Take 0.4 mg by mouth daily.      zolpidem (AMBIEN) 5 MG tablet Take 1 tablet (5 mg total) by mouth at bedtime. 2 tablet 0   No current facility-administered medications for this visit.    Medication Side Effects: Other: dry mouth  Allergies:  Allergies  Allergen Reactions  Asa [Aspirin]     "Dr told me not to take it"   Codeine Other (See Comments)    DIZZINESS with Tylenol 3   Tylenol With Codeine #3 [Acetaminophen-Codeine]     Other reaction(s): Unknown   Tylenol [Acetaminophen] Other (See Comments)    Dizziness with tylenol 3    Past Medical History:  Diagnosis Date   Abnormality of gait    Adenomatous colon polyp    Arthritis    Cataract    Dementia ([place])    Depression    Diabetes mellitus without complication ([place])    Fatty liver    Hyperlipidemia    Hypertension    Internal hemorrhoids    Other malaise and fatigue    Peripheral edema    Schizophrenia ([place])    Type II or unspecified type diabetes mellitus without mention of complication, not stated as uncontrolled    Urinary frequency    Urinary retention     Family History  Problem Relation Age of Onset   Brain cancer Father     Social History   Socioeconomic History   Marital status: Single    Spouse name: Not on file   Number of children: Not on file   Years of education: college   Highest education level: Not on file  Occupational History    Employer: RETIRED    Comment: Disabled  Tobacco Use   Smoking status: Never   Smokeless tobacco: Never  Substance and Sexual Activity   Alcohol use: No   Drug use: No   Sexual activity: Not on file  Other Topics Concern   Not on file   Social History Narrative   Patient is disabled and he has not worked since 1962. Patient has some college education.Patient drinks 2 cups of coffee daily.   Right handed.   Social Determinants of Health   Financial Resource Strain: Not on file  Food Insecurity: Not on file  Transportation Needs: Not on file  Physical Activity: Not on file  Stress: Not on file  Social Connections: Not on file  Intimate Partner Violence: Not on file    Past Medical History, Surgical history, Social history, and Family history were reviewed and updated as appropriate.   Please see review of systems for further details on the patient's review from today.   Objective:   Physical Exam:  There were no vitals taken for this visit.  Physical Exam Constitutional:      Appearance: He is obese.  Neurological:     Mental Status: He is alert and oriented to person, place, and time.     Cranial Nerves: Dysarthria present.     Motor: Weakness present.     Gait: Gait abnormal.     Comments: Mild dysarthria chronic   Psychiatric:        Attention and Perception: Attention normal. He is attentive. He perceives auditory hallucinations. He does not perceive visual hallucinations.        Mood and Affect: Mood is anxious. Mood is not depressed. Affect is not angry or tearful.        Speech: Speech normal. Speech is not rapid and pressured or slurred.        Behavior: Behavior is slowed. Behavior is not agitated or aggressive. Behavior is cooperative.        Thought Content: Thought content is paranoid and delusional. Thought content does not include homicidal or suicidal ideation. Thought content does not include homicidal or suicidal plan.  Cognition and Memory: Cognition is impaired. Memory is impaired. He does not exhibit impaired recent memory.     Comments: Chronic voices some better. Fair insight and judgment. Talkative and repeats himself some. Worsening memory. Loses words at times Reassurance  seeking chronically. Some IOR re commericials on TV ongoing not disturbing. Obsessive spiritual thought and compulsive behaviors are prominent but manageable.  Less paranoid than usual. Affect pretty calm.  He agrees    Lab Review:     Component Value Date/Time   NA 139 01/22/2018 0339   K 3.6 01/22/2018 0339   CL 100 01/22/2018 0339   CO2 28 01/22/2018 0339   GLUCOSE 231 (H) 01/22/2018 0339   BUN 15 01/22/2018 0339   CREATININE 0.96 01/22/2018 0339   CALCIUM 9.2 01/22/2018 0339   PROT 7.0 01/22/2018 0339   ALBUMIN 3.3 (L) 01/22/2018 0339   AST 35 01/22/2018 0339   ALT 38 01/22/2018 0339   ALKPHOS 71 01/22/2018 0339   BILITOT 0.6 01/22/2018 0339   GFRNONAA >60 01/22/2018 0339   GFRAA >60 01/22/2018 0339       Component Value Date/Time   WBC 6.8 01/22/2018 0339   RBC 4.48 01/22/2018 0339   HGB 12.9 (L) 01/22/2018 0339   HCT 41.7 01/22/2018 0339   PLT 211 01/22/2018 0339   MCV 93.1 01/22/2018 0339   MCH 28.8 01/22/2018 0339   MCHC 30.9 01/22/2018 0339   RDW 14.0 01/22/2018 0339   LYMPHSABS 1.8 01/22/2018 0339   MONOABS 0.6 01/22/2018 0339   EOSABS 0.3 01/22/2018 0339   BASOSABS 0.1 01/22/2018 0339    No results found for: POCLITH, LITHIUM   No results found for: PHENYTOIN, PHENOBARB, VALPROATE, CBMZ   .res Assessment: Plan:    Bryker was seen today for follow-up, anxiety, hallucinations and schizophrenia.  Diagnoses and all orders for this visit:  Schizophrenia, paranoid ([place])  Mixed obsessional thoughts and acts  Generalized anxiety disorder  Late onset Alzheimer's disease with behavioral disturbance ([place])  Insomnia due to mental condition  Greater than 50% of 30 min face to face time with patient was spent on counseling and coordination of care. We discussed several topics as noted: Reviewed documents today from [facility] including med list.   Patient has been under my psychiatric years since 1998 .unlikely that med change will reduce the  paranoia and auditory hallucinations which are chronic. Voices are better and paranoia appears a little better but varies.  Reports still cooperative with staff.  He has been on multiple psychiatric medications and has done best on this combination of Zyprexa which was increased to 25 mg April 09, 2018, a minimal dose of alprazolam 0.25 mg HS, and fluvoxamine 100 mg daily. Is cooperative genereally.   Consider stopping alprazolam 0.25 mg HS as long as he's sleeping well.  He also still has obsessive and intrusive fears about losing his salvation but they are chronic and have not been easily managed.  Given his age and the chronicity of the symptoms med changes are unlikely to help.  Overall it appears a little better at present. Overall also paranoia is better than other times.  Needs high dose olanzapine.  Supportive therapy dealing with chronic paranoia and isolation from Covid.  Encourage his spirituality as a coping mechanism.  Needs a lot of encouragement.  Repetitively asks for reassurance. Needs reassurance he does not need to go to the hospital.  Weaker and needing more physical assistance.  WC bound    Discussed potential metabolic side  effects associated with atypical antipsychotics, as well as potential risk for movement side effects. Advised pt to contact office if movement side effects occur.   Disc he's taking more than the usuall highest dose of olanzapine but is medically necessary for paranoia. No AIM.  No med changes today. Successfully reduced alprazolam to 0.25 mg HS  25 min appt  FU 4-6 mos  Lynder Parents, MD, DFAPA .    Future Appointments  Date Time Provider [place]  08/21/2020  2:15 PM Gardiner Barefoot, DPM [facility]  01/18/2021 11:30 AM Cottle, Billey Co., MD [facility] None    No orders of the defined types were placed in this encounter.     -------------------------------

## 2020-08-04 DIAGNOSIS — M75102 Unspecified rotator cuff tear or rupture of left shoulder, not specified as traumatic: Secondary | ICD-10-CM | POA: Diagnosis not present

## 2020-08-04 DIAGNOSIS — E119 Type 2 diabetes mellitus without complications: Secondary | ICD-10-CM | POA: Diagnosis not present

## 2020-08-06 DIAGNOSIS — K219 Gastro-esophageal reflux disease without esophagitis: Secondary | ICD-10-CM | POA: Diagnosis not present

## 2020-08-06 DIAGNOSIS — K5909 Other constipation: Secondary | ICD-10-CM | POA: Diagnosis not present

## 2020-08-06 DIAGNOSIS — G4709 Other insomnia: Secondary | ICD-10-CM | POA: Diagnosis not present

## 2020-08-17 DIAGNOSIS — I110 Hypertensive heart disease with heart failure: Secondary | ICD-10-CM | POA: Diagnosis not present

## 2020-08-17 DIAGNOSIS — E1169 Type 2 diabetes mellitus with other specified complication: Secondary | ICD-10-CM | POA: Diagnosis not present

## 2020-08-17 DIAGNOSIS — E785 Hyperlipidemia, unspecified: Secondary | ICD-10-CM | POA: Diagnosis not present

## 2020-08-17 DIAGNOSIS — R2689 Other abnormalities of gait and mobility: Secondary | ICD-10-CM | POA: Diagnosis not present

## 2020-08-17 DIAGNOSIS — M6281 Muscle weakness (generalized): Secondary | ICD-10-CM | POA: Diagnosis not present

## 2020-08-18 DIAGNOSIS — R2689 Other abnormalities of gait and mobility: Secondary | ICD-10-CM | POA: Diagnosis not present

## 2020-08-18 DIAGNOSIS — M6281 Muscle weakness (generalized): Secondary | ICD-10-CM | POA: Diagnosis not present

## 2020-08-18 DIAGNOSIS — E1169 Type 2 diabetes mellitus with other specified complication: Secondary | ICD-10-CM | POA: Diagnosis not present

## 2020-08-18 DIAGNOSIS — E785 Hyperlipidemia, unspecified: Secondary | ICD-10-CM | POA: Diagnosis not present

## 2020-08-18 DIAGNOSIS — I110 Hypertensive heart disease with heart failure: Secondary | ICD-10-CM | POA: Diagnosis not present

## 2020-08-20 DIAGNOSIS — K5909 Other constipation: Secondary | ICD-10-CM | POA: Diagnosis not present

## 2020-08-20 DIAGNOSIS — R2689 Other abnormalities of gait and mobility: Secondary | ICD-10-CM | POA: Diagnosis not present

## 2020-08-20 DIAGNOSIS — E785 Hyperlipidemia, unspecified: Secondary | ICD-10-CM | POA: Diagnosis not present

## 2020-08-20 DIAGNOSIS — E1169 Type 2 diabetes mellitus with other specified complication: Secondary | ICD-10-CM | POA: Diagnosis not present

## 2020-08-20 DIAGNOSIS — M6281 Muscle weakness (generalized): Secondary | ICD-10-CM | POA: Diagnosis not present

## 2020-08-20 DIAGNOSIS — I110 Hypertensive heart disease with heart failure: Secondary | ICD-10-CM | POA: Diagnosis not present

## 2020-08-21 ENCOUNTER — Encounter: Payer: Self-pay | Admitting: Podiatry

## 2020-08-21 ENCOUNTER — Ambulatory Visit (INDEPENDENT_AMBULATORY_CARE_PROVIDER_SITE_OTHER): Payer: Medicare Other | Admitting: Podiatry

## 2020-08-21 ENCOUNTER — Other Ambulatory Visit: Payer: Self-pay

## 2020-08-21 DIAGNOSIS — E1159 Type 2 diabetes mellitus with other circulatory complications: Secondary | ICD-10-CM

## 2020-08-21 DIAGNOSIS — M6281 Muscle weakness (generalized): Secondary | ICD-10-CM | POA: Diagnosis not present

## 2020-08-21 DIAGNOSIS — E1169 Type 2 diabetes mellitus with other specified complication: Secondary | ICD-10-CM | POA: Diagnosis not present

## 2020-08-21 DIAGNOSIS — M2041 Other hammer toe(s) (acquired), right foot: Secondary | ICD-10-CM

## 2020-08-21 DIAGNOSIS — R2689 Other abnormalities of gait and mobility: Secondary | ICD-10-CM | POA: Diagnosis not present

## 2020-08-21 DIAGNOSIS — M79609 Pain in unspecified limb: Secondary | ICD-10-CM

## 2020-08-21 DIAGNOSIS — B351 Tinea unguium: Secondary | ICD-10-CM | POA: Diagnosis not present

## 2020-08-21 DIAGNOSIS — E785 Hyperlipidemia, unspecified: Secondary | ICD-10-CM | POA: Diagnosis not present

## 2020-08-21 DIAGNOSIS — I110 Hypertensive heart disease with heart failure: Secondary | ICD-10-CM | POA: Diagnosis not present

## 2020-08-21 DIAGNOSIS — M2042 Other hammer toe(s) (acquired), left foot: Secondary | ICD-10-CM

## 2020-08-21 NOTE — Progress Notes (Signed)
This patient returns to my office for at risk foot care.  This patient requires this care by a professional since this patient will be at risk due to having CKD and diabetes.  This patient is unable to cut nails himself since the patient cannot reach his nails.These nails are painful walking and wearing shoes. This patient presents to the office in a wheelchair.  This patient presents for at risk foot care today.  General Appearance  Alert, conversant and in no acute stress.  Vascular  Dorsalis pedis and posterior tibial  pulses are weakly  palpable  bilaterally.  Capillary return is within normal limits  bilaterally. Cold feet  Bilaterally. Absent digital hair B/L.  Neurologic  Senn-Weinstein monofilament wire test diminished   bilaterally. Muscle power within normal limits bilaterally.  Nails Thick disfigured discolored nails with subungual debris  from hallux to fifth toes bilaterally. No evidence of bacterial infection or drainage bilaterally.  Orthopedic  No limitations of motion  feet .  No crepitus or effusions noted.  No bony pathology or digital deformities noted.  Skin  normotropic skin with no porokeratosis noted bilaterally.  No signs of infections or ulcers noted.     Onychomycosis  Pain in right toes  Pain in left toes  Consent was obtained for treatment procedures.   Mechanical debridement of nails 1-5  bilaterally performed with a nail nipper.  Fourth toenail left foot was growing into skin.  Cauterized his fourth toe left foot  His left foot sat completely on its side.Marland Kitchen  RTC 3 months.   Return office visit   3 months                  Told patient to return for periodic foot care and evaluation due to potential at risk complications.   Gardiner Barefoot DPM

## 2020-08-23 DIAGNOSIS — E1169 Type 2 diabetes mellitus with other specified complication: Secondary | ICD-10-CM | POA: Diagnosis not present

## 2020-08-23 DIAGNOSIS — M6281 Muscle weakness (generalized): Secondary | ICD-10-CM | POA: Diagnosis not present

## 2020-08-23 DIAGNOSIS — I110 Hypertensive heart disease with heart failure: Secondary | ICD-10-CM | POA: Diagnosis not present

## 2020-08-23 DIAGNOSIS — E785 Hyperlipidemia, unspecified: Secondary | ICD-10-CM | POA: Diagnosis not present

## 2020-08-23 DIAGNOSIS — R2689 Other abnormalities of gait and mobility: Secondary | ICD-10-CM | POA: Diagnosis not present

## 2020-08-24 DIAGNOSIS — R2689 Other abnormalities of gait and mobility: Secondary | ICD-10-CM | POA: Diagnosis not present

## 2020-08-24 DIAGNOSIS — I110 Hypertensive heart disease with heart failure: Secondary | ICD-10-CM | POA: Diagnosis not present

## 2020-08-24 DIAGNOSIS — E785 Hyperlipidemia, unspecified: Secondary | ICD-10-CM | POA: Diagnosis not present

## 2020-08-24 DIAGNOSIS — E1169 Type 2 diabetes mellitus with other specified complication: Secondary | ICD-10-CM | POA: Diagnosis not present

## 2020-08-24 DIAGNOSIS — M6281 Muscle weakness (generalized): Secondary | ICD-10-CM | POA: Diagnosis not present

## 2020-08-25 DIAGNOSIS — M6281 Muscle weakness (generalized): Secondary | ICD-10-CM | POA: Diagnosis not present

## 2020-08-25 DIAGNOSIS — E785 Hyperlipidemia, unspecified: Secondary | ICD-10-CM | POA: Diagnosis not present

## 2020-08-25 DIAGNOSIS — I110 Hypertensive heart disease with heart failure: Secondary | ICD-10-CM | POA: Diagnosis not present

## 2020-08-25 DIAGNOSIS — E1169 Type 2 diabetes mellitus with other specified complication: Secondary | ICD-10-CM | POA: Diagnosis not present

## 2020-08-25 DIAGNOSIS — R2689 Other abnormalities of gait and mobility: Secondary | ICD-10-CM | POA: Diagnosis not present

## 2020-08-28 DIAGNOSIS — I110 Hypertensive heart disease with heart failure: Secondary | ICD-10-CM | POA: Diagnosis not present

## 2020-08-28 DIAGNOSIS — R2689 Other abnormalities of gait and mobility: Secondary | ICD-10-CM | POA: Diagnosis not present

## 2020-08-28 DIAGNOSIS — E785 Hyperlipidemia, unspecified: Secondary | ICD-10-CM | POA: Diagnosis not present

## 2020-08-28 DIAGNOSIS — M6281 Muscle weakness (generalized): Secondary | ICD-10-CM | POA: Diagnosis not present

## 2020-08-28 DIAGNOSIS — E1169 Type 2 diabetes mellitus with other specified complication: Secondary | ICD-10-CM | POA: Diagnosis not present

## 2020-08-29 DIAGNOSIS — R2689 Other abnormalities of gait and mobility: Secondary | ICD-10-CM | POA: Diagnosis not present

## 2020-08-29 DIAGNOSIS — E1169 Type 2 diabetes mellitus with other specified complication: Secondary | ICD-10-CM | POA: Diagnosis not present

## 2020-08-29 DIAGNOSIS — M6281 Muscle weakness (generalized): Secondary | ICD-10-CM | POA: Diagnosis not present

## 2020-08-29 DIAGNOSIS — E785 Hyperlipidemia, unspecified: Secondary | ICD-10-CM | POA: Diagnosis not present

## 2020-08-29 DIAGNOSIS — I110 Hypertensive heart disease with heart failure: Secondary | ICD-10-CM | POA: Diagnosis not present

## 2020-08-30 DIAGNOSIS — E1169 Type 2 diabetes mellitus with other specified complication: Secondary | ICD-10-CM | POA: Diagnosis not present

## 2020-08-30 DIAGNOSIS — I110 Hypertensive heart disease with heart failure: Secondary | ICD-10-CM | POA: Diagnosis not present

## 2020-08-30 DIAGNOSIS — M6281 Muscle weakness (generalized): Secondary | ICD-10-CM | POA: Diagnosis not present

## 2020-08-30 DIAGNOSIS — R2689 Other abnormalities of gait and mobility: Secondary | ICD-10-CM | POA: Diagnosis not present

## 2020-08-30 DIAGNOSIS — E785 Hyperlipidemia, unspecified: Secondary | ICD-10-CM | POA: Diagnosis not present

## 2020-08-31 DIAGNOSIS — R2689 Other abnormalities of gait and mobility: Secondary | ICD-10-CM | POA: Diagnosis not present

## 2020-08-31 DIAGNOSIS — E785 Hyperlipidemia, unspecified: Secondary | ICD-10-CM | POA: Diagnosis not present

## 2020-08-31 DIAGNOSIS — E1169 Type 2 diabetes mellitus with other specified complication: Secondary | ICD-10-CM | POA: Diagnosis not present

## 2020-08-31 DIAGNOSIS — I110 Hypertensive heart disease with heart failure: Secondary | ICD-10-CM | POA: Diagnosis not present

## 2020-08-31 DIAGNOSIS — M6281 Muscle weakness (generalized): Secondary | ICD-10-CM | POA: Diagnosis not present

## 2020-09-01 DIAGNOSIS — E785 Hyperlipidemia, unspecified: Secondary | ICD-10-CM | POA: Diagnosis not present

## 2020-09-01 DIAGNOSIS — M6281 Muscle weakness (generalized): Secondary | ICD-10-CM | POA: Diagnosis not present

## 2020-09-01 DIAGNOSIS — I110 Hypertensive heart disease with heart failure: Secondary | ICD-10-CM | POA: Diagnosis not present

## 2020-09-01 DIAGNOSIS — R2689 Other abnormalities of gait and mobility: Secondary | ICD-10-CM | POA: Diagnosis not present

## 2020-09-01 DIAGNOSIS — E1169 Type 2 diabetes mellitus with other specified complication: Secondary | ICD-10-CM | POA: Diagnosis not present

## 2020-09-03 DIAGNOSIS — M6281 Muscle weakness (generalized): Secondary | ICD-10-CM | POA: Diagnosis not present

## 2020-09-03 DIAGNOSIS — R2689 Other abnormalities of gait and mobility: Secondary | ICD-10-CM | POA: Diagnosis not present

## 2020-09-03 DIAGNOSIS — E785 Hyperlipidemia, unspecified: Secondary | ICD-10-CM | POA: Diagnosis not present

## 2020-09-03 DIAGNOSIS — I110 Hypertensive heart disease with heart failure: Secondary | ICD-10-CM | POA: Diagnosis not present

## 2020-09-03 DIAGNOSIS — E1169 Type 2 diabetes mellitus with other specified complication: Secondary | ICD-10-CM | POA: Diagnosis not present

## 2020-09-04 DIAGNOSIS — E1169 Type 2 diabetes mellitus with other specified complication: Secondary | ICD-10-CM | POA: Diagnosis not present

## 2020-09-04 DIAGNOSIS — M6281 Muscle weakness (generalized): Secondary | ICD-10-CM | POA: Diagnosis not present

## 2020-09-04 DIAGNOSIS — I110 Hypertensive heart disease with heart failure: Secondary | ICD-10-CM | POA: Diagnosis not present

## 2020-09-04 DIAGNOSIS — E785 Hyperlipidemia, unspecified: Secondary | ICD-10-CM | POA: Diagnosis not present

## 2020-09-04 DIAGNOSIS — R2689 Other abnormalities of gait and mobility: Secondary | ICD-10-CM | POA: Diagnosis not present

## 2020-09-05 DIAGNOSIS — M6281 Muscle weakness (generalized): Secondary | ICD-10-CM | POA: Diagnosis not present

## 2020-09-05 DIAGNOSIS — I110 Hypertensive heart disease with heart failure: Secondary | ICD-10-CM | POA: Diagnosis not present

## 2020-09-05 DIAGNOSIS — E1169 Type 2 diabetes mellitus with other specified complication: Secondary | ICD-10-CM | POA: Diagnosis not present

## 2020-09-05 DIAGNOSIS — E785 Hyperlipidemia, unspecified: Secondary | ICD-10-CM | POA: Diagnosis not present

## 2020-09-05 DIAGNOSIS — R2689 Other abnormalities of gait and mobility: Secondary | ICD-10-CM | POA: Diagnosis not present

## 2020-09-08 DIAGNOSIS — E1169 Type 2 diabetes mellitus with other specified complication: Secondary | ICD-10-CM | POA: Diagnosis not present

## 2020-09-08 DIAGNOSIS — M6281 Muscle weakness (generalized): Secondary | ICD-10-CM | POA: Diagnosis not present

## 2020-09-08 DIAGNOSIS — E785 Hyperlipidemia, unspecified: Secondary | ICD-10-CM | POA: Diagnosis not present

## 2020-09-08 DIAGNOSIS — R2689 Other abnormalities of gait and mobility: Secondary | ICD-10-CM | POA: Diagnosis not present

## 2020-09-08 DIAGNOSIS — I110 Hypertensive heart disease with heart failure: Secondary | ICD-10-CM | POA: Diagnosis not present

## 2020-09-09 DIAGNOSIS — E119 Type 2 diabetes mellitus without complications: Secondary | ICD-10-CM | POA: Diagnosis not present

## 2020-09-10 ENCOUNTER — Other Ambulatory Visit: Payer: Self-pay

## 2020-09-10 ENCOUNTER — Emergency Department (HOSPITAL_COMMUNITY): Payer: Medicare Other

## 2020-09-10 ENCOUNTER — Emergency Department (HOSPITAL_COMMUNITY)
Admission: EM | Admit: 2020-09-10 | Discharge: 2020-09-10 | Disposition: A | Discharge status: Home / Self Care | Payer: Medicare Other | Attending: Emergency Medicine | Admitting: Emergency Medicine

## 2020-09-10 ENCOUNTER — Encounter (HOSPITAL_COMMUNITY): Payer: Self-pay | Admitting: Emergency Medicine

## 2020-09-10 DIAGNOSIS — I129 Hypertensive chronic kidney disease with stage 1 through stage 4 chronic kidney disease, or unspecified chronic kidney disease: Secondary | ICD-10-CM | POA: Insufficient documentation

## 2020-09-10 DIAGNOSIS — S72435A Nondisplaced fracture of medial condyle of left femur, initial encounter for closed fracture: Secondary | ICD-10-CM

## 2020-09-10 DIAGNOSIS — Z7401 Bed confinement status: Secondary | ICD-10-CM | POA: Diagnosis not present

## 2020-09-10 DIAGNOSIS — E1122 Type 2 diabetes mellitus with diabetic chronic kidney disease: Secondary | ICD-10-CM | POA: Insufficient documentation

## 2020-09-10 DIAGNOSIS — R531 Weakness: Secondary | ICD-10-CM | POA: Diagnosis not present

## 2020-09-10 DIAGNOSIS — M7989 Other specified soft tissue disorders: Secondary | ICD-10-CM | POA: Diagnosis not present

## 2020-09-10 DIAGNOSIS — Z794 Long term (current) use of insulin: Secondary | ICD-10-CM | POA: Diagnosis not present

## 2020-09-10 DIAGNOSIS — Z79899 Other long term (current) drug therapy: Secondary | ICD-10-CM | POA: Insufficient documentation

## 2020-09-10 DIAGNOSIS — F0390 Unspecified dementia without behavioral disturbance: Secondary | ICD-10-CM | POA: Insufficient documentation

## 2020-09-10 DIAGNOSIS — W010XXA Fall on same level from slipping, tripping and stumbling without subsequent striking against object, initial encounter: Secondary | ICD-10-CM

## 2020-09-10 DIAGNOSIS — Z743 Need for continuous supervision: Secondary | ICD-10-CM | POA: Diagnosis not present

## 2020-09-10 DIAGNOSIS — E1144 Type 2 diabetes mellitus with diabetic amyotrophy: Secondary | ICD-10-CM | POA: Insufficient documentation

## 2020-09-10 DIAGNOSIS — Z7984 Long term (current) use of oral hypoglycemic drugs: Secondary | ICD-10-CM | POA: Insufficient documentation

## 2020-09-10 DIAGNOSIS — S72434A Nondisplaced fracture of medial condyle of right femur, initial encounter for closed fracture: Secondary | ICD-10-CM | POA: Insufficient documentation

## 2020-09-10 DIAGNOSIS — S8992XA Unspecified injury of left lower leg, initial encounter: Secondary | ICD-10-CM | POA: Diagnosis present

## 2020-09-10 DIAGNOSIS — W19XXXA Unspecified fall, initial encounter: Secondary | ICD-10-CM | POA: Diagnosis not present

## 2020-09-10 DIAGNOSIS — S8002XA Contusion of left knee, initial encounter: Secondary | ICD-10-CM | POA: Diagnosis not present

## 2020-09-10 DIAGNOSIS — N182 Chronic kidney disease, stage 2 (mild): Secondary | ICD-10-CM | POA: Diagnosis not present

## 2020-09-10 DIAGNOSIS — R739 Hyperglycemia, unspecified: Secondary | ICD-10-CM | POA: Diagnosis not present

## 2020-09-10 DIAGNOSIS — I10 Essential (primary) hypertension: Secondary | ICD-10-CM | POA: Diagnosis not present

## 2020-09-10 DIAGNOSIS — M25462 Effusion, left knee: Secondary | ICD-10-CM | POA: Diagnosis not present

## 2020-09-10 DIAGNOSIS — S80919A Unspecified superficial injury of unspecified knee, initial encounter: Secondary | ICD-10-CM | POA: Diagnosis not present

## 2020-09-10 DIAGNOSIS — M1712 Unilateral primary osteoarthritis, left knee: Secondary | ICD-10-CM | POA: Diagnosis not present

## 2020-09-10 DIAGNOSIS — R609 Edema, unspecified: Secondary | ICD-10-CM | POA: Diagnosis not present

## 2020-09-10 MED ORDER — TRAMADOL HCL 50 MG PO TABS
50.0000 mg | ORAL_TABLET | Freq: Once | ORAL | Status: AC
  Administered 2020-09-10: 50 mg via ORAL
  Filled 2020-09-10: qty 1

## 2020-09-10 MED ORDER — TRAMADOL HCL 50 MG PO TABS: 50.0000 mg | ORAL_TABLET | Freq: Four times a day (QID) | ORAL | 0 refills | Status: DC | PRN

## 2020-09-10 NOTE — Discharge Instructions (Addendum)
It was our pleasure to provide your ER care today - we hope that you feel better.  Your xrays show a fracture of the medial condyle of distal left femur. Wear knee immobilizer, elevate leg. Ice/coldpack to area. No weight bearing until cleared to do so by orthopedist. Fall precautions.   Take acetaminophen as need for pain. You may also take ultram as need for pain.   Follow up with orthopedist in 1 week - call office Monday AM to arrange appointment.  Return to ER if worse, new symptoms, intractable pain, numbness/weakness, or other concern.

## 2020-09-10 NOTE — ED Triage Notes (Signed)
BIBA Per EMS: PT coming from [facility] with c/o a fall that occurred this morning. Pt fell on face and loosened some teeth, but denies headache, neck pain, back pain, etc. Denies blood thinners. Pt only c/o left knee pain. Vitals WDL:  160/86 88HR  16 RR 97% RA 426 CBG  Hx DBM

## 2020-09-10 NOTE — ED Provider Notes (Signed)
[facility] DEPT Provider Note   CSN: [id number] Arrival date & time: 09/10/20  1359     History Chief Complaint  Patient presents with  . Fall  . Knee Injury    Ricky [name]. is a 80 y.o. male.  Patient presents via EMS from [facility] s/p fall w c/o left knee contusion/pain. Symptoms acute onset, moderate, constant, persistent, dull, non radiating, worse w movement. Skin is intact. Denies head injury or headache. No loc. No neck or back pain. No chest pain or sob. No abd pain or nv. Denies other extremity pain or injury. No faintness or dizziness prior to fall. Indicates not ambulatory at baseline. No anticoag use.   The history is provided by the patient, medical records and the EMS personnel.      Past Medical History:  Diagnosis Date  . Abnormality of gait   . Adenomatous colon polyp   . Arthritis   . Cataract   . Dementia ([place])   . Depression   . Diabetes mellitus without complication ([place])   . Fatty liver   . Hyperlipidemia   . Hypertension   . Internal hemorrhoids   . Other malaise and fatigue   . Peripheral edema   . Schizophrenia ([place])   . Type II or unspecified type diabetes mellitus without mention of complication, not stated as uncontrolled   . Urinary frequency   . Urinary retention     Patient Active Problem List   Diagnosis Date Noted  . Dependence on wheelchair 05/17/2020  . 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 Diaphragmatic hernia 05/17/2020  . Other specified postprocedural states 05/17/2020  . Rupture of right rotator cuff 05/17/2020  . Urge incontinence of urine 05/17/2020  . Stage III pressure ulcer of left buttock ([place]) 01/19/2018  . Left leg cellulitis 01/18/2018  . MRSA bacteremia 01/17/2018  . MCI (mild cognitive impairment) 10/24/2017  . OCD (obsessive compulsive disorder) 10/24/2017  . Acute respiratory failure ([place]) 05/06/2017  . HCAP (healthcare-associated  pneumonia) 05/06/2017  . Sepsis ([place]) 05/06/2017  . Seizure ([place]) 05/06/2017  . CKD (chronic kidney disease), stage II 05/06/2017  . Food impaction of esophagus   . Esophageal stricture   . Hiatal hernia   . Urinary retention 12/10/2012  . Rhabdomyolysis 12/10/2012  . Falls 12/10/2012  . First degree AV block 10/20/2012  . Extremity muscle atrophy 10/20/2012  . Lumbosacral plexopathy 07/20/2012  . Diabetes mellitus without complication ([place])   . Hypertension   . Depression   . Arthritis   . Hyperlipidemia   . Cataract   . Urinary frequency   . Fatty liver   . Schizophrenia ([place])   . Peripheral edema   . Internal hemorrhoids   . Adenomatous colon polyp   . Type II or unspecified type diabetes mellitus without mention of complication, not stated as uncontrolled   . Type II or unspecified type diabetes mellitus with neurological manifestations, not stated as uncontrolled(250.60) 03/20/2012  . Abnormality of gait 03/20/2012  . Other malaise and fatigue 03/20/2012    Past Surgical History:  Procedure Laterality Date  . BALLOON DILATION N/A 05/22/2012   Procedure: BALLOON DILATION;  Surgeon: Beryle Beams, MD;  Location: [facility] ENDOSCOPY;  Service: Endoscopy;  Laterality: N/A;  . BALLOON DILATION N/A 06/09/2015   Procedure: BALLOON DILATION;  Surgeon: Carol Ada, MD;  Location: [facility] ENDOSCOPY;  Service: Endoscopy;  Laterality: N/A;  . ESOPHAGOGASTRODUODENOSCOPY  02/21/2012   Procedure: ESOPHAGOGASTRODUODENOSCOPY (EGD);  Surgeon: Beryle Beams, MD;  Location: [facility];  Service: Endoscopy;  Laterality: N/A;  . ESOPHAGOGASTRODUODENOSCOPY N/A 05/22/2012   Procedure: ESOPHAGOGASTRODUODENOSCOPY (EGD);  Surgeon: Beryle Beams, MD;  Location: [facility];  Service: Endoscopy;  Laterality: N/A;  . ESOPHAGOGASTRODUODENOSCOPY N/A 06/17/2014   Procedure: ESOPHAGOGASTRODUODENOSCOPY (EGD);  Surgeon: Carol Ada, MD;  Location: [facility];  Service: Endoscopy;  Laterality: N/A;  .  ESOPHAGOGASTRODUODENOSCOPY N/A 08/10/2014   Procedure: ESOPHAGOGASTRODUODENOSCOPY (EGD);  Surgeon: Carol Ada, MD;  Location: [facility];  Service: Endoscopy;  Laterality: N/A;  . ESOPHAGOGASTRODUODENOSCOPY N/A 05/28/2015   Procedure: ESOPHAGOGASTRODUODENOSCOPY (EGD);  Surgeon: Gatha Mayer, MD;  Location: [facility];  Service: Endoscopy;  Laterality: N/A;  . ESOPHAGOGASTRODUODENOSCOPY (EGD) WITH PROPOFOL N/A 06/09/2015   Procedure: ESOPHAGOGASTRODUODENOSCOPY (EGD) WITH PROPOFOL;  Surgeon: Carol Ada, MD;  Location: [facility] ENDOSCOPY;  Service: Endoscopy;  Laterality: N/A;  . SAVORY DILATION N/A 06/17/2014   Procedure: SAVORY DILATION;  Surgeon: Carol Ada, MD;  Location: [facility] ENDOSCOPY;  Service: Endoscopy;  Laterality: N/A;  . TEE WITHOUT CARDIOVERSION N/A 01/20/2018   Procedure: TRANSESOPHAGEAL ECHOCARDIOGRAM (TEE);  Surgeon: Jerline Pain, MD;  Location: [facility] ENDOSCOPY;  Service: Cardiovascular;  Laterality: N/A;  . TONSILLECTOMY         Family History  Problem Relation Age of Onset  . Brain cancer Father     Social History   Tobacco Use  . Smoking status: Never  . Smokeless tobacco: Never  Substance Use Topics  . Alcohol use: No  . Drug use: No    Home Medications Prior to Admission medications   Medication Sig Start Date End Date Taking? Authorizing Provider  ALPRAZolam Duanne Moron) 0.5 MG tablet Take 1 tablet (0.5 mg total) by mouth at bedtime. Patient taking differently: Take 0.25 mg by mouth at bedtime. 01/22/18   Nita Sells, MD  amLODipine (NORVASC) 2.5 MG tablet  01/18/20   [provider]  amLODipine (NORVASC) 5 MG tablet Take 5 mg by mouth daily. 09/04/12   [provider]  Calcium Citrate-Vitamin D (CITRACAL + D PO) Take 1 tablet by mouth daily.    [provider]  Cholecalciferol (VITAMIN D-3) 1000 units CAPS Take 1 capsule by mouth daily.    [provider]  docusate sodium (COLACE) 100 MG capsule Take 100 mg by mouth 2 (two)  times daily.    [provider]  empagliflozin (JARDIANCE) 10 MG TABS tablet Take 10 mg by mouth daily. 01/22/18   Nita Sells, MD  fluvoxaMINE (LUVOX) 100 MG tablet Take 1 tablet (100 mg total) by mouth at bedtime. 01/22/18   Nita Sells, MD  furosemide (LASIX) 20 MG tablet Take 20 mg by mouth daily before breakfast.  12/07/11   [provider]  HUMALOG KWIKPEN 100 UNIT/ML KiwkPen Inject 2-11 Units into the skin See admin instructions. Times: 0630 1130 1630  Scale: 101-150: 2 units 151-200: 3 units 201-250: 5 units 251-300: 7 units 301-350: 9 units >350: 11 units 09/30/17   [provider]  hydrOXYzine (VISTARIL) 25 MG capsule     [provider]  insulin detemir (LEVEMIR) 100 UNIT/ML injection Inject 0.3 mLs (30 Units total) into the skin at bedtime. Patient taking differently: Inject 24 Units into the skin at bedtime. 01/22/18   Nita Sells, MD  insulin glargine (LANTUS) 100 UNIT/ML injection 25 u    [provider]  insulin regular (NOVOLIN R) 100 units/mL injection ss    [provider]  meloxicam (MOBIC) 15 MG tablet 1 tablet  [provider]  Menthol, Topical Analgesic, (BIOFREEZE) 4 % GEL Apply topically.    [provider]  metFORMIN (GLUCOPHAGE) 500 MG tablet 1 tablet with meals    [provider]  Multiple Vitamins-Minerals (THERAGRAN-M PREMIER 50 PLUS PO) Take 1 tablet by mouth daily.    [provider]  MYRBETRIQ 25 MG TB24 tablet Take 25 mg by mouth daily.  10/05/16   [provider]  OLANZapine (ZYPREXA) 5 MG tablet Take 5 tablets (25 mg total) by mouth daily. 01/22/18   Nita Sells, MD  Omega-3 Fatty Acids (FISH OIL) 1200 MG CAPS     [provider]  omeprazole (PRILOSEC) 40 MG capsule Take 40 mg by mouth daily.  09/28/12   [provider]  polyethylene glycol powder (GLYCOLAX/MIRALAX) 17 GM/SCOOP powder Take 1 Container by mouth  once.    [provider]  pravastatin (PRAVACHOL) 40 MG tablet Take 40 mg by mouth daily after lunch.     [provider]  tamsulosin (FLOMAX) 0.4 MG CAPS capsule Take 0.4 mg by mouth daily.  10/01/16   [provider]  zolpidem (AMBIEN) 5 MG tablet Take 1 tablet (5 mg total) by mouth at bedtime. 01/22/18   Nita Sells, MD    Allergies    Asa [aspirin], Codeine, Tylenol with codeine #3 [acetaminophen-codeine], and Tylenol [acetaminophen]  Review of Systems   Review of Systems  Constitutional:  Negative for fever.  HENT:  Negative for nosebleeds.   Eyes:  Negative for pain and visual disturbance.  Respiratory:  Negative for shortness of breath.   Cardiovascular:  Negative for chest pain.  Gastrointestinal:  Negative for abdominal pain and vomiting.  Genitourinary:  Negative for flank pain.  Musculoskeletal:  Negative for back pain and neck pain.  Skin:  Negative for rash.  Neurological:  Negative for weakness, numbness and headaches.  Hematological:  Does not bruise/bleed easily.  Psychiatric/Behavioral:  Negative for confusion.    Physical Exam Updated Vital Signs BP (!) 142/92   Pulse (!) 118   Temp 98.4 F (36.9 C) (Oral)   Resp 15   SpO2 96%   Physical Exam Vitals and nursing note reviewed.  Constitutional:      Appearance: Normal appearance. He is well-developed.  HENT:     Head: Atraumatic.     Nose: Nose normal.     Mouth/Throat:     Mouth: Mucous membranes are moist.     Pharynx: Oropharynx is clear.  Eyes:     General: No scleral icterus.    Conjunctiva/sclera: Conjunctivae normal.     Pupils: Pupils are equal, round, and reactive to light.  Neck:     Trachea: No tracheal deviation.  Cardiovascular:     Rate and Rhythm: Normal rate and regular rhythm.     Pulses: Normal pulses.     Heart sounds: Normal heart sounds. No murmur heard.   No friction rub. No gallop.  Pulmonary:     Effort: Pulmonary effort is normal. No  accessory muscle usage or respiratory distress.     Breath sounds: Normal breath sounds.  Chest:     Chest wall: No tenderness.  Abdominal:     General: Bowel sounds are normal. There is no distension.     Palpations: Abdomen is soft.     Tenderness: There is no abdominal tenderness. There is no guarding.     Comments: No abd bruising or contusion.   Genitourinary:    Comments: No cva tenderness. Musculoskeletal:  General: No swelling.     Cervical back: Normal range of motion and neck supple. No rigidity or tenderness.     Comments: CTLS spine, non tender, aligned, no step off. Sts and tenderness left knee, limited rom due to pain and swelling. Distal pulses palp. No other focal bony tenderness on bilateral extremity exam.    Skin:    General: Skin is warm and dry.     Findings: No rash.  Neurological:     Mental Status: He is alert.     Comments: Alert, speech clear. GCS 15. Motor/sens grossly intact bil.   Psychiatric:        Mood and Affect: Mood normal.    ED Results / Procedures / Treatments   Labs (all labs ordered are listed, but only abnormal results are displayed) Labs Reviewed - No data to display  EKG None  Radiology CT Knee Left Wo Contrast  Result Date: 09/10/2020 CLINICAL DATA:  Knee trauma, tenderness or effusion, no prior imaging (Age >= 1y) Knee trauma, tenderness or effusion, no prior imaging (Age >= 1y) EXAM: CT OF THE left KNEE WITHOUT CONTRAST TECHNIQUE: Multidetector CT imaging of the left knee was performed according to the standard protocol. Multiplanar CT image reconstructions were also generated. COMPARISON:  None. FINDINGS: Bones/Joint/Cartilage There is a large lipohemarthrosis with evidence of a nondisplaced intra-articular fracture involving the medial femoral condyle. There is tricompartment osteoarthritis and calcinosis of the mediolateral menisci. Diffuse osteopenia. There is no visible proximal tibial or proximal fibular fracture. The knee  is held in flexion. Ligaments Suboptimally assessed by CT. Muscles and Tendons Mild generalized muscle atrophy. Soft tissues Soft tissue swelling along the knee. IMPRESSION: Nondisplaced intra-articular fracture involving the medial femoral condyle. Large lipohemarthrosis. Electronically Signed   By: Maurine Simmering M.D.   On: 09/10/2020 15:55   DG Knee Complete 4 Views Left  Result Date: 09/10/2020 CLINICAL DATA:  Fall, pain EXAM: LEFT KNEE - COMPLETE 4+ VIEW COMPARISON:  None. FINDINGS: Advanced tricompartment degenerative changes with joint space narrowing and spurring. Large joint effusion. No acute bony abnormality. Specifically, no fracture, subluxation, or dislocation. Vascular calcifications. IMPRESSION: No visible acute bony abnormality. Advanced degenerative changes and large joint effusion. Electronically Signed   By: Rolm Baptise M.D.   On: 09/10/2020 15:03    Procedures Procedures   Medications Ordered in ED Medications - No data to display  ED Course  I have reviewed the triage vital signs and the nursing notes.  Pertinent labs & imaging results that were available during my care of the patient were reviewed by me and considered in my medical decision making (see chart for details).    MDM Rules/Calculators/A&P                          Xrays.  Reviewed nursing notes and prior charts for additional history.   Xrays reviewed/interpreted by me - no obvious fx. +eff/hematoma.   Will get ct r/o occult fx.  CT reviewed/interpreted by me - left medial fem condyle fx w hemarthrosis.   Ultram po.   Recheck, compartments of leg soft, not tense. Pain controlled and not out of proportion to injury. Distal pulses palp. Normal warmth and color of distal leg and foot.   Knee immobilizer/pad well.   Icepack.   Pt currently appears stable for d/c.     Final Clinical Impression(s) / ED Diagnoses Final diagnoses:  None    Rx / DC Orders ED Discharge  Orders     None         Lajean Saver, MD 09/10/20 1616

## 2020-09-10 NOTE — ED Notes (Signed)
[facility] here to take pt back to [facility]

## 2020-09-11 DIAGNOSIS — E1169 Type 2 diabetes mellitus with other specified complication: Secondary | ICD-10-CM | POA: Diagnosis not present

## 2020-09-11 DIAGNOSIS — I110 Hypertensive heart disease with heart failure: Secondary | ICD-10-CM | POA: Diagnosis not present

## 2020-09-11 DIAGNOSIS — E785 Hyperlipidemia, unspecified: Secondary | ICD-10-CM | POA: Diagnosis not present

## 2020-09-11 DIAGNOSIS — R2689 Other abnormalities of gait and mobility: Secondary | ICD-10-CM | POA: Diagnosis not present

## 2020-09-11 DIAGNOSIS — M6281 Muscle weakness (generalized): Secondary | ICD-10-CM | POA: Diagnosis not present

## 2020-09-12 DIAGNOSIS — E1169 Type 2 diabetes mellitus with other specified complication: Secondary | ICD-10-CM | POA: Diagnosis not present

## 2020-09-12 DIAGNOSIS — I110 Hypertensive heart disease with heart failure: Secondary | ICD-10-CM | POA: Diagnosis not present

## 2020-09-12 DIAGNOSIS — R2689 Other abnormalities of gait and mobility: Secondary | ICD-10-CM | POA: Diagnosis not present

## 2020-09-12 DIAGNOSIS — S7292XD Unspecified fracture of left femur, subsequent encounter for closed fracture with routine healing: Secondary | ICD-10-CM | POA: Diagnosis not present

## 2020-09-12 DIAGNOSIS — W19XXXD Unspecified fall, subsequent encounter: Secondary | ICD-10-CM | POA: Diagnosis not present

## 2020-09-12 DIAGNOSIS — M6281 Muscle weakness (generalized): Secondary | ICD-10-CM | POA: Diagnosis not present

## 2020-09-12 DIAGNOSIS — S8002XD Contusion of left knee, subsequent encounter: Secondary | ICD-10-CM | POA: Diagnosis not present

## 2020-09-12 DIAGNOSIS — E785 Hyperlipidemia, unspecified: Secondary | ICD-10-CM | POA: Diagnosis not present

## 2020-09-12 DIAGNOSIS — R600 Localized edema: Secondary | ICD-10-CM | POA: Diagnosis not present

## 2020-09-13 DIAGNOSIS — E1169 Type 2 diabetes mellitus with other specified complication: Secondary | ICD-10-CM | POA: Diagnosis not present

## 2020-09-13 DIAGNOSIS — E1122 Type 2 diabetes mellitus with diabetic chronic kidney disease: Secondary | ICD-10-CM | POA: Diagnosis not present

## 2020-09-13 DIAGNOSIS — R2689 Other abnormalities of gait and mobility: Secondary | ICD-10-CM | POA: Diagnosis not present

## 2020-09-13 DIAGNOSIS — E785 Hyperlipidemia, unspecified: Secondary | ICD-10-CM | POA: Diagnosis not present

## 2020-09-13 DIAGNOSIS — M6281 Muscle weakness (generalized): Secondary | ICD-10-CM | POA: Diagnosis not present

## 2020-09-13 DIAGNOSIS — I110 Hypertensive heart disease with heart failure: Secondary | ICD-10-CM | POA: Diagnosis not present

## 2020-09-14 DIAGNOSIS — E1169 Type 2 diabetes mellitus with other specified complication: Secondary | ICD-10-CM | POA: Diagnosis not present

## 2020-09-14 DIAGNOSIS — E785 Hyperlipidemia, unspecified: Secondary | ICD-10-CM | POA: Diagnosis not present

## 2020-09-14 DIAGNOSIS — R2689 Other abnormalities of gait and mobility: Secondary | ICD-10-CM | POA: Diagnosis not present

## 2020-09-14 DIAGNOSIS — I110 Hypertensive heart disease with heart failure: Secondary | ICD-10-CM | POA: Diagnosis not present

## 2020-09-14 DIAGNOSIS — M6281 Muscle weakness (generalized): Secondary | ICD-10-CM | POA: Diagnosis not present

## 2020-09-15 DIAGNOSIS — D649 Anemia, unspecified: Secondary | ICD-10-CM | POA: Diagnosis not present

## 2020-09-19 DIAGNOSIS — E119 Type 2 diabetes mellitus without complications: Secondary | ICD-10-CM | POA: Diagnosis not present

## 2020-09-19 DIAGNOSIS — M25562 Pain in left knee: Secondary | ICD-10-CM | POA: Diagnosis not present

## 2020-09-19 DIAGNOSIS — S8002XD Contusion of left knee, subsequent encounter: Secondary | ICD-10-CM | POA: Diagnosis not present

## 2020-09-19 DIAGNOSIS — S7292XD Unspecified fracture of left femur, subsequent encounter for closed fracture with routine healing: Secondary | ICD-10-CM | POA: Diagnosis not present

## 2020-09-22 DIAGNOSIS — E785 Hyperlipidemia, unspecified: Secondary | ICD-10-CM | POA: Diagnosis not present

## 2020-09-22 DIAGNOSIS — M6281 Muscle weakness (generalized): Secondary | ICD-10-CM | POA: Diagnosis not present

## 2020-09-22 DIAGNOSIS — E1169 Type 2 diabetes mellitus with other specified complication: Secondary | ICD-10-CM | POA: Diagnosis not present

## 2020-09-22 DIAGNOSIS — R2689 Other abnormalities of gait and mobility: Secondary | ICD-10-CM | POA: Diagnosis not present

## 2020-09-22 DIAGNOSIS — I110 Hypertensive heart disease with heart failure: Secondary | ICD-10-CM | POA: Diagnosis not present

## 2020-09-25 DIAGNOSIS — M6281 Muscle weakness (generalized): Secondary | ICD-10-CM | POA: Diagnosis not present

## 2020-09-25 DIAGNOSIS — E1169 Type 2 diabetes mellitus with other specified complication: Secondary | ICD-10-CM | POA: Diagnosis not present

## 2020-09-25 DIAGNOSIS — R2689 Other abnormalities of gait and mobility: Secondary | ICD-10-CM | POA: Diagnosis not present

## 2020-09-25 DIAGNOSIS — E785 Hyperlipidemia, unspecified: Secondary | ICD-10-CM | POA: Diagnosis not present

## 2020-09-25 DIAGNOSIS — I110 Hypertensive heart disease with heart failure: Secondary | ICD-10-CM | POA: Diagnosis not present

## 2020-09-26 DIAGNOSIS — M6281 Muscle weakness (generalized): Secondary | ICD-10-CM | POA: Diagnosis not present

## 2020-09-26 DIAGNOSIS — E1169 Type 2 diabetes mellitus with other specified complication: Secondary | ICD-10-CM | POA: Diagnosis not present

## 2020-09-26 DIAGNOSIS — E785 Hyperlipidemia, unspecified: Secondary | ICD-10-CM | POA: Diagnosis not present

## 2020-09-26 DIAGNOSIS — I110 Hypertensive heart disease with heart failure: Secondary | ICD-10-CM | POA: Diagnosis not present

## 2020-09-26 DIAGNOSIS — R2689 Other abnormalities of gait and mobility: Secondary | ICD-10-CM | POA: Diagnosis not present

## 2020-09-28 DIAGNOSIS — I110 Hypertensive heart disease with heart failure: Secondary | ICD-10-CM | POA: Diagnosis not present

## 2020-09-28 DIAGNOSIS — E1169 Type 2 diabetes mellitus with other specified complication: Secondary | ICD-10-CM | POA: Diagnosis not present

## 2020-09-28 DIAGNOSIS — M6281 Muscle weakness (generalized): Secondary | ICD-10-CM | POA: Diagnosis not present

## 2020-09-28 DIAGNOSIS — R2689 Other abnormalities of gait and mobility: Secondary | ICD-10-CM | POA: Diagnosis not present

## 2020-09-28 DIAGNOSIS — E785 Hyperlipidemia, unspecified: Secondary | ICD-10-CM | POA: Diagnosis not present

## 2020-09-29 DIAGNOSIS — S8002XD Contusion of left knee, subsequent encounter: Secondary | ICD-10-CM | POA: Diagnosis not present

## 2020-09-29 DIAGNOSIS — K0889 Other specified disorders of teeth and supporting structures: Secondary | ICD-10-CM | POA: Diagnosis not present

## 2020-09-29 DIAGNOSIS — S7292XD Unspecified fracture of left femur, subsequent encounter for closed fracture with routine healing: Secondary | ICD-10-CM | POA: Diagnosis not present

## 2020-10-01 DIAGNOSIS — S7292XD Unspecified fracture of left femur, subsequent encounter for closed fracture with routine healing: Secondary | ICD-10-CM | POA: Diagnosis not present

## 2020-10-01 DIAGNOSIS — S8002XD Contusion of left knee, subsequent encounter: Secondary | ICD-10-CM | POA: Diagnosis not present

## 2020-10-03 DIAGNOSIS — E785 Hyperlipidemia, unspecified: Secondary | ICD-10-CM | POA: Diagnosis not present

## 2020-10-03 DIAGNOSIS — E1169 Type 2 diabetes mellitus with other specified complication: Secondary | ICD-10-CM | POA: Diagnosis not present

## 2020-10-03 DIAGNOSIS — I110 Hypertensive heart disease with heart failure: Secondary | ICD-10-CM | POA: Diagnosis not present

## 2020-10-03 DIAGNOSIS — M6281 Muscle weakness (generalized): Secondary | ICD-10-CM | POA: Diagnosis not present

## 2020-10-03 DIAGNOSIS — R2689 Other abnormalities of gait and mobility: Secondary | ICD-10-CM | POA: Diagnosis not present

## 2020-10-04 DIAGNOSIS — E785 Hyperlipidemia, unspecified: Secondary | ICD-10-CM | POA: Diagnosis not present

## 2020-10-04 DIAGNOSIS — I110 Hypertensive heart disease with heart failure: Secondary | ICD-10-CM | POA: Diagnosis not present

## 2020-10-04 DIAGNOSIS — E1169 Type 2 diabetes mellitus with other specified complication: Secondary | ICD-10-CM | POA: Diagnosis not present

## 2020-10-04 DIAGNOSIS — R2689 Other abnormalities of gait and mobility: Secondary | ICD-10-CM | POA: Diagnosis not present

## 2020-10-04 DIAGNOSIS — M6281 Muscle weakness (generalized): Secondary | ICD-10-CM | POA: Diagnosis not present

## 2020-10-09 DIAGNOSIS — E1169 Type 2 diabetes mellitus with other specified complication: Secondary | ICD-10-CM | POA: Diagnosis not present

## 2020-10-09 DIAGNOSIS — I110 Hypertensive heart disease with heart failure: Secondary | ICD-10-CM | POA: Diagnosis not present

## 2020-10-09 DIAGNOSIS — M6281 Muscle weakness (generalized): Secondary | ICD-10-CM | POA: Diagnosis not present

## 2020-10-09 DIAGNOSIS — E785 Hyperlipidemia, unspecified: Secondary | ICD-10-CM | POA: Diagnosis not present

## 2020-10-09 DIAGNOSIS — R2689 Other abnormalities of gait and mobility: Secondary | ICD-10-CM | POA: Diagnosis not present

## 2020-10-10 DIAGNOSIS — I110 Hypertensive heart disease with heart failure: Secondary | ICD-10-CM | POA: Diagnosis not present

## 2020-10-10 DIAGNOSIS — E785 Hyperlipidemia, unspecified: Secondary | ICD-10-CM | POA: Diagnosis not present

## 2020-10-10 DIAGNOSIS — E1169 Type 2 diabetes mellitus with other specified complication: Secondary | ICD-10-CM | POA: Diagnosis not present

## 2020-10-10 DIAGNOSIS — S8002XD Contusion of left knee, subsequent encounter: Secondary | ICD-10-CM | POA: Diagnosis not present

## 2020-10-10 DIAGNOSIS — M6281 Muscle weakness (generalized): Secondary | ICD-10-CM | POA: Diagnosis not present

## 2020-10-10 DIAGNOSIS — S7292XD Unspecified fracture of left femur, subsequent encounter for closed fracture with routine healing: Secondary | ICD-10-CM | POA: Diagnosis not present

## 2020-10-10 DIAGNOSIS — R2689 Other abnormalities of gait and mobility: Secondary | ICD-10-CM | POA: Diagnosis not present

## 2020-10-11 DIAGNOSIS — I110 Hypertensive heart disease with heart failure: Secondary | ICD-10-CM | POA: Diagnosis not present

## 2020-10-11 DIAGNOSIS — R2689 Other abnormalities of gait and mobility: Secondary | ICD-10-CM | POA: Diagnosis not present

## 2020-10-11 DIAGNOSIS — E785 Hyperlipidemia, unspecified: Secondary | ICD-10-CM | POA: Diagnosis not present

## 2020-10-11 DIAGNOSIS — M6281 Muscle weakness (generalized): Secondary | ICD-10-CM | POA: Diagnosis not present

## 2020-10-11 DIAGNOSIS — E1169 Type 2 diabetes mellitus with other specified complication: Secondary | ICD-10-CM | POA: Diagnosis not present

## 2020-10-12 DIAGNOSIS — E785 Hyperlipidemia, unspecified: Secondary | ICD-10-CM | POA: Diagnosis not present

## 2020-10-12 DIAGNOSIS — I110 Hypertensive heart disease with heart failure: Secondary | ICD-10-CM | POA: Diagnosis not present

## 2020-10-12 DIAGNOSIS — M6281 Muscle weakness (generalized): Secondary | ICD-10-CM | POA: Diagnosis not present

## 2020-10-12 DIAGNOSIS — E1169 Type 2 diabetes mellitus with other specified complication: Secondary | ICD-10-CM | POA: Diagnosis not present

## 2020-10-12 DIAGNOSIS — R2689 Other abnormalities of gait and mobility: Secondary | ICD-10-CM | POA: Diagnosis not present

## 2020-10-13 DIAGNOSIS — M6281 Muscle weakness (generalized): Secondary | ICD-10-CM | POA: Diagnosis not present

## 2020-10-13 DIAGNOSIS — R2689 Other abnormalities of gait and mobility: Secondary | ICD-10-CM | POA: Diagnosis not present

## 2020-10-13 DIAGNOSIS — E1169 Type 2 diabetes mellitus with other specified complication: Secondary | ICD-10-CM | POA: Diagnosis not present

## 2020-10-13 DIAGNOSIS — I110 Hypertensive heart disease with heart failure: Secondary | ICD-10-CM | POA: Diagnosis not present

## 2020-10-13 DIAGNOSIS — E785 Hyperlipidemia, unspecified: Secondary | ICD-10-CM | POA: Diagnosis not present

## 2020-10-16 DIAGNOSIS — E785 Hyperlipidemia, unspecified: Secondary | ICD-10-CM | POA: Diagnosis not present

## 2020-10-16 DIAGNOSIS — E1169 Type 2 diabetes mellitus with other specified complication: Secondary | ICD-10-CM | POA: Diagnosis not present

## 2020-10-16 DIAGNOSIS — R2689 Other abnormalities of gait and mobility: Secondary | ICD-10-CM | POA: Diagnosis not present

## 2020-10-16 DIAGNOSIS — M6281 Muscle weakness (generalized): Secondary | ICD-10-CM | POA: Diagnosis not present

## 2020-10-16 DIAGNOSIS — I110 Hypertensive heart disease with heart failure: Secondary | ICD-10-CM | POA: Diagnosis not present

## 2020-10-17 DIAGNOSIS — M6281 Muscle weakness (generalized): Secondary | ICD-10-CM | POA: Diagnosis not present

## 2020-10-17 DIAGNOSIS — E1169 Type 2 diabetes mellitus with other specified complication: Secondary | ICD-10-CM | POA: Diagnosis not present

## 2020-10-17 DIAGNOSIS — I110 Hypertensive heart disease with heart failure: Secondary | ICD-10-CM | POA: Diagnosis not present

## 2020-10-17 DIAGNOSIS — E785 Hyperlipidemia, unspecified: Secondary | ICD-10-CM | POA: Diagnosis not present

## 2020-10-17 DIAGNOSIS — R2689 Other abnormalities of gait and mobility: Secondary | ICD-10-CM | POA: Diagnosis not present

## 2020-10-18 DIAGNOSIS — R2689 Other abnormalities of gait and mobility: Secondary | ICD-10-CM | POA: Diagnosis not present

## 2020-10-18 DIAGNOSIS — E785 Hyperlipidemia, unspecified: Secondary | ICD-10-CM | POA: Diagnosis not present

## 2020-10-18 DIAGNOSIS — E1169 Type 2 diabetes mellitus with other specified complication: Secondary | ICD-10-CM | POA: Diagnosis not present

## 2020-10-18 DIAGNOSIS — I110 Hypertensive heart disease with heart failure: Secondary | ICD-10-CM | POA: Diagnosis not present

## 2020-10-18 DIAGNOSIS — M6281 Muscle weakness (generalized): Secondary | ICD-10-CM | POA: Diagnosis not present

## 2020-10-19 DIAGNOSIS — I110 Hypertensive heart disease with heart failure: Secondary | ICD-10-CM | POA: Diagnosis not present

## 2020-10-19 DIAGNOSIS — R2689 Other abnormalities of gait and mobility: Secondary | ICD-10-CM | POA: Diagnosis not present

## 2020-10-19 DIAGNOSIS — E1169 Type 2 diabetes mellitus with other specified complication: Secondary | ICD-10-CM | POA: Diagnosis not present

## 2020-10-19 DIAGNOSIS — E785 Hyperlipidemia, unspecified: Secondary | ICD-10-CM | POA: Diagnosis not present

## 2020-10-19 DIAGNOSIS — M6281 Muscle weakness (generalized): Secondary | ICD-10-CM | POA: Diagnosis not present

## 2020-10-20 DIAGNOSIS — I110 Hypertensive heart disease with heart failure: Secondary | ICD-10-CM | POA: Diagnosis not present

## 2020-10-20 DIAGNOSIS — R2689 Other abnormalities of gait and mobility: Secondary | ICD-10-CM | POA: Diagnosis not present

## 2020-10-20 DIAGNOSIS — E785 Hyperlipidemia, unspecified: Secondary | ICD-10-CM | POA: Diagnosis not present

## 2020-10-20 DIAGNOSIS — M6281 Muscle weakness (generalized): Secondary | ICD-10-CM | POA: Diagnosis not present

## 2020-10-20 DIAGNOSIS — E1169 Type 2 diabetes mellitus with other specified complication: Secondary | ICD-10-CM | POA: Diagnosis not present

## 2020-10-23 ENCOUNTER — Telehealth: Payer: Self-pay | Admitting: *Deleted

## 2020-10-23 DIAGNOSIS — E785 Hyperlipidemia, unspecified: Secondary | ICD-10-CM | POA: Diagnosis not present

## 2020-10-23 DIAGNOSIS — R2689 Other abnormalities of gait and mobility: Secondary | ICD-10-CM | POA: Diagnosis not present

## 2020-10-23 DIAGNOSIS — I110 Hypertensive heart disease with heart failure: Secondary | ICD-10-CM | POA: Diagnosis not present

## 2020-10-23 DIAGNOSIS — E1169 Type 2 diabetes mellitus with other specified complication: Secondary | ICD-10-CM | POA: Diagnosis not present

## 2020-10-23 DIAGNOSIS — M6281 Muscle weakness (generalized): Secondary | ICD-10-CM | POA: Diagnosis not present

## 2020-10-23 NOTE — Telephone Encounter (Signed)
Patient is calling to get status on upcoming appointment w/ Dr Prudence Davidson.Please call.  Returned the call back to patient to give appointment information, no answer, could not leave vmessage.

## 2020-10-24 DIAGNOSIS — R2689 Other abnormalities of gait and mobility: Secondary | ICD-10-CM | POA: Diagnosis not present

## 2020-10-24 DIAGNOSIS — I110 Hypertensive heart disease with heart failure: Secondary | ICD-10-CM | POA: Diagnosis not present

## 2020-10-24 DIAGNOSIS — M6281 Muscle weakness (generalized): Secondary | ICD-10-CM | POA: Diagnosis not present

## 2020-10-24 DIAGNOSIS — E785 Hyperlipidemia, unspecified: Secondary | ICD-10-CM | POA: Diagnosis not present

## 2020-10-24 DIAGNOSIS — E1169 Type 2 diabetes mellitus with other specified complication: Secondary | ICD-10-CM | POA: Diagnosis not present

## 2020-10-27 DIAGNOSIS — E119 Type 2 diabetes mellitus without complications: Secondary | ICD-10-CM | POA: Diagnosis not present

## 2020-10-31 DIAGNOSIS — I110 Hypertensive heart disease with heart failure: Secondary | ICD-10-CM | POA: Diagnosis not present

## 2020-10-31 DIAGNOSIS — M6281 Muscle weakness (generalized): Secondary | ICD-10-CM | POA: Diagnosis not present

## 2020-10-31 DIAGNOSIS — R2689 Other abnormalities of gait and mobility: Secondary | ICD-10-CM | POA: Diagnosis not present

## 2020-10-31 DIAGNOSIS — E785 Hyperlipidemia, unspecified: Secondary | ICD-10-CM | POA: Diagnosis not present

## 2020-10-31 DIAGNOSIS — E1169 Type 2 diabetes mellitus with other specified complication: Secondary | ICD-10-CM | POA: Diagnosis not present

## 2020-10-31 NOTE — Telephone Encounter (Signed)
Tried to called the patient back to let him know he has a scheduled appointment with Dr.mayer on 11/27/20 , patient did not answer  , I couldn't leave a voicemail

## 2020-11-01 DIAGNOSIS — E785 Hyperlipidemia, unspecified: Secondary | ICD-10-CM | POA: Diagnosis not present

## 2020-11-01 DIAGNOSIS — M6281 Muscle weakness (generalized): Secondary | ICD-10-CM | POA: Diagnosis not present

## 2020-11-01 DIAGNOSIS — E1169 Type 2 diabetes mellitus with other specified complication: Secondary | ICD-10-CM | POA: Diagnosis not present

## 2020-11-01 DIAGNOSIS — R2689 Other abnormalities of gait and mobility: Secondary | ICD-10-CM | POA: Diagnosis not present

## 2020-11-01 DIAGNOSIS — I110 Hypertensive heart disease with heart failure: Secondary | ICD-10-CM | POA: Diagnosis not present

## 2020-11-03 DIAGNOSIS — E1169 Type 2 diabetes mellitus with other specified complication: Secondary | ICD-10-CM | POA: Diagnosis not present

## 2020-11-03 DIAGNOSIS — R2689 Other abnormalities of gait and mobility: Secondary | ICD-10-CM | POA: Diagnosis not present

## 2020-11-03 DIAGNOSIS — M6281 Muscle weakness (generalized): Secondary | ICD-10-CM | POA: Diagnosis not present

## 2020-11-03 DIAGNOSIS — E785 Hyperlipidemia, unspecified: Secondary | ICD-10-CM | POA: Diagnosis not present

## 2020-11-03 DIAGNOSIS — I110 Hypertensive heart disease with heart failure: Secondary | ICD-10-CM | POA: Diagnosis not present

## 2020-11-05 DIAGNOSIS — E785 Hyperlipidemia, unspecified: Secondary | ICD-10-CM | POA: Diagnosis not present

## 2020-11-05 DIAGNOSIS — I110 Hypertensive heart disease with heart failure: Secondary | ICD-10-CM | POA: Diagnosis not present

## 2020-11-05 DIAGNOSIS — E1169 Type 2 diabetes mellitus with other specified complication: Secondary | ICD-10-CM | POA: Diagnosis not present

## 2020-11-05 DIAGNOSIS — R2689 Other abnormalities of gait and mobility: Secondary | ICD-10-CM | POA: Diagnosis not present

## 2020-11-05 DIAGNOSIS — M6281 Muscle weakness (generalized): Secondary | ICD-10-CM | POA: Diagnosis not present

## 2020-11-06 DIAGNOSIS — E785 Hyperlipidemia, unspecified: Secondary | ICD-10-CM | POA: Diagnosis not present

## 2020-11-06 DIAGNOSIS — R2689 Other abnormalities of gait and mobility: Secondary | ICD-10-CM | POA: Diagnosis not present

## 2020-11-06 DIAGNOSIS — E1169 Type 2 diabetes mellitus with other specified complication: Secondary | ICD-10-CM | POA: Diagnosis not present

## 2020-11-06 DIAGNOSIS — M6281 Muscle weakness (generalized): Secondary | ICD-10-CM | POA: Diagnosis not present

## 2020-11-06 DIAGNOSIS — I110 Hypertensive heart disease with heart failure: Secondary | ICD-10-CM | POA: Diagnosis not present

## 2020-11-07 DIAGNOSIS — I110 Hypertensive heart disease with heart failure: Secondary | ICD-10-CM | POA: Diagnosis not present

## 2020-11-07 DIAGNOSIS — M6281 Muscle weakness (generalized): Secondary | ICD-10-CM | POA: Diagnosis not present

## 2020-11-07 DIAGNOSIS — R2689 Other abnormalities of gait and mobility: Secondary | ICD-10-CM | POA: Diagnosis not present

## 2020-11-07 DIAGNOSIS — E1169 Type 2 diabetes mellitus with other specified complication: Secondary | ICD-10-CM | POA: Diagnosis not present

## 2020-11-07 DIAGNOSIS — E785 Hyperlipidemia, unspecified: Secondary | ICD-10-CM | POA: Diagnosis not present

## 2020-11-08 DIAGNOSIS — E785 Hyperlipidemia, unspecified: Secondary | ICD-10-CM | POA: Diagnosis not present

## 2020-11-08 DIAGNOSIS — M6281 Muscle weakness (generalized): Secondary | ICD-10-CM | POA: Diagnosis not present

## 2020-11-08 DIAGNOSIS — R2689 Other abnormalities of gait and mobility: Secondary | ICD-10-CM | POA: Diagnosis not present

## 2020-11-08 DIAGNOSIS — I110 Hypertensive heart disease with heart failure: Secondary | ICD-10-CM | POA: Diagnosis not present

## 2020-11-08 DIAGNOSIS — E1169 Type 2 diabetes mellitus with other specified complication: Secondary | ICD-10-CM | POA: Diagnosis not present

## 2020-11-15 DIAGNOSIS — S7292XA Unspecified fracture of left femur, initial encounter for closed fracture: Secondary | ICD-10-CM | POA: Diagnosis not present

## 2020-11-15 DIAGNOSIS — E1122 Type 2 diabetes mellitus with diabetic chronic kidney disease: Secondary | ICD-10-CM | POA: Diagnosis not present

## 2020-11-18 DIAGNOSIS — K5909 Other constipation: Secondary | ICD-10-CM | POA: Diagnosis not present

## 2020-11-18 DIAGNOSIS — K219 Gastro-esophageal reflux disease without esophagitis: Secondary | ICD-10-CM | POA: Diagnosis not present

## 2020-11-18 DIAGNOSIS — I10 Essential (primary) hypertension: Secondary | ICD-10-CM | POA: Diagnosis not present

## 2020-11-24 DIAGNOSIS — E119 Type 2 diabetes mellitus without complications: Secondary | ICD-10-CM | POA: Diagnosis not present

## 2020-11-27 ENCOUNTER — Ambulatory Visit (INDEPENDENT_AMBULATORY_CARE_PROVIDER_SITE_OTHER): Payer: Medicare Other | Admitting: Podiatry

## 2020-11-27 ENCOUNTER — Encounter: Payer: Self-pay | Admitting: Podiatry

## 2020-11-27 ENCOUNTER — Other Ambulatory Visit: Payer: Self-pay

## 2020-11-27 DIAGNOSIS — E1159 Type 2 diabetes mellitus with other circulatory complications: Secondary | ICD-10-CM | POA: Diagnosis not present

## 2020-11-27 DIAGNOSIS — B351 Tinea unguium: Secondary | ICD-10-CM | POA: Diagnosis not present

## 2020-11-27 DIAGNOSIS — M79609 Pain in unspecified limb: Secondary | ICD-10-CM

## 2020-11-27 DIAGNOSIS — M2041 Other hammer toe(s) (acquired), right foot: Secondary | ICD-10-CM

## 2020-11-27 DIAGNOSIS — M2042 Other hammer toe(s) (acquired), left foot: Secondary | ICD-10-CM | POA: Diagnosis not present

## 2020-11-27 NOTE — Progress Notes (Addendum)
This patient returns to my office for at risk foot care.  This patient requires this care by a professional since this patient will be at risk due to having CKD and diabetes.  This patient is unable to cut nails himself since the patient cannot reach his nails.These nails are painful walking and wearing shoes. This patient presents to the office in a wheelchair.  This patient presents for at risk foot care today.  General Appearance  Alert, conversant and in no acute stress.  Vascular  Dorsalis pedis and posterior tibial  pulses are weakly  palpable  bilaterally.  Capillary return is within normal limits  bilaterally. Cold feet  Bilaterally. Absent digital hair B/L.  Neurologic  Senn-Weinstein monofilament wire test diminished   bilaterally. Muscle power within normal limits bilaterally.  Nails Thick disfigured discolored nails with subungual debris  from hallux to fifth toes bilaterally. No evidence of bacterial infection or drainage bilaterally.  Orthopedic  No limitations of motion  feet .  No crepitus or effusions noted.  No bony pathology or digital deformities noted.  Skin  normotropic skin with no porokeratosis noted bilaterally.  No signs of infections or ulcers noted.     Onychomycosis  Pain in right toes  Pain in left toes  Consent was obtained for treatment procedures.   Mechanical debridement of nails 1-5  bilaterally performed with a nail nipper.    RTC 3 months.  To consider diabetic shoes.   Return office visit   3 months                  Told patient to return for periodic foot care and evaluation due to potential at risk complications.   Gardiner Barefoot DPM

## 2020-11-28 DIAGNOSIS — R2689 Other abnormalities of gait and mobility: Secondary | ICD-10-CM | POA: Diagnosis not present

## 2020-11-28 DIAGNOSIS — E1169 Type 2 diabetes mellitus with other specified complication: Secondary | ICD-10-CM | POA: Diagnosis not present

## 2020-11-28 DIAGNOSIS — M6281 Muscle weakness (generalized): Secondary | ICD-10-CM | POA: Diagnosis not present

## 2020-11-28 DIAGNOSIS — E785 Hyperlipidemia, unspecified: Secondary | ICD-10-CM | POA: Diagnosis not present

## 2020-11-28 DIAGNOSIS — I110 Hypertensive heart disease with heart failure: Secondary | ICD-10-CM | POA: Diagnosis not present

## 2020-11-29 DIAGNOSIS — M6281 Muscle weakness (generalized): Secondary | ICD-10-CM | POA: Diagnosis not present

## 2020-11-29 DIAGNOSIS — I110 Hypertensive heart disease with heart failure: Secondary | ICD-10-CM | POA: Diagnosis not present

## 2020-11-29 DIAGNOSIS — R2689 Other abnormalities of gait and mobility: Secondary | ICD-10-CM | POA: Diagnosis not present

## 2020-11-29 DIAGNOSIS — E1169 Type 2 diabetes mellitus with other specified complication: Secondary | ICD-10-CM | POA: Diagnosis not present

## 2020-11-29 DIAGNOSIS — E785 Hyperlipidemia, unspecified: Secondary | ICD-10-CM | POA: Diagnosis not present

## 2020-11-30 DIAGNOSIS — M6281 Muscle weakness (generalized): Secondary | ICD-10-CM | POA: Diagnosis not present

## 2020-11-30 DIAGNOSIS — I110 Hypertensive heart disease with heart failure: Secondary | ICD-10-CM | POA: Diagnosis not present

## 2020-11-30 DIAGNOSIS — E1169 Type 2 diabetes mellitus with other specified complication: Secondary | ICD-10-CM | POA: Diagnosis not present

## 2020-11-30 DIAGNOSIS — R2689 Other abnormalities of gait and mobility: Secondary | ICD-10-CM | POA: Diagnosis not present

## 2020-11-30 DIAGNOSIS — E785 Hyperlipidemia, unspecified: Secondary | ICD-10-CM | POA: Diagnosis not present

## 2020-12-01 DIAGNOSIS — E119 Type 2 diabetes mellitus without complications: Secondary | ICD-10-CM | POA: Diagnosis not present

## 2020-12-01 DIAGNOSIS — R2689 Other abnormalities of gait and mobility: Secondary | ICD-10-CM | POA: Diagnosis not present

## 2020-12-01 DIAGNOSIS — I110 Hypertensive heart disease with heart failure: Secondary | ICD-10-CM | POA: Diagnosis not present

## 2020-12-01 DIAGNOSIS — E785 Hyperlipidemia, unspecified: Secondary | ICD-10-CM | POA: Diagnosis not present

## 2020-12-01 DIAGNOSIS — E1169 Type 2 diabetes mellitus with other specified complication: Secondary | ICD-10-CM | POA: Diagnosis not present

## 2020-12-01 DIAGNOSIS — M6281 Muscle weakness (generalized): Secondary | ICD-10-CM | POA: Diagnosis not present

## 2020-12-03 DIAGNOSIS — K219 Gastro-esophageal reflux disease without esophagitis: Secondary | ICD-10-CM | POA: Diagnosis not present

## 2020-12-03 DIAGNOSIS — K5909 Other constipation: Secondary | ICD-10-CM | POA: Diagnosis not present

## 2020-12-03 DIAGNOSIS — I10 Essential (primary) hypertension: Secondary | ICD-10-CM | POA: Diagnosis not present

## 2020-12-04 DIAGNOSIS — E785 Hyperlipidemia, unspecified: Secondary | ICD-10-CM | POA: Diagnosis not present

## 2020-12-04 DIAGNOSIS — E1169 Type 2 diabetes mellitus with other specified complication: Secondary | ICD-10-CM | POA: Diagnosis not present

## 2020-12-04 DIAGNOSIS — R2689 Other abnormalities of gait and mobility: Secondary | ICD-10-CM | POA: Diagnosis not present

## 2020-12-04 DIAGNOSIS — M6281 Muscle weakness (generalized): Secondary | ICD-10-CM | POA: Diagnosis not present

## 2020-12-04 DIAGNOSIS — I110 Hypertensive heart disease with heart failure: Secondary | ICD-10-CM | POA: Diagnosis not present

## 2020-12-05 DIAGNOSIS — R2689 Other abnormalities of gait and mobility: Secondary | ICD-10-CM | POA: Diagnosis not present

## 2020-12-05 DIAGNOSIS — I110 Hypertensive heart disease with heart failure: Secondary | ICD-10-CM | POA: Diagnosis not present

## 2020-12-05 DIAGNOSIS — E1169 Type 2 diabetes mellitus with other specified complication: Secondary | ICD-10-CM | POA: Diagnosis not present

## 2020-12-05 DIAGNOSIS — M6281 Muscle weakness (generalized): Secondary | ICD-10-CM | POA: Diagnosis not present

## 2020-12-05 DIAGNOSIS — E785 Hyperlipidemia, unspecified: Secondary | ICD-10-CM | POA: Diagnosis not present

## 2020-12-06 DIAGNOSIS — M6281 Muscle weakness (generalized): Secondary | ICD-10-CM | POA: Diagnosis not present

## 2020-12-06 DIAGNOSIS — E1169 Type 2 diabetes mellitus with other specified complication: Secondary | ICD-10-CM | POA: Diagnosis not present

## 2020-12-06 DIAGNOSIS — I110 Hypertensive heart disease with heart failure: Secondary | ICD-10-CM | POA: Diagnosis not present

## 2020-12-06 DIAGNOSIS — E785 Hyperlipidemia, unspecified: Secondary | ICD-10-CM | POA: Diagnosis not present

## 2020-12-06 DIAGNOSIS — R2689 Other abnormalities of gait and mobility: Secondary | ICD-10-CM | POA: Diagnosis not present

## 2020-12-07 DIAGNOSIS — E785 Hyperlipidemia, unspecified: Secondary | ICD-10-CM | POA: Diagnosis not present

## 2020-12-07 DIAGNOSIS — I110 Hypertensive heart disease with heart failure: Secondary | ICD-10-CM | POA: Diagnosis not present

## 2020-12-07 DIAGNOSIS — E1169 Type 2 diabetes mellitus with other specified complication: Secondary | ICD-10-CM | POA: Diagnosis not present

## 2020-12-07 DIAGNOSIS — M6281 Muscle weakness (generalized): Secondary | ICD-10-CM | POA: Diagnosis not present

## 2020-12-07 DIAGNOSIS — R2689 Other abnormalities of gait and mobility: Secondary | ICD-10-CM | POA: Diagnosis not present

## 2020-12-08 DIAGNOSIS — E785 Hyperlipidemia, unspecified: Secondary | ICD-10-CM | POA: Diagnosis not present

## 2020-12-08 DIAGNOSIS — E1169 Type 2 diabetes mellitus with other specified complication: Secondary | ICD-10-CM | POA: Diagnosis not present

## 2020-12-08 DIAGNOSIS — R2689 Other abnormalities of gait and mobility: Secondary | ICD-10-CM | POA: Diagnosis not present

## 2020-12-08 DIAGNOSIS — I10 Essential (primary) hypertension: Secondary | ICD-10-CM | POA: Diagnosis not present

## 2020-12-08 DIAGNOSIS — E119 Type 2 diabetes mellitus without complications: Secondary | ICD-10-CM | POA: Diagnosis not present

## 2020-12-08 DIAGNOSIS — M6281 Muscle weakness (generalized): Secondary | ICD-10-CM | POA: Diagnosis not present

## 2020-12-08 DIAGNOSIS — I110 Hypertensive heart disease with heart failure: Secondary | ICD-10-CM | POA: Diagnosis not present

## 2020-12-09 DIAGNOSIS — K219 Gastro-esophageal reflux disease without esophagitis: Secondary | ICD-10-CM | POA: Diagnosis not present

## 2020-12-09 DIAGNOSIS — I10 Essential (primary) hypertension: Secondary | ICD-10-CM | POA: Diagnosis not present

## 2020-12-11 DIAGNOSIS — M6281 Muscle weakness (generalized): Secondary | ICD-10-CM | POA: Diagnosis not present

## 2020-12-11 DIAGNOSIS — E1169 Type 2 diabetes mellitus with other specified complication: Secondary | ICD-10-CM | POA: Diagnosis not present

## 2020-12-11 DIAGNOSIS — E785 Hyperlipidemia, unspecified: Secondary | ICD-10-CM | POA: Diagnosis not present

## 2020-12-11 DIAGNOSIS — I110 Hypertensive heart disease with heart failure: Secondary | ICD-10-CM | POA: Diagnosis not present

## 2020-12-11 DIAGNOSIS — R2689 Other abnormalities of gait and mobility: Secondary | ICD-10-CM | POA: Diagnosis not present

## 2020-12-12 DIAGNOSIS — I110 Hypertensive heart disease with heart failure: Secondary | ICD-10-CM | POA: Diagnosis not present

## 2020-12-12 DIAGNOSIS — E785 Hyperlipidemia, unspecified: Secondary | ICD-10-CM | POA: Diagnosis not present

## 2020-12-12 DIAGNOSIS — E1169 Type 2 diabetes mellitus with other specified complication: Secondary | ICD-10-CM | POA: Diagnosis not present

## 2020-12-12 DIAGNOSIS — R2689 Other abnormalities of gait and mobility: Secondary | ICD-10-CM | POA: Diagnosis not present

## 2020-12-12 DIAGNOSIS — M6281 Muscle weakness (generalized): Secondary | ICD-10-CM | POA: Diagnosis not present

## 2020-12-13 DIAGNOSIS — I110 Hypertensive heart disease with heart failure: Secondary | ICD-10-CM | POA: Diagnosis not present

## 2020-12-13 DIAGNOSIS — E785 Hyperlipidemia, unspecified: Secondary | ICD-10-CM | POA: Diagnosis not present

## 2020-12-13 DIAGNOSIS — R2689 Other abnormalities of gait and mobility: Secondary | ICD-10-CM | POA: Diagnosis not present

## 2020-12-13 DIAGNOSIS — E1169 Type 2 diabetes mellitus with other specified complication: Secondary | ICD-10-CM | POA: Diagnosis not present

## 2020-12-13 DIAGNOSIS — M6281 Muscle weakness (generalized): Secondary | ICD-10-CM | POA: Diagnosis not present

## 2020-12-14 DIAGNOSIS — E785 Hyperlipidemia, unspecified: Secondary | ICD-10-CM | POA: Diagnosis not present

## 2020-12-14 DIAGNOSIS — I110 Hypertensive heart disease with heart failure: Secondary | ICD-10-CM | POA: Diagnosis not present

## 2020-12-14 DIAGNOSIS — R2689 Other abnormalities of gait and mobility: Secondary | ICD-10-CM | POA: Diagnosis not present

## 2020-12-14 DIAGNOSIS — M6281 Muscle weakness (generalized): Secondary | ICD-10-CM | POA: Diagnosis not present

## 2020-12-14 DIAGNOSIS — E1169 Type 2 diabetes mellitus with other specified complication: Secondary | ICD-10-CM | POA: Diagnosis not present

## 2020-12-15 DIAGNOSIS — E1169 Type 2 diabetes mellitus with other specified complication: Secondary | ICD-10-CM | POA: Diagnosis not present

## 2020-12-15 DIAGNOSIS — R2689 Other abnormalities of gait and mobility: Secondary | ICD-10-CM | POA: Diagnosis not present

## 2020-12-15 DIAGNOSIS — I110 Hypertensive heart disease with heart failure: Secondary | ICD-10-CM | POA: Diagnosis not present

## 2020-12-15 DIAGNOSIS — M6281 Muscle weakness (generalized): Secondary | ICD-10-CM | POA: Diagnosis not present

## 2020-12-15 DIAGNOSIS — E785 Hyperlipidemia, unspecified: Secondary | ICD-10-CM | POA: Diagnosis not present

## 2020-12-17 DIAGNOSIS — E1169 Type 2 diabetes mellitus with other specified complication: Secondary | ICD-10-CM | POA: Diagnosis not present

## 2020-12-17 DIAGNOSIS — I110 Hypertensive heart disease with heart failure: Secondary | ICD-10-CM | POA: Diagnosis not present

## 2020-12-17 DIAGNOSIS — M6281 Muscle weakness (generalized): Secondary | ICD-10-CM | POA: Diagnosis not present

## 2020-12-17 DIAGNOSIS — R2689 Other abnormalities of gait and mobility: Secondary | ICD-10-CM | POA: Diagnosis not present

## 2020-12-17 DIAGNOSIS — E785 Hyperlipidemia, unspecified: Secondary | ICD-10-CM | POA: Diagnosis not present

## 2020-12-18 DIAGNOSIS — I110 Hypertensive heart disease with heart failure: Secondary | ICD-10-CM | POA: Diagnosis not present

## 2020-12-18 DIAGNOSIS — E1169 Type 2 diabetes mellitus with other specified complication: Secondary | ICD-10-CM | POA: Diagnosis not present

## 2020-12-18 DIAGNOSIS — E785 Hyperlipidemia, unspecified: Secondary | ICD-10-CM | POA: Diagnosis not present

## 2020-12-18 DIAGNOSIS — R2689 Other abnormalities of gait and mobility: Secondary | ICD-10-CM | POA: Diagnosis not present

## 2020-12-18 DIAGNOSIS — I10 Essential (primary) hypertension: Secondary | ICD-10-CM | POA: Diagnosis not present

## 2020-12-18 DIAGNOSIS — M6281 Muscle weakness (generalized): Secondary | ICD-10-CM | POA: Diagnosis not present

## 2020-12-19 DIAGNOSIS — E785 Hyperlipidemia, unspecified: Secondary | ICD-10-CM | POA: Diagnosis not present

## 2020-12-19 DIAGNOSIS — I110 Hypertensive heart disease with heart failure: Secondary | ICD-10-CM | POA: Diagnosis not present

## 2020-12-19 DIAGNOSIS — M6281 Muscle weakness (generalized): Secondary | ICD-10-CM | POA: Diagnosis not present

## 2020-12-19 DIAGNOSIS — R2689 Other abnormalities of gait and mobility: Secondary | ICD-10-CM | POA: Diagnosis not present

## 2020-12-19 DIAGNOSIS — E1169 Type 2 diabetes mellitus with other specified complication: Secondary | ICD-10-CM | POA: Diagnosis not present

## 2020-12-20 DIAGNOSIS — M6281 Muscle weakness (generalized): Secondary | ICD-10-CM | POA: Diagnosis not present

## 2020-12-20 DIAGNOSIS — E785 Hyperlipidemia, unspecified: Secondary | ICD-10-CM | POA: Diagnosis not present

## 2020-12-20 DIAGNOSIS — E1169 Type 2 diabetes mellitus with other specified complication: Secondary | ICD-10-CM | POA: Diagnosis not present

## 2020-12-20 DIAGNOSIS — R2689 Other abnormalities of gait and mobility: Secondary | ICD-10-CM | POA: Diagnosis not present

## 2020-12-20 DIAGNOSIS — I110 Hypertensive heart disease with heart failure: Secondary | ICD-10-CM | POA: Diagnosis not present

## 2020-12-21 DIAGNOSIS — R2689 Other abnormalities of gait and mobility: Secondary | ICD-10-CM | POA: Diagnosis not present

## 2020-12-21 DIAGNOSIS — M6281 Muscle weakness (generalized): Secondary | ICD-10-CM | POA: Diagnosis not present

## 2020-12-21 DIAGNOSIS — E785 Hyperlipidemia, unspecified: Secondary | ICD-10-CM | POA: Diagnosis not present

## 2020-12-21 DIAGNOSIS — E1169 Type 2 diabetes mellitus with other specified complication: Secondary | ICD-10-CM | POA: Diagnosis not present

## 2020-12-21 DIAGNOSIS — I110 Hypertensive heart disease with heart failure: Secondary | ICD-10-CM | POA: Diagnosis not present

## 2020-12-22 DIAGNOSIS — M6281 Muscle weakness (generalized): Secondary | ICD-10-CM | POA: Diagnosis not present

## 2020-12-22 DIAGNOSIS — E785 Hyperlipidemia, unspecified: Secondary | ICD-10-CM | POA: Diagnosis not present

## 2020-12-22 DIAGNOSIS — R2689 Other abnormalities of gait and mobility: Secondary | ICD-10-CM | POA: Diagnosis not present

## 2020-12-22 DIAGNOSIS — E1169 Type 2 diabetes mellitus with other specified complication: Secondary | ICD-10-CM | POA: Diagnosis not present

## 2020-12-22 DIAGNOSIS — I110 Hypertensive heart disease with heart failure: Secondary | ICD-10-CM | POA: Diagnosis not present

## 2020-12-24 DIAGNOSIS — I10 Essential (primary) hypertension: Secondary | ICD-10-CM | POA: Diagnosis not present

## 2020-12-24 DIAGNOSIS — I110 Hypertensive heart disease with heart failure: Secondary | ICD-10-CM | POA: Diagnosis not present

## 2020-12-24 DIAGNOSIS — E785 Hyperlipidemia, unspecified: Secondary | ICD-10-CM | POA: Diagnosis not present

## 2020-12-24 DIAGNOSIS — K219 Gastro-esophageal reflux disease without esophagitis: Secondary | ICD-10-CM | POA: Diagnosis not present

## 2020-12-24 DIAGNOSIS — M6281 Muscle weakness (generalized): Secondary | ICD-10-CM | POA: Diagnosis not present

## 2020-12-24 DIAGNOSIS — R2689 Other abnormalities of gait and mobility: Secondary | ICD-10-CM | POA: Diagnosis not present

## 2020-12-24 DIAGNOSIS — E1169 Type 2 diabetes mellitus with other specified complication: Secondary | ICD-10-CM | POA: Diagnosis not present

## 2020-12-25 DIAGNOSIS — R2689 Other abnormalities of gait and mobility: Secondary | ICD-10-CM | POA: Diagnosis not present

## 2020-12-25 DIAGNOSIS — E1169 Type 2 diabetes mellitus with other specified complication: Secondary | ICD-10-CM | POA: Diagnosis not present

## 2020-12-25 DIAGNOSIS — M6281 Muscle weakness (generalized): Secondary | ICD-10-CM | POA: Diagnosis not present

## 2020-12-25 DIAGNOSIS — I110 Hypertensive heart disease with heart failure: Secondary | ICD-10-CM | POA: Diagnosis not present

## 2020-12-25 DIAGNOSIS — E785 Hyperlipidemia, unspecified: Secondary | ICD-10-CM | POA: Diagnosis not present

## 2020-12-26 DIAGNOSIS — E785 Hyperlipidemia, unspecified: Secondary | ICD-10-CM | POA: Diagnosis not present

## 2020-12-26 DIAGNOSIS — M6281 Muscle weakness (generalized): Secondary | ICD-10-CM | POA: Diagnosis not present

## 2020-12-26 DIAGNOSIS — R2689 Other abnormalities of gait and mobility: Secondary | ICD-10-CM | POA: Diagnosis not present

## 2020-12-26 DIAGNOSIS — E1169 Type 2 diabetes mellitus with other specified complication: Secondary | ICD-10-CM | POA: Diagnosis not present

## 2020-12-26 DIAGNOSIS — I110 Hypertensive heart disease with heart failure: Secondary | ICD-10-CM | POA: Diagnosis not present

## 2020-12-27 DIAGNOSIS — I110 Hypertensive heart disease with heart failure: Secondary | ICD-10-CM | POA: Diagnosis not present

## 2020-12-27 DIAGNOSIS — M6281 Muscle weakness (generalized): Secondary | ICD-10-CM | POA: Diagnosis not present

## 2020-12-27 DIAGNOSIS — E1169 Type 2 diabetes mellitus with other specified complication: Secondary | ICD-10-CM | POA: Diagnosis not present

## 2020-12-27 DIAGNOSIS — E785 Hyperlipidemia, unspecified: Secondary | ICD-10-CM | POA: Diagnosis not present

## 2020-12-27 DIAGNOSIS — R2689 Other abnormalities of gait and mobility: Secondary | ICD-10-CM | POA: Diagnosis not present

## 2020-12-28 DIAGNOSIS — R2689 Other abnormalities of gait and mobility: Secondary | ICD-10-CM | POA: Diagnosis not present

## 2020-12-28 DIAGNOSIS — M6281 Muscle weakness (generalized): Secondary | ICD-10-CM | POA: Diagnosis not present

## 2020-12-28 DIAGNOSIS — W19XXXD Unspecified fall, subsequent encounter: Secondary | ICD-10-CM | POA: Diagnosis not present

## 2020-12-28 DIAGNOSIS — G4709 Other insomnia: Secondary | ICD-10-CM | POA: Diagnosis not present

## 2020-12-28 DIAGNOSIS — I110 Hypertensive heart disease with heart failure: Secondary | ICD-10-CM | POA: Diagnosis not present

## 2020-12-28 DIAGNOSIS — E1169 Type 2 diabetes mellitus with other specified complication: Secondary | ICD-10-CM | POA: Diagnosis not present

## 2020-12-28 DIAGNOSIS — I10 Essential (primary) hypertension: Secondary | ICD-10-CM | POA: Diagnosis not present

## 2020-12-28 DIAGNOSIS — K219 Gastro-esophageal reflux disease without esophagitis: Secondary | ICD-10-CM | POA: Diagnosis not present

## 2020-12-28 DIAGNOSIS — E785 Hyperlipidemia, unspecified: Secondary | ICD-10-CM | POA: Diagnosis not present

## 2020-12-29 DIAGNOSIS — I110 Hypertensive heart disease with heart failure: Secondary | ICD-10-CM | POA: Diagnosis not present

## 2020-12-29 DIAGNOSIS — E785 Hyperlipidemia, unspecified: Secondary | ICD-10-CM | POA: Diagnosis not present

## 2020-12-29 DIAGNOSIS — M6281 Muscle weakness (generalized): Secondary | ICD-10-CM | POA: Diagnosis not present

## 2020-12-29 DIAGNOSIS — E1169 Type 2 diabetes mellitus with other specified complication: Secondary | ICD-10-CM | POA: Diagnosis not present

## 2020-12-29 DIAGNOSIS — R2689 Other abnormalities of gait and mobility: Secondary | ICD-10-CM | POA: Diagnosis not present

## 2021-01-01 DIAGNOSIS — E1169 Type 2 diabetes mellitus with other specified complication: Secondary | ICD-10-CM | POA: Diagnosis not present

## 2021-01-01 DIAGNOSIS — E785 Hyperlipidemia, unspecified: Secondary | ICD-10-CM | POA: Diagnosis not present

## 2021-01-01 DIAGNOSIS — I110 Hypertensive heart disease with heart failure: Secondary | ICD-10-CM | POA: Diagnosis not present

## 2021-01-01 DIAGNOSIS — R2689 Other abnormalities of gait and mobility: Secondary | ICD-10-CM | POA: Diagnosis not present

## 2021-01-01 DIAGNOSIS — M6281 Muscle weakness (generalized): Secondary | ICD-10-CM | POA: Diagnosis not present

## 2021-01-02 DIAGNOSIS — E785 Hyperlipidemia, unspecified: Secondary | ICD-10-CM | POA: Diagnosis not present

## 2021-01-02 DIAGNOSIS — E1169 Type 2 diabetes mellitus with other specified complication: Secondary | ICD-10-CM | POA: Diagnosis not present

## 2021-01-02 DIAGNOSIS — M6281 Muscle weakness (generalized): Secondary | ICD-10-CM | POA: Diagnosis not present

## 2021-01-02 DIAGNOSIS — R2689 Other abnormalities of gait and mobility: Secondary | ICD-10-CM | POA: Diagnosis not present

## 2021-01-02 DIAGNOSIS — I110 Hypertensive heart disease with heart failure: Secondary | ICD-10-CM | POA: Diagnosis not present

## 2021-01-03 DIAGNOSIS — R2689 Other abnormalities of gait and mobility: Secondary | ICD-10-CM | POA: Diagnosis not present

## 2021-01-03 DIAGNOSIS — E119 Type 2 diabetes mellitus without complications: Secondary | ICD-10-CM | POA: Diagnosis not present

## 2021-01-03 DIAGNOSIS — E785 Hyperlipidemia, unspecified: Secondary | ICD-10-CM | POA: Diagnosis not present

## 2021-01-03 DIAGNOSIS — M6281 Muscle weakness (generalized): Secondary | ICD-10-CM | POA: Diagnosis not present

## 2021-01-03 DIAGNOSIS — I110 Hypertensive heart disease with heart failure: Secondary | ICD-10-CM | POA: Diagnosis not present

## 2021-01-03 DIAGNOSIS — E1169 Type 2 diabetes mellitus with other specified complication: Secondary | ICD-10-CM | POA: Diagnosis not present

## 2021-01-04 DIAGNOSIS — E785 Hyperlipidemia, unspecified: Secondary | ICD-10-CM | POA: Diagnosis not present

## 2021-01-04 DIAGNOSIS — R2689 Other abnormalities of gait and mobility: Secondary | ICD-10-CM | POA: Diagnosis not present

## 2021-01-04 DIAGNOSIS — M6281 Muscle weakness (generalized): Secondary | ICD-10-CM | POA: Diagnosis not present

## 2021-01-04 DIAGNOSIS — I110 Hypertensive heart disease with heart failure: Secondary | ICD-10-CM | POA: Diagnosis not present

## 2021-01-04 DIAGNOSIS — E1169 Type 2 diabetes mellitus with other specified complication: Secondary | ICD-10-CM | POA: Diagnosis not present

## 2021-01-05 DIAGNOSIS — I10 Essential (primary) hypertension: Secondary | ICD-10-CM | POA: Diagnosis not present

## 2021-01-05 DIAGNOSIS — E119 Type 2 diabetes mellitus without complications: Secondary | ICD-10-CM | POA: Diagnosis not present

## 2021-01-05 DIAGNOSIS — I110 Hypertensive heart disease with heart failure: Secondary | ICD-10-CM | POA: Diagnosis not present

## 2021-01-05 DIAGNOSIS — R2689 Other abnormalities of gait and mobility: Secondary | ICD-10-CM | POA: Diagnosis not present

## 2021-01-05 DIAGNOSIS — K219 Gastro-esophageal reflux disease without esophagitis: Secondary | ICD-10-CM | POA: Diagnosis not present

## 2021-01-05 DIAGNOSIS — E1169 Type 2 diabetes mellitus with other specified complication: Secondary | ICD-10-CM | POA: Diagnosis not present

## 2021-01-05 DIAGNOSIS — M6281 Muscle weakness (generalized): Secondary | ICD-10-CM | POA: Diagnosis not present

## 2021-01-05 DIAGNOSIS — E785 Hyperlipidemia, unspecified: Secondary | ICD-10-CM | POA: Diagnosis not present

## 2021-01-08 DIAGNOSIS — R2689 Other abnormalities of gait and mobility: Secondary | ICD-10-CM | POA: Diagnosis not present

## 2021-01-08 DIAGNOSIS — E785 Hyperlipidemia, unspecified: Secondary | ICD-10-CM | POA: Diagnosis not present

## 2021-01-08 DIAGNOSIS — I110 Hypertensive heart disease with heart failure: Secondary | ICD-10-CM | POA: Diagnosis not present

## 2021-01-08 DIAGNOSIS — E1169 Type 2 diabetes mellitus with other specified complication: Secondary | ICD-10-CM | POA: Diagnosis not present

## 2021-01-08 DIAGNOSIS — M6281 Muscle weakness (generalized): Secondary | ICD-10-CM | POA: Diagnosis not present

## 2021-01-09 DIAGNOSIS — E785 Hyperlipidemia, unspecified: Secondary | ICD-10-CM | POA: Diagnosis not present

## 2021-01-09 DIAGNOSIS — M6281 Muscle weakness (generalized): Secondary | ICD-10-CM | POA: Diagnosis not present

## 2021-01-09 DIAGNOSIS — I110 Hypertensive heart disease with heart failure: Secondary | ICD-10-CM | POA: Diagnosis not present

## 2021-01-09 DIAGNOSIS — E1169 Type 2 diabetes mellitus with other specified complication: Secondary | ICD-10-CM | POA: Diagnosis not present

## 2021-01-09 DIAGNOSIS — R2689 Other abnormalities of gait and mobility: Secondary | ICD-10-CM | POA: Diagnosis not present

## 2021-01-10 DIAGNOSIS — I110 Hypertensive heart disease with heart failure: Secondary | ICD-10-CM | POA: Diagnosis not present

## 2021-01-10 DIAGNOSIS — E785 Hyperlipidemia, unspecified: Secondary | ICD-10-CM | POA: Diagnosis not present

## 2021-01-10 DIAGNOSIS — M6281 Muscle weakness (generalized): Secondary | ICD-10-CM | POA: Diagnosis not present

## 2021-01-10 DIAGNOSIS — E1169 Type 2 diabetes mellitus with other specified complication: Secondary | ICD-10-CM | POA: Diagnosis not present

## 2021-01-10 DIAGNOSIS — R2689 Other abnormalities of gait and mobility: Secondary | ICD-10-CM | POA: Diagnosis not present

## 2021-01-11 DIAGNOSIS — R2689 Other abnormalities of gait and mobility: Secondary | ICD-10-CM | POA: Diagnosis not present

## 2021-01-11 DIAGNOSIS — I110 Hypertensive heart disease with heart failure: Secondary | ICD-10-CM | POA: Diagnosis not present

## 2021-01-11 DIAGNOSIS — E1169 Type 2 diabetes mellitus with other specified complication: Secondary | ICD-10-CM | POA: Diagnosis not present

## 2021-01-11 DIAGNOSIS — M6281 Muscle weakness (generalized): Secondary | ICD-10-CM | POA: Diagnosis not present

## 2021-01-11 DIAGNOSIS — E785 Hyperlipidemia, unspecified: Secondary | ICD-10-CM | POA: Diagnosis not present

## 2021-01-12 DIAGNOSIS — I110 Hypertensive heart disease with heart failure: Secondary | ICD-10-CM | POA: Diagnosis not present

## 2021-01-12 DIAGNOSIS — E1169 Type 2 diabetes mellitus with other specified complication: Secondary | ICD-10-CM | POA: Diagnosis not present

## 2021-01-12 DIAGNOSIS — K219 Gastro-esophageal reflux disease without esophagitis: Secondary | ICD-10-CM | POA: Diagnosis not present

## 2021-01-12 DIAGNOSIS — M6281 Muscle weakness (generalized): Secondary | ICD-10-CM | POA: Diagnosis not present

## 2021-01-12 DIAGNOSIS — R2689 Other abnormalities of gait and mobility: Secondary | ICD-10-CM | POA: Diagnosis not present

## 2021-01-12 DIAGNOSIS — E785 Hyperlipidemia, unspecified: Secondary | ICD-10-CM | POA: Diagnosis not present

## 2021-01-12 DIAGNOSIS — G4709 Other insomnia: Secondary | ICD-10-CM | POA: Diagnosis not present

## 2021-01-13 DIAGNOSIS — E785 Hyperlipidemia, unspecified: Secondary | ICD-10-CM | POA: Diagnosis not present

## 2021-01-13 DIAGNOSIS — E1169 Type 2 diabetes mellitus with other specified complication: Secondary | ICD-10-CM | POA: Diagnosis not present

## 2021-01-13 DIAGNOSIS — M6281 Muscle weakness (generalized): Secondary | ICD-10-CM | POA: Diagnosis not present

## 2021-01-13 DIAGNOSIS — R2689 Other abnormalities of gait and mobility: Secondary | ICD-10-CM | POA: Diagnosis not present

## 2021-01-13 DIAGNOSIS — I110 Hypertensive heart disease with heart failure: Secondary | ICD-10-CM | POA: Diagnosis not present

## 2021-01-15 DIAGNOSIS — M6281 Muscle weakness (generalized): Secondary | ICD-10-CM | POA: Diagnosis not present

## 2021-01-15 DIAGNOSIS — R2689 Other abnormalities of gait and mobility: Secondary | ICD-10-CM | POA: Diagnosis not present

## 2021-01-15 DIAGNOSIS — E1169 Type 2 diabetes mellitus with other specified complication: Secondary | ICD-10-CM | POA: Diagnosis not present

## 2021-01-15 DIAGNOSIS — I110 Hypertensive heart disease with heart failure: Secondary | ICD-10-CM | POA: Diagnosis not present

## 2021-01-15 DIAGNOSIS — E785 Hyperlipidemia, unspecified: Secondary | ICD-10-CM | POA: Diagnosis not present

## 2021-01-16 DIAGNOSIS — E1169 Type 2 diabetes mellitus with other specified complication: Secondary | ICD-10-CM | POA: Diagnosis not present

## 2021-01-16 DIAGNOSIS — M6281 Muscle weakness (generalized): Secondary | ICD-10-CM | POA: Diagnosis not present

## 2021-01-16 DIAGNOSIS — I110 Hypertensive heart disease with heart failure: Secondary | ICD-10-CM | POA: Diagnosis not present

## 2021-01-16 DIAGNOSIS — R2689 Other abnormalities of gait and mobility: Secondary | ICD-10-CM | POA: Diagnosis not present

## 2021-01-16 DIAGNOSIS — E785 Hyperlipidemia, unspecified: Secondary | ICD-10-CM | POA: Diagnosis not present

## 2021-01-17 DIAGNOSIS — E785 Hyperlipidemia, unspecified: Secondary | ICD-10-CM | POA: Diagnosis not present

## 2021-01-17 DIAGNOSIS — E1122 Type 2 diabetes mellitus with diabetic chronic kidney disease: Secondary | ICD-10-CM | POA: Diagnosis not present

## 2021-01-17 DIAGNOSIS — M6281 Muscle weakness (generalized): Secondary | ICD-10-CM | POA: Diagnosis not present

## 2021-01-17 DIAGNOSIS — S7292XA Unspecified fracture of left femur, initial encounter for closed fracture: Secondary | ICD-10-CM | POA: Diagnosis not present

## 2021-01-17 DIAGNOSIS — E1169 Type 2 diabetes mellitus with other specified complication: Secondary | ICD-10-CM | POA: Diagnosis not present

## 2021-01-17 DIAGNOSIS — I110 Hypertensive heart disease with heart failure: Secondary | ICD-10-CM | POA: Diagnosis not present

## 2021-01-17 DIAGNOSIS — R2689 Other abnormalities of gait and mobility: Secondary | ICD-10-CM | POA: Diagnosis not present

## 2021-01-18 ENCOUNTER — Encounter: Payer: Self-pay | Admitting: Psychiatry

## 2021-01-18 ENCOUNTER — Ambulatory Visit (INDEPENDENT_AMBULATORY_CARE_PROVIDER_SITE_OTHER): Payer: Medicare Other | Admitting: Psychiatry

## 2021-01-18 ENCOUNTER — Other Ambulatory Visit: Payer: Self-pay

## 2021-01-18 DIAGNOSIS — F411 Generalized anxiety disorder: Secondary | ICD-10-CM

## 2021-01-18 DIAGNOSIS — F422 Mixed obsessional thoughts and acts: Secondary | ICD-10-CM

## 2021-01-18 DIAGNOSIS — E785 Hyperlipidemia, unspecified: Secondary | ICD-10-CM | POA: Diagnosis not present

## 2021-01-18 DIAGNOSIS — F5105 Insomnia due to other mental disorder: Secondary | ICD-10-CM

## 2021-01-18 DIAGNOSIS — F200 Paranoid schizophrenia: Secondary | ICD-10-CM | POA: Diagnosis not present

## 2021-01-18 DIAGNOSIS — E1169 Type 2 diabetes mellitus with other specified complication: Secondary | ICD-10-CM | POA: Diagnosis not present

## 2021-01-18 DIAGNOSIS — I110 Hypertensive heart disease with heart failure: Secondary | ICD-10-CM | POA: Diagnosis not present

## 2021-01-18 DIAGNOSIS — F02818 Dementia in other diseases classified elsewhere, unspecified severity, with other behavioral disturbance: Secondary | ICD-10-CM

## 2021-01-18 DIAGNOSIS — M6281 Muscle weakness (generalized): Secondary | ICD-10-CM | POA: Diagnosis not present

## 2021-01-18 DIAGNOSIS — G301 Alzheimer's disease with late onset: Secondary | ICD-10-CM

## 2021-01-18 DIAGNOSIS — R2689 Other abnormalities of gait and mobility: Secondary | ICD-10-CM | POA: Diagnosis not present

## 2021-01-18 NOTE — Progress Notes (Signed)
Ricky [name] [id number] 1940/09/24 80 y.o.     Subjective:   Patient ID:  Ricky [name]. is a 80 y.o. (DOB [birth year]) male.  Chief Complaint:  Chief Complaint  Patient presents with   Follow-up   Hallucinations   Anxiety   Sleeping Problem    Depression        Associated symptoms include decreased concentration and fatigue.  Associated symptoms include no headaches and no suicidal ideas. Ricky [name]. presents to the office today for follow-up of schizophrenia and dementia.  Resident of [facility].  seen April 15, 2019.  No meds were changed.  As of 06/03/2019 the following is noted: Staff reports patient calls very frequently and has for an extended period of time.  He is chronically somewhat needy and lonely as well as has a tendency for reassurance checking. Still bothered by religious thoughts and can't tell if it's from God than or Satan.  Supernatural things get in his mind.  Also thinks that people there don't like him at [facility].  Also upset at Biden's spending excess.  Wants to vote for [name].  Also bothered he doesn't get to do much.    Had thoughts he needed to go to the hospital one night for Lynd reasons but staff encouraged him to wait it out and he felt better the next day. Poor STM. No med changes  9/20/21appt with the following noted: Worries over losing trusted banker.  Worries money is running out.  Worries about getting Covid.  Memory is worse.  Gets obsessive fears about his salvation especially  Listening to [place] radio at times but has chronically worried over it.   Hears voices talking about his mental health and to listen to radio.   Don't have a whole lot of them but enough to get him upset from time to time.. Not markedly depressed. Plan no med changes  02/21/2020 appointment with the following noted: [place] some noise, ring in his head but not voices. Worries about it. They take good care of me over there.  Listens to  radio a lot at night. And doesn't get a lot of sleep at night but will nap daytime. Reads bible but often doesn't understand or remember it. No SE. Calls on [facility] to help him emotionally and spiritually.   LTM good.  More poor STM.    Wonders if it's possible that he'll ever get well.    Watched NFL football yesterday and enjoyed it. Plan: No med changes  07/19/2020 appointment with the following noted: Has had to call couple times after hours bc was ruminating on past issues.  Today feels OK. Recognizes problems with STM but in his heart feels it will get better. Food good at [employer].  People are treating him well except with one man.  Ricky [name] avoids him. Still on olanzapine 25 mg HS and fluovxamine 100, zolpidem 5 HS, Xanax 0.25 mg prn. Can't afford caregiver to take him out daily as in the past. Wonders what doc thinks his IQ would have been.   No concerns about med except wants one to make him well.  01/18/2021 appt noted: Still at [facility].  Feels OK about it.   Still ups and downs but overall holding his own.  Doesn't feel 80 yo.  WC bound but can still move himself around independently.  U incontinence. Getting along pretty well with people, but times thinks people don't like him.  Still wonders if he needs  psych hospitalization. Notices memory problems. Still worries chronically over his salvation but not continuous and anxiety is no worse. Voices are better but still has intermittent AH.   CO trouble staying asleep but at times can sleep too much No SE noted Doing PT to try and get stronger.  Past Psychiatric Medication Trials:  All of them are unknown,  haloperidol alone, Serentil, Stelazine, Moban, perphenazine, risperidone, Thorazine, Seroquel 800 mg, loxapine, been on Zyprexa since September 2006 and that has produced the best control of his paranoia at 25 mg daily. benztropine,    paroxetine, sertraline side effects,   Hydroxyzine, trazodone no response,  Xanax,  zolpidem.  Review of Systems:  Review of Systems  Constitutional:  Positive for fatigue.  HENT:  Positive for tinnitus.   Cardiovascular:  Negative for palpitations.  Genitourinary:  Positive for enuresis.  Musculoskeletal:  Positive for arthralgias, back pain and gait problem.  Neurological:  Positive for dizziness, tremors and weakness. Negative for headaches.       WC bound  Psychiatric/Behavioral:  Positive for confusion, decreased concentration, hallucinations and sleep disturbance. Negative for agitation, self-injury and suicidal ideas. The patient is nervous/anxious. The patient is not hyperactive.    Medications: I have reviewed the patient's current medications.  Current Outpatient Medications  Medication Sig Dispense Refill   ALPRAZolam (XANAX) 0.5 MG tablet Take 1 tablet (0.5 mg total) by mouth at bedtime. (Patient taking differently: Take 0.25 mg by mouth at bedtime.) 2 tablet 0   amLODipine (NORVASC) 2.5 MG tablet      amLODipine (NORVASC) 5 MG tablet Take 5 mg by mouth daily.     Calcium Citrate-Vitamin D (CITRACAL + D PO) Take 1 tablet by mouth daily.     Cholecalciferol (VITAMIN D-3) 1000 units CAPS Take 1 capsule by mouth daily.     docusate sodium (COLACE) 100 MG capsule Take 100 mg by mouth 2 (two) times daily.     empagliflozin (JARDIANCE) 10 MG TABS tablet Take 10 mg by mouth daily. 2 tablet 0   fluvoxaMINE (LUVOX) 100 MG tablet Take 1 tablet (100 mg total) by mouth at bedtime. 2 tablet 0   furosemide (LASIX) 20 MG tablet Take 20 mg by mouth daily before breakfast.      HUMALOG KWIKPEN 100 UNIT/ML KiwkPen Inject 2-11 Units into the skin See admin instructions. Times: 0630 1130 1630  Scale: 101-150: 2 units 151-200: 3 units 201-250: 5 units 251-300: 7 units 301-350: 9 units >350: 11 units     hydrOXYzine (VISTARIL) 25 MG capsule      insulin detemir (LEVEMIR) 100 UNIT/ML injection Inject 0.3 mLs (30 Units total) into the skin at bedtime. (Patient taking  differently: Inject 24 Units into the skin at bedtime.) 10 mL 11   insulin glargine (LANTUS) 100 UNIT/ML injection 25 u     insulin regular (NOVOLIN R) 100 units/mL injection ss     meloxicam (MOBIC) 15 MG tablet 1 tablet     Menthol, Topical Analgesic, (BIOFREEZE) 4 % GEL Apply topically.     metFORMIN (GLUCOPHAGE) 500 MG tablet 1 tablet with meals     Multiple Vitamins-Minerals (THERAGRAN-M PREMIER 50 PLUS PO) Take 1 tablet by mouth daily.     MYRBETRIQ 25 MG TB24 tablet Take 25 mg by mouth daily.      OLANZapine (ZYPREXA) 5 MG tablet Take 5 tablets (25 mg total) by mouth daily. 2 tablet 0   Omega-3 Fatty Acids (FISH OIL) 1200 MG CAPS  omeprazole (PRILOSEC) 40 MG capsule Take 40 mg by mouth daily.      polyethylene glycol powder (GLYCOLAX/MIRALAX) 17 GM/SCOOP powder Take 1 Container by mouth once.     pravastatin (PRAVACHOL) 40 MG tablet Take 40 mg by mouth daily after lunch.      tamsulosin (FLOMAX) 0.4 MG CAPS capsule Take 0.4 mg by mouth daily.      traMADol (ULTRAM) 50 MG tablet Take 1 tablet (50 mg total) by mouth every 6 (six) hours as needed. 20 tablet 0   zolpidem (AMBIEN) 5 MG tablet Take 1 tablet (5 mg total) by mouth at bedtime. 2 tablet 0   No current facility-administered medications for this visit.    Medication Side Effects: Other: dry mouth  Allergies:  Allergies  Allergen Reactions   Asa [Aspirin]     "Dr told me not to take it"   Codeine Other (See Comments)    DIZZINESS with Tylenol 3   Tylenol With Codeine #3 [Acetaminophen-Codeine]     Other reaction(s): Unknown   Tylenol [Acetaminophen] Other (See Comments)    Dizziness with tylenol 3    Past Medical History:  Diagnosis Date   Abnormality of gait    Adenomatous colon polyp    Arthritis    Cataract    Dementia ([place])    Depression    Diabetes mellitus without complication ([place])    Fatty liver    Hyperlipidemia    Hypertension    Internal hemorrhoids    Other malaise and fatigue    Peripheral  edema    Schizophrenia ([place])    Type II or unspecified type diabetes mellitus without mention of complication, not stated as uncontrolled    Urinary frequency    Urinary retention     Family History  Problem Relation Age of Onset   Brain cancer Father     Social History   Socioeconomic History   Marital status: Single    Spouse name: Not on file   Number of children: Not on file   Years of education: college   Highest education level: Not on file  Occupational History    Employer: RETIRED    Comment: Disabled  Tobacco Use   Smoking status: Never   Smokeless tobacco: Never  Substance and Sexual Activity   Alcohol use: No   Drug use: No   Sexual activity: Not on file  Other Topics Concern   Not on file  Social History Narrative   Patient is disabled and he has not worked since 1962. Patient has some college education.Patient drinks 2 cups of coffee daily.   Right handed.   Social Determinants of Health   Financial Resource Strain: Not on file  Food Insecurity: Not on file  Transportation Needs: Not on file  Physical Activity: Not on file  Stress: Not on file  Social Connections: Not on file  Intimate Partner Violence: Not on file    Past Medical History, Surgical history, Social history, and Family history were reviewed and updated as appropriate.   Please see review of systems for further details on the patient's review from today.   Objective:   Physical Exam:  There were no vitals taken for this visit.  Physical Exam Constitutional:      Appearance: He is obese.  Neurological:     Mental Status: He is alert and oriented to person, place, and time.     Cranial Nerves: Dysarthria present.     Motor: Weakness present.  Gait: Gait abnormal.     Comments: Mild dysarthria chronic   Psychiatric:        Attention and Perception: Attention normal. He is attentive. He perceives auditory hallucinations. He does not perceive visual hallucinations.         Mood and Affect: Mood is anxious. Mood is not depressed. Affect is not angry or tearful.        Speech: Speech normal. Speech is not rapid and pressured or slurred.        Behavior: Behavior is slowed. Behavior is not agitated or aggressive. Behavior is cooperative.        Thought Content: Thought content is paranoid and delusional. Thought content does not include homicidal or suicidal ideation. Thought content does not include homicidal or suicidal plan.        Cognition and Memory: Cognition is impaired. Memory is impaired. He does not exhibit impaired recent memory.     Comments: Chronic voices some better. Fair insight and judgment. Talkative and repeats himself some. Memory is stable.Ricky [name] words at times Reassurance seeking chronically. Some IOR re commericials on TV ongoing not disturbing. Obsessive spiritual thought and compulsive behaviors are prominent but manageable.  Less paranoid than usual. AH is just a noise not voice when he does something he feels guilty about. Affect pretty calm.  He agrees    Lab Review:     Component Value Date/Time   NA 139 01/22/2018 0339   K 3.6 01/22/2018 0339   CL 100 01/22/2018 0339   CO2 28 01/22/2018 0339   GLUCOSE 231 (H) 01/22/2018 0339   BUN 15 01/22/2018 0339   CREATININE 0.96 01/22/2018 0339   CALCIUM 9.2 01/22/2018 0339   PROT 7.0 01/22/2018 0339   ALBUMIN 3.3 (L) 01/22/2018 0339   AST 35 01/22/2018 0339   ALT 38 01/22/2018 0339   ALKPHOS 71 01/22/2018 0339   BILITOT 0.6 01/22/2018 0339   GFRNONAA >60 01/22/2018 0339   GFRAA >60 01/22/2018 0339       Component Value Date/Time   WBC 6.8 01/22/2018 0339   RBC 4.48 01/22/2018 0339   HGB 12.9 (L) 01/22/2018 0339   HCT 41.7 01/22/2018 0339   PLT 211 01/22/2018 0339   MCV 93.1 01/22/2018 0339   MCH 28.8 01/22/2018 0339   MCHC 30.9 01/22/2018 0339   RDW 14.0 01/22/2018 0339   LYMPHSABS 1.8 01/22/2018 0339   MONOABS 0.6 01/22/2018 0339   EOSABS 0.3 01/22/2018 0339    BASOSABS 0.1 01/22/2018 0339    No results found for: POCLITH, LITHIUM   No results found for: PHENYTOIN, PHENOBARB, VALPROATE, CBMZ   .res Assessment: Plan:    Ricky [name] was seen today for follow-up, hallucinations, anxiety and sleeping problem.  Diagnoses and all orders for this visit:  Schizophrenia, paranoid ([place])  Mixed obsessional thoughts and acts  Generalized anxiety disorder  Late onset Alzheimer's disease with behavioral disturbance ([place])  Insomnia due to mental condition   Greater than 50% of 30 min face to face time with patient was spent on counseling and coordination of care. We discussed several topics as noted: Reviewed documents today from [facility] including med list. Med reconciliation.  Patient has been under my psychiatric years since 1998 .unlikely that med change will reduce the paranoia and auditory hallucinations which are chronic. Voices are better and paranoia appears a little better but varies.  Reports still cooperative with staff.  He has been on multiple psychiatric medications and has done best on this combination of  Zyprexa which was increased to 25 mg April 09, 2018, a minimal dose of alprazolam 0.25 mg HS, and fluvoxamine 100 mg daily. Is cooperative genereally.   He also still has obsessive and intrusive fears about losing his salvation but they are chronic and have not been easily managed.  Given his age and the chronicity of the symptoms med changes are unlikely to help.  Overall it appears a little better at present. Overall also paranoia is better than other times.  Needs high dose olanzapine.  Supportive therapy dealing with chronic paranoia and isolation from Covid.  Encourage his spirituality as a coping mechanism.  Needs a lot of encouragement.  Repetitively asks for reassurance. Needs reassurance he does not need to go to the hospital. He is making fewer after hours phone calls to our office than historically has been done.  Weaker  and needing more physical assistance.  WC bound    Discussed potential metabolic side effects associated with atypical antipsychotics, as well as potential risk for movement side effects. Advised pt to contact office if movement side effects occur.   Disc he's taking more than the usuall highest dose of olanzapine but is medically necessary for paranoia. No AIM.  No med changes today. Successfully reduced alprazolam to 0.25 mg HS Continue olanzapine 25 mg HS for psychosis Continue fluvoxamine 100 mg HS for OCD and anxiety Continue zolpidem 5 mg HS for sleep Consider stopping alprazolam 0.25 mg HS as long as he's sleeping well. Or reducing to 0.125 mg HS.  Dose is already low  25 min appt  FU 4-6 mos  Ricky Parents, MD, DFAPA .    Future Appointments  Date Time Provider [facility]  02/09/2021 10:15 AM Rex Kras, Arthur Holms [facility]  02/28/2021  8:45 AM Gardiner Barefoot, DPM [facility]    No orders of the defined types were placed in this encounter.      -------------------------------

## 2021-01-20 DIAGNOSIS — K219 Gastro-esophageal reflux disease without esophagitis: Secondary | ICD-10-CM | POA: Diagnosis not present

## 2021-01-20 DIAGNOSIS — I10 Essential (primary) hypertension: Secondary | ICD-10-CM | POA: Diagnosis not present

## 2021-01-20 DIAGNOSIS — K5909 Other constipation: Secondary | ICD-10-CM | POA: Diagnosis not present

## 2021-01-21 DIAGNOSIS — E1169 Type 2 diabetes mellitus with other specified complication: Secondary | ICD-10-CM | POA: Diagnosis not present

## 2021-01-21 DIAGNOSIS — E785 Hyperlipidemia, unspecified: Secondary | ICD-10-CM | POA: Diagnosis not present

## 2021-01-21 DIAGNOSIS — M6281 Muscle weakness (generalized): Secondary | ICD-10-CM | POA: Diagnosis not present

## 2021-01-21 DIAGNOSIS — R2689 Other abnormalities of gait and mobility: Secondary | ICD-10-CM | POA: Diagnosis not present

## 2021-01-21 DIAGNOSIS — I110 Hypertensive heart disease with heart failure: Secondary | ICD-10-CM | POA: Diagnosis not present

## 2021-01-22 DIAGNOSIS — M6281 Muscle weakness (generalized): Secondary | ICD-10-CM | POA: Diagnosis not present

## 2021-01-22 DIAGNOSIS — I110 Hypertensive heart disease with heart failure: Secondary | ICD-10-CM | POA: Diagnosis not present

## 2021-01-22 DIAGNOSIS — R2689 Other abnormalities of gait and mobility: Secondary | ICD-10-CM | POA: Diagnosis not present

## 2021-01-22 DIAGNOSIS — E1169 Type 2 diabetes mellitus with other specified complication: Secondary | ICD-10-CM | POA: Diagnosis not present

## 2021-01-22 DIAGNOSIS — E785 Hyperlipidemia, unspecified: Secondary | ICD-10-CM | POA: Diagnosis not present

## 2021-01-23 DIAGNOSIS — R2689 Other abnormalities of gait and mobility: Secondary | ICD-10-CM | POA: Diagnosis not present

## 2021-01-23 DIAGNOSIS — E1169 Type 2 diabetes mellitus with other specified complication: Secondary | ICD-10-CM | POA: Diagnosis not present

## 2021-01-23 DIAGNOSIS — I110 Hypertensive heart disease with heart failure: Secondary | ICD-10-CM | POA: Diagnosis not present

## 2021-01-23 DIAGNOSIS — M6281 Muscle weakness (generalized): Secondary | ICD-10-CM | POA: Diagnosis not present

## 2021-01-23 DIAGNOSIS — E785 Hyperlipidemia, unspecified: Secondary | ICD-10-CM | POA: Diagnosis not present

## 2021-01-24 DIAGNOSIS — E119 Type 2 diabetes mellitus without complications: Secondary | ICD-10-CM | POA: Diagnosis not present

## 2021-01-24 DIAGNOSIS — I110 Hypertensive heart disease with heart failure: Secondary | ICD-10-CM | POA: Diagnosis not present

## 2021-01-24 DIAGNOSIS — M6281 Muscle weakness (generalized): Secondary | ICD-10-CM | POA: Diagnosis not present

## 2021-01-24 DIAGNOSIS — E785 Hyperlipidemia, unspecified: Secondary | ICD-10-CM | POA: Diagnosis not present

## 2021-01-24 DIAGNOSIS — R2689 Other abnormalities of gait and mobility: Secondary | ICD-10-CM | POA: Diagnosis not present

## 2021-01-24 DIAGNOSIS — G4709 Other insomnia: Secondary | ICD-10-CM | POA: Diagnosis not present

## 2021-01-24 DIAGNOSIS — I10 Essential (primary) hypertension: Secondary | ICD-10-CM | POA: Diagnosis not present

## 2021-01-24 DIAGNOSIS — E1169 Type 2 diabetes mellitus with other specified complication: Secondary | ICD-10-CM | POA: Diagnosis not present

## 2021-01-25 DIAGNOSIS — I110 Hypertensive heart disease with heart failure: Secondary | ICD-10-CM | POA: Diagnosis not present

## 2021-01-25 DIAGNOSIS — E785 Hyperlipidemia, unspecified: Secondary | ICD-10-CM | POA: Diagnosis not present

## 2021-01-25 DIAGNOSIS — E1169 Type 2 diabetes mellitus with other specified complication: Secondary | ICD-10-CM | POA: Diagnosis not present

## 2021-01-25 DIAGNOSIS — R2689 Other abnormalities of gait and mobility: Secondary | ICD-10-CM | POA: Diagnosis not present

## 2021-01-25 DIAGNOSIS — H40013 Open angle with borderline findings, low risk, bilateral: Secondary | ICD-10-CM | POA: Diagnosis not present

## 2021-01-25 DIAGNOSIS — M6281 Muscle weakness (generalized): Secondary | ICD-10-CM | POA: Diagnosis not present

## 2021-01-25 DIAGNOSIS — H538 Other visual disturbances: Secondary | ICD-10-CM | POA: Diagnosis not present

## 2021-01-26 DIAGNOSIS — E1169 Type 2 diabetes mellitus with other specified complication: Secondary | ICD-10-CM | POA: Diagnosis not present

## 2021-01-26 DIAGNOSIS — E785 Hyperlipidemia, unspecified: Secondary | ICD-10-CM | POA: Diagnosis not present

## 2021-01-26 DIAGNOSIS — I110 Hypertensive heart disease with heart failure: Secondary | ICD-10-CM | POA: Diagnosis not present

## 2021-01-26 DIAGNOSIS — R2689 Other abnormalities of gait and mobility: Secondary | ICD-10-CM | POA: Diagnosis not present

## 2021-01-26 DIAGNOSIS — M6281 Muscle weakness (generalized): Secondary | ICD-10-CM | POA: Diagnosis not present

## 2021-01-27 DIAGNOSIS — M6281 Muscle weakness (generalized): Secondary | ICD-10-CM | POA: Diagnosis not present

## 2021-01-27 DIAGNOSIS — E1169 Type 2 diabetes mellitus with other specified complication: Secondary | ICD-10-CM | POA: Diagnosis not present

## 2021-01-27 DIAGNOSIS — E785 Hyperlipidemia, unspecified: Secondary | ICD-10-CM | POA: Diagnosis not present

## 2021-01-27 DIAGNOSIS — R2689 Other abnormalities of gait and mobility: Secondary | ICD-10-CM | POA: Diagnosis not present

## 2021-01-27 DIAGNOSIS — I110 Hypertensive heart disease with heart failure: Secondary | ICD-10-CM | POA: Diagnosis not present

## 2021-01-29 DIAGNOSIS — R2689 Other abnormalities of gait and mobility: Secondary | ICD-10-CM | POA: Diagnosis not present

## 2021-01-29 DIAGNOSIS — E1169 Type 2 diabetes mellitus with other specified complication: Secondary | ICD-10-CM | POA: Diagnosis not present

## 2021-01-29 DIAGNOSIS — I110 Hypertensive heart disease with heart failure: Secondary | ICD-10-CM | POA: Diagnosis not present

## 2021-01-29 DIAGNOSIS — M6281 Muscle weakness (generalized): Secondary | ICD-10-CM | POA: Diagnosis not present

## 2021-01-29 DIAGNOSIS — E785 Hyperlipidemia, unspecified: Secondary | ICD-10-CM | POA: Diagnosis not present

## 2021-01-30 DIAGNOSIS — E1169 Type 2 diabetes mellitus with other specified complication: Secondary | ICD-10-CM | POA: Diagnosis not present

## 2021-01-30 DIAGNOSIS — I110 Hypertensive heart disease with heart failure: Secondary | ICD-10-CM | POA: Diagnosis not present

## 2021-01-30 DIAGNOSIS — M6281 Muscle weakness (generalized): Secondary | ICD-10-CM | POA: Diagnosis not present

## 2021-01-30 DIAGNOSIS — R2689 Other abnormalities of gait and mobility: Secondary | ICD-10-CM | POA: Diagnosis not present

## 2021-01-30 DIAGNOSIS — E785 Hyperlipidemia, unspecified: Secondary | ICD-10-CM | POA: Diagnosis not present

## 2021-01-31 DIAGNOSIS — M6281 Muscle weakness (generalized): Secondary | ICD-10-CM | POA: Diagnosis not present

## 2021-01-31 DIAGNOSIS — E1169 Type 2 diabetes mellitus with other specified complication: Secondary | ICD-10-CM | POA: Diagnosis not present

## 2021-01-31 DIAGNOSIS — E785 Hyperlipidemia, unspecified: Secondary | ICD-10-CM | POA: Diagnosis not present

## 2021-01-31 DIAGNOSIS — I110 Hypertensive heart disease with heart failure: Secondary | ICD-10-CM | POA: Diagnosis not present

## 2021-01-31 DIAGNOSIS — R2689 Other abnormalities of gait and mobility: Secondary | ICD-10-CM | POA: Diagnosis not present

## 2021-02-01 DIAGNOSIS — E785 Hyperlipidemia, unspecified: Secondary | ICD-10-CM | POA: Diagnosis not present

## 2021-02-01 DIAGNOSIS — E1169 Type 2 diabetes mellitus with other specified complication: Secondary | ICD-10-CM | POA: Diagnosis not present

## 2021-02-01 DIAGNOSIS — I110 Hypertensive heart disease with heart failure: Secondary | ICD-10-CM | POA: Diagnosis not present

## 2021-02-01 DIAGNOSIS — R2689 Other abnormalities of gait and mobility: Secondary | ICD-10-CM | POA: Diagnosis not present

## 2021-02-01 DIAGNOSIS — M6281 Muscle weakness (generalized): Secondary | ICD-10-CM | POA: Diagnosis not present

## 2021-02-03 DIAGNOSIS — I110 Hypertensive heart disease with heart failure: Secondary | ICD-10-CM | POA: Diagnosis not present

## 2021-02-03 DIAGNOSIS — E1169 Type 2 diabetes mellitus with other specified complication: Secondary | ICD-10-CM | POA: Diagnosis not present

## 2021-02-03 DIAGNOSIS — R2689 Other abnormalities of gait and mobility: Secondary | ICD-10-CM | POA: Diagnosis not present

## 2021-02-03 DIAGNOSIS — M6281 Muscle weakness (generalized): Secondary | ICD-10-CM | POA: Diagnosis not present

## 2021-02-03 DIAGNOSIS — E785 Hyperlipidemia, unspecified: Secondary | ICD-10-CM | POA: Diagnosis not present

## 2021-02-04 DIAGNOSIS — R2689 Other abnormalities of gait and mobility: Secondary | ICD-10-CM | POA: Diagnosis not present

## 2021-02-04 DIAGNOSIS — M6281 Muscle weakness (generalized): Secondary | ICD-10-CM | POA: Diagnosis not present

## 2021-02-04 DIAGNOSIS — E785 Hyperlipidemia, unspecified: Secondary | ICD-10-CM | POA: Diagnosis not present

## 2021-02-04 DIAGNOSIS — I110 Hypertensive heart disease with heart failure: Secondary | ICD-10-CM | POA: Diagnosis not present

## 2021-02-04 DIAGNOSIS — E1169 Type 2 diabetes mellitus with other specified complication: Secondary | ICD-10-CM | POA: Diagnosis not present

## 2021-02-05 DIAGNOSIS — I10 Essential (primary) hypertension: Secondary | ICD-10-CM | POA: Diagnosis not present

## 2021-02-05 DIAGNOSIS — E785 Hyperlipidemia, unspecified: Secondary | ICD-10-CM | POA: Diagnosis not present

## 2021-02-05 DIAGNOSIS — E119 Type 2 diabetes mellitus without complications: Secondary | ICD-10-CM | POA: Diagnosis not present

## 2021-02-06 DIAGNOSIS — I110 Hypertensive heart disease with heart failure: Secondary | ICD-10-CM | POA: Diagnosis not present

## 2021-02-06 DIAGNOSIS — E785 Hyperlipidemia, unspecified: Secondary | ICD-10-CM | POA: Diagnosis not present

## 2021-02-06 DIAGNOSIS — E1169 Type 2 diabetes mellitus with other specified complication: Secondary | ICD-10-CM | POA: Diagnosis not present

## 2021-02-06 DIAGNOSIS — R2689 Other abnormalities of gait and mobility: Secondary | ICD-10-CM | POA: Diagnosis not present

## 2021-02-06 DIAGNOSIS — M6281 Muscle weakness (generalized): Secondary | ICD-10-CM | POA: Diagnosis not present

## 2021-02-07 DIAGNOSIS — R2689 Other abnormalities of gait and mobility: Secondary | ICD-10-CM | POA: Diagnosis not present

## 2021-02-07 DIAGNOSIS — M6281 Muscle weakness (generalized): Secondary | ICD-10-CM | POA: Diagnosis not present

## 2021-02-07 DIAGNOSIS — I110 Hypertensive heart disease with heart failure: Secondary | ICD-10-CM | POA: Diagnosis not present

## 2021-02-07 DIAGNOSIS — E785 Hyperlipidemia, unspecified: Secondary | ICD-10-CM | POA: Diagnosis not present

## 2021-02-07 DIAGNOSIS — E1169 Type 2 diabetes mellitus with other specified complication: Secondary | ICD-10-CM | POA: Diagnosis not present

## 2021-02-08 DIAGNOSIS — E1169 Type 2 diabetes mellitus with other specified complication: Secondary | ICD-10-CM | POA: Diagnosis not present

## 2021-02-08 DIAGNOSIS — M6281 Muscle weakness (generalized): Secondary | ICD-10-CM | POA: Diagnosis not present

## 2021-02-08 DIAGNOSIS — E785 Hyperlipidemia, unspecified: Secondary | ICD-10-CM | POA: Diagnosis not present

## 2021-02-08 DIAGNOSIS — I110 Hypertensive heart disease with heart failure: Secondary | ICD-10-CM | POA: Diagnosis not present

## 2021-02-08 DIAGNOSIS — R2689 Other abnormalities of gait and mobility: Secondary | ICD-10-CM | POA: Diagnosis not present

## 2021-02-09 ENCOUNTER — Other Ambulatory Visit: Payer: Self-pay

## 2021-02-09 ENCOUNTER — Ambulatory Visit: Payer: Medicare Other

## 2021-02-09 DIAGNOSIS — M6281 Muscle weakness (generalized): Secondary | ICD-10-CM | POA: Diagnosis not present

## 2021-02-09 DIAGNOSIS — E1159 Type 2 diabetes mellitus with other circulatory complications: Secondary | ICD-10-CM

## 2021-02-09 DIAGNOSIS — E1169 Type 2 diabetes mellitus with other specified complication: Secondary | ICD-10-CM | POA: Diagnosis not present

## 2021-02-09 DIAGNOSIS — I110 Hypertensive heart disease with heart failure: Secondary | ICD-10-CM | POA: Diagnosis not present

## 2021-02-09 DIAGNOSIS — M2041 Other hammer toe(s) (acquired), right foot: Secondary | ICD-10-CM

## 2021-02-09 DIAGNOSIS — R2689 Other abnormalities of gait and mobility: Secondary | ICD-10-CM | POA: Diagnosis not present

## 2021-02-09 DIAGNOSIS — E785 Hyperlipidemia, unspecified: Secondary | ICD-10-CM | POA: Diagnosis not present

## 2021-02-09 NOTE — Progress Notes (Signed)
Discussed financial obligation with patient and patient does not wish to proceed at this time. Plan of care to resume at patient's discretion. All questions answered and concerns addressed.

## 2021-02-10 DIAGNOSIS — M6281 Muscle weakness (generalized): Secondary | ICD-10-CM | POA: Diagnosis not present

## 2021-02-10 DIAGNOSIS — K219 Gastro-esophageal reflux disease without esophagitis: Secondary | ICD-10-CM | POA: Diagnosis not present

## 2021-02-10 DIAGNOSIS — E785 Hyperlipidemia, unspecified: Secondary | ICD-10-CM | POA: Diagnosis not present

## 2021-02-10 DIAGNOSIS — R2689 Other abnormalities of gait and mobility: Secondary | ICD-10-CM | POA: Diagnosis not present

## 2021-02-10 DIAGNOSIS — I110 Hypertensive heart disease with heart failure: Secondary | ICD-10-CM | POA: Diagnosis not present

## 2021-02-10 DIAGNOSIS — E1169 Type 2 diabetes mellitus with other specified complication: Secondary | ICD-10-CM | POA: Diagnosis not present

## 2021-02-10 DIAGNOSIS — I10 Essential (primary) hypertension: Secondary | ICD-10-CM | POA: Diagnosis not present

## 2021-02-12 DIAGNOSIS — E785 Hyperlipidemia, unspecified: Secondary | ICD-10-CM | POA: Diagnosis not present

## 2021-02-12 DIAGNOSIS — E1169 Type 2 diabetes mellitus with other specified complication: Secondary | ICD-10-CM | POA: Diagnosis not present

## 2021-02-12 DIAGNOSIS — R2689 Other abnormalities of gait and mobility: Secondary | ICD-10-CM | POA: Diagnosis not present

## 2021-02-12 DIAGNOSIS — M6281 Muscle weakness (generalized): Secondary | ICD-10-CM | POA: Diagnosis not present

## 2021-02-12 DIAGNOSIS — I110 Hypertensive heart disease with heart failure: Secondary | ICD-10-CM | POA: Diagnosis not present

## 2021-02-13 DIAGNOSIS — E785 Hyperlipidemia, unspecified: Secondary | ICD-10-CM | POA: Diagnosis not present

## 2021-02-13 DIAGNOSIS — R2689 Other abnormalities of gait and mobility: Secondary | ICD-10-CM | POA: Diagnosis not present

## 2021-02-13 DIAGNOSIS — E1169 Type 2 diabetes mellitus with other specified complication: Secondary | ICD-10-CM | POA: Diagnosis not present

## 2021-02-13 DIAGNOSIS — M6281 Muscle weakness (generalized): Secondary | ICD-10-CM | POA: Diagnosis not present

## 2021-02-13 DIAGNOSIS — I110 Hypertensive heart disease with heart failure: Secondary | ICD-10-CM | POA: Diagnosis not present

## 2021-02-14 DIAGNOSIS — E1169 Type 2 diabetes mellitus with other specified complication: Secondary | ICD-10-CM | POA: Diagnosis not present

## 2021-02-14 DIAGNOSIS — I110 Hypertensive heart disease with heart failure: Secondary | ICD-10-CM | POA: Diagnosis not present

## 2021-02-14 DIAGNOSIS — E785 Hyperlipidemia, unspecified: Secondary | ICD-10-CM | POA: Diagnosis not present

## 2021-02-14 DIAGNOSIS — M6281 Muscle weakness (generalized): Secondary | ICD-10-CM | POA: Diagnosis not present

## 2021-02-14 DIAGNOSIS — R2689 Other abnormalities of gait and mobility: Secondary | ICD-10-CM | POA: Diagnosis not present

## 2021-02-15 DIAGNOSIS — K219 Gastro-esophageal reflux disease without esophagitis: Secondary | ICD-10-CM | POA: Diagnosis not present

## 2021-02-15 DIAGNOSIS — R2689 Other abnormalities of gait and mobility: Secondary | ICD-10-CM | POA: Diagnosis not present

## 2021-02-15 DIAGNOSIS — E119 Type 2 diabetes mellitus without complications: Secondary | ICD-10-CM | POA: Diagnosis not present

## 2021-02-15 DIAGNOSIS — I110 Hypertensive heart disease with heart failure: Secondary | ICD-10-CM | POA: Diagnosis not present

## 2021-02-15 DIAGNOSIS — E785 Hyperlipidemia, unspecified: Secondary | ICD-10-CM | POA: Diagnosis not present

## 2021-02-15 DIAGNOSIS — I10 Essential (primary) hypertension: Secondary | ICD-10-CM | POA: Diagnosis not present

## 2021-02-15 DIAGNOSIS — E1169 Type 2 diabetes mellitus with other specified complication: Secondary | ICD-10-CM | POA: Diagnosis not present

## 2021-02-15 DIAGNOSIS — M6281 Muscle weakness (generalized): Secondary | ICD-10-CM | POA: Diagnosis not present

## 2021-02-16 DIAGNOSIS — E1169 Type 2 diabetes mellitus with other specified complication: Secondary | ICD-10-CM | POA: Diagnosis not present

## 2021-02-16 DIAGNOSIS — M6281 Muscle weakness (generalized): Secondary | ICD-10-CM | POA: Diagnosis not present

## 2021-02-16 DIAGNOSIS — E785 Hyperlipidemia, unspecified: Secondary | ICD-10-CM | POA: Diagnosis not present

## 2021-02-16 DIAGNOSIS — R2689 Other abnormalities of gait and mobility: Secondary | ICD-10-CM | POA: Diagnosis not present

## 2021-02-16 DIAGNOSIS — I110 Hypertensive heart disease with heart failure: Secondary | ICD-10-CM | POA: Diagnosis not present

## 2021-02-19 DIAGNOSIS — E785 Hyperlipidemia, unspecified: Secondary | ICD-10-CM | POA: Diagnosis not present

## 2021-02-19 DIAGNOSIS — R2689 Other abnormalities of gait and mobility: Secondary | ICD-10-CM | POA: Diagnosis not present

## 2021-02-19 DIAGNOSIS — I110 Hypertensive heart disease with heart failure: Secondary | ICD-10-CM | POA: Diagnosis not present

## 2021-02-19 DIAGNOSIS — E1169 Type 2 diabetes mellitus with other specified complication: Secondary | ICD-10-CM | POA: Diagnosis not present

## 2021-02-19 DIAGNOSIS — M6281 Muscle weakness (generalized): Secondary | ICD-10-CM | POA: Diagnosis not present

## 2021-02-20 DIAGNOSIS — I110 Hypertensive heart disease with heart failure: Secondary | ICD-10-CM | POA: Diagnosis not present

## 2021-02-20 DIAGNOSIS — R2689 Other abnormalities of gait and mobility: Secondary | ICD-10-CM | POA: Diagnosis not present

## 2021-02-20 DIAGNOSIS — M6281 Muscle weakness (generalized): Secondary | ICD-10-CM | POA: Diagnosis not present

## 2021-02-20 DIAGNOSIS — E785 Hyperlipidemia, unspecified: Secondary | ICD-10-CM | POA: Diagnosis not present

## 2021-02-20 DIAGNOSIS — E1169 Type 2 diabetes mellitus with other specified complication: Secondary | ICD-10-CM | POA: Diagnosis not present

## 2021-02-21 ENCOUNTER — Telehealth: Payer: Self-pay

## 2021-02-21 DIAGNOSIS — E1169 Type 2 diabetes mellitus with other specified complication: Secondary | ICD-10-CM | POA: Diagnosis not present

## 2021-02-21 DIAGNOSIS — R2689 Other abnormalities of gait and mobility: Secondary | ICD-10-CM | POA: Diagnosis not present

## 2021-02-21 DIAGNOSIS — E785 Hyperlipidemia, unspecified: Secondary | ICD-10-CM | POA: Diagnosis not present

## 2021-02-21 DIAGNOSIS — I110 Hypertensive heart disease with heart failure: Secondary | ICD-10-CM | POA: Diagnosis not present

## 2021-02-21 DIAGNOSIS — M6281 Muscle weakness (generalized): Secondary | ICD-10-CM | POA: Diagnosis not present

## 2021-02-21 NOTE — Telephone Encounter (Signed)
Discussed with patient's medical power of attorney purpose and function of diabetic shoes and insoles, financial obligation inasmuch as patient qualifies due to diabetes mellitus and foot condition, purpose and function of the Certificate of Medical Necessity, and the fact that there is the possibility of an outstanding balance. They will determine how they want to proceed regarding shoe ordering and insert fabrication at a later date and contact us. All questions answered and concerns addressed. Caregiver is satisfied with and agrees upon plan of care.

## 2021-02-22 DIAGNOSIS — I10 Essential (primary) hypertension: Secondary | ICD-10-CM | POA: Diagnosis not present

## 2021-02-22 DIAGNOSIS — E119 Type 2 diabetes mellitus without complications: Secondary | ICD-10-CM | POA: Diagnosis not present

## 2021-02-22 DIAGNOSIS — R2689 Other abnormalities of gait and mobility: Secondary | ICD-10-CM | POA: Diagnosis not present

## 2021-02-22 DIAGNOSIS — E785 Hyperlipidemia, unspecified: Secondary | ICD-10-CM | POA: Diagnosis not present

## 2021-02-22 DIAGNOSIS — I110 Hypertensive heart disease with heart failure: Secondary | ICD-10-CM | POA: Diagnosis not present

## 2021-02-22 DIAGNOSIS — E1169 Type 2 diabetes mellitus with other specified complication: Secondary | ICD-10-CM | POA: Diagnosis not present

## 2021-02-22 DIAGNOSIS — M6281 Muscle weakness (generalized): Secondary | ICD-10-CM | POA: Diagnosis not present

## 2021-02-23 DIAGNOSIS — M6281 Muscle weakness (generalized): Secondary | ICD-10-CM | POA: Diagnosis not present

## 2021-02-23 DIAGNOSIS — I110 Hypertensive heart disease with heart failure: Secondary | ICD-10-CM | POA: Diagnosis not present

## 2021-02-23 DIAGNOSIS — R2689 Other abnormalities of gait and mobility: Secondary | ICD-10-CM | POA: Diagnosis not present

## 2021-02-23 DIAGNOSIS — E785 Hyperlipidemia, unspecified: Secondary | ICD-10-CM | POA: Diagnosis not present

## 2021-02-23 DIAGNOSIS — E1169 Type 2 diabetes mellitus with other specified complication: Secondary | ICD-10-CM | POA: Diagnosis not present

## 2021-02-24 DIAGNOSIS — N390 Urinary tract infection, site not specified: Secondary | ICD-10-CM | POA: Diagnosis not present

## 2021-02-25 DIAGNOSIS — K5909 Other constipation: Secondary | ICD-10-CM | POA: Diagnosis not present

## 2021-02-25 DIAGNOSIS — E785 Hyperlipidemia, unspecified: Secondary | ICD-10-CM | POA: Diagnosis not present

## 2021-02-25 DIAGNOSIS — K219 Gastro-esophageal reflux disease without esophagitis: Secondary | ICD-10-CM | POA: Diagnosis not present

## 2021-02-25 DIAGNOSIS — R2689 Other abnormalities of gait and mobility: Secondary | ICD-10-CM | POA: Diagnosis not present

## 2021-02-25 DIAGNOSIS — I110 Hypertensive heart disease with heart failure: Secondary | ICD-10-CM | POA: Diagnosis not present

## 2021-02-25 DIAGNOSIS — E1169 Type 2 diabetes mellitus with other specified complication: Secondary | ICD-10-CM | POA: Diagnosis not present

## 2021-02-25 DIAGNOSIS — M6281 Muscle weakness (generalized): Secondary | ICD-10-CM | POA: Diagnosis not present

## 2021-02-26 DIAGNOSIS — E785 Hyperlipidemia, unspecified: Secondary | ICD-10-CM | POA: Diagnosis not present

## 2021-02-26 DIAGNOSIS — I10 Essential (primary) hypertension: Secondary | ICD-10-CM | POA: Diagnosis not present

## 2021-02-26 DIAGNOSIS — M6281 Muscle weakness (generalized): Secondary | ICD-10-CM | POA: Diagnosis not present

## 2021-02-26 DIAGNOSIS — E119 Type 2 diabetes mellitus without complications: Secondary | ICD-10-CM | POA: Diagnosis not present

## 2021-02-26 DIAGNOSIS — E1169 Type 2 diabetes mellitus with other specified complication: Secondary | ICD-10-CM | POA: Diagnosis not present

## 2021-02-26 DIAGNOSIS — I110 Hypertensive heart disease with heart failure: Secondary | ICD-10-CM | POA: Diagnosis not present

## 2021-02-26 DIAGNOSIS — R2689 Other abnormalities of gait and mobility: Secondary | ICD-10-CM | POA: Diagnosis not present

## 2021-02-26 DIAGNOSIS — N390 Urinary tract infection, site not specified: Secondary | ICD-10-CM | POA: Diagnosis not present

## 2021-02-26 DIAGNOSIS — N3281 Overactive bladder: Secondary | ICD-10-CM | POA: Diagnosis not present

## 2021-02-27 DIAGNOSIS — R2689 Other abnormalities of gait and mobility: Secondary | ICD-10-CM | POA: Diagnosis not present

## 2021-02-27 DIAGNOSIS — E1169 Type 2 diabetes mellitus with other specified complication: Secondary | ICD-10-CM | POA: Diagnosis not present

## 2021-02-27 DIAGNOSIS — I110 Hypertensive heart disease with heart failure: Secondary | ICD-10-CM | POA: Diagnosis not present

## 2021-02-27 DIAGNOSIS — E785 Hyperlipidemia, unspecified: Secondary | ICD-10-CM | POA: Diagnosis not present

## 2021-02-27 DIAGNOSIS — M6281 Muscle weakness (generalized): Secondary | ICD-10-CM | POA: Diagnosis not present

## 2021-02-28 ENCOUNTER — Other Ambulatory Visit: Payer: Self-pay

## 2021-02-28 ENCOUNTER — Ambulatory Visit (INDEPENDENT_AMBULATORY_CARE_PROVIDER_SITE_OTHER): Payer: Medicare Other | Admitting: Podiatry

## 2021-02-28 ENCOUNTER — Encounter: Payer: Self-pay | Admitting: Podiatry

## 2021-02-28 DIAGNOSIS — E1159 Type 2 diabetes mellitus with other circulatory complications: Secondary | ICD-10-CM | POA: Diagnosis not present

## 2021-02-28 DIAGNOSIS — R2689 Other abnormalities of gait and mobility: Secondary | ICD-10-CM | POA: Diagnosis not present

## 2021-02-28 DIAGNOSIS — M2041 Other hammer toe(s) (acquired), right foot: Secondary | ICD-10-CM

## 2021-02-28 DIAGNOSIS — M6281 Muscle weakness (generalized): Secondary | ICD-10-CM | POA: Diagnosis not present

## 2021-02-28 DIAGNOSIS — E785 Hyperlipidemia, unspecified: Secondary | ICD-10-CM | POA: Diagnosis not present

## 2021-02-28 DIAGNOSIS — I110 Hypertensive heart disease with heart failure: Secondary | ICD-10-CM | POA: Diagnosis not present

## 2021-02-28 DIAGNOSIS — M2042 Other hammer toe(s) (acquired), left foot: Secondary | ICD-10-CM

## 2021-02-28 DIAGNOSIS — B351 Tinea unguium: Secondary | ICD-10-CM

## 2021-02-28 DIAGNOSIS — M79676 Pain in unspecified toe(s): Secondary | ICD-10-CM

## 2021-02-28 DIAGNOSIS — E1169 Type 2 diabetes mellitus with other specified complication: Secondary | ICD-10-CM | POA: Diagnosis not present

## 2021-02-28 NOTE — Progress Notes (Signed)
This patient returns to my office for at risk foot care.  This patient requires this care by a professional since this patient will be at risk due to having CKD and diabetes.  This patient is unable to cut nails himself since the patient cannot reach his nails.These nails are painful walking and wearing shoes. This patient presents to the office in a wheelchair.  This patient presents for at risk foot care today.  General Appearance  Alert, conversant and in no acute stress.  Vascular  Dorsalis pedis and posterior tibial  pulses are weakly  palpable  bilaterally.  Capillary return is within normal limits  bilaterally. Cold feet  Bilaterally. Absent digital hair B/L.  Neurologic  Senn-Weinstein monofilament wire test diminished   bilaterally. Muscle power within normal limits bilaterally.  Nails Thick disfigured discolored nails with subungual debris  from hallux to fifth toes bilaterally. No evidence of bacterial infection or drainage bilaterally.  Orthopedic  No limitations of motion  feet .  No crepitus or effusions noted.  No bony pathology or digital deformities noted.  Skin  normotropic skin with no porokeratosis noted bilaterally.  No signs of infections or ulcers noted.     Onychomycosis  Pain in right toes  Pain in left toes  Consent was obtained for treatment procedures.   Mechanical debridement of nails 1-5  bilaterally performed with a nail nipper.    RTC 3 months.     Return office visit   3 months                  Told patient to return for periodic foot care and evaluation due to potential at risk complications.   Kelilah Hebard DPM  

## 2021-03-01 DIAGNOSIS — E785 Hyperlipidemia, unspecified: Secondary | ICD-10-CM | POA: Diagnosis not present

## 2021-03-01 DIAGNOSIS — E1169 Type 2 diabetes mellitus with other specified complication: Secondary | ICD-10-CM | POA: Diagnosis not present

## 2021-03-01 DIAGNOSIS — R2689 Other abnormalities of gait and mobility: Secondary | ICD-10-CM | POA: Diagnosis not present

## 2021-03-01 DIAGNOSIS — I110 Hypertensive heart disease with heart failure: Secondary | ICD-10-CM | POA: Diagnosis not present

## 2021-03-01 DIAGNOSIS — M6281 Muscle weakness (generalized): Secondary | ICD-10-CM | POA: Diagnosis not present

## 2021-03-02 DIAGNOSIS — E785 Hyperlipidemia, unspecified: Secondary | ICD-10-CM | POA: Diagnosis not present

## 2021-03-02 DIAGNOSIS — M6281 Muscle weakness (generalized): Secondary | ICD-10-CM | POA: Diagnosis not present

## 2021-03-02 DIAGNOSIS — R2689 Other abnormalities of gait and mobility: Secondary | ICD-10-CM | POA: Diagnosis not present

## 2021-03-02 DIAGNOSIS — E1169 Type 2 diabetes mellitus with other specified complication: Secondary | ICD-10-CM | POA: Diagnosis not present

## 2021-03-02 DIAGNOSIS — I110 Hypertensive heart disease with heart failure: Secondary | ICD-10-CM | POA: Diagnosis not present

## 2021-03-05 DIAGNOSIS — R2689 Other abnormalities of gait and mobility: Secondary | ICD-10-CM | POA: Diagnosis not present

## 2021-03-05 DIAGNOSIS — M6281 Muscle weakness (generalized): Secondary | ICD-10-CM | POA: Diagnosis not present

## 2021-03-05 DIAGNOSIS — N390 Urinary tract infection, site not specified: Secondary | ICD-10-CM | POA: Diagnosis not present

## 2021-03-05 DIAGNOSIS — I110 Hypertensive heart disease with heart failure: Secondary | ICD-10-CM | POA: Diagnosis not present

## 2021-03-05 DIAGNOSIS — I10 Essential (primary) hypertension: Secondary | ICD-10-CM | POA: Diagnosis not present

## 2021-03-05 DIAGNOSIS — G4709 Other insomnia: Secondary | ICD-10-CM | POA: Diagnosis not present

## 2021-03-05 DIAGNOSIS — E1169 Type 2 diabetes mellitus with other specified complication: Secondary | ICD-10-CM | POA: Diagnosis not present

## 2021-03-05 DIAGNOSIS — E785 Hyperlipidemia, unspecified: Secondary | ICD-10-CM | POA: Diagnosis not present

## 2021-03-06 DIAGNOSIS — I110 Hypertensive heart disease with heart failure: Secondary | ICD-10-CM | POA: Diagnosis not present

## 2021-03-06 DIAGNOSIS — M6281 Muscle weakness (generalized): Secondary | ICD-10-CM | POA: Diagnosis not present

## 2021-03-06 DIAGNOSIS — R2689 Other abnormalities of gait and mobility: Secondary | ICD-10-CM | POA: Diagnosis not present

## 2021-03-06 DIAGNOSIS — E1169 Type 2 diabetes mellitus with other specified complication: Secondary | ICD-10-CM | POA: Diagnosis not present

## 2021-03-06 DIAGNOSIS — E785 Hyperlipidemia, unspecified: Secondary | ICD-10-CM | POA: Diagnosis not present

## 2021-03-07 DIAGNOSIS — E1169 Type 2 diabetes mellitus with other specified complication: Secondary | ICD-10-CM | POA: Diagnosis not present

## 2021-03-07 DIAGNOSIS — R2689 Other abnormalities of gait and mobility: Secondary | ICD-10-CM | POA: Diagnosis not present

## 2021-03-07 DIAGNOSIS — E785 Hyperlipidemia, unspecified: Secondary | ICD-10-CM | POA: Diagnosis not present

## 2021-03-07 DIAGNOSIS — M6281 Muscle weakness (generalized): Secondary | ICD-10-CM | POA: Diagnosis not present

## 2021-03-07 DIAGNOSIS — I110 Hypertensive heart disease with heart failure: Secondary | ICD-10-CM | POA: Diagnosis not present

## 2021-03-08 DIAGNOSIS — E1169 Type 2 diabetes mellitus with other specified complication: Secondary | ICD-10-CM | POA: Diagnosis not present

## 2021-03-08 DIAGNOSIS — I110 Hypertensive heart disease with heart failure: Secondary | ICD-10-CM | POA: Diagnosis not present

## 2021-03-08 DIAGNOSIS — M6281 Muscle weakness (generalized): Secondary | ICD-10-CM | POA: Diagnosis not present

## 2021-03-08 DIAGNOSIS — E785 Hyperlipidemia, unspecified: Secondary | ICD-10-CM | POA: Diagnosis not present

## 2021-03-08 DIAGNOSIS — R2689 Other abnormalities of gait and mobility: Secondary | ICD-10-CM | POA: Diagnosis not present

## 2021-03-09 DIAGNOSIS — M6281 Muscle weakness (generalized): Secondary | ICD-10-CM | POA: Diagnosis not present

## 2021-03-09 DIAGNOSIS — I110 Hypertensive heart disease with heart failure: Secondary | ICD-10-CM | POA: Diagnosis not present

## 2021-03-09 DIAGNOSIS — R2689 Other abnormalities of gait and mobility: Secondary | ICD-10-CM | POA: Diagnosis not present

## 2021-03-09 DIAGNOSIS — E1169 Type 2 diabetes mellitus with other specified complication: Secondary | ICD-10-CM | POA: Diagnosis not present

## 2021-03-09 DIAGNOSIS — E785 Hyperlipidemia, unspecified: Secondary | ICD-10-CM | POA: Diagnosis not present

## 2021-03-11 DIAGNOSIS — I10 Essential (primary) hypertension: Secondary | ICD-10-CM | POA: Diagnosis not present

## 2021-03-11 DIAGNOSIS — G4709 Other insomnia: Secondary | ICD-10-CM | POA: Diagnosis not present

## 2021-03-11 DIAGNOSIS — K219 Gastro-esophageal reflux disease without esophagitis: Secondary | ICD-10-CM | POA: Diagnosis not present

## 2021-03-12 DIAGNOSIS — E1169 Type 2 diabetes mellitus with other specified complication: Secondary | ICD-10-CM | POA: Diagnosis not present

## 2021-03-12 DIAGNOSIS — I110 Hypertensive heart disease with heart failure: Secondary | ICD-10-CM | POA: Diagnosis not present

## 2021-03-12 DIAGNOSIS — M6281 Muscle weakness (generalized): Secondary | ICD-10-CM | POA: Diagnosis not present

## 2021-03-12 DIAGNOSIS — E785 Hyperlipidemia, unspecified: Secondary | ICD-10-CM | POA: Diagnosis not present

## 2021-03-12 DIAGNOSIS — R2689 Other abnormalities of gait and mobility: Secondary | ICD-10-CM | POA: Diagnosis not present

## 2021-03-13 DIAGNOSIS — M6281 Muscle weakness (generalized): Secondary | ICD-10-CM | POA: Diagnosis not present

## 2021-03-13 DIAGNOSIS — R2689 Other abnormalities of gait and mobility: Secondary | ICD-10-CM | POA: Diagnosis not present

## 2021-03-13 DIAGNOSIS — E1169 Type 2 diabetes mellitus with other specified complication: Secondary | ICD-10-CM | POA: Diagnosis not present

## 2021-03-13 DIAGNOSIS — E785 Hyperlipidemia, unspecified: Secondary | ICD-10-CM | POA: Diagnosis not present

## 2021-03-13 DIAGNOSIS — I110 Hypertensive heart disease with heart failure: Secondary | ICD-10-CM | POA: Diagnosis not present

## 2021-03-14 DIAGNOSIS — I110 Hypertensive heart disease with heart failure: Secondary | ICD-10-CM | POA: Diagnosis not present

## 2021-03-14 DIAGNOSIS — S7292XA Unspecified fracture of left femur, initial encounter for closed fracture: Secondary | ICD-10-CM | POA: Diagnosis not present

## 2021-03-14 DIAGNOSIS — E1169 Type 2 diabetes mellitus with other specified complication: Secondary | ICD-10-CM | POA: Diagnosis not present

## 2021-03-14 DIAGNOSIS — E1122 Type 2 diabetes mellitus with diabetic chronic kidney disease: Secondary | ICD-10-CM | POA: Diagnosis not present

## 2021-03-14 DIAGNOSIS — M6281 Muscle weakness (generalized): Secondary | ICD-10-CM | POA: Diagnosis not present

## 2021-03-14 DIAGNOSIS — R2689 Other abnormalities of gait and mobility: Secondary | ICD-10-CM | POA: Diagnosis not present

## 2021-03-14 DIAGNOSIS — E785 Hyperlipidemia, unspecified: Secondary | ICD-10-CM | POA: Diagnosis not present

## 2021-03-15 DIAGNOSIS — R2689 Other abnormalities of gait and mobility: Secondary | ICD-10-CM | POA: Diagnosis not present

## 2021-03-15 DIAGNOSIS — G4709 Other insomnia: Secondary | ICD-10-CM | POA: Diagnosis not present

## 2021-03-15 DIAGNOSIS — K219 Gastro-esophageal reflux disease without esophagitis: Secondary | ICD-10-CM | POA: Diagnosis not present

## 2021-03-15 DIAGNOSIS — E1169 Type 2 diabetes mellitus with other specified complication: Secondary | ICD-10-CM | POA: Diagnosis not present

## 2021-03-15 DIAGNOSIS — I110 Hypertensive heart disease with heart failure: Secondary | ICD-10-CM | POA: Diagnosis not present

## 2021-03-15 DIAGNOSIS — I10 Essential (primary) hypertension: Secondary | ICD-10-CM | POA: Diagnosis not present

## 2021-03-15 DIAGNOSIS — M6281 Muscle weakness (generalized): Secondary | ICD-10-CM | POA: Diagnosis not present

## 2021-03-15 DIAGNOSIS — E785 Hyperlipidemia, unspecified: Secondary | ICD-10-CM | POA: Diagnosis not present

## 2021-03-16 DIAGNOSIS — I110 Hypertensive heart disease with heart failure: Secondary | ICD-10-CM | POA: Diagnosis not present

## 2021-03-16 DIAGNOSIS — E1169 Type 2 diabetes mellitus with other specified complication: Secondary | ICD-10-CM | POA: Diagnosis not present

## 2021-03-16 DIAGNOSIS — E785 Hyperlipidemia, unspecified: Secondary | ICD-10-CM | POA: Diagnosis not present

## 2021-03-16 DIAGNOSIS — R2689 Other abnormalities of gait and mobility: Secondary | ICD-10-CM | POA: Diagnosis not present

## 2021-03-16 DIAGNOSIS — M6281 Muscle weakness (generalized): Secondary | ICD-10-CM | POA: Diagnosis not present

## 2021-03-18 DIAGNOSIS — E785 Hyperlipidemia, unspecified: Secondary | ICD-10-CM | POA: Diagnosis not present

## 2021-03-18 DIAGNOSIS — R2689 Other abnormalities of gait and mobility: Secondary | ICD-10-CM | POA: Diagnosis not present

## 2021-03-18 DIAGNOSIS — E1169 Type 2 diabetes mellitus with other specified complication: Secondary | ICD-10-CM | POA: Diagnosis not present

## 2021-03-18 DIAGNOSIS — M6281 Muscle weakness (generalized): Secondary | ICD-10-CM | POA: Diagnosis not present

## 2021-03-18 DIAGNOSIS — I110 Hypertensive heart disease with heart failure: Secondary | ICD-10-CM | POA: Diagnosis not present

## 2021-03-19 DIAGNOSIS — E1169 Type 2 diabetes mellitus with other specified complication: Secondary | ICD-10-CM | POA: Diagnosis not present

## 2021-03-19 DIAGNOSIS — M6281 Muscle weakness (generalized): Secondary | ICD-10-CM | POA: Diagnosis not present

## 2021-03-19 DIAGNOSIS — K219 Gastro-esophageal reflux disease without esophagitis: Secondary | ICD-10-CM | POA: Diagnosis not present

## 2021-03-19 DIAGNOSIS — E785 Hyperlipidemia, unspecified: Secondary | ICD-10-CM | POA: Diagnosis not present

## 2021-03-19 DIAGNOSIS — I110 Hypertensive heart disease with heart failure: Secondary | ICD-10-CM | POA: Diagnosis not present

## 2021-03-19 DIAGNOSIS — I10 Essential (primary) hypertension: Secondary | ICD-10-CM | POA: Diagnosis not present

## 2021-03-19 DIAGNOSIS — R2689 Other abnormalities of gait and mobility: Secondary | ICD-10-CM | POA: Diagnosis not present

## 2021-03-20 DIAGNOSIS — E1169 Type 2 diabetes mellitus with other specified complication: Secondary | ICD-10-CM | POA: Diagnosis not present

## 2021-03-20 DIAGNOSIS — R2689 Other abnormalities of gait and mobility: Secondary | ICD-10-CM | POA: Diagnosis not present

## 2021-03-20 DIAGNOSIS — M6281 Muscle weakness (generalized): Secondary | ICD-10-CM | POA: Diagnosis not present

## 2021-03-20 DIAGNOSIS — E785 Hyperlipidemia, unspecified: Secondary | ICD-10-CM | POA: Diagnosis not present

## 2021-03-20 DIAGNOSIS — I110 Hypertensive heart disease with heart failure: Secondary | ICD-10-CM | POA: Diagnosis not present

## 2021-03-21 DIAGNOSIS — I110 Hypertensive heart disease with heart failure: Secondary | ICD-10-CM | POA: Diagnosis not present

## 2021-03-21 DIAGNOSIS — E1169 Type 2 diabetes mellitus with other specified complication: Secondary | ICD-10-CM | POA: Diagnosis not present

## 2021-03-21 DIAGNOSIS — R2689 Other abnormalities of gait and mobility: Secondary | ICD-10-CM | POA: Diagnosis not present

## 2021-03-21 DIAGNOSIS — N3281 Overactive bladder: Secondary | ICD-10-CM | POA: Diagnosis not present

## 2021-03-21 DIAGNOSIS — I10 Essential (primary) hypertension: Secondary | ICD-10-CM | POA: Diagnosis not present

## 2021-03-21 DIAGNOSIS — E785 Hyperlipidemia, unspecified: Secondary | ICD-10-CM | POA: Diagnosis not present

## 2021-03-21 DIAGNOSIS — M6281 Muscle weakness (generalized): Secondary | ICD-10-CM | POA: Diagnosis not present

## 2021-03-23 DIAGNOSIS — E1169 Type 2 diabetes mellitus with other specified complication: Secondary | ICD-10-CM | POA: Diagnosis not present

## 2021-03-23 DIAGNOSIS — E785 Hyperlipidemia, unspecified: Secondary | ICD-10-CM | POA: Diagnosis not present

## 2021-03-23 DIAGNOSIS — I110 Hypertensive heart disease with heart failure: Secondary | ICD-10-CM | POA: Diagnosis not present

## 2021-03-23 DIAGNOSIS — R2689 Other abnormalities of gait and mobility: Secondary | ICD-10-CM | POA: Diagnosis not present

## 2021-03-23 DIAGNOSIS — M6281 Muscle weakness (generalized): Secondary | ICD-10-CM | POA: Diagnosis not present

## 2021-03-24 DIAGNOSIS — N3281 Overactive bladder: Secondary | ICD-10-CM | POA: Diagnosis not present

## 2021-03-24 DIAGNOSIS — I10 Essential (primary) hypertension: Secondary | ICD-10-CM | POA: Diagnosis not present

## 2021-03-26 DIAGNOSIS — E1169 Type 2 diabetes mellitus with other specified complication: Secondary | ICD-10-CM | POA: Diagnosis not present

## 2021-03-26 DIAGNOSIS — R2689 Other abnormalities of gait and mobility: Secondary | ICD-10-CM | POA: Diagnosis not present

## 2021-03-26 DIAGNOSIS — E785 Hyperlipidemia, unspecified: Secondary | ICD-10-CM | POA: Diagnosis not present

## 2021-03-26 DIAGNOSIS — M6281 Muscle weakness (generalized): Secondary | ICD-10-CM | POA: Diagnosis not present

## 2021-03-26 DIAGNOSIS — I110 Hypertensive heart disease with heart failure: Secondary | ICD-10-CM | POA: Diagnosis not present

## 2021-03-29 DIAGNOSIS — R2689 Other abnormalities of gait and mobility: Secondary | ICD-10-CM | POA: Diagnosis not present

## 2021-03-29 DIAGNOSIS — E1169 Type 2 diabetes mellitus with other specified complication: Secondary | ICD-10-CM | POA: Diagnosis not present

## 2021-03-29 DIAGNOSIS — I110 Hypertensive heart disease with heart failure: Secondary | ICD-10-CM | POA: Diagnosis not present

## 2021-03-29 DIAGNOSIS — M6281 Muscle weakness (generalized): Secondary | ICD-10-CM | POA: Diagnosis not present

## 2021-03-29 DIAGNOSIS — E785 Hyperlipidemia, unspecified: Secondary | ICD-10-CM | POA: Diagnosis not present

## 2021-03-30 ENCOUNTER — Emergency Department (HOSPITAL_COMMUNITY)
Admission: EM | Admit: 2021-03-30 | Discharge: 2021-03-30 | Disposition: A | Discharge status: Home / Self Care | Payer: Medicare Other | Attending: Emergency Medicine | Admitting: Emergency Medicine

## 2021-03-30 ENCOUNTER — Encounter (HOSPITAL_COMMUNITY): Payer: Self-pay | Admitting: *Deleted

## 2021-03-30 ENCOUNTER — Other Ambulatory Visit: Payer: Self-pay

## 2021-03-30 DIAGNOSIS — W19XXXD Unspecified fall, subsequent encounter: Secondary | ICD-10-CM | POA: Diagnosis not present

## 2021-03-30 DIAGNOSIS — W19XXXA Unspecified fall, initial encounter: Secondary | ICD-10-CM | POA: Diagnosis not present

## 2021-03-30 DIAGNOSIS — S80212A Abrasion, left knee, initial encounter: Secondary | ICD-10-CM | POA: Diagnosis not present

## 2021-03-30 DIAGNOSIS — W050XXA Fall from non-moving wheelchair, initial encounter: Secondary | ICD-10-CM | POA: Diagnosis not present

## 2021-03-30 DIAGNOSIS — S80211A Abrasion, right knee, initial encounter: Secondary | ICD-10-CM | POA: Insufficient documentation

## 2021-03-30 DIAGNOSIS — S80919A Unspecified superficial injury of unspecified knee, initial encounter: Secondary | ICD-10-CM | POA: Diagnosis present

## 2021-03-30 DIAGNOSIS — R404 Transient alteration of awareness: Secondary | ICD-10-CM | POA: Diagnosis not present

## 2021-03-30 DIAGNOSIS — R410 Disorientation, unspecified: Secondary | ICD-10-CM | POA: Diagnosis not present

## 2021-03-30 DIAGNOSIS — S0990XA Unspecified injury of head, initial encounter: Secondary | ICD-10-CM | POA: Diagnosis not present

## 2021-03-30 DIAGNOSIS — I10 Essential (primary) hypertension: Secondary | ICD-10-CM | POA: Diagnosis not present

## 2021-03-30 DIAGNOSIS — Z043 Encounter for examination and observation following other accident: Secondary | ICD-10-CM | POA: Diagnosis not present

## 2021-03-30 DIAGNOSIS — Z7401 Bed confinement status: Secondary | ICD-10-CM | POA: Diagnosis not present

## 2021-03-30 NOTE — ED Notes (Signed)
Pt was given 2 sandwiches and diet ginger ale ?

## 2021-03-30 NOTE — ED Notes (Signed)
Attempt to reach staff at [place] [phone] without success. Left message for staff to call me back at this time ?

## 2021-03-30 NOTE — ED Triage Notes (Signed)
Pt from [name] by [facility]. Pt had an unwitnessed fall.  Pt states that he transferred to [facility] and then accidentally slip out of wheelchair.  Pt tells me that he did not hit his head and denies any injury.  He states that he was on ground after fall as he could not get up and is estimating that he was on the ground for 8 minutes.  Pt is alert and oriented (has short term memory loss at baseline).  Pt denies any pain, no injuries seen.  Pt is in [facility] and does not walk at baseline.  ?

## 2021-03-30 NOTE — Discharge Instructions (Signed)
Follow-up with your doctor as needed.  Please use assistance when transferring to avoid additional falls. ?

## 2021-03-30 NOTE — ED Notes (Addendum)
Pt is resting.

## 2021-03-30 NOTE — ED Notes (Signed)
Gave pt a [place] sandwich and another diet gingerale as he was feeling hungry ?

## 2021-03-30 NOTE — ED Notes (Signed)
Call for [facility] transport back to [place] home.   ?

## 2021-03-30 NOTE — ED Provider Notes (Signed)
[facility] DEPT Provider Note   CSN: [id number] Arrival date & time: 03/30/21  2694     History  Chief Complaint  Patient presents with   Ricky [name]. is a 81 y.o. male.  Patient presents by ambulance from [place].  Patient was found down on the floor.  Patient states that he was lying in bed listening to the basketball game that was on tonight.  He knows the appropriate teams and scoring during the game.  He then turned on music and an advice radio show.  He stated that he decided to get into his wheelchair.  He is unable to walk at baseline and requires some assistance with transferring.  He got up by himself.  He then stated that he wanted to put pants on and try to do this from his wheelchair.  This failed and he slid, adamantly denies falling, but went down to his knees.  He was able to crawl over to a door after several minutes and alert someone that he was on the ground.  Per facility policy, EMS was called for transport.  Patient denies hitting his head and denies neck pain.  No hip pain.  Blood sugar was in the 200s with EMS, they state that this is normal per facility for this time of night.  He denies any recent illnesses, vomiting, diarrhea, fevers.      Home Medications Prior to Admission medications   Medication Sig Start Date End Date Taking? Authorizing Provider  ALPRAZolam Duanne Moron) 0.25 MG tablet Take by mouth. 02/02/21   [provider]  ALPRAZolam Duanne Moron) 0.5 MG tablet Take 1 tablet (0.5 mg total) by mouth at bedtime. Patient taking differently: Take 0.25 mg by mouth at bedtime. 01/22/18   Nita Sells, MD  amLODipine (NORVASC) 2.5 MG tablet  01/18/20   [provider]  amLODipine (NORVASC) 5 MG tablet Take 5 mg by mouth daily. 09/04/12   [provider]  Calcium Citrate-Vitamin D (CITRACAL + D PO) Take 1 tablet by mouth daily.    [provider]  Cholecalciferol  (VITAMIN D-3) 1000 units CAPS Take 1 capsule by mouth daily.    [provider]  docusate sodium (COLACE) 100 MG capsule Take 100 mg by mouth 2 (two) times daily.    [provider]  empagliflozin (JARDIANCE) 10 MG TABS tablet Take 10 mg by mouth daily. 01/22/18   Nita Sells, MD  fluvoxaMINE (LUVOX) 100 MG tablet Take 1 tablet (100 mg total) by mouth at bedtime. 01/22/18   Nita Sells, MD  furosemide (LASIX) 20 MG tablet Take 20 mg by mouth daily before breakfast.  12/07/11   [provider]  HUMALOG KWIKPEN 100 UNIT/ML KiwkPen Inject 2-11 Units into the skin See admin instructions. Times: 0630 1130 1630  Scale: 101-150: 2 units 151-200: 3 units 201-250: 5 units 251-300: 7 units 301-350: 9 units >350: 11 units 09/30/17   [provider]  hydrOXYzine (VISTARIL) 25 MG capsule     [provider]  insulin detemir (LEVEMIR) 100 UNIT/ML injection Inject 0.3 mLs (30 Units total) into the skin at bedtime. Patient taking differently: Inject 24 Units into the skin at bedtime. 01/22/18   Nita Sells, MD  insulin glargine (LANTUS) 100 UNIT/ML injection 25 u    [provider]  insulin regular (NOVOLIN R) 100 units/mL injection ss    [provider]  meloxicam (MOBIC) 15 MG tablet 1 tablet  [provider]  Menthol, Topical Analgesic, (BIOFREEZE) 4 % GEL Apply topically.    [provider]  metFORMIN (GLUCOPHAGE) 500 MG tablet 1 tablet with meals    [provider]  Multiple Vitamins-Minerals (THERAGRAN-M PREMIER 50 PLUS PO) Take 1 tablet by mouth daily.    [provider]  MYRBETRIQ 25 MG TB24 tablet Take 25 mg by mouth daily.  10/05/16   [provider]  OLANZapine (ZYPREXA) 5 MG tablet Take 5 tablets (25 mg total) by mouth daily. 01/22/18   Nita Sells, MD  Omega-3 Fatty Acids (FISH OIL) 1200 MG CAPS     [provider]  omeprazole (PRILOSEC) 40 MG  capsule Take 40 mg by mouth daily.  09/28/12   [provider]  polyethylene glycol powder (GLYCOLAX/MIRALAX) 17 GM/SCOOP powder Take 1 Container by mouth once.    [provider]  pravastatin (PRAVACHOL) 40 MG tablet Take 40 mg by mouth daily after lunch.     [provider]  tamsulosin (FLOMAX) 0.4 MG CAPS capsule Take 0.4 mg by mouth daily.  10/01/16   [provider]  traMADol (ULTRAM) 50 MG tablet Take 1 tablet (50 mg total) by mouth every 6 (six) hours as needed. 09/10/20   Lajean Saver, MD  zolpidem (AMBIEN) 5 MG tablet Take 1 tablet (5 mg total) by mouth at bedtime. 01/22/18   Nita Sells, MD      Allergies    Asa [aspirin], Codeine, Tylenol with codeine #3 [acetaminophen-codeine], and Tylenol [acetaminophen]    Review of Systems   Review of Systems  Physical Exam Updated Vital Signs There were no vitals taken for this visit. Physical Exam Vitals and nursing note reviewed.  Constitutional:      Appearance: He is well-developed.  HENT:     Head: Normocephalic and atraumatic. No raccoon eyes or Battle's sign.     Right Ear: Tympanic membrane, ear canal and external ear normal. No hemotympanum.     Left Ear: Tympanic membrane, ear canal and external ear normal. No hemotympanum.     Nose: Nose normal.  Eyes:     General: Lids are normal.     Conjunctiva/sclera: Conjunctivae normal.     Pupils: Pupils are equal, round, and reactive to light.     Comments: No visible hyphema  Cardiovascular:     Rate and Rhythm: Normal rate and regular rhythm.     Comments: Lungs clear, no significant bruising over the chest. Pulmonary:     Effort: Pulmonary effort is normal.     Breath sounds: Normal breath sounds.  Abdominal:     Palpations: Abdomen is soft.     Tenderness: There is no abdominal tenderness.     Comments: No abdominal tenderness.  Musculoskeletal:        General: Normal range of motion.     Cervical back: Normal range of motion and  neck supple. No tenderness or bony tenderness.     Thoracic back: No tenderness or bony tenderness.     Lumbar back: No tenderness or bony tenderness.     Comments: No tenderness to palpation over the pelvis or hips.  Able to range the patient's hips without pain.  No knee pain, old appearing, mild abrasions noted to bilateral knees.  Skin:    General: Skin is warm and dry.  Neurological:     Mental Status: He is alert and oriented to person, place, and time.     GCS: GCS eye subscore is 4. GCS  verbal subscore is 5. GCS motor subscore is 6.     Cranial Nerves: No cranial nerve deficit.     Sensory: No sensory deficit.     Coordination: Coordination normal.    ED Results / Procedures / Treatments   Labs (all labs ordered are listed, but only abnormal results are displayed) Labs Reviewed - No data to display  EKG None  Radiology No results found.  Procedures Procedures    Medications Ordered in ED Medications - No data to display  ED Course/ Medical Decision Making/ A&P    Patient seen and examined. History obtained directly from patient.   Imaging: Considered imaging of the head and neck, patient refuses. He seems to be oriented and give an appropriate history.  Considered x-ray of the chest and pelvis however patient with no focal symptoms and again no objective signs of trauma on exam.  Most recent vital signs reviewed and are as follows: BP 125/82    Temp 98.4 F (36.9 C) (Oral)    Resp 18    SpO2 95%   Initial impression: Fall, no obvious injury.   Discussed with Dr. Ralene Bathe who will see patient.   4:08 AM patient seen by Dr. Ralene Bathe.  Patient cleared for discharge.  He does not want any additional work-up and seems appropriate.  Patient urged to return with worsening symptoms or other concerns. Patient verbalized understanding and agrees with plan.                            Medical Decision Making  Patient found down at facility.  He denies significant fall, head  or neck injury.  He seems appropriate, but is admittedly high risk due to age.  No anticoagulation.  No objective signs of trauma on exam.  He does not want further work-up.  Cleared for discharge.  Encouraged return with worsening.        Final Clinical Impression(s) / ED Diagnoses Final diagnoses:  Fall, initial encounter    Rx / DC Orders ED Discharge Orders     None         Carlisle Cater, Hershal Coria 03/30/21 0410    Quintella Reichert, MD 03/30/21 0530

## 2021-04-04 DIAGNOSIS — N390 Urinary tract infection, site not specified: Secondary | ICD-10-CM | POA: Diagnosis not present

## 2021-04-05 DIAGNOSIS — I10 Essential (primary) hypertension: Secondary | ICD-10-CM | POA: Diagnosis not present

## 2021-04-05 DIAGNOSIS — K219 Gastro-esophageal reflux disease without esophagitis: Secondary | ICD-10-CM | POA: Diagnosis not present

## 2021-04-05 DIAGNOSIS — N390 Urinary tract infection, site not specified: Secondary | ICD-10-CM | POA: Diagnosis not present

## 2021-04-08 DIAGNOSIS — K5909 Other constipation: Secondary | ICD-10-CM | POA: Diagnosis not present

## 2021-04-08 DIAGNOSIS — I10 Essential (primary) hypertension: Secondary | ICD-10-CM | POA: Diagnosis not present

## 2021-04-17 DIAGNOSIS — G4709 Other insomnia: Secondary | ICD-10-CM | POA: Diagnosis not present

## 2021-04-17 DIAGNOSIS — K219 Gastro-esophageal reflux disease without esophagitis: Secondary | ICD-10-CM | POA: Diagnosis not present

## 2021-04-17 DIAGNOSIS — I10 Essential (primary) hypertension: Secondary | ICD-10-CM | POA: Diagnosis not present

## 2021-04-20 DIAGNOSIS — I10 Essential (primary) hypertension: Secondary | ICD-10-CM | POA: Diagnosis not present

## 2021-04-20 DIAGNOSIS — E119 Type 2 diabetes mellitus without complications: Secondary | ICD-10-CM | POA: Diagnosis not present

## 2021-04-21 DIAGNOSIS — I10 Essential (primary) hypertension: Secondary | ICD-10-CM | POA: Diagnosis not present

## 2021-04-21 DIAGNOSIS — K219 Gastro-esophageal reflux disease without esophagitis: Secondary | ICD-10-CM | POA: Diagnosis not present

## 2021-05-02 DIAGNOSIS — E119 Type 2 diabetes mellitus without complications: Secondary | ICD-10-CM | POA: Diagnosis not present

## 2021-05-02 DIAGNOSIS — G4709 Other insomnia: Secondary | ICD-10-CM | POA: Diagnosis not present

## 2021-05-02 DIAGNOSIS — I10 Essential (primary) hypertension: Secondary | ICD-10-CM | POA: Diagnosis not present

## 2021-05-10 DIAGNOSIS — I10 Essential (primary) hypertension: Secondary | ICD-10-CM | POA: Diagnosis not present

## 2021-05-10 DIAGNOSIS — E119 Type 2 diabetes mellitus without complications: Secondary | ICD-10-CM | POA: Diagnosis not present

## 2021-05-10 DIAGNOSIS — G4709 Other insomnia: Secondary | ICD-10-CM | POA: Diagnosis not present

## 2021-05-16 DIAGNOSIS — E1122 Type 2 diabetes mellitus with diabetic chronic kidney disease: Secondary | ICD-10-CM | POA: Diagnosis not present

## 2021-05-18 DIAGNOSIS — I10 Essential (primary) hypertension: Secondary | ICD-10-CM | POA: Diagnosis not present

## 2021-05-18 DIAGNOSIS — E119 Type 2 diabetes mellitus without complications: Secondary | ICD-10-CM | POA: Diagnosis not present

## 2021-05-24 DIAGNOSIS — I10 Essential (primary) hypertension: Secondary | ICD-10-CM | POA: Diagnosis not present

## 2021-05-24 DIAGNOSIS — G4709 Other insomnia: Secondary | ICD-10-CM | POA: Diagnosis not present

## 2021-05-24 DIAGNOSIS — K219 Gastro-esophageal reflux disease without esophagitis: Secondary | ICD-10-CM | POA: Diagnosis not present

## 2021-05-29 DIAGNOSIS — M6281 Muscle weakness (generalized): Secondary | ICD-10-CM | POA: Diagnosis not present

## 2021-05-29 DIAGNOSIS — R2689 Other abnormalities of gait and mobility: Secondary | ICD-10-CM | POA: Diagnosis not present

## 2021-05-29 DIAGNOSIS — E1169 Type 2 diabetes mellitus with other specified complication: Secondary | ICD-10-CM | POA: Diagnosis not present

## 2021-05-29 DIAGNOSIS — E785 Hyperlipidemia, unspecified: Secondary | ICD-10-CM | POA: Diagnosis not present

## 2021-05-29 DIAGNOSIS — I110 Hypertensive heart disease with heart failure: Secondary | ICD-10-CM | POA: Diagnosis not present

## 2021-05-30 ENCOUNTER — Encounter: Payer: Self-pay | Admitting: Podiatry

## 2021-05-30 ENCOUNTER — Ambulatory Visit (INDEPENDENT_AMBULATORY_CARE_PROVIDER_SITE_OTHER): Payer: Medicare Other | Admitting: Podiatry

## 2021-05-30 DIAGNOSIS — E1159 Type 2 diabetes mellitus with other circulatory complications: Secondary | ICD-10-CM

## 2021-05-30 DIAGNOSIS — B351 Tinea unguium: Secondary | ICD-10-CM | POA: Diagnosis not present

## 2021-05-30 DIAGNOSIS — M79609 Pain in unspecified limb: Secondary | ICD-10-CM | POA: Diagnosis not present

## 2021-05-30 DIAGNOSIS — M2041 Other hammer toe(s) (acquired), right foot: Secondary | ICD-10-CM

## 2021-05-30 DIAGNOSIS — M2042 Other hammer toe(s) (acquired), left foot: Secondary | ICD-10-CM

## 2021-05-30 NOTE — Progress Notes (Signed)
This patient returns to my office for at risk foot care.  This patient requires this care by a professional since this patient will be at risk due to having CKD and diabetes.  This patient is unable to cut nails himself since the patient cannot reach his nails.These nails are painful walking and wearing shoes. This patient presents to the office in a wheelchair.  This patient presents for at risk foot care today.  General Appearance  Alert, conversant and in no acute stress.  Vascular  Dorsalis pedis and posterior tibial  pulses are weakly  palpable  bilaterally.  Capillary return is within normal limits  bilaterally. Cold feet  Bilaterally. Absent digital hair B/L.  Neurologic  Senn-Weinstein monofilament wire test diminished   bilaterally. Muscle power within normal limits bilaterally.  Nails Thick disfigured discolored nails with subungual debris  from hallux to fifth toes bilaterally. No evidence of bacterial infection or drainage bilaterally.  Orthopedic  No limitations of motion  feet .  No crepitus or effusions noted.  No bony pathology or digital deformities noted.  Skin  normotropic skin with no porokeratosis noted bilaterally.  No signs of infections or ulcers noted.     Onychomycosis  Pain in right toes  Pain in left toes  Consent was obtained for treatment procedures.   Mechanical debridement of nails 1-5  bilaterally performed with a nail nipper.    RTC 3 months.     Return office visit   3 months                  Told patient to return for periodic foot care and evaluation due to potential at risk complications.   Grenda Lora DPM  

## 2021-05-31 DIAGNOSIS — I110 Hypertensive heart disease with heart failure: Secondary | ICD-10-CM | POA: Diagnosis not present

## 2021-05-31 DIAGNOSIS — R2689 Other abnormalities of gait and mobility: Secondary | ICD-10-CM | POA: Diagnosis not present

## 2021-05-31 DIAGNOSIS — M6281 Muscle weakness (generalized): Secondary | ICD-10-CM | POA: Diagnosis not present

## 2021-05-31 DIAGNOSIS — E1169 Type 2 diabetes mellitus with other specified complication: Secondary | ICD-10-CM | POA: Diagnosis not present

## 2021-05-31 DIAGNOSIS — E785 Hyperlipidemia, unspecified: Secondary | ICD-10-CM | POA: Diagnosis not present

## 2021-06-01 DIAGNOSIS — E1169 Type 2 diabetes mellitus with other specified complication: Secondary | ICD-10-CM | POA: Diagnosis not present

## 2021-06-01 DIAGNOSIS — M6281 Muscle weakness (generalized): Secondary | ICD-10-CM | POA: Diagnosis not present

## 2021-06-01 DIAGNOSIS — I110 Hypertensive heart disease with heart failure: Secondary | ICD-10-CM | POA: Diagnosis not present

## 2021-06-01 DIAGNOSIS — E785 Hyperlipidemia, unspecified: Secondary | ICD-10-CM | POA: Diagnosis not present

## 2021-06-01 DIAGNOSIS — R2689 Other abnormalities of gait and mobility: Secondary | ICD-10-CM | POA: Diagnosis not present

## 2021-06-02 DIAGNOSIS — E039 Hypothyroidism, unspecified: Secondary | ICD-10-CM | POA: Diagnosis not present

## 2021-06-02 DIAGNOSIS — M6281 Muscle weakness (generalized): Secondary | ICD-10-CM | POA: Diagnosis not present

## 2021-06-02 DIAGNOSIS — R2689 Other abnormalities of gait and mobility: Secondary | ICD-10-CM | POA: Diagnosis not present

## 2021-06-02 DIAGNOSIS — E1169 Type 2 diabetes mellitus with other specified complication: Secondary | ICD-10-CM | POA: Diagnosis not present

## 2021-06-02 DIAGNOSIS — E119 Type 2 diabetes mellitus without complications: Secondary | ICD-10-CM | POA: Diagnosis not present

## 2021-06-02 DIAGNOSIS — E785 Hyperlipidemia, unspecified: Secondary | ICD-10-CM | POA: Diagnosis not present

## 2021-06-02 DIAGNOSIS — I110 Hypertensive heart disease with heart failure: Secondary | ICD-10-CM | POA: Diagnosis not present

## 2021-06-05 DIAGNOSIS — E785 Hyperlipidemia, unspecified: Secondary | ICD-10-CM | POA: Diagnosis not present

## 2021-06-05 DIAGNOSIS — M6281 Muscle weakness (generalized): Secondary | ICD-10-CM | POA: Diagnosis not present

## 2021-06-05 DIAGNOSIS — I110 Hypertensive heart disease with heart failure: Secondary | ICD-10-CM | POA: Diagnosis not present

## 2021-06-05 DIAGNOSIS — R2689 Other abnormalities of gait and mobility: Secondary | ICD-10-CM | POA: Diagnosis not present

## 2021-06-05 DIAGNOSIS — E1169 Type 2 diabetes mellitus with other specified complication: Secondary | ICD-10-CM | POA: Diagnosis not present

## 2021-06-07 DIAGNOSIS — M6281 Muscle weakness (generalized): Secondary | ICD-10-CM | POA: Diagnosis not present

## 2021-06-07 DIAGNOSIS — I110 Hypertensive heart disease with heart failure: Secondary | ICD-10-CM | POA: Diagnosis not present

## 2021-06-07 DIAGNOSIS — R2689 Other abnormalities of gait and mobility: Secondary | ICD-10-CM | POA: Diagnosis not present

## 2021-06-07 DIAGNOSIS — E785 Hyperlipidemia, unspecified: Secondary | ICD-10-CM | POA: Diagnosis not present

## 2021-06-07 DIAGNOSIS — E1169 Type 2 diabetes mellitus with other specified complication: Secondary | ICD-10-CM | POA: Diagnosis not present

## 2021-06-08 DIAGNOSIS — I110 Hypertensive heart disease with heart failure: Secondary | ICD-10-CM | POA: Diagnosis not present

## 2021-06-08 DIAGNOSIS — R2689 Other abnormalities of gait and mobility: Secondary | ICD-10-CM | POA: Diagnosis not present

## 2021-06-08 DIAGNOSIS — I10 Essential (primary) hypertension: Secondary | ICD-10-CM | POA: Diagnosis not present

## 2021-06-08 DIAGNOSIS — E119 Type 2 diabetes mellitus without complications: Secondary | ICD-10-CM | POA: Diagnosis not present

## 2021-06-08 DIAGNOSIS — E1169 Type 2 diabetes mellitus with other specified complication: Secondary | ICD-10-CM | POA: Diagnosis not present

## 2021-06-08 DIAGNOSIS — M6281 Muscle weakness (generalized): Secondary | ICD-10-CM | POA: Diagnosis not present

## 2021-06-08 DIAGNOSIS — E785 Hyperlipidemia, unspecified: Secondary | ICD-10-CM | POA: Diagnosis not present

## 2021-06-10 DIAGNOSIS — R2689 Other abnormalities of gait and mobility: Secondary | ICD-10-CM | POA: Diagnosis not present

## 2021-06-10 DIAGNOSIS — E785 Hyperlipidemia, unspecified: Secondary | ICD-10-CM | POA: Diagnosis not present

## 2021-06-10 DIAGNOSIS — M6281 Muscle weakness (generalized): Secondary | ICD-10-CM | POA: Diagnosis not present

## 2021-06-10 DIAGNOSIS — I110 Hypertensive heart disease with heart failure: Secondary | ICD-10-CM | POA: Diagnosis not present

## 2021-06-10 DIAGNOSIS — E1169 Type 2 diabetes mellitus with other specified complication: Secondary | ICD-10-CM | POA: Diagnosis not present

## 2021-06-11 DIAGNOSIS — G4709 Other insomnia: Secondary | ICD-10-CM | POA: Diagnosis not present

## 2021-06-11 DIAGNOSIS — E785 Hyperlipidemia, unspecified: Secondary | ICD-10-CM | POA: Diagnosis not present

## 2021-06-11 DIAGNOSIS — I110 Hypertensive heart disease with heart failure: Secondary | ICD-10-CM | POA: Diagnosis not present

## 2021-06-11 DIAGNOSIS — I10 Essential (primary) hypertension: Secondary | ICD-10-CM | POA: Diagnosis not present

## 2021-06-11 DIAGNOSIS — R2689 Other abnormalities of gait and mobility: Secondary | ICD-10-CM | POA: Diagnosis not present

## 2021-06-11 DIAGNOSIS — E1169 Type 2 diabetes mellitus with other specified complication: Secondary | ICD-10-CM | POA: Diagnosis not present

## 2021-06-11 DIAGNOSIS — M6281 Muscle weakness (generalized): Secondary | ICD-10-CM | POA: Diagnosis not present

## 2021-06-11 DIAGNOSIS — N3281 Overactive bladder: Secondary | ICD-10-CM | POA: Diagnosis not present

## 2021-06-12 DIAGNOSIS — M6281 Muscle weakness (generalized): Secondary | ICD-10-CM | POA: Diagnosis not present

## 2021-06-12 DIAGNOSIS — R2689 Other abnormalities of gait and mobility: Secondary | ICD-10-CM | POA: Diagnosis not present

## 2021-06-12 DIAGNOSIS — E785 Hyperlipidemia, unspecified: Secondary | ICD-10-CM | POA: Diagnosis not present

## 2021-06-12 DIAGNOSIS — I110 Hypertensive heart disease with heart failure: Secondary | ICD-10-CM | POA: Diagnosis not present

## 2021-06-12 DIAGNOSIS — E1169 Type 2 diabetes mellitus with other specified complication: Secondary | ICD-10-CM | POA: Diagnosis not present

## 2021-06-13 DIAGNOSIS — E1169 Type 2 diabetes mellitus with other specified complication: Secondary | ICD-10-CM | POA: Diagnosis not present

## 2021-06-13 DIAGNOSIS — I110 Hypertensive heart disease with heart failure: Secondary | ICD-10-CM | POA: Diagnosis not present

## 2021-06-13 DIAGNOSIS — M6281 Muscle weakness (generalized): Secondary | ICD-10-CM | POA: Diagnosis not present

## 2021-06-13 DIAGNOSIS — R2689 Other abnormalities of gait and mobility: Secondary | ICD-10-CM | POA: Diagnosis not present

## 2021-06-13 DIAGNOSIS — E785 Hyperlipidemia, unspecified: Secondary | ICD-10-CM | POA: Diagnosis not present

## 2021-06-14 DIAGNOSIS — I110 Hypertensive heart disease with heart failure: Secondary | ICD-10-CM | POA: Diagnosis not present

## 2021-06-14 DIAGNOSIS — R2689 Other abnormalities of gait and mobility: Secondary | ICD-10-CM | POA: Diagnosis not present

## 2021-06-14 DIAGNOSIS — E1169 Type 2 diabetes mellitus with other specified complication: Secondary | ICD-10-CM | POA: Diagnosis not present

## 2021-06-14 DIAGNOSIS — M6281 Muscle weakness (generalized): Secondary | ICD-10-CM | POA: Diagnosis not present

## 2021-06-14 DIAGNOSIS — E785 Hyperlipidemia, unspecified: Secondary | ICD-10-CM | POA: Diagnosis not present

## 2021-06-15 DIAGNOSIS — M6281 Muscle weakness (generalized): Secondary | ICD-10-CM | POA: Diagnosis not present

## 2021-06-15 DIAGNOSIS — E1169 Type 2 diabetes mellitus with other specified complication: Secondary | ICD-10-CM | POA: Diagnosis not present

## 2021-06-15 DIAGNOSIS — E785 Hyperlipidemia, unspecified: Secondary | ICD-10-CM | POA: Diagnosis not present

## 2021-06-15 DIAGNOSIS — R2689 Other abnormalities of gait and mobility: Secondary | ICD-10-CM | POA: Diagnosis not present

## 2021-06-15 DIAGNOSIS — I110 Hypertensive heart disease with heart failure: Secondary | ICD-10-CM | POA: Diagnosis not present

## 2021-06-16 DIAGNOSIS — I10 Essential (primary) hypertension: Secondary | ICD-10-CM | POA: Diagnosis not present

## 2021-06-16 DIAGNOSIS — K5909 Other constipation: Secondary | ICD-10-CM | POA: Diagnosis not present

## 2021-06-16 DIAGNOSIS — E1169 Type 2 diabetes mellitus with other specified complication: Secondary | ICD-10-CM | POA: Diagnosis not present

## 2021-06-16 DIAGNOSIS — K219 Gastro-esophageal reflux disease without esophagitis: Secondary | ICD-10-CM | POA: Diagnosis not present

## 2021-06-18 DIAGNOSIS — R2689 Other abnormalities of gait and mobility: Secondary | ICD-10-CM | POA: Diagnosis not present

## 2021-06-18 DIAGNOSIS — E1169 Type 2 diabetes mellitus with other specified complication: Secondary | ICD-10-CM | POA: Diagnosis not present

## 2021-06-18 DIAGNOSIS — M6281 Muscle weakness (generalized): Secondary | ICD-10-CM | POA: Diagnosis not present

## 2021-06-18 DIAGNOSIS — I110 Hypertensive heart disease with heart failure: Secondary | ICD-10-CM | POA: Diagnosis not present

## 2021-06-18 DIAGNOSIS — E785 Hyperlipidemia, unspecified: Secondary | ICD-10-CM | POA: Diagnosis not present

## 2021-06-19 DIAGNOSIS — I110 Hypertensive heart disease with heart failure: Secondary | ICD-10-CM | POA: Diagnosis not present

## 2021-06-19 DIAGNOSIS — M6281 Muscle weakness (generalized): Secondary | ICD-10-CM | POA: Diagnosis not present

## 2021-06-19 DIAGNOSIS — E785 Hyperlipidemia, unspecified: Secondary | ICD-10-CM | POA: Diagnosis not present

## 2021-06-19 DIAGNOSIS — E1169 Type 2 diabetes mellitus with other specified complication: Secondary | ICD-10-CM | POA: Diagnosis not present

## 2021-06-19 DIAGNOSIS — R2689 Other abnormalities of gait and mobility: Secondary | ICD-10-CM | POA: Diagnosis not present

## 2021-06-20 DIAGNOSIS — I10 Essential (primary) hypertension: Secondary | ICD-10-CM | POA: Diagnosis not present

## 2021-06-20 DIAGNOSIS — E785 Hyperlipidemia, unspecified: Secondary | ICD-10-CM | POA: Diagnosis not present

## 2021-06-20 DIAGNOSIS — R2689 Other abnormalities of gait and mobility: Secondary | ICD-10-CM | POA: Diagnosis not present

## 2021-06-20 DIAGNOSIS — I110 Hypertensive heart disease with heart failure: Secondary | ICD-10-CM | POA: Diagnosis not present

## 2021-06-20 DIAGNOSIS — M6281 Muscle weakness (generalized): Secondary | ICD-10-CM | POA: Diagnosis not present

## 2021-06-20 DIAGNOSIS — E119 Type 2 diabetes mellitus without complications: Secondary | ICD-10-CM | POA: Diagnosis not present

## 2021-06-20 DIAGNOSIS — E1169 Type 2 diabetes mellitus with other specified complication: Secondary | ICD-10-CM | POA: Diagnosis not present

## 2021-06-21 DIAGNOSIS — N390 Urinary tract infection, site not specified: Secondary | ICD-10-CM | POA: Diagnosis not present

## 2021-06-21 DIAGNOSIS — E785 Hyperlipidemia, unspecified: Secondary | ICD-10-CM | POA: Diagnosis not present

## 2021-06-21 DIAGNOSIS — I110 Hypertensive heart disease with heart failure: Secondary | ICD-10-CM | POA: Diagnosis not present

## 2021-06-21 DIAGNOSIS — M6281 Muscle weakness (generalized): Secondary | ICD-10-CM | POA: Diagnosis not present

## 2021-06-21 DIAGNOSIS — R2689 Other abnormalities of gait and mobility: Secondary | ICD-10-CM | POA: Diagnosis not present

## 2021-06-21 DIAGNOSIS — M6259 Muscle wasting and atrophy, not elsewhere classified, multiple sites: Secondary | ICD-10-CM | POA: Diagnosis not present

## 2021-06-21 DIAGNOSIS — E1169 Type 2 diabetes mellitus with other specified complication: Secondary | ICD-10-CM | POA: Diagnosis not present

## 2021-06-22 DIAGNOSIS — I110 Hypertensive heart disease with heart failure: Secondary | ICD-10-CM | POA: Diagnosis not present

## 2021-06-22 DIAGNOSIS — N390 Urinary tract infection, site not specified: Secondary | ICD-10-CM | POA: Diagnosis not present

## 2021-06-22 DIAGNOSIS — R2689 Other abnormalities of gait and mobility: Secondary | ICD-10-CM | POA: Diagnosis not present

## 2021-06-22 DIAGNOSIS — M6259 Muscle wasting and atrophy, not elsewhere classified, multiple sites: Secondary | ICD-10-CM | POA: Diagnosis not present

## 2021-06-22 DIAGNOSIS — I10 Essential (primary) hypertension: Secondary | ICD-10-CM | POA: Diagnosis not present

## 2021-06-22 DIAGNOSIS — I509 Heart failure, unspecified: Secondary | ICD-10-CM | POA: Diagnosis not present

## 2021-06-22 DIAGNOSIS — E1169 Type 2 diabetes mellitus with other specified complication: Secondary | ICD-10-CM | POA: Diagnosis not present

## 2021-06-22 DIAGNOSIS — K219 Gastro-esophageal reflux disease without esophagitis: Secondary | ICD-10-CM | POA: Diagnosis not present

## 2021-06-22 DIAGNOSIS — M6281 Muscle weakness (generalized): Secondary | ICD-10-CM | POA: Diagnosis not present

## 2021-06-22 DIAGNOSIS — E785 Hyperlipidemia, unspecified: Secondary | ICD-10-CM | POA: Diagnosis not present

## 2021-06-23 DIAGNOSIS — R0602 Shortness of breath: Secondary | ICD-10-CM | POA: Diagnosis not present

## 2021-06-25 DIAGNOSIS — M6281 Muscle weakness (generalized): Secondary | ICD-10-CM | POA: Diagnosis not present

## 2021-06-25 DIAGNOSIS — I110 Hypertensive heart disease with heart failure: Secondary | ICD-10-CM | POA: Diagnosis not present

## 2021-06-25 DIAGNOSIS — R2689 Other abnormalities of gait and mobility: Secondary | ICD-10-CM | POA: Diagnosis not present

## 2021-06-25 DIAGNOSIS — M6259 Muscle wasting and atrophy, not elsewhere classified, multiple sites: Secondary | ICD-10-CM | POA: Diagnosis not present

## 2021-06-25 DIAGNOSIS — E785 Hyperlipidemia, unspecified: Secondary | ICD-10-CM | POA: Diagnosis not present

## 2021-06-25 DIAGNOSIS — E1169 Type 2 diabetes mellitus with other specified complication: Secondary | ICD-10-CM | POA: Diagnosis not present

## 2021-06-26 DIAGNOSIS — M6281 Muscle weakness (generalized): Secondary | ICD-10-CM | POA: Diagnosis not present

## 2021-06-26 DIAGNOSIS — E785 Hyperlipidemia, unspecified: Secondary | ICD-10-CM | POA: Diagnosis not present

## 2021-06-26 DIAGNOSIS — R2689 Other abnormalities of gait and mobility: Secondary | ICD-10-CM | POA: Diagnosis not present

## 2021-06-26 DIAGNOSIS — I110 Hypertensive heart disease with heart failure: Secondary | ICD-10-CM | POA: Diagnosis not present

## 2021-06-26 DIAGNOSIS — E1169 Type 2 diabetes mellitus with other specified complication: Secondary | ICD-10-CM | POA: Diagnosis not present

## 2021-06-26 DIAGNOSIS — M6259 Muscle wasting and atrophy, not elsewhere classified, multiple sites: Secondary | ICD-10-CM | POA: Diagnosis not present

## 2021-06-28 DIAGNOSIS — N390 Urinary tract infection, site not specified: Secondary | ICD-10-CM | POA: Diagnosis not present

## 2021-06-28 DIAGNOSIS — E119 Type 2 diabetes mellitus without complications: Secondary | ICD-10-CM | POA: Diagnosis not present

## 2021-06-28 DIAGNOSIS — I10 Essential (primary) hypertension: Secondary | ICD-10-CM | POA: Diagnosis not present

## 2021-07-19 ENCOUNTER — Encounter: Payer: Self-pay | Admitting: Psychiatry

## 2021-07-19 ENCOUNTER — Ambulatory Visit (INDEPENDENT_AMBULATORY_CARE_PROVIDER_SITE_OTHER): Payer: Medicare Other | Admitting: Psychiatry

## 2021-07-19 DIAGNOSIS — G301 Alzheimer's disease with late onset: Secondary | ICD-10-CM | POA: Diagnosis not present

## 2021-07-19 DIAGNOSIS — F411 Generalized anxiety disorder: Secondary | ICD-10-CM

## 2021-07-19 DIAGNOSIS — F5105 Insomnia due to other mental disorder: Secondary | ICD-10-CM

## 2021-07-19 DIAGNOSIS — F02818 Dementia in other diseases classified elsewhere, unspecified severity, with other behavioral disturbance: Secondary | ICD-10-CM

## 2021-07-19 DIAGNOSIS — F422 Mixed obsessional thoughts and acts: Secondary | ICD-10-CM

## 2021-07-19 DIAGNOSIS — F200 Paranoid schizophrenia: Secondary | ICD-10-CM

## 2021-07-19 NOTE — Progress Notes (Signed)
Ricky [name] [id number] 1940/06/21 81 y.o.     Subjective:   Patient ID:  Ricky [name]. is a 81 y.o. (DOB [birth year]) male.  Chief Complaint:  Chief Complaint  Patient presents with   Follow-up    Schizophrenia, paranoid ([place])   Anxiety   Hallucinations   Memory Loss    Depression        Associated symptoms include decreased concentration and fatigue.  Associated symptoms include no headaches and no suicidal ideas.  Past medical history includes anxiety.   Anxiety Symptoms include confusion, decreased concentration, dizziness and nervous/anxious behavior. Patient reports no palpitations or suicidal ideas.     [name]. presents to the office today for follow-up of schizophrenia and dementia.  Resident of [facility].  seen April 15, 2019.  No meds were changed.  As of 06/03/2019 the following is noted: Staff reports patient calls very frequently and has for an extended period of time.  He is chronically somewhat needy and lonely as well as has a tendency for reassurance checking. Still bothered by religious thoughts and can't tell if it's from God than or Satan.  Supernatural things get in his mind.  Also thinks that people there don't like him at [facility].  Also upset at Biden's spending excess.  Wants to vote for [name].  Also bothered he doesn't get to do much.    Had thoughts he needed to go to the hospital one night for [place] reasons but staff encouraged him to wait it out and he felt better the next day. Poor STM. No med changes  9/20/21appt with the following noted: Worries over losing trusted banker.  Worries money is running out.  Worries about getting Covid.  Memory is worse.  Gets obsessive fears about his salvation especially  Listening to [place] radio at times but has chronically worried over it.   Hears voices talking about his mental health and to listen to radio.   Don't have a whole lot of them but enough to get him upset  from time to time.. Not markedly depressed. Plan no med changes  02/21/2020 appointment with the following noted: [place] some noise, ring in his head but not voices. Worries about it. They take good care of me over there.  Listens to radio a lot at night. And doesn't get a lot of sleep at night but will nap daytime. Reads bible but often doesn't understand or remember it. No SE. Calls on [facility] to help him emotionally and spiritually.   LTM good.  More poor STM.    Wonders if it's possible that he'll ever get well.    Watched NFL football yesterday and enjoyed it. Plan: No med changes  07/19/2020 appointment with the following noted: Has had to call couple times after hours bc was ruminating on past issues.  Today feels OK. Recognizes problems with STM but in his heart feels it will get better. Food good at [employer].  People are treating him well except with one man.  [name] avoids him. Still on olanzapine 25 mg HS and fluovxamine 100, zolpidem 5 HS, Xanax 0.25 mg prn. Can't afford caregiver to take him out daily as in the past. Wonders what doc thinks his IQ would have been.   No concerns about med except wants one to make him well.  01/18/2021 appt noted: Still at [facility].  Feels OK about it.   Still ups and downs but overall holding his own.  Doesn't  feel 81 yo.  WC bound but can still move himself around independently.  U incontinence. Getting along pretty well with people, but times thinks people don't like him.  Still wonders if he needs psych hospitalization. Notices memory problems. Still worries chronically over his salvation but not continuous and anxiety is no worse. Voices are better but still has intermittent AH.   CO trouble staying asleep but at times can sleep too much No SE noted Doing PT to try and get stronger. Plan: No med changes today. Successfully reduced alprazolam to 0.25 mg HS Continue olanzapine 25 mg HS for psychosis Continue fluvoxamine 100 mg HS for  OCD and anxiety Continue zolpidem 5 mg HS for sleep Consider stopping alprazolam 0.25 mg HS as long as he's sleeping well. Or reducing to 0.125 mg HS.  Dose is already low  07/19/2021 appointment with the following noted: Incontinent of urine in the office. He thinks overall he is doing better.  Not afraid at all.  Some worry.   Realizes he's here for 6 mos followup. But he does recognize some memory problems.  Sometimes has trouble with use of technology. Not much re: voices except hears noises at times that disturb him.   Not depressed.  Enjoys bingo. Asks for reassurance. No med concerns.  Past Psychiatric Medication Trials:  some of them are unknown,  haloperidol, Serentil, Stelazine, Moban, perphenazine, risperidone, Thorazine, Seroquel 800 mg, loxapine, been on Zyprexa since September 2006 and that has produced the best control of his paranoia at 25 mg daily. benztropine,    paroxetine, sertraline side effects,   Hydroxyzine, trazodone no response,  Xanax, zolpidem.  Review of Systems:  Review of Systems  Constitutional:  Positive for fatigue.  HENT:  Positive for tinnitus.   Cardiovascular:  Negative for palpitations.  Genitourinary:  Positive for enuresis. Negative for flank pain.  Musculoskeletal:  Positive for arthralgias, back pain and gait problem.  Neurological:  Positive for dizziness, tremors and weakness. Negative for headaches.       WC bound  Psychiatric/Behavioral:  Positive for confusion, decreased concentration, hallucinations and sleep disturbance. Negative for agitation, self-injury and suicidal ideas. The patient is nervous/anxious. The patient is not hyperactive.     Medications: I have reviewed the patient's current medications.  Current Outpatient Medications  Medication Sig Dispense Refill   ALPRAZolam (XANAX) 0.25 MG tablet Take 0.125 mg by mouth at bedtime.     amLODipine (NORVASC) 2.5 MG tablet      amLODipine (NORVASC) 5 MG tablet Take 5 mg by  mouth daily.     Calcium Citrate-Vitamin D (CITRACAL + D PO) Take 1 tablet by mouth daily.     Cholecalciferol (VITAMIN D-3) 1000 units CAPS Take 1 capsule by mouth daily.     docusate sodium (COLACE) 100 MG capsule Take 100 mg by mouth 2 (two) times daily.     empagliflozin (JARDIANCE) 10 MG TABS tablet Take 10 mg by mouth daily. 2 tablet 0   fluvoxaMINE (LUVOX) 100 MG tablet Take 1 tablet (100 mg total) by mouth at bedtime. 2 tablet 0   furosemide (LASIX) 20 MG tablet Take 20 mg by mouth daily before breakfast.      HUMALOG KWIKPEN 100 UNIT/ML KiwkPen Inject 2-11 Units into the skin See admin instructions. Times: 0630 1130 1630  Scale: 101-150: 2 units 151-200: 3 units 201-250: 5 units 251-300: 7 units 301-350: 9 units >350: 11 units     hydrOXYzine (VISTARIL) 25 MG capsule  insulin detemir (LEVEMIR) 100 UNIT/ML injection Inject 0.3 mLs (30 Units total) into the skin at bedtime. (Patient taking differently: Inject 24 Units into the skin at bedtime.) 10 mL 11   insulin glargine (LANTUS) 100 UNIT/ML injection 25 u     insulin regular (NOVOLIN R) 100 units/mL injection ss     meloxicam (MOBIC) 15 MG tablet 1 tablet     Menthol, Topical Analgesic, (BIOFREEZE) 4 % GEL Apply topically.     metFORMIN (GLUCOPHAGE) 500 MG tablet 1 tablet with meals     Multiple Vitamins-Minerals (THERAGRAN-M PREMIER 50 PLUS PO) Take 1 tablet by mouth daily.     MYRBETRIQ 25 MG TB24 tablet Take 25 mg by mouth daily.      OLANZapine (ZYPREXA) 5 MG tablet Take 5 tablets (25 mg total) by mouth daily. 2 tablet 0   Omega-3 Fatty Acids (FISH OIL) 1200 MG CAPS      omeprazole (PRILOSEC) 40 MG capsule Take 40 mg by mouth daily.      polyethylene glycol powder (GLYCOLAX/MIRALAX) 17 GM/SCOOP powder Take 1 Container by mouth once.     pravastatin (PRAVACHOL) 40 MG tablet Take 40 mg by mouth daily after lunch.      tamsulosin (FLOMAX) 0.4 MG CAPS capsule Take 0.4 mg by mouth daily.      traMADol (ULTRAM) 50 MG  tablet Take 1 tablet (50 mg total) by mouth every 6 (six) hours as needed. 20 tablet 0   zolpidem (AMBIEN) 5 MG tablet Take 1 tablet (5 mg total) by mouth at bedtime. 2 tablet 0   No current facility-administered medications for this visit.    Medication Side Effects: Other: dry mouth  Allergies:  Allergies  Allergen Reactions   Asa [Aspirin]     "Dr told me not to take it"   Codeine Other (See Comments)    DIZZINESS with Tylenol 3   Tylenol With Codeine #3 [Acetaminophen-Codeine]     Other reaction(s): Unknown   Tylenol [Acetaminophen] Other (See Comments)    Dizziness with tylenol 3    Past Medical History:  Diagnosis Date   Abnormality of gait    Adenomatous colon polyp    Arthritis    Cataract    Dementia ([place])    Depression    Diabetes mellitus without complication ([place])    Fatty liver    Hyperlipidemia    Hypertension    Internal hemorrhoids    Other malaise and fatigue    Peripheral edema    Schizophrenia ([place])    Type II or unspecified type diabetes mellitus without mention of complication, not stated as uncontrolled    Urinary frequency    Urinary retention     Family History  Problem Relation Age of Onset   Brain cancer Father     Social History   Socioeconomic History   Marital status: Single    Spouse name: Not on file   Number of children: Not on file   Years of education: college   Highest education level: Not on file  Occupational History    Employer: RETIRED    Comment: Disabled  Tobacco Use   Smoking status: Never   Smokeless tobacco: Never  Substance and Sexual Activity   Alcohol use: No   Drug use: No   Sexual activity: Not on file  Other Topics Concern   Not on file  Social History Narrative   Patient is disabled and he has not worked since 1962. Patient has some college education.Patient drinks  2 cups of coffee daily.   Right handed.   Social Determinants of Health   Financial Resource Strain: Not on file  Food Insecurity:  Not on file  Transportation Needs: Not on file  Physical Activity: Not on file  Stress: Not on file  Social Connections: Not on file  Intimate Partner Violence: Not on file    Past Medical History, Surgical history, Social history, and Family history were reviewed and updated as appropriate.   Please see review of systems for further details on the patient's review from today.   Objective:   Physical Exam:  There were no vitals taken for this visit.  Physical Exam Constitutional:      Appearance: He is obese.  Neurological:     Mental Status: He is alert and oriented to person, place, and time.     Cranial Nerves: Dysarthria present.     Motor: Weakness present.     Gait: Gait abnormal.     Comments: Mild dysarthria chronic   Psychiatric:        Attention and Perception: Attention normal. He is attentive. He perceives auditory hallucinations. He does not perceive visual hallucinations.        Mood and Affect: Mood is anxious. Mood is not depressed. Affect is not angry or tearful.        Speech: Speech normal. Speech is not rapid and pressured or slurred.        Behavior: Behavior is slowed. Behavior is not agitated or aggressive. Behavior is cooperative.        Thought Content: Thought content is paranoid and delusional. Thought content does not include homicidal or suicidal ideation. Thought content does not include homicidal or suicidal plan.        Cognition and Memory: Cognition is impaired. Memory is impaired. He does not exhibit impaired recent memory.     Comments: Chronic voices better. Fair insight and judgment. Talkative and repeats himself some. Memory is stable.Braxton Feathers words at times Reassurance seeking chronically. Some IOR re commericials on TV ongoing not disturbing. Obsessive spiritual thought and compulsive behaviors are less prominent  Less paranoid than usual. AH is just a noise not voice when he does something he feels guilty about. Affect calm.       Lab Review:     Component Value Date/Time   NA 139 01/22/2018 0339   K 3.6 01/22/2018 0339   CL 100 01/22/2018 0339   CO2 28 01/22/2018 0339   GLUCOSE 231 (H) 01/22/2018 0339   BUN 15 01/22/2018 0339   CREATININE 0.96 01/22/2018 0339   CALCIUM 9.2 01/22/2018 0339   PROT 7.0 01/22/2018 0339   ALBUMIN 3.3 (L) 01/22/2018 0339   AST 35 01/22/2018 0339   ALT 38 01/22/2018 0339   ALKPHOS 71 01/22/2018 0339   BILITOT 0.6 01/22/2018 0339   GFRNONAA >60 01/22/2018 0339   GFRAA >60 01/22/2018 0339       Component Value Date/Time   WBC 6.8 01/22/2018 0339   RBC 4.48 01/22/2018 0339   HGB 12.9 (L) 01/22/2018 0339   HCT 41.7 01/22/2018 0339   PLT 211 01/22/2018 0339   MCV 93.1 01/22/2018 0339   MCH 28.8 01/22/2018 0339   MCHC 30.9 01/22/2018 0339   RDW 14.0 01/22/2018 0339   LYMPHSABS 1.8 01/22/2018 0339   MONOABS 0.6 01/22/2018 0339   EOSABS 0.3 01/22/2018 0339   BASOSABS 0.1 01/22/2018 0339    No results found for: "POCLITH", "LITHIUM"   No results found for: "  PHENYTOIN", "PHENOBARB", "VALPROATE", "CBMZ"   .res Assessment: Plan:    Myan was seen today for follow-up, anxiety, hallucinations and memory loss.  Diagnoses and all orders for this visit:  Schizophrenia, paranoid ([place])  Mixed obsessional thoughts and acts  Generalized anxiety disorder  Late onset Alzheimer's disease with behavioral disturbance ([place])  Insomnia due to mental condition   Greater than 50% of 30 min face to face time with patient was spent on counseling and coordination of care. We discussed several topics as noted: Reviewed documents today from [facility] including med list. Med reconciliation.  Patient has been under my psychiatric years since 1998 .unlikely that med change will reduce the paranoia and auditory hallucinations which are chronic. Voices are better and paranoia appears a little better but varies.  Reports still cooperative with staff.  He has been on multiple  psychiatric medications and has done best on this combination of Zyprexa which was increased to 25 mg April 09, 2018, a minimal dose of alprazolam 0.25 mg HS, and fluvoxamine 100 mg daily. Is cooperative genereally.   He also still has obsessive and intrusive fears about losing his salvation but they are chronic but less severe at present.  Given his age and the chronicity of the symptoms med changes are unlikely to help.  Overall it appears a little better at present.  Chronic reassurance seeking.  Calls people for reassurance. Overall also paranoia is better than other times.  Needs high dose olanzapine.  Supportive therapy dealing with chronic paranoia and isolation from Covid.  Encourage his spirituality as a coping mechanism.  Needs a lot of encouragement.  Repetitively asks for reassurance. Needs reassurance he does not need to go to the hospital. He is making fewer after hours phone calls to our office than historically has been done.  Weaker and needing more physical assistance.  WC bound    Discussed potential metabolic side effects associated with atypical antipsychotics, as well as potential risk for movement side effects. Advised pt to contact office if movement side effects occur.   Disc he's taking more than the usuall highest dose of olanzapine but is medically necessary for paranoia. No AIM.  No med changes today. Successfully reduced alprazolam to 0.25 mg HS Continue olanzapine 25 mg HS for psychosis Continue fluvoxamine 100 mg HS for OCD and anxiety Continue zolpidem 5 mg HS for sleep Consider stopping alprazolam 0.25 mg HS as long as he's sleeping well. Or reducing to 0.125 mg HS.  Dose is already low  25 min appt  FU 6 mos  Lynder Parents, MD, DFAPA .    Future Appointments  Date Time Provider [place]  08/31/2021  9:30 AM Gardiner Barefoot, DPM [facility]    No orders of the defined types were placed in this encounter.       -------------------------------

## 2021-07-22 DIAGNOSIS — K219 Gastro-esophageal reflux disease without esophagitis: Secondary | ICD-10-CM | POA: Diagnosis not present

## 2021-07-22 DIAGNOSIS — I10 Essential (primary) hypertension: Secondary | ICD-10-CM | POA: Diagnosis not present

## 2021-07-23 DIAGNOSIS — I10 Essential (primary) hypertension: Secondary | ICD-10-CM | POA: Diagnosis not present

## 2021-07-23 DIAGNOSIS — K219 Gastro-esophageal reflux disease without esophagitis: Secondary | ICD-10-CM | POA: Diagnosis not present

## 2021-08-01 DIAGNOSIS — E1122 Type 2 diabetes mellitus with diabetic chronic kidney disease: Secondary | ICD-10-CM | POA: Diagnosis not present

## 2021-08-02 DIAGNOSIS — D649 Anemia, unspecified: Secondary | ICD-10-CM | POA: Diagnosis not present

## 2021-08-06 DIAGNOSIS — I10 Essential (primary) hypertension: Secondary | ICD-10-CM | POA: Diagnosis not present

## 2021-08-06 DIAGNOSIS — K219 Gastro-esophageal reflux disease without esophagitis: Secondary | ICD-10-CM | POA: Diagnosis not present

## 2021-08-06 DIAGNOSIS — G4709 Other insomnia: Secondary | ICD-10-CM | POA: Diagnosis not present

## 2021-08-09 DIAGNOSIS — H538 Other visual disturbances: Secondary | ICD-10-CM | POA: Diagnosis not present

## 2021-08-09 DIAGNOSIS — H40013 Open angle with borderline findings, low risk, bilateral: Secondary | ICD-10-CM | POA: Diagnosis not present

## 2021-08-09 DIAGNOSIS — E119 Type 2 diabetes mellitus without complications: Secondary | ICD-10-CM | POA: Diagnosis not present

## 2021-08-09 DIAGNOSIS — H35033 Hypertensive retinopathy, bilateral: Secondary | ICD-10-CM | POA: Diagnosis not present

## 2021-08-18 DIAGNOSIS — K5909 Other constipation: Secondary | ICD-10-CM | POA: Diagnosis not present

## 2021-08-18 DIAGNOSIS — K219 Gastro-esophageal reflux disease without esophagitis: Secondary | ICD-10-CM | POA: Diagnosis not present

## 2021-08-18 DIAGNOSIS — I10 Essential (primary) hypertension: Secondary | ICD-10-CM | POA: Diagnosis not present

## 2021-08-22 DIAGNOSIS — E119 Type 2 diabetes mellitus without complications: Secondary | ICD-10-CM | POA: Diagnosis not present

## 2021-08-22 DIAGNOSIS — I10 Essential (primary) hypertension: Secondary | ICD-10-CM | POA: Diagnosis not present

## 2021-08-31 ENCOUNTER — Ambulatory Visit (INDEPENDENT_AMBULATORY_CARE_PROVIDER_SITE_OTHER): Payer: Medicare Other | Admitting: Podiatry

## 2021-08-31 ENCOUNTER — Encounter: Payer: Self-pay | Admitting: Podiatry

## 2021-08-31 DIAGNOSIS — M79609 Pain in unspecified limb: Secondary | ICD-10-CM

## 2021-08-31 DIAGNOSIS — B351 Tinea unguium: Secondary | ICD-10-CM

## 2021-08-31 DIAGNOSIS — E1159 Type 2 diabetes mellitus with other circulatory complications: Secondary | ICD-10-CM | POA: Diagnosis not present

## 2021-08-31 DIAGNOSIS — K219 Gastro-esophageal reflux disease without esophagitis: Secondary | ICD-10-CM | POA: Diagnosis not present

## 2021-08-31 DIAGNOSIS — M2041 Other hammer toe(s) (acquired), right foot: Secondary | ICD-10-CM | POA: Diagnosis not present

## 2021-08-31 DIAGNOSIS — M2042 Other hammer toe(s) (acquired), left foot: Secondary | ICD-10-CM

## 2021-08-31 DIAGNOSIS — I10 Essential (primary) hypertension: Secondary | ICD-10-CM | POA: Diagnosis not present

## 2021-08-31 NOTE — Progress Notes (Signed)
This patient returns to my office for at risk foot care.  This patient requires this care by a professional since this patient will be at risk due to having CKD and diabetes.  This patient is unable to cut nails himself since the patient cannot reach his nails.These nails are painful walking and wearing shoes. This patient presents to the office in a wheelchair.  This patient presents for at risk foot care today.  General Appearance  Alert, conversant and in no acute stress.  Vascular  Dorsalis pedis and posterior tibial  pulses are weakly  palpable  bilaterally.  Capillary return is within normal limits  bilaterally. Cold feet  Bilaterally. Absent digital hair B/L.  Neurologic  Senn-Weinstein monofilament wire test diminished   bilaterally. Muscle power within normal limits bilaterally.  Nails Thick disfigured discolored nails with subungual debris  from hallux to fifth toes bilaterally. No evidence of bacterial infection or drainage bilaterally.  Orthopedic  No limitations of motion  feet .  No crepitus or effusions noted.  No bony pathology or digital deformities noted.  Skin  normotropic skin with no porokeratosis noted bilaterally.  No signs of infections or ulcers noted.     Onychomycosis  Pain in right toes  Pain in left toes  Consent was obtained for treatment procedures.   Mechanical debridement of nails 1-5  bilaterally performed with a nail nipper.    RTC 3 months.     Return office visit   3 months                  Told patient to return for periodic foot care and evaluation due to potential at risk complications.   Gardiner Barefoot DPM

## 2021-09-08 DIAGNOSIS — K5909 Other constipation: Secondary | ICD-10-CM | POA: Diagnosis not present

## 2021-09-08 DIAGNOSIS — K219 Gastro-esophageal reflux disease without esophagitis: Secondary | ICD-10-CM | POA: Diagnosis not present

## 2021-09-08 DIAGNOSIS — R600 Localized edema: Secondary | ICD-10-CM | POA: Diagnosis not present

## 2021-09-12 DIAGNOSIS — E1122 Type 2 diabetes mellitus with diabetic chronic kidney disease: Secondary | ICD-10-CM | POA: Diagnosis not present

## 2021-09-13 DIAGNOSIS — R2681 Unsteadiness on feet: Secondary | ICD-10-CM | POA: Diagnosis not present

## 2021-09-13 DIAGNOSIS — M6281 Muscle weakness (generalized): Secondary | ICD-10-CM | POA: Diagnosis not present

## 2021-09-13 DIAGNOSIS — E785 Hyperlipidemia, unspecified: Secondary | ICD-10-CM | POA: Diagnosis not present

## 2021-09-13 DIAGNOSIS — I110 Hypertensive heart disease with heart failure: Secondary | ICD-10-CM | POA: Diagnosis not present

## 2021-09-13 DIAGNOSIS — E1169 Type 2 diabetes mellitus with other specified complication: Secondary | ICD-10-CM | POA: Diagnosis not present

## 2021-09-13 DIAGNOSIS — R2689 Other abnormalities of gait and mobility: Secondary | ICD-10-CM | POA: Diagnosis not present

## 2021-09-13 DIAGNOSIS — M6259 Muscle wasting and atrophy, not elsewhere classified, multiple sites: Secondary | ICD-10-CM | POA: Diagnosis not present

## 2021-09-14 DIAGNOSIS — R2689 Other abnormalities of gait and mobility: Secondary | ICD-10-CM | POA: Diagnosis not present

## 2021-09-14 DIAGNOSIS — E785 Hyperlipidemia, unspecified: Secondary | ICD-10-CM | POA: Diagnosis not present

## 2021-09-14 DIAGNOSIS — R2681 Unsteadiness on feet: Secondary | ICD-10-CM | POA: Diagnosis not present

## 2021-09-14 DIAGNOSIS — M6259 Muscle wasting and atrophy, not elsewhere classified, multiple sites: Secondary | ICD-10-CM | POA: Diagnosis not present

## 2021-09-14 DIAGNOSIS — E1169 Type 2 diabetes mellitus with other specified complication: Secondary | ICD-10-CM | POA: Diagnosis not present

## 2021-09-14 DIAGNOSIS — I110 Hypertensive heart disease with heart failure: Secondary | ICD-10-CM | POA: Diagnosis not present

## 2021-09-14 DIAGNOSIS — M6281 Muscle weakness (generalized): Secondary | ICD-10-CM | POA: Diagnosis not present

## 2021-09-17 DIAGNOSIS — R2681 Unsteadiness on feet: Secondary | ICD-10-CM | POA: Diagnosis not present

## 2021-09-17 DIAGNOSIS — R2689 Other abnormalities of gait and mobility: Secondary | ICD-10-CM | POA: Diagnosis not present

## 2021-09-17 DIAGNOSIS — M6259 Muscle wasting and atrophy, not elsewhere classified, multiple sites: Secondary | ICD-10-CM | POA: Diagnosis not present

## 2021-09-17 DIAGNOSIS — E785 Hyperlipidemia, unspecified: Secondary | ICD-10-CM | POA: Diagnosis not present

## 2021-09-17 DIAGNOSIS — M6281 Muscle weakness (generalized): Secondary | ICD-10-CM | POA: Diagnosis not present

## 2021-09-17 DIAGNOSIS — E1169 Type 2 diabetes mellitus with other specified complication: Secondary | ICD-10-CM | POA: Diagnosis not present

## 2021-09-17 DIAGNOSIS — I110 Hypertensive heart disease with heart failure: Secondary | ICD-10-CM | POA: Diagnosis not present

## 2021-09-18 DIAGNOSIS — I110 Hypertensive heart disease with heart failure: Secondary | ICD-10-CM | POA: Diagnosis not present

## 2021-09-18 DIAGNOSIS — M6259 Muscle wasting and atrophy, not elsewhere classified, multiple sites: Secondary | ICD-10-CM | POA: Diagnosis not present

## 2021-09-18 DIAGNOSIS — E785 Hyperlipidemia, unspecified: Secondary | ICD-10-CM | POA: Diagnosis not present

## 2021-09-18 DIAGNOSIS — E1169 Type 2 diabetes mellitus with other specified complication: Secondary | ICD-10-CM | POA: Diagnosis not present

## 2021-09-18 DIAGNOSIS — M6281 Muscle weakness (generalized): Secondary | ICD-10-CM | POA: Diagnosis not present

## 2021-09-18 DIAGNOSIS — R2689 Other abnormalities of gait and mobility: Secondary | ICD-10-CM | POA: Diagnosis not present

## 2021-09-18 DIAGNOSIS — R2681 Unsteadiness on feet: Secondary | ICD-10-CM | POA: Diagnosis not present

## 2021-09-19 ENCOUNTER — Ambulatory Visit: Payer: Self-pay

## 2021-09-19 DIAGNOSIS — R2681 Unsteadiness on feet: Secondary | ICD-10-CM | POA: Diagnosis not present

## 2021-09-19 DIAGNOSIS — I110 Hypertensive heart disease with heart failure: Secondary | ICD-10-CM | POA: Diagnosis not present

## 2021-09-19 DIAGNOSIS — E1169 Type 2 diabetes mellitus with other specified complication: Secondary | ICD-10-CM | POA: Diagnosis not present

## 2021-09-19 DIAGNOSIS — M6281 Muscle weakness (generalized): Secondary | ICD-10-CM | POA: Diagnosis not present

## 2021-09-19 DIAGNOSIS — E785 Hyperlipidemia, unspecified: Secondary | ICD-10-CM | POA: Diagnosis not present

## 2021-09-19 DIAGNOSIS — M6259 Muscle wasting and atrophy, not elsewhere classified, multiple sites: Secondary | ICD-10-CM | POA: Diagnosis not present

## 2021-09-19 DIAGNOSIS — R2689 Other abnormalities of gait and mobility: Secondary | ICD-10-CM | POA: Diagnosis not present

## 2021-09-19 NOTE — Patient Outreach (Signed)
  Care Coordination   09/19/2021 Name: Ricky [name]. MRN: [id number] DOB: 16-Mar-1940   Care Coordination Outreach Attempts:  An unsuccessful telephone outreach was attempted today to offer the patient information about available care coordination services as a benefit of their health plan.   Follow Up Plan:  Additional outreach attempts will be made to offer the patient care coordination information and services.   Encounter Outcome:  No Answer  Care Coordination Interventions Activated:  No   Care Coordination Interventions:  No, not indicated    [facility] [facility] [phone]

## 2021-09-23 DIAGNOSIS — K219 Gastro-esophageal reflux disease without esophagitis: Secondary | ICD-10-CM | POA: Diagnosis not present

## 2021-09-23 DIAGNOSIS — E119 Type 2 diabetes mellitus without complications: Secondary | ICD-10-CM | POA: Diagnosis not present

## 2021-09-23 DIAGNOSIS — I10 Essential (primary) hypertension: Secondary | ICD-10-CM | POA: Diagnosis not present

## 2021-09-27 DIAGNOSIS — I10 Essential (primary) hypertension: Secondary | ICD-10-CM | POA: Diagnosis not present

## 2021-09-27 DIAGNOSIS — K219 Gastro-esophageal reflux disease without esophagitis: Secondary | ICD-10-CM | POA: Diagnosis not present

## 2021-09-27 DIAGNOSIS — E1169 Type 2 diabetes mellitus with other specified complication: Secondary | ICD-10-CM | POA: Diagnosis not present

## 2021-09-29 DIAGNOSIS — K219 Gastro-esophageal reflux disease without esophagitis: Secondary | ICD-10-CM | POA: Diagnosis not present

## 2021-09-29 DIAGNOSIS — K5909 Other constipation: Secondary | ICD-10-CM | POA: Diagnosis not present

## 2021-10-12 ENCOUNTER — Telehealth: Payer: Self-pay

## 2021-10-12 NOTE — Patient Outreach (Signed)
  Care Coordination   Initial Visit Note   10/12/2021 Name: Ricky [name]. MRN: [id number] DOB: 09-Feb-1940  Ricky [name]. is a 81 y.o. year old male who sees Polite, Jori Moll, MD for primary care. I spoke with  [name].  And staff at [facility] by phone today.  What matters to the patients health and wellness today?  No Concerns Expressed. Confirmed that patient is a long term resident at [facility].    Goals Addressed   None     SDOH assessments and interventions completed:  No     Care Coordination Interventions Activated:  No  Care Coordination Interventions:  No, not indicated   Follow up plan: No further intervention required.   Encounter Outcome:  Pt. Visit Completed   [place] [facility] [phone]

## 2021-10-26 DIAGNOSIS — I10 Essential (primary) hypertension: Secondary | ICD-10-CM | POA: Diagnosis not present

## 2021-10-26 DIAGNOSIS — E119 Type 2 diabetes mellitus without complications: Secondary | ICD-10-CM | POA: Diagnosis not present

## 2021-10-26 DIAGNOSIS — K219 Gastro-esophageal reflux disease without esophagitis: Secondary | ICD-10-CM | POA: Diagnosis not present

## 2021-11-01 DIAGNOSIS — Z79899 Other long term (current) drug therapy: Secondary | ICD-10-CM | POA: Diagnosis not present

## 2021-11-02 DIAGNOSIS — G4709 Other insomnia: Secondary | ICD-10-CM | POA: Diagnosis not present

## 2021-11-02 DIAGNOSIS — E119 Type 2 diabetes mellitus without complications: Secondary | ICD-10-CM | POA: Diagnosis not present

## 2021-11-02 DIAGNOSIS — N3281 Overactive bladder: Secondary | ICD-10-CM | POA: Diagnosis not present

## 2021-11-02 DIAGNOSIS — I10 Essential (primary) hypertension: Secondary | ICD-10-CM | POA: Diagnosis not present

## 2021-11-07 DIAGNOSIS — Z79899 Other long term (current) drug therapy: Secondary | ICD-10-CM | POA: Diagnosis not present

## 2021-11-09 DIAGNOSIS — E119 Type 2 diabetes mellitus without complications: Secondary | ICD-10-CM | POA: Diagnosis not present

## 2021-11-09 DIAGNOSIS — N390 Urinary tract infection, site not specified: Secondary | ICD-10-CM | POA: Diagnosis not present

## 2021-11-09 DIAGNOSIS — I10 Essential (primary) hypertension: Secondary | ICD-10-CM | POA: Diagnosis not present

## 2021-11-13 DIAGNOSIS — N390 Urinary tract infection, site not specified: Secondary | ICD-10-CM | POA: Diagnosis not present

## 2021-11-13 DIAGNOSIS — G4709 Other insomnia: Secondary | ICD-10-CM | POA: Diagnosis not present

## 2021-11-13 DIAGNOSIS — N3281 Overactive bladder: Secondary | ICD-10-CM | POA: Diagnosis not present

## 2021-11-13 DIAGNOSIS — I10 Essential (primary) hypertension: Secondary | ICD-10-CM | POA: Diagnosis not present

## 2021-11-21 DIAGNOSIS — E119 Type 2 diabetes mellitus without complications: Secondary | ICD-10-CM | POA: Diagnosis not present

## 2021-11-21 DIAGNOSIS — I10 Essential (primary) hypertension: Secondary | ICD-10-CM | POA: Diagnosis not present

## 2021-11-21 DIAGNOSIS — N3281 Overactive bladder: Secondary | ICD-10-CM | POA: Diagnosis not present

## 2021-11-25 DIAGNOSIS — E1169 Type 2 diabetes mellitus with other specified complication: Secondary | ICD-10-CM | POA: Diagnosis not present

## 2021-11-25 DIAGNOSIS — I10 Essential (primary) hypertension: Secondary | ICD-10-CM | POA: Diagnosis not present

## 2021-11-25 DIAGNOSIS — K5909 Other constipation: Secondary | ICD-10-CM | POA: Diagnosis not present

## 2021-11-25 DIAGNOSIS — K219 Gastro-esophageal reflux disease without esophagitis: Secondary | ICD-10-CM | POA: Diagnosis not present

## 2021-11-29 DIAGNOSIS — I10 Essential (primary) hypertension: Secondary | ICD-10-CM | POA: Diagnosis not present

## 2021-11-29 DIAGNOSIS — E119 Type 2 diabetes mellitus without complications: Secondary | ICD-10-CM | POA: Diagnosis not present

## 2021-11-29 DIAGNOSIS — K219 Gastro-esophageal reflux disease without esophagitis: Secondary | ICD-10-CM | POA: Diagnosis not present

## 2021-12-01 DIAGNOSIS — K219 Gastro-esophageal reflux disease without esophagitis: Secondary | ICD-10-CM | POA: Diagnosis not present

## 2021-12-01 DIAGNOSIS — I10 Essential (primary) hypertension: Secondary | ICD-10-CM | POA: Diagnosis not present

## 2021-12-04 DIAGNOSIS — I10 Essential (primary) hypertension: Secondary | ICD-10-CM | POA: Diagnosis not present

## 2021-12-04 DIAGNOSIS — E119 Type 2 diabetes mellitus without complications: Secondary | ICD-10-CM | POA: Diagnosis not present

## 2021-12-04 DIAGNOSIS — N3281 Overactive bladder: Secondary | ICD-10-CM | POA: Diagnosis not present

## 2021-12-07 ENCOUNTER — Ambulatory Visit (INDEPENDENT_AMBULATORY_CARE_PROVIDER_SITE_OTHER): Payer: Medicare Other | Admitting: Podiatry

## 2021-12-07 DIAGNOSIS — M79609 Pain in unspecified limb: Secondary | ICD-10-CM

## 2021-12-07 DIAGNOSIS — M2042 Other hammer toe(s) (acquired), left foot: Secondary | ICD-10-CM

## 2021-12-07 DIAGNOSIS — B351 Tinea unguium: Secondary | ICD-10-CM

## 2021-12-07 DIAGNOSIS — M2041 Other hammer toe(s) (acquired), right foot: Secondary | ICD-10-CM

## 2021-12-07 DIAGNOSIS — E1159 Type 2 diabetes mellitus with other circulatory complications: Secondary | ICD-10-CM

## 2021-12-07 NOTE — Progress Notes (Addendum)
This patient returns to my office for at risk foot care.  This patient requires this care by a professional since this patient will be at risk due to having CKD and diabetes.  This patient is unable to cut nails himself since the patient cannot reach his nails.These nails are painful walking and wearing shoes. This patient presents to the office in a wheelchair.  This patient presents for at risk foot care today.  General Appearance  Alert, conversant and in no acute stress.  Vascular  Dorsalis pedis and posterior tibial  pulses are weakly  palpable  bilaterally.  Capillary return is within normal limits  bilaterally. Cold feet  Bilaterally. Absent digital hair B/L.  Neurologic  Senn-Weinstein monofilament wire test diminished   bilaterally. Muscle power within normal limits bilaterally.  Nails Thick disfigured discolored nails with subungual debris  from hallux to fifth toes bilaterally. No evidence of bacterial infection or drainage bilaterally.  Orthopedic  No limitations of motion  feet .  No crepitus or effusions noted.  Contracted digits  B/L.  Skin  normotropic skin with no porokeratosis noted bilaterally.  No signs of infections or ulcers noted.     Onychomycosis  Pain in right toes  Pain in left toes  Consent was obtained for treatment procedures.   Mechanical debridement of nails 1-5  bilaterally performed with a nail nipper.    RTC 3 months.     Return office visit   3 months                  Told patient to return for periodic foot care and evaluation due to potential at risk complications.   Kinnley Paulson DPM  

## 2021-12-16 DIAGNOSIS — K5909 Other constipation: Secondary | ICD-10-CM | POA: Diagnosis not present

## 2021-12-16 DIAGNOSIS — K219 Gastro-esophageal reflux disease without esophagitis: Secondary | ICD-10-CM | POA: Diagnosis not present

## 2021-12-16 DIAGNOSIS — I10 Essential (primary) hypertension: Secondary | ICD-10-CM | POA: Diagnosis not present

## 2021-12-20 DIAGNOSIS — E119 Type 2 diabetes mellitus without complications: Secondary | ICD-10-CM | POA: Diagnosis not present

## 2021-12-20 DIAGNOSIS — N3281 Overactive bladder: Secondary | ICD-10-CM | POA: Diagnosis not present

## 2021-12-20 DIAGNOSIS — I10 Essential (primary) hypertension: Secondary | ICD-10-CM | POA: Diagnosis not present

## 2021-12-25 DIAGNOSIS — I110 Hypertensive heart disease with heart failure: Secondary | ICD-10-CM | POA: Diagnosis not present

## 2021-12-25 DIAGNOSIS — M6281 Muscle weakness (generalized): Secondary | ICD-10-CM | POA: Diagnosis not present

## 2021-12-25 DIAGNOSIS — R2681 Unsteadiness on feet: Secondary | ICD-10-CM | POA: Diagnosis not present

## 2021-12-25 DIAGNOSIS — M6259 Muscle wasting and atrophy, not elsewhere classified, multiple sites: Secondary | ICD-10-CM | POA: Diagnosis not present

## 2021-12-25 DIAGNOSIS — E785 Hyperlipidemia, unspecified: Secondary | ICD-10-CM | POA: Diagnosis not present

## 2021-12-25 DIAGNOSIS — E1169 Type 2 diabetes mellitus with other specified complication: Secondary | ICD-10-CM | POA: Diagnosis not present

## 2021-12-25 DIAGNOSIS — R2689 Other abnormalities of gait and mobility: Secondary | ICD-10-CM | POA: Diagnosis not present

## 2021-12-26 DIAGNOSIS — M6259 Muscle wasting and atrophy, not elsewhere classified, multiple sites: Secondary | ICD-10-CM | POA: Diagnosis not present

## 2021-12-26 DIAGNOSIS — M6281 Muscle weakness (generalized): Secondary | ICD-10-CM | POA: Diagnosis not present

## 2021-12-26 DIAGNOSIS — R2689 Other abnormalities of gait and mobility: Secondary | ICD-10-CM | POA: Diagnosis not present

## 2021-12-26 DIAGNOSIS — E785 Hyperlipidemia, unspecified: Secondary | ICD-10-CM | POA: Diagnosis not present

## 2021-12-26 DIAGNOSIS — I110 Hypertensive heart disease with heart failure: Secondary | ICD-10-CM | POA: Diagnosis not present

## 2021-12-26 DIAGNOSIS — R2681 Unsteadiness on feet: Secondary | ICD-10-CM | POA: Diagnosis not present

## 2021-12-26 DIAGNOSIS — E1169 Type 2 diabetes mellitus with other specified complication: Secondary | ICD-10-CM | POA: Diagnosis not present

## 2021-12-27 DIAGNOSIS — R2681 Unsteadiness on feet: Secondary | ICD-10-CM | POA: Diagnosis not present

## 2021-12-27 DIAGNOSIS — E785 Hyperlipidemia, unspecified: Secondary | ICD-10-CM | POA: Diagnosis not present

## 2021-12-27 DIAGNOSIS — R2689 Other abnormalities of gait and mobility: Secondary | ICD-10-CM | POA: Diagnosis not present

## 2021-12-27 DIAGNOSIS — M6281 Muscle weakness (generalized): Secondary | ICD-10-CM | POA: Diagnosis not present

## 2021-12-27 DIAGNOSIS — I110 Hypertensive heart disease with heart failure: Secondary | ICD-10-CM | POA: Diagnosis not present

## 2021-12-27 DIAGNOSIS — M6259 Muscle wasting and atrophy, not elsewhere classified, multiple sites: Secondary | ICD-10-CM | POA: Diagnosis not present

## 2021-12-27 DIAGNOSIS — E1169 Type 2 diabetes mellitus with other specified complication: Secondary | ICD-10-CM | POA: Diagnosis not present

## 2021-12-28 DIAGNOSIS — R2689 Other abnormalities of gait and mobility: Secondary | ICD-10-CM | POA: Diagnosis not present

## 2021-12-28 DIAGNOSIS — E785 Hyperlipidemia, unspecified: Secondary | ICD-10-CM | POA: Diagnosis not present

## 2021-12-28 DIAGNOSIS — I110 Hypertensive heart disease with heart failure: Secondary | ICD-10-CM | POA: Diagnosis not present

## 2021-12-28 DIAGNOSIS — M6281 Muscle weakness (generalized): Secondary | ICD-10-CM | POA: Diagnosis not present

## 2021-12-28 DIAGNOSIS — E1169 Type 2 diabetes mellitus with other specified complication: Secondary | ICD-10-CM | POA: Diagnosis not present

## 2021-12-28 DIAGNOSIS — M6259 Muscle wasting and atrophy, not elsewhere classified, multiple sites: Secondary | ICD-10-CM | POA: Diagnosis not present

## 2021-12-28 DIAGNOSIS — E1122 Type 2 diabetes mellitus with diabetic chronic kidney disease: Secondary | ICD-10-CM | POA: Diagnosis not present

## 2021-12-28 DIAGNOSIS — R2681 Unsteadiness on feet: Secondary | ICD-10-CM | POA: Diagnosis not present

## 2021-12-31 DIAGNOSIS — E785 Hyperlipidemia, unspecified: Secondary | ICD-10-CM | POA: Diagnosis not present

## 2021-12-31 DIAGNOSIS — I110 Hypertensive heart disease with heart failure: Secondary | ICD-10-CM | POA: Diagnosis not present

## 2021-12-31 DIAGNOSIS — R2681 Unsteadiness on feet: Secondary | ICD-10-CM | POA: Diagnosis not present

## 2021-12-31 DIAGNOSIS — M6281 Muscle weakness (generalized): Secondary | ICD-10-CM | POA: Diagnosis not present

## 2021-12-31 DIAGNOSIS — R2689 Other abnormalities of gait and mobility: Secondary | ICD-10-CM | POA: Diagnosis not present

## 2021-12-31 DIAGNOSIS — M6259 Muscle wasting and atrophy, not elsewhere classified, multiple sites: Secondary | ICD-10-CM | POA: Diagnosis not present

## 2021-12-31 DIAGNOSIS — E1169 Type 2 diabetes mellitus with other specified complication: Secondary | ICD-10-CM | POA: Diagnosis not present

## 2022-01-01 DIAGNOSIS — E785 Hyperlipidemia, unspecified: Secondary | ICD-10-CM | POA: Diagnosis not present

## 2022-01-01 DIAGNOSIS — M6281 Muscle weakness (generalized): Secondary | ICD-10-CM | POA: Diagnosis not present

## 2022-01-01 DIAGNOSIS — R2681 Unsteadiness on feet: Secondary | ICD-10-CM | POA: Diagnosis not present

## 2022-01-01 DIAGNOSIS — E1169 Type 2 diabetes mellitus with other specified complication: Secondary | ICD-10-CM | POA: Diagnosis not present

## 2022-01-01 DIAGNOSIS — I110 Hypertensive heart disease with heart failure: Secondary | ICD-10-CM | POA: Diagnosis not present

## 2022-01-01 DIAGNOSIS — M6259 Muscle wasting and atrophy, not elsewhere classified, multiple sites: Secondary | ICD-10-CM | POA: Diagnosis not present

## 2022-01-01 DIAGNOSIS — R2689 Other abnormalities of gait and mobility: Secondary | ICD-10-CM | POA: Diagnosis not present

## 2022-01-02 DIAGNOSIS — R2689 Other abnormalities of gait and mobility: Secondary | ICD-10-CM | POA: Diagnosis not present

## 2022-01-02 DIAGNOSIS — M6281 Muscle weakness (generalized): Secondary | ICD-10-CM | POA: Diagnosis not present

## 2022-01-02 DIAGNOSIS — I110 Hypertensive heart disease with heart failure: Secondary | ICD-10-CM | POA: Diagnosis not present

## 2022-01-02 DIAGNOSIS — E1169 Type 2 diabetes mellitus with other specified complication: Secondary | ICD-10-CM | POA: Diagnosis not present

## 2022-01-02 DIAGNOSIS — M6259 Muscle wasting and atrophy, not elsewhere classified, multiple sites: Secondary | ICD-10-CM | POA: Diagnosis not present

## 2022-01-02 DIAGNOSIS — R2681 Unsteadiness on feet: Secondary | ICD-10-CM | POA: Diagnosis not present

## 2022-01-02 DIAGNOSIS — E785 Hyperlipidemia, unspecified: Secondary | ICD-10-CM | POA: Diagnosis not present

## 2022-01-03 DIAGNOSIS — E1169 Type 2 diabetes mellitus with other specified complication: Secondary | ICD-10-CM | POA: Diagnosis not present

## 2022-01-03 DIAGNOSIS — R2681 Unsteadiness on feet: Secondary | ICD-10-CM | POA: Diagnosis not present

## 2022-01-03 DIAGNOSIS — M6281 Muscle weakness (generalized): Secondary | ICD-10-CM | POA: Diagnosis not present

## 2022-01-03 DIAGNOSIS — I110 Hypertensive heart disease with heart failure: Secondary | ICD-10-CM | POA: Diagnosis not present

## 2022-01-03 DIAGNOSIS — R2689 Other abnormalities of gait and mobility: Secondary | ICD-10-CM | POA: Diagnosis not present

## 2022-01-03 DIAGNOSIS — M6259 Muscle wasting and atrophy, not elsewhere classified, multiple sites: Secondary | ICD-10-CM | POA: Diagnosis not present

## 2022-01-03 DIAGNOSIS — E785 Hyperlipidemia, unspecified: Secondary | ICD-10-CM | POA: Diagnosis not present

## 2022-01-04 DIAGNOSIS — E1169 Type 2 diabetes mellitus with other specified complication: Secondary | ICD-10-CM | POA: Diagnosis not present

## 2022-01-04 DIAGNOSIS — R2689 Other abnormalities of gait and mobility: Secondary | ICD-10-CM | POA: Diagnosis not present

## 2022-01-04 DIAGNOSIS — M6259 Muscle wasting and atrophy, not elsewhere classified, multiple sites: Secondary | ICD-10-CM | POA: Diagnosis not present

## 2022-01-04 DIAGNOSIS — M6281 Muscle weakness (generalized): Secondary | ICD-10-CM | POA: Diagnosis not present

## 2022-01-04 DIAGNOSIS — R2681 Unsteadiness on feet: Secondary | ICD-10-CM | POA: Diagnosis not present

## 2022-01-04 DIAGNOSIS — E785 Hyperlipidemia, unspecified: Secondary | ICD-10-CM | POA: Diagnosis not present

## 2022-01-04 DIAGNOSIS — I110 Hypertensive heart disease with heart failure: Secondary | ICD-10-CM | POA: Diagnosis not present

## 2022-01-07 DIAGNOSIS — M6281 Muscle weakness (generalized): Secondary | ICD-10-CM | POA: Diagnosis not present

## 2022-01-07 DIAGNOSIS — E785 Hyperlipidemia, unspecified: Secondary | ICD-10-CM | POA: Diagnosis not present

## 2022-01-07 DIAGNOSIS — I110 Hypertensive heart disease with heart failure: Secondary | ICD-10-CM | POA: Diagnosis not present

## 2022-01-07 DIAGNOSIS — R2689 Other abnormalities of gait and mobility: Secondary | ICD-10-CM | POA: Diagnosis not present

## 2022-01-07 DIAGNOSIS — R2681 Unsteadiness on feet: Secondary | ICD-10-CM | POA: Diagnosis not present

## 2022-01-07 DIAGNOSIS — M6259 Muscle wasting and atrophy, not elsewhere classified, multiple sites: Secondary | ICD-10-CM | POA: Diagnosis not present

## 2022-01-07 DIAGNOSIS — E1169 Type 2 diabetes mellitus with other specified complication: Secondary | ICD-10-CM | POA: Diagnosis not present

## 2022-01-08 DIAGNOSIS — I110 Hypertensive heart disease with heart failure: Secondary | ICD-10-CM | POA: Diagnosis not present

## 2022-01-08 DIAGNOSIS — R2689 Other abnormalities of gait and mobility: Secondary | ICD-10-CM | POA: Diagnosis not present

## 2022-01-08 DIAGNOSIS — R2681 Unsteadiness on feet: Secondary | ICD-10-CM | POA: Diagnosis not present

## 2022-01-08 DIAGNOSIS — M6259 Muscle wasting and atrophy, not elsewhere classified, multiple sites: Secondary | ICD-10-CM | POA: Diagnosis not present

## 2022-01-08 DIAGNOSIS — E785 Hyperlipidemia, unspecified: Secondary | ICD-10-CM | POA: Diagnosis not present

## 2022-01-08 DIAGNOSIS — M6281 Muscle weakness (generalized): Secondary | ICD-10-CM | POA: Diagnosis not present

## 2022-01-08 DIAGNOSIS — E1169 Type 2 diabetes mellitus with other specified complication: Secondary | ICD-10-CM | POA: Diagnosis not present

## 2022-01-09 DIAGNOSIS — E785 Hyperlipidemia, unspecified: Secondary | ICD-10-CM | POA: Diagnosis not present

## 2022-01-09 DIAGNOSIS — R2689 Other abnormalities of gait and mobility: Secondary | ICD-10-CM | POA: Diagnosis not present

## 2022-01-09 DIAGNOSIS — M6259 Muscle wasting and atrophy, not elsewhere classified, multiple sites: Secondary | ICD-10-CM | POA: Diagnosis not present

## 2022-01-09 DIAGNOSIS — R2681 Unsteadiness on feet: Secondary | ICD-10-CM | POA: Diagnosis not present

## 2022-01-09 DIAGNOSIS — M6281 Muscle weakness (generalized): Secondary | ICD-10-CM | POA: Diagnosis not present

## 2022-01-09 DIAGNOSIS — E1169 Type 2 diabetes mellitus with other specified complication: Secondary | ICD-10-CM | POA: Diagnosis not present

## 2022-01-09 DIAGNOSIS — I110 Hypertensive heart disease with heart failure: Secondary | ICD-10-CM | POA: Diagnosis not present

## 2022-01-10 DIAGNOSIS — R2689 Other abnormalities of gait and mobility: Secondary | ICD-10-CM | POA: Diagnosis not present

## 2022-01-10 DIAGNOSIS — M6281 Muscle weakness (generalized): Secondary | ICD-10-CM | POA: Diagnosis not present

## 2022-01-10 DIAGNOSIS — E785 Hyperlipidemia, unspecified: Secondary | ICD-10-CM | POA: Diagnosis not present

## 2022-01-10 DIAGNOSIS — R2681 Unsteadiness on feet: Secondary | ICD-10-CM | POA: Diagnosis not present

## 2022-01-10 DIAGNOSIS — I110 Hypertensive heart disease with heart failure: Secondary | ICD-10-CM | POA: Diagnosis not present

## 2022-01-10 DIAGNOSIS — E1169 Type 2 diabetes mellitus with other specified complication: Secondary | ICD-10-CM | POA: Diagnosis not present

## 2022-01-10 DIAGNOSIS — M6259 Muscle wasting and atrophy, not elsewhere classified, multiple sites: Secondary | ICD-10-CM | POA: Diagnosis not present

## 2022-01-11 DIAGNOSIS — E1169 Type 2 diabetes mellitus with other specified complication: Secondary | ICD-10-CM | POA: Diagnosis not present

## 2022-01-11 DIAGNOSIS — R2681 Unsteadiness on feet: Secondary | ICD-10-CM | POA: Diagnosis not present

## 2022-01-11 DIAGNOSIS — I110 Hypertensive heart disease with heart failure: Secondary | ICD-10-CM | POA: Diagnosis not present

## 2022-01-11 DIAGNOSIS — E785 Hyperlipidemia, unspecified: Secondary | ICD-10-CM | POA: Diagnosis not present

## 2022-01-11 DIAGNOSIS — M6259 Muscle wasting and atrophy, not elsewhere classified, multiple sites: Secondary | ICD-10-CM | POA: Diagnosis not present

## 2022-01-11 DIAGNOSIS — M6281 Muscle weakness (generalized): Secondary | ICD-10-CM | POA: Diagnosis not present

## 2022-01-11 DIAGNOSIS — R2689 Other abnormalities of gait and mobility: Secondary | ICD-10-CM | POA: Diagnosis not present

## 2022-01-12 DIAGNOSIS — K219 Gastro-esophageal reflux disease without esophagitis: Secondary | ICD-10-CM | POA: Diagnosis not present

## 2022-01-12 DIAGNOSIS — I10 Essential (primary) hypertension: Secondary | ICD-10-CM | POA: Diagnosis not present

## 2022-01-12 DIAGNOSIS — E1169 Type 2 diabetes mellitus with other specified complication: Secondary | ICD-10-CM | POA: Diagnosis not present

## 2022-01-15 DIAGNOSIS — I110 Hypertensive heart disease with heart failure: Secondary | ICD-10-CM | POA: Diagnosis not present

## 2022-01-15 DIAGNOSIS — K219 Gastro-esophageal reflux disease without esophagitis: Secondary | ICD-10-CM | POA: Diagnosis not present

## 2022-01-15 DIAGNOSIS — E119 Type 2 diabetes mellitus without complications: Secondary | ICD-10-CM | POA: Diagnosis not present

## 2022-01-15 DIAGNOSIS — R2689 Other abnormalities of gait and mobility: Secondary | ICD-10-CM | POA: Diagnosis not present

## 2022-01-15 DIAGNOSIS — E1169 Type 2 diabetes mellitus with other specified complication: Secondary | ICD-10-CM | POA: Diagnosis not present

## 2022-01-15 DIAGNOSIS — R2681 Unsteadiness on feet: Secondary | ICD-10-CM | POA: Diagnosis not present

## 2022-01-15 DIAGNOSIS — M6281 Muscle weakness (generalized): Secondary | ICD-10-CM | POA: Diagnosis not present

## 2022-01-15 DIAGNOSIS — I10 Essential (primary) hypertension: Secondary | ICD-10-CM | POA: Diagnosis not present

## 2022-01-15 DIAGNOSIS — M6259 Muscle wasting and atrophy, not elsewhere classified, multiple sites: Secondary | ICD-10-CM | POA: Diagnosis not present

## 2022-01-15 DIAGNOSIS — E785 Hyperlipidemia, unspecified: Secondary | ICD-10-CM | POA: Diagnosis not present

## 2022-01-16 DIAGNOSIS — M6281 Muscle weakness (generalized): Secondary | ICD-10-CM | POA: Diagnosis not present

## 2022-01-16 DIAGNOSIS — E785 Hyperlipidemia, unspecified: Secondary | ICD-10-CM | POA: Diagnosis not present

## 2022-01-16 DIAGNOSIS — E1169 Type 2 diabetes mellitus with other specified complication: Secondary | ICD-10-CM | POA: Diagnosis not present

## 2022-01-16 DIAGNOSIS — R2689 Other abnormalities of gait and mobility: Secondary | ICD-10-CM | POA: Diagnosis not present

## 2022-01-16 DIAGNOSIS — R2681 Unsteadiness on feet: Secondary | ICD-10-CM | POA: Diagnosis not present

## 2022-01-16 DIAGNOSIS — I110 Hypertensive heart disease with heart failure: Secondary | ICD-10-CM | POA: Diagnosis not present

## 2022-01-16 DIAGNOSIS — M6259 Muscle wasting and atrophy, not elsewhere classified, multiple sites: Secondary | ICD-10-CM | POA: Diagnosis not present

## 2022-01-17 ENCOUNTER — Ambulatory Visit (INDEPENDENT_AMBULATORY_CARE_PROVIDER_SITE_OTHER): Payer: Medicare Other | Admitting: Psychiatry

## 2022-01-17 ENCOUNTER — Encounter: Payer: Self-pay | Admitting: Psychiatry

## 2022-01-17 DIAGNOSIS — F5105 Insomnia due to other mental disorder: Secondary | ICD-10-CM

## 2022-01-17 DIAGNOSIS — F411 Generalized anxiety disorder: Secondary | ICD-10-CM | POA: Diagnosis not present

## 2022-01-17 DIAGNOSIS — F200 Paranoid schizophrenia: Secondary | ICD-10-CM

## 2022-01-17 DIAGNOSIS — F02818 Dementia in other diseases classified elsewhere, unspecified severity, with other behavioral disturbance: Secondary | ICD-10-CM

## 2022-01-17 DIAGNOSIS — G301 Alzheimer's disease with late onset: Secondary | ICD-10-CM | POA: Diagnosis not present

## 2022-01-17 DIAGNOSIS — F422 Mixed obsessional thoughts and acts: Secondary | ICD-10-CM | POA: Diagnosis not present

## 2022-01-17 DIAGNOSIS — R2681 Unsteadiness on feet: Secondary | ICD-10-CM | POA: Diagnosis not present

## 2022-01-17 DIAGNOSIS — M6259 Muscle wasting and atrophy, not elsewhere classified, multiple sites: Secondary | ICD-10-CM | POA: Diagnosis not present

## 2022-01-17 DIAGNOSIS — I110 Hypertensive heart disease with heart failure: Secondary | ICD-10-CM | POA: Diagnosis not present

## 2022-01-17 DIAGNOSIS — E785 Hyperlipidemia, unspecified: Secondary | ICD-10-CM | POA: Diagnosis not present

## 2022-01-17 DIAGNOSIS — R2689 Other abnormalities of gait and mobility: Secondary | ICD-10-CM | POA: Diagnosis not present

## 2022-01-17 DIAGNOSIS — E1169 Type 2 diabetes mellitus with other specified complication: Secondary | ICD-10-CM | POA: Diagnosis not present

## 2022-01-17 DIAGNOSIS — M6281 Muscle weakness (generalized): Secondary | ICD-10-CM | POA: Diagnosis not present

## 2022-01-17 NOTE — Progress Notes (Signed)
Alif Petrak [id number] 27-Apr-1940 81 y.o.     Subjective:   Patient ID:  Coltyn Hanning. is a 81 y.o. (DOB [birth year]) male.  Chief Complaint:  Chief Complaint  Patient presents with   Follow-up    Schizophrenia, paranoid ([place])   Anxiety   Hallucinations    Depression        Associated symptoms include decreased concentration and fatigue.  Associated symptoms include no headaches and no suicidal ideas.  Past medical history includes anxiety.   Anxiety Symptoms include confusion, decreased concentration, dizziness and nervous/anxious behavior. Patient reports no chest pain, palpitations or suicidal ideas.     [name]. presents to the office today for follow-up of schizophrenia and dementia.  Resident of [facility].  seen April 15, 2019.  No meds were changed.  As of 06/03/2019 the following is noted: Staff reports patient calls very frequently and has for an extended period of time.  He is chronically somewhat needy and lonely as well as has a tendency for reassurance checking. Still bothered by religious thoughts and can't tell if it's from God than or Satan.  Supernatural things get in his mind.  Also thinks that people there don't like him at [facility].  Also upset at Biden's spending excess.  Wants to vote for [name].  Also bothered he doesn't get to do much.    Had thoughts he needed to go to the hospital one night for [place] reasons but staff encouraged him to wait it out and he felt better the next day. Poor STM. No med changes  9/20/21appt with the following noted: Worries over losing trusted banker.  Worries money is running out.  Worries about getting Covid.  Memory is worse.  Gets obsessive fears about his salvation especially  Listening to [place] radio at times but has chronically worried over it.   Hears voices talking about his mental health and to listen to radio.   Don't have a whole lot of them but enough to get him upset from  time to time.. Not markedly depressed. Plan no med changes  02/21/2020 appointment with the following noted: [place] some noise, ring in his head but not voices. Worries about it. They take good care of me over there.  Listens to radio a lot at night. And doesn't get a lot of sleep at night but will nap daytime. Reads bible but often doesn't understand or remember it. No SE. Calls on [facility] to help him emotionally and spiritually.   LTM good.  More poor STM.    Wonders if it's possible that he'll ever get well.    Watched NFL football yesterday and enjoyed it. Plan: No med changes  07/19/2020 appointment with the following noted: Has had to call couple times after hours bc was ruminating on past issues.  Today feels OK. Recognizes problems with STM but in his heart feels it will get better. Food good at [employer].  People are treating him well except with one man.  [name] avoids him. Still on olanzapine 25 mg HS and fluovxamine 100, zolpidem 5 HS, Xanax 0.25 mg prn. Can't afford caregiver to take him out daily as in the past. Wonders what doc thinks his IQ would have been.   No concerns about med except wants one to make him well.  01/18/2021 appt noted: Still at [facility].  Feels OK about it.   Still ups and downs but overall holding his own.  Doesn't feel 80  yo.  WC bound but can still move himself around independently.  U incontinence. Getting along pretty well with people, but times thinks people don't like him.  Still wonders if he needs psych hospitalization. Notices memory problems. Still worries chronically over his salvation but not continuous and anxiety is no worse. Voices are better but still has intermittent AH.   CO trouble staying asleep but at times can sleep too much No SE noted Doing PT to try and get stronger. Plan: No med changes today. Successfully reduced alprazolam to 0.25 mg HS Continue olanzapine 25 mg HS for psychosis Continue fluvoxamine 100 mg HS for OCD  and anxiety Continue zolpidem 5 mg HS for sleep Consider stopping alprazolam 0.25 mg HS as long as he's sleeping well. Or reducing to 0.125 mg HS.  Dose is already low  07/19/2021 appointment with the following noted: Incontinent of urine in the office. He thinks overall he is doing better.  Not afraid at all.  Some worry.   Realizes he's here for 6 mos followup. But he does recognize some memory problems.  Sometimes has trouble with use of technology. Not much re: voices except hears noises at times that disturb him.   Not depressed.  Enjoys bingo. Asks for reassurance. No med concerns. Plan: No med changes today. Successfully reduced alprazolam to 0.25 mg HS Continue olanzapine 25 mg HS for psychosis Continue fluvoxamine 100 mg HS for OCD and anxiety Continue zolpidem 5 mg HS for sleep Consider stopping alprazolam 0.25 mg HS as long as he's sleeping well. Or reducing to 0.125 mg HS.  Dose is already low  01/17/22 appt noted: Reduced alpraZolam to 0.125 mg nightly and Ambien to 2.5 mg nightly.  He has continued the other medicines.   He's satisfied living at [facility]. "Do you think I've been improved?"  Not depressed.  Worry comes and goes. Listens to relaxing radio show at night. He's aware he is not making frequent calls to the office like he used to do. Acknowledges some memory issues. Not as bothered by [facility].   Some worry that he's run out of his trust fund money.   Past Psychiatric Medication Trials:  some of them are unknown,  haloperidol, Serentil, Stelazine, Moban, perphenazine, risperidone, Thorazine, Seroquel 800 mg, loxapine, been on Zyprexa since September 2006 and that has produced the best control of his paranoia at 25 mg daily. benztropine,    paroxetine, sertraline side effects,   Hydroxyzine, trazodone no response,  Xanax, zolpidem.  Review of Systems:  Review of Systems  Constitutional:  Positive for fatigue.  HENT:  Positive for tinnitus.    Cardiovascular:  Negative for chest pain and palpitations.  Genitourinary:  Positive for enuresis. Negative for flank pain.  Musculoskeletal:  Positive for arthralgias, back pain and gait problem.  Neurological:  Positive for dizziness, tremors and weakness. Negative for headaches.       WC bound  Psychiatric/Behavioral:  Positive for confusion, decreased concentration, hallucinations and sleep disturbance. Negative for agitation, self-injury and suicidal ideas. The patient is nervous/anxious. The patient is not hyperactive.     Medications: I have reviewed the patient's current medications.  Current Outpatient Medications  Medication Sig Dispense Refill   ALPRAZolam (XANAX) 0.25 MG tablet Take 0.125 mg by mouth at bedtime.     amLODipine (NORVASC) 2.5 MG tablet      amLODipine (NORVASC) 5 MG tablet Take 5 mg by mouth daily.     Calcium Citrate-Vitamin D (CITRACAL + D PO) Take  1 tablet by mouth daily.     Cholecalciferol (VITAMIN D-3) 1000 units CAPS Take 1 capsule by mouth daily.     docusate sodium (COLACE) 100 MG capsule Take 100 mg by mouth 2 (two) times daily.     empagliflozin (JARDIANCE) 10 MG TABS tablet Take 10 mg by mouth daily. 2 tablet 0   fluvoxaMINE (LUVOX) 100 MG tablet Take 1 tablet (100 mg total) by mouth at bedtime. 2 tablet 0   furosemide (LASIX) 20 MG tablet Take 20 mg by mouth daily before breakfast.      HUMALOG KWIKPEN 100 UNIT/ML KiwkPen Inject 2-11 Units into the skin See admin instructions. Times: 0630 1130 1630  Scale: 101-150: 2 units 151-200: 3 units 201-250: 5 units 251-300: 7 units 301-350: 9 units >350: 11 units     insulin detemir (LEVEMIR) 100 UNIT/ML injection Inject 0.3 mLs (30 Units total) into the skin at bedtime. (Patient taking differently: Inject 24 Units into the skin at bedtime.) 10 mL 11   insulin glargine (LANTUS) 100 UNIT/ML injection 25 u     insulin regular (NOVOLIN R) 100 units/mL injection ss     meloxicam (MOBIC) 15 MG tablet 1  tablet     Menthol, Topical Analgesic, (BIOFREEZE) 4 % GEL Apply topically.     metFORMIN (GLUCOPHAGE) 500 MG tablet 1 tablet with meals     Multiple Vitamins-Minerals (THERAGRAN-M PREMIER 50 PLUS PO) Take 1 tablet by mouth daily.     MYRBETRIQ 25 MG TB24 tablet Take 25 mg by mouth daily.      OLANZapine (ZYPREXA) 5 MG tablet Take 5 tablets (25 mg total) by mouth daily. 2 tablet 0   Omega-3 Fatty Acids (FISH OIL) 1200 MG CAPS      omeprazole (PRILOSEC) 40 MG capsule Take 40 mg by mouth daily.      polyethylene glycol powder (GLYCOLAX/MIRALAX) 17 GM/SCOOP powder Take 1 Container by mouth once.     pravastatin (PRAVACHOL) 40 MG tablet Take 40 mg by mouth daily after lunch.      tamsulosin (FLOMAX) 0.4 MG CAPS capsule Take 0.4 mg by mouth daily.      traMADol (ULTRAM) 50 MG tablet Take 1 tablet (50 mg total) by mouth every 6 (six) hours as needed. 20 tablet 0   zolpidem (AMBIEN) 5 MG tablet Take 1 tablet (5 mg total) by mouth at bedtime. (Patient taking differently: Take 5 mg by mouth at bedtime. 1/2 tab) 2 tablet 0   No current facility-administered medications for this visit.    Medication Side Effects: Other: dry mouth  Allergies:  Allergies  Allergen Reactions   Asa [Aspirin]     "Dr told me not to take it"   Codeine Other (See Comments)    DIZZINESS with Tylenol 3   Tylenol With Codeine #3 [Acetaminophen-Codeine]     Other reaction(s): Unknown   Tylenol [Acetaminophen] Other (See Comments)    Dizziness with tylenol 3    Past Medical History:  Diagnosis Date   Abnormality of gait    Adenomatous colon polyp    Arthritis    Cataract    Dementia ([place])    Depression    Diabetes mellitus without complication ([place])    Fatty liver    Hyperlipidemia    Hypertension    Internal hemorrhoids    Other malaise and fatigue    Peripheral edema    Schizophrenia ([place])    Type II or unspecified type diabetes mellitus without mention of complication, not stated  as uncontrolled     Urinary frequency    Urinary retention     Family History  Problem Relation Age of Onset   Brain cancer Father     Social History   Socioeconomic History   Marital status: Single    Spouse name: Not on file   Number of children: Not on file   Years of education: college   Highest education level: Not on file  Occupational History    Employer: RETIRED    Comment: Disabled  Tobacco Use   Smoking status: Never   Smokeless tobacco: Never  Substance and Sexual Activity   Alcohol use: No   Drug use: No   Sexual activity: Not on file  Other Topics Concern   Not on file  Social History Narrative   Patient is disabled and he has not worked since 1962. Patient has some college education.Patient drinks 2 cups of coffee daily.   Right handed.   Social Determinants of Health   Financial Resource Strain: Not on file  Food Insecurity: Not on file  Transportation Needs: Not on file  Physical Activity: Not on file  Stress: Not on file  Social Connections: Not on file  Intimate Partner Violence: Not on file    Past Medical History, Surgical history, Social history, and Family history were reviewed and updated as appropriate.   Please see review of systems for further details on the patient's review from today.   Objective:   Physical Exam:  There were no vitals taken for this visit.  Physical Exam Constitutional:      Appearance: He is obese.  Neurological:     Mental Status: He is alert and oriented to person, place, and time.     Cranial Nerves: Dysarthria present.     Motor: Weakness present.     Gait: Gait abnormal.     Comments: Mild dysarthria chronic   Psychiatric:        Attention and Perception: Attention normal. He is attentive. He perceives auditory hallucinations. He does not perceive visual hallucinations.        Mood and Affect: Mood is anxious. Mood is not depressed. Affect is not angry or tearful.        Speech: Speech normal. Speech is not rapid and  pressured or slurred.        Behavior: Behavior is slowed. Behavior is not agitated or aggressive. Behavior is cooperative.        Thought Content: Thought content is paranoid and delusional. Thought content does not include homicidal or suicidal ideation. Thought content does not include homicidal or suicidal plan.        Cognition and Memory: Cognition is impaired. Memory is impaired. He does not exhibit impaired recent memory.     Comments: Chronic voices better. AH is just a noise not voice when he does something he feels guilty about. Fair insight and judgment. Talkative and repeats himself some. Memory is stable.Braxton Feathers words at times but decent fund of knowledge. Reassurance seeking chronically. Some IOR re commericials on TV ongoing not disturbing. Obsessive spiritual thought and compulsive behaviors are less prominent  Less paranoid than usual. Affect calm.      Lab Review:     Component Value Date/Time   NA 139 01/22/2018 0339   K 3.6 01/22/2018 0339   CL 100 01/22/2018 0339   CO2 28 01/22/2018 0339   GLUCOSE 231 (H) 01/22/2018 0339   BUN 15 01/22/2018 0339   CREATININE 0.96 01/22/2018 0339  CALCIUM 9.2 01/22/2018 0339   PROT 7.0 01/22/2018 0339   ALBUMIN 3.3 (L) 01/22/2018 0339   AST 35 01/22/2018 0339   ALT 38 01/22/2018 0339   ALKPHOS 71 01/22/2018 0339   BILITOT 0.6 01/22/2018 0339   GFRNONAA >60 01/22/2018 0339   GFRAA >60 01/22/2018 0339       Component Value Date/Time   WBC 6.8 01/22/2018 0339   RBC 4.48 01/22/2018 0339   HGB 12.9 (L) 01/22/2018 0339   HCT 41.7 01/22/2018 0339   PLT 211 01/22/2018 0339   MCV 93.1 01/22/2018 0339   MCH 28.8 01/22/2018 0339   MCHC 30.9 01/22/2018 0339   RDW 14.0 01/22/2018 0339   LYMPHSABS 1.8 01/22/2018 0339   MONOABS 0.6 01/22/2018 0339   EOSABS 0.3 01/22/2018 0339   BASOSABS 0.1 01/22/2018 0339    No results found for: "POCLITH", "LITHIUM"   No results found for: "PHENYTOIN", "PHENOBARB", "VALPROATE", "CBMZ"    .res Assessment: Plan:    Yovani was seen today for follow-up, anxiety and hallucinations.  Diagnoses and all orders for this visit:  Schizophrenia, paranoid ([place])  Mixed obsessional thoughts and acts  Generalized anxiety disorder  Late onset Alzheimer's disease with behavioral disturbance ([place])  Insomnia due to mental condition   Greater than 50% of 30 min face to face time with patient was spent on counseling and coordination of care. We discussed several topics as noted: Reviewed documents today from [facility] including med list. Med reconciliation.  Patient has been under my psychiatric years since 1998 .unlikely that med change will reduce the paranoia and auditory hallucinations which are chronic. Voices are better and paranoia appears a little better but varies.  Reports still cooperative with staff.  He has been on multiple psychiatric medications and has done best on this combination of Zyprexa which was increased to 25 mg April 09, 2018, a minimal dose of alprazolam reduced to 0.125 mg HS, and fluvoxamine 100 mg daily. Is cooperative genereally.   He also still has obsessive and intrusive fears about losing his salvation but they are chronic but less severe over time.  Given his age and the chronicity of the symptoms med changes are unlikely to help.  Chronic reassurance seeking.  Calls people for reassurance. Overall also paranoia is better than other times.  Needs high dose olanzapine.  Supportive therapy dealing with chronic paranoia and isolation from Covid.  Encourage his spirituality as a coping mechanism.  Needs a lot of encouragement.  Repetitively asks for reassurance.  He is making fewer after hours phone calls to our office than historically has been done.  Does not look like any in the last few months.    Weaker and needing more physical assistance.  WC bound    Discussed potential metabolic side effects associated with atypical antipsychotics, as well  as potential risk for movement side effects. Advised pt to contact office if movement side effects occur.   Disc he's taking more than the usuall highest dose of olanzapine but is medically necessary for paranoia. No AIM.  successfully reduced alprazolam to 0.125 mg HS DC alprazolam 0.125 mg HS and instead give 0.125 mg prn anxiety. Continue olanzapine 25 mg HS for psychosis Continue fluvoxamine 100 mg HS for OCD and anxiety Continue zolpidem 2.5 mg HS for sleep.  If he gets the olanzapine 2-3 hours before  bedtime he might be able to stop this too.  30 min appt  FU 6 mos  Lynder Parents, MD, DFAPA .  Future Appointments  Date Time Provider [place]  03/15/2022 10:00 AM Gardiner Barefoot, DPM [facility]    No orders of the defined types were placed in this encounter.      -------------------------------

## 2022-01-19 DIAGNOSIS — M6259 Muscle wasting and atrophy, not elsewhere classified, multiple sites: Secondary | ICD-10-CM | POA: Diagnosis not present

## 2022-01-19 DIAGNOSIS — E785 Hyperlipidemia, unspecified: Secondary | ICD-10-CM | POA: Diagnosis not present

## 2022-01-19 DIAGNOSIS — R2681 Unsteadiness on feet: Secondary | ICD-10-CM | POA: Diagnosis not present

## 2022-01-19 DIAGNOSIS — M6281 Muscle weakness (generalized): Secondary | ICD-10-CM | POA: Diagnosis not present

## 2022-01-19 DIAGNOSIS — I110 Hypertensive heart disease with heart failure: Secondary | ICD-10-CM | POA: Diagnosis not present

## 2022-01-19 DIAGNOSIS — E1169 Type 2 diabetes mellitus with other specified complication: Secondary | ICD-10-CM | POA: Diagnosis not present

## 2022-01-19 DIAGNOSIS — R2689 Other abnormalities of gait and mobility: Secondary | ICD-10-CM | POA: Diagnosis not present

## 2022-01-21 DIAGNOSIS — R2689 Other abnormalities of gait and mobility: Secondary | ICD-10-CM | POA: Diagnosis not present

## 2022-01-21 DIAGNOSIS — R2681 Unsteadiness on feet: Secondary | ICD-10-CM | POA: Diagnosis not present

## 2022-01-21 DIAGNOSIS — M6259 Muscle wasting and atrophy, not elsewhere classified, multiple sites: Secondary | ICD-10-CM | POA: Diagnosis not present

## 2022-01-21 DIAGNOSIS — M6281 Muscle weakness (generalized): Secondary | ICD-10-CM | POA: Diagnosis not present

## 2022-01-21 DIAGNOSIS — E1169 Type 2 diabetes mellitus with other specified complication: Secondary | ICD-10-CM | POA: Diagnosis not present

## 2022-01-21 DIAGNOSIS — E785 Hyperlipidemia, unspecified: Secondary | ICD-10-CM | POA: Diagnosis not present

## 2022-01-21 DIAGNOSIS — I110 Hypertensive heart disease with heart failure: Secondary | ICD-10-CM | POA: Diagnosis not present

## 2022-01-22 DIAGNOSIS — E785 Hyperlipidemia, unspecified: Secondary | ICD-10-CM | POA: Diagnosis not present

## 2022-01-22 DIAGNOSIS — E1169 Type 2 diabetes mellitus with other specified complication: Secondary | ICD-10-CM | POA: Diagnosis not present

## 2022-01-22 DIAGNOSIS — R2681 Unsteadiness on feet: Secondary | ICD-10-CM | POA: Diagnosis not present

## 2022-01-22 DIAGNOSIS — M6281 Muscle weakness (generalized): Secondary | ICD-10-CM | POA: Diagnosis not present

## 2022-01-22 DIAGNOSIS — M6259 Muscle wasting and atrophy, not elsewhere classified, multiple sites: Secondary | ICD-10-CM | POA: Diagnosis not present

## 2022-01-22 DIAGNOSIS — R2689 Other abnormalities of gait and mobility: Secondary | ICD-10-CM | POA: Diagnosis not present

## 2022-01-22 DIAGNOSIS — I110 Hypertensive heart disease with heart failure: Secondary | ICD-10-CM | POA: Diagnosis not present

## 2022-01-23 DIAGNOSIS — I110 Hypertensive heart disease with heart failure: Secondary | ICD-10-CM | POA: Diagnosis not present

## 2022-01-23 DIAGNOSIS — E785 Hyperlipidemia, unspecified: Secondary | ICD-10-CM | POA: Diagnosis not present

## 2022-01-23 DIAGNOSIS — M6281 Muscle weakness (generalized): Secondary | ICD-10-CM | POA: Diagnosis not present

## 2022-01-23 DIAGNOSIS — R2681 Unsteadiness on feet: Secondary | ICD-10-CM | POA: Diagnosis not present

## 2022-01-23 DIAGNOSIS — M6259 Muscle wasting and atrophy, not elsewhere classified, multiple sites: Secondary | ICD-10-CM | POA: Diagnosis not present

## 2022-01-23 DIAGNOSIS — E1169 Type 2 diabetes mellitus with other specified complication: Secondary | ICD-10-CM | POA: Diagnosis not present

## 2022-01-23 DIAGNOSIS — R2689 Other abnormalities of gait and mobility: Secondary | ICD-10-CM | POA: Diagnosis not present

## 2022-01-24 DIAGNOSIS — R2681 Unsteadiness on feet: Secondary | ICD-10-CM | POA: Diagnosis not present

## 2022-01-24 DIAGNOSIS — R2689 Other abnormalities of gait and mobility: Secondary | ICD-10-CM | POA: Diagnosis not present

## 2022-01-24 DIAGNOSIS — E785 Hyperlipidemia, unspecified: Secondary | ICD-10-CM | POA: Diagnosis not present

## 2022-01-24 DIAGNOSIS — E1169 Type 2 diabetes mellitus with other specified complication: Secondary | ICD-10-CM | POA: Diagnosis not present

## 2022-01-24 DIAGNOSIS — I110 Hypertensive heart disease with heart failure: Secondary | ICD-10-CM | POA: Diagnosis not present

## 2022-01-24 DIAGNOSIS — M6259 Muscle wasting and atrophy, not elsewhere classified, multiple sites: Secondary | ICD-10-CM | POA: Diagnosis not present

## 2022-01-24 DIAGNOSIS — M6281 Muscle weakness (generalized): Secondary | ICD-10-CM | POA: Diagnosis not present

## 2022-01-25 DIAGNOSIS — E119 Type 2 diabetes mellitus without complications: Secondary | ICD-10-CM | POA: Diagnosis not present

## 2022-01-25 DIAGNOSIS — M6259 Muscle wasting and atrophy, not elsewhere classified, multiple sites: Secondary | ICD-10-CM | POA: Diagnosis not present

## 2022-01-25 DIAGNOSIS — M6281 Muscle weakness (generalized): Secondary | ICD-10-CM | POA: Diagnosis not present

## 2022-01-25 DIAGNOSIS — G4709 Other insomnia: Secondary | ICD-10-CM | POA: Diagnosis not present

## 2022-01-25 DIAGNOSIS — R2681 Unsteadiness on feet: Secondary | ICD-10-CM | POA: Diagnosis not present

## 2022-01-25 DIAGNOSIS — R2689 Other abnormalities of gait and mobility: Secondary | ICD-10-CM | POA: Diagnosis not present

## 2022-01-25 DIAGNOSIS — I110 Hypertensive heart disease with heart failure: Secondary | ICD-10-CM | POA: Diagnosis not present

## 2022-01-25 DIAGNOSIS — I10 Essential (primary) hypertension: Secondary | ICD-10-CM | POA: Diagnosis not present

## 2022-01-25 DIAGNOSIS — E785 Hyperlipidemia, unspecified: Secondary | ICD-10-CM | POA: Diagnosis not present

## 2022-01-25 DIAGNOSIS — E1169 Type 2 diabetes mellitus with other specified complication: Secondary | ICD-10-CM | POA: Diagnosis not present

## 2022-01-27 DIAGNOSIS — I10 Essential (primary) hypertension: Secondary | ICD-10-CM | POA: Diagnosis not present

## 2022-01-27 DIAGNOSIS — K219 Gastro-esophageal reflux disease without esophagitis: Secondary | ICD-10-CM | POA: Diagnosis not present

## 2022-01-27 DIAGNOSIS — K5909 Other constipation: Secondary | ICD-10-CM | POA: Diagnosis not present

## 2022-01-27 DIAGNOSIS — G4709 Other insomnia: Secondary | ICD-10-CM | POA: Diagnosis not present

## 2022-01-28 DIAGNOSIS — E785 Hyperlipidemia, unspecified: Secondary | ICD-10-CM | POA: Diagnosis not present

## 2022-01-28 DIAGNOSIS — R2681 Unsteadiness on feet: Secondary | ICD-10-CM | POA: Diagnosis not present

## 2022-01-28 DIAGNOSIS — M6281 Muscle weakness (generalized): Secondary | ICD-10-CM | POA: Diagnosis not present

## 2022-01-28 DIAGNOSIS — I110 Hypertensive heart disease with heart failure: Secondary | ICD-10-CM | POA: Diagnosis not present

## 2022-01-28 DIAGNOSIS — E1169 Type 2 diabetes mellitus with other specified complication: Secondary | ICD-10-CM | POA: Diagnosis not present

## 2022-01-28 DIAGNOSIS — R2689 Other abnormalities of gait and mobility: Secondary | ICD-10-CM | POA: Diagnosis not present

## 2022-01-28 DIAGNOSIS — M6259 Muscle wasting and atrophy, not elsewhere classified, multiple sites: Secondary | ICD-10-CM | POA: Diagnosis not present

## 2022-01-29 DIAGNOSIS — M6281 Muscle weakness (generalized): Secondary | ICD-10-CM | POA: Diagnosis not present

## 2022-01-29 DIAGNOSIS — I110 Hypertensive heart disease with heart failure: Secondary | ICD-10-CM | POA: Diagnosis not present

## 2022-01-29 DIAGNOSIS — E1169 Type 2 diabetes mellitus with other specified complication: Secondary | ICD-10-CM | POA: Diagnosis not present

## 2022-01-29 DIAGNOSIS — R2689 Other abnormalities of gait and mobility: Secondary | ICD-10-CM | POA: Diagnosis not present

## 2022-01-29 DIAGNOSIS — E785 Hyperlipidemia, unspecified: Secondary | ICD-10-CM | POA: Diagnosis not present

## 2022-01-29 DIAGNOSIS — M6259 Muscle wasting and atrophy, not elsewhere classified, multiple sites: Secondary | ICD-10-CM | POA: Diagnosis not present

## 2022-01-29 DIAGNOSIS — R2681 Unsteadiness on feet: Secondary | ICD-10-CM | POA: Diagnosis not present

## 2022-01-30 DIAGNOSIS — I110 Hypertensive heart disease with heart failure: Secondary | ICD-10-CM | POA: Diagnosis not present

## 2022-01-30 DIAGNOSIS — E785 Hyperlipidemia, unspecified: Secondary | ICD-10-CM | POA: Diagnosis not present

## 2022-01-30 DIAGNOSIS — R2681 Unsteadiness on feet: Secondary | ICD-10-CM | POA: Diagnosis not present

## 2022-01-30 DIAGNOSIS — M6281 Muscle weakness (generalized): Secondary | ICD-10-CM | POA: Diagnosis not present

## 2022-01-30 DIAGNOSIS — M6259 Muscle wasting and atrophy, not elsewhere classified, multiple sites: Secondary | ICD-10-CM | POA: Diagnosis not present

## 2022-01-30 DIAGNOSIS — R2689 Other abnormalities of gait and mobility: Secondary | ICD-10-CM | POA: Diagnosis not present

## 2022-01-30 DIAGNOSIS — E1169 Type 2 diabetes mellitus with other specified complication: Secondary | ICD-10-CM | POA: Diagnosis not present

## 2022-01-31 DIAGNOSIS — M6259 Muscle wasting and atrophy, not elsewhere classified, multiple sites: Secondary | ICD-10-CM | POA: Diagnosis not present

## 2022-01-31 DIAGNOSIS — E1169 Type 2 diabetes mellitus with other specified complication: Secondary | ICD-10-CM | POA: Diagnosis not present

## 2022-01-31 DIAGNOSIS — E785 Hyperlipidemia, unspecified: Secondary | ICD-10-CM | POA: Diagnosis not present

## 2022-01-31 DIAGNOSIS — R2681 Unsteadiness on feet: Secondary | ICD-10-CM | POA: Diagnosis not present

## 2022-01-31 DIAGNOSIS — M6281 Muscle weakness (generalized): Secondary | ICD-10-CM | POA: Diagnosis not present

## 2022-01-31 DIAGNOSIS — R2689 Other abnormalities of gait and mobility: Secondary | ICD-10-CM | POA: Diagnosis not present

## 2022-01-31 DIAGNOSIS — I110 Hypertensive heart disease with heart failure: Secondary | ICD-10-CM | POA: Diagnosis not present

## 2022-02-01 DIAGNOSIS — I110 Hypertensive heart disease with heart failure: Secondary | ICD-10-CM | POA: Diagnosis not present

## 2022-02-01 DIAGNOSIS — E1169 Type 2 diabetes mellitus with other specified complication: Secondary | ICD-10-CM | POA: Diagnosis not present

## 2022-02-01 DIAGNOSIS — M6259 Muscle wasting and atrophy, not elsewhere classified, multiple sites: Secondary | ICD-10-CM | POA: Diagnosis not present

## 2022-02-01 DIAGNOSIS — E119 Type 2 diabetes mellitus without complications: Secondary | ICD-10-CM | POA: Diagnosis not present

## 2022-02-01 DIAGNOSIS — I10 Essential (primary) hypertension: Secondary | ICD-10-CM | POA: Diagnosis not present

## 2022-02-01 DIAGNOSIS — E785 Hyperlipidemia, unspecified: Secondary | ICD-10-CM | POA: Diagnosis not present

## 2022-02-01 DIAGNOSIS — R2689 Other abnormalities of gait and mobility: Secondary | ICD-10-CM | POA: Diagnosis not present

## 2022-02-01 DIAGNOSIS — R2681 Unsteadiness on feet: Secondary | ICD-10-CM | POA: Diagnosis not present

## 2022-02-01 DIAGNOSIS — M6281 Muscle weakness (generalized): Secondary | ICD-10-CM | POA: Diagnosis not present

## 2022-02-03 DIAGNOSIS — E1169 Type 2 diabetes mellitus with other specified complication: Secondary | ICD-10-CM | POA: Diagnosis not present

## 2022-02-03 DIAGNOSIS — K5909 Other constipation: Secondary | ICD-10-CM | POA: Diagnosis not present

## 2022-02-03 DIAGNOSIS — K219 Gastro-esophageal reflux disease without esophagitis: Secondary | ICD-10-CM | POA: Diagnosis not present

## 2022-02-04 DIAGNOSIS — M6281 Muscle weakness (generalized): Secondary | ICD-10-CM | POA: Diagnosis not present

## 2022-02-04 DIAGNOSIS — E785 Hyperlipidemia, unspecified: Secondary | ICD-10-CM | POA: Diagnosis not present

## 2022-02-04 DIAGNOSIS — E1169 Type 2 diabetes mellitus with other specified complication: Secondary | ICD-10-CM | POA: Diagnosis not present

## 2022-02-04 DIAGNOSIS — R2689 Other abnormalities of gait and mobility: Secondary | ICD-10-CM | POA: Diagnosis not present

## 2022-02-04 DIAGNOSIS — I110 Hypertensive heart disease with heart failure: Secondary | ICD-10-CM | POA: Diagnosis not present

## 2022-02-04 DIAGNOSIS — M6259 Muscle wasting and atrophy, not elsewhere classified, multiple sites: Secondary | ICD-10-CM | POA: Diagnosis not present

## 2022-02-04 DIAGNOSIS — R2681 Unsteadiness on feet: Secondary | ICD-10-CM | POA: Diagnosis not present

## 2022-02-06 DIAGNOSIS — M6259 Muscle wasting and atrophy, not elsewhere classified, multiple sites: Secondary | ICD-10-CM | POA: Diagnosis not present

## 2022-02-06 DIAGNOSIS — R2689 Other abnormalities of gait and mobility: Secondary | ICD-10-CM | POA: Diagnosis not present

## 2022-02-06 DIAGNOSIS — I110 Hypertensive heart disease with heart failure: Secondary | ICD-10-CM | POA: Diagnosis not present

## 2022-02-06 DIAGNOSIS — E1169 Type 2 diabetes mellitus with other specified complication: Secondary | ICD-10-CM | POA: Diagnosis not present

## 2022-02-06 DIAGNOSIS — E785 Hyperlipidemia, unspecified: Secondary | ICD-10-CM | POA: Diagnosis not present

## 2022-02-06 DIAGNOSIS — R2681 Unsteadiness on feet: Secondary | ICD-10-CM | POA: Diagnosis not present

## 2022-02-06 DIAGNOSIS — M6281 Muscle weakness (generalized): Secondary | ICD-10-CM | POA: Diagnosis not present

## 2022-02-07 DIAGNOSIS — I10 Essential (primary) hypertension: Secondary | ICD-10-CM | POA: Diagnosis not present

## 2022-02-07 DIAGNOSIS — I110 Hypertensive heart disease with heart failure: Secondary | ICD-10-CM | POA: Diagnosis not present

## 2022-02-07 DIAGNOSIS — M6281 Muscle weakness (generalized): Secondary | ICD-10-CM | POA: Diagnosis not present

## 2022-02-07 DIAGNOSIS — R2681 Unsteadiness on feet: Secondary | ICD-10-CM | POA: Diagnosis not present

## 2022-02-07 DIAGNOSIS — E1169 Type 2 diabetes mellitus with other specified complication: Secondary | ICD-10-CM | POA: Diagnosis not present

## 2022-02-07 DIAGNOSIS — M6259 Muscle wasting and atrophy, not elsewhere classified, multiple sites: Secondary | ICD-10-CM | POA: Diagnosis not present

## 2022-02-07 DIAGNOSIS — E785 Hyperlipidemia, unspecified: Secondary | ICD-10-CM | POA: Diagnosis not present

## 2022-02-07 DIAGNOSIS — R2689 Other abnormalities of gait and mobility: Secondary | ICD-10-CM | POA: Diagnosis not present

## 2022-02-07 DIAGNOSIS — N3281 Overactive bladder: Secondary | ICD-10-CM | POA: Diagnosis not present

## 2022-02-08 DIAGNOSIS — M6281 Muscle weakness (generalized): Secondary | ICD-10-CM | POA: Diagnosis not present

## 2022-02-08 DIAGNOSIS — R2681 Unsteadiness on feet: Secondary | ICD-10-CM | POA: Diagnosis not present

## 2022-02-08 DIAGNOSIS — I110 Hypertensive heart disease with heart failure: Secondary | ICD-10-CM | POA: Diagnosis not present

## 2022-02-08 DIAGNOSIS — R2689 Other abnormalities of gait and mobility: Secondary | ICD-10-CM | POA: Diagnosis not present

## 2022-02-08 DIAGNOSIS — E1169 Type 2 diabetes mellitus with other specified complication: Secondary | ICD-10-CM | POA: Diagnosis not present

## 2022-02-08 DIAGNOSIS — M6259 Muscle wasting and atrophy, not elsewhere classified, multiple sites: Secondary | ICD-10-CM | POA: Diagnosis not present

## 2022-02-08 DIAGNOSIS — E785 Hyperlipidemia, unspecified: Secondary | ICD-10-CM | POA: Diagnosis not present

## 2022-02-09 DIAGNOSIS — R2681 Unsteadiness on feet: Secondary | ICD-10-CM | POA: Diagnosis not present

## 2022-02-09 DIAGNOSIS — K219 Gastro-esophageal reflux disease without esophagitis: Secondary | ICD-10-CM | POA: Diagnosis not present

## 2022-02-09 DIAGNOSIS — M6259 Muscle wasting and atrophy, not elsewhere classified, multiple sites: Secondary | ICD-10-CM | POA: Diagnosis not present

## 2022-02-09 DIAGNOSIS — I110 Hypertensive heart disease with heart failure: Secondary | ICD-10-CM | POA: Diagnosis not present

## 2022-02-09 DIAGNOSIS — K5909 Other constipation: Secondary | ICD-10-CM | POA: Diagnosis not present

## 2022-02-09 DIAGNOSIS — E1169 Type 2 diabetes mellitus with other specified complication: Secondary | ICD-10-CM | POA: Diagnosis not present

## 2022-02-09 DIAGNOSIS — E785 Hyperlipidemia, unspecified: Secondary | ICD-10-CM | POA: Diagnosis not present

## 2022-02-09 DIAGNOSIS — M6281 Muscle weakness (generalized): Secondary | ICD-10-CM | POA: Diagnosis not present

## 2022-02-09 DIAGNOSIS — R2689 Other abnormalities of gait and mobility: Secondary | ICD-10-CM | POA: Diagnosis not present

## 2022-02-09 DIAGNOSIS — I10 Essential (primary) hypertension: Secondary | ICD-10-CM | POA: Diagnosis not present

## 2022-02-11 DIAGNOSIS — M6259 Muscle wasting and atrophy, not elsewhere classified, multiple sites: Secondary | ICD-10-CM | POA: Diagnosis not present

## 2022-02-11 DIAGNOSIS — E785 Hyperlipidemia, unspecified: Secondary | ICD-10-CM | POA: Diagnosis not present

## 2022-02-11 DIAGNOSIS — R2681 Unsteadiness on feet: Secondary | ICD-10-CM | POA: Diagnosis not present

## 2022-02-11 DIAGNOSIS — I110 Hypertensive heart disease with heart failure: Secondary | ICD-10-CM | POA: Diagnosis not present

## 2022-02-11 DIAGNOSIS — R2689 Other abnormalities of gait and mobility: Secondary | ICD-10-CM | POA: Diagnosis not present

## 2022-02-11 DIAGNOSIS — E1169 Type 2 diabetes mellitus with other specified complication: Secondary | ICD-10-CM | POA: Diagnosis not present

## 2022-02-11 DIAGNOSIS — M6281 Muscle weakness (generalized): Secondary | ICD-10-CM | POA: Diagnosis not present

## 2022-02-12 DIAGNOSIS — E785 Hyperlipidemia, unspecified: Secondary | ICD-10-CM | POA: Diagnosis not present

## 2022-02-12 DIAGNOSIS — M6281 Muscle weakness (generalized): Secondary | ICD-10-CM | POA: Diagnosis not present

## 2022-02-12 DIAGNOSIS — I110 Hypertensive heart disease with heart failure: Secondary | ICD-10-CM | POA: Diagnosis not present

## 2022-02-12 DIAGNOSIS — M6259 Muscle wasting and atrophy, not elsewhere classified, multiple sites: Secondary | ICD-10-CM | POA: Diagnosis not present

## 2022-02-12 DIAGNOSIS — R2681 Unsteadiness on feet: Secondary | ICD-10-CM | POA: Diagnosis not present

## 2022-02-12 DIAGNOSIS — R2689 Other abnormalities of gait and mobility: Secondary | ICD-10-CM | POA: Diagnosis not present

## 2022-02-12 DIAGNOSIS — E1169 Type 2 diabetes mellitus with other specified complication: Secondary | ICD-10-CM | POA: Diagnosis not present

## 2022-02-13 DIAGNOSIS — R2689 Other abnormalities of gait and mobility: Secondary | ICD-10-CM | POA: Diagnosis not present

## 2022-02-13 DIAGNOSIS — M6259 Muscle wasting and atrophy, not elsewhere classified, multiple sites: Secondary | ICD-10-CM | POA: Diagnosis not present

## 2022-02-13 DIAGNOSIS — E1169 Type 2 diabetes mellitus with other specified complication: Secondary | ICD-10-CM | POA: Diagnosis not present

## 2022-02-13 DIAGNOSIS — R2681 Unsteadiness on feet: Secondary | ICD-10-CM | POA: Diagnosis not present

## 2022-02-13 DIAGNOSIS — I110 Hypertensive heart disease with heart failure: Secondary | ICD-10-CM | POA: Diagnosis not present

## 2022-02-13 DIAGNOSIS — M6281 Muscle weakness (generalized): Secondary | ICD-10-CM | POA: Diagnosis not present

## 2022-02-13 DIAGNOSIS — E785 Hyperlipidemia, unspecified: Secondary | ICD-10-CM | POA: Diagnosis not present

## 2022-02-14 DIAGNOSIS — R2681 Unsteadiness on feet: Secondary | ICD-10-CM | POA: Diagnosis not present

## 2022-02-14 DIAGNOSIS — M6259 Muscle wasting and atrophy, not elsewhere classified, multiple sites: Secondary | ICD-10-CM | POA: Diagnosis not present

## 2022-02-14 DIAGNOSIS — R2689 Other abnormalities of gait and mobility: Secondary | ICD-10-CM | POA: Diagnosis not present

## 2022-02-14 DIAGNOSIS — E785 Hyperlipidemia, unspecified: Secondary | ICD-10-CM | POA: Diagnosis not present

## 2022-02-14 DIAGNOSIS — M6281 Muscle weakness (generalized): Secondary | ICD-10-CM | POA: Diagnosis not present

## 2022-02-14 DIAGNOSIS — E1169 Type 2 diabetes mellitus with other specified complication: Secondary | ICD-10-CM | POA: Diagnosis not present

## 2022-02-14 DIAGNOSIS — I110 Hypertensive heart disease with heart failure: Secondary | ICD-10-CM | POA: Diagnosis not present

## 2022-02-15 DIAGNOSIS — M6259 Muscle wasting and atrophy, not elsewhere classified, multiple sites: Secondary | ICD-10-CM | POA: Diagnosis not present

## 2022-02-15 DIAGNOSIS — R2689 Other abnormalities of gait and mobility: Secondary | ICD-10-CM | POA: Diagnosis not present

## 2022-02-15 DIAGNOSIS — E785 Hyperlipidemia, unspecified: Secondary | ICD-10-CM | POA: Diagnosis not present

## 2022-02-15 DIAGNOSIS — M6281 Muscle weakness (generalized): Secondary | ICD-10-CM | POA: Diagnosis not present

## 2022-02-15 DIAGNOSIS — I110 Hypertensive heart disease with heart failure: Secondary | ICD-10-CM | POA: Diagnosis not present

## 2022-02-15 DIAGNOSIS — R2681 Unsteadiness on feet: Secondary | ICD-10-CM | POA: Diagnosis not present

## 2022-02-15 DIAGNOSIS — E1169 Type 2 diabetes mellitus with other specified complication: Secondary | ICD-10-CM | POA: Diagnosis not present

## 2022-02-18 DIAGNOSIS — R2689 Other abnormalities of gait and mobility: Secondary | ICD-10-CM | POA: Diagnosis not present

## 2022-02-18 DIAGNOSIS — I110 Hypertensive heart disease with heart failure: Secondary | ICD-10-CM | POA: Diagnosis not present

## 2022-02-18 DIAGNOSIS — E1169 Type 2 diabetes mellitus with other specified complication: Secondary | ICD-10-CM | POA: Diagnosis not present

## 2022-02-18 DIAGNOSIS — M6281 Muscle weakness (generalized): Secondary | ICD-10-CM | POA: Diagnosis not present

## 2022-02-18 DIAGNOSIS — M6259 Muscle wasting and atrophy, not elsewhere classified, multiple sites: Secondary | ICD-10-CM | POA: Diagnosis not present

## 2022-02-18 DIAGNOSIS — E785 Hyperlipidemia, unspecified: Secondary | ICD-10-CM | POA: Diagnosis not present

## 2022-02-18 DIAGNOSIS — R2681 Unsteadiness on feet: Secondary | ICD-10-CM | POA: Diagnosis not present

## 2022-02-19 DIAGNOSIS — E785 Hyperlipidemia, unspecified: Secondary | ICD-10-CM | POA: Diagnosis not present

## 2022-02-19 DIAGNOSIS — R2689 Other abnormalities of gait and mobility: Secondary | ICD-10-CM | POA: Diagnosis not present

## 2022-02-19 DIAGNOSIS — M6259 Muscle wasting and atrophy, not elsewhere classified, multiple sites: Secondary | ICD-10-CM | POA: Diagnosis not present

## 2022-02-19 DIAGNOSIS — R2681 Unsteadiness on feet: Secondary | ICD-10-CM | POA: Diagnosis not present

## 2022-02-19 DIAGNOSIS — E1169 Type 2 diabetes mellitus with other specified complication: Secondary | ICD-10-CM | POA: Diagnosis not present

## 2022-02-19 DIAGNOSIS — I110 Hypertensive heart disease with heart failure: Secondary | ICD-10-CM | POA: Diagnosis not present

## 2022-02-19 DIAGNOSIS — M6281 Muscle weakness (generalized): Secondary | ICD-10-CM | POA: Diagnosis not present

## 2022-02-20 DIAGNOSIS — M6259 Muscle wasting and atrophy, not elsewhere classified, multiple sites: Secondary | ICD-10-CM | POA: Diagnosis not present

## 2022-02-20 DIAGNOSIS — M6281 Muscle weakness (generalized): Secondary | ICD-10-CM | POA: Diagnosis not present

## 2022-02-20 DIAGNOSIS — I10 Essential (primary) hypertension: Secondary | ICD-10-CM | POA: Diagnosis not present

## 2022-02-20 DIAGNOSIS — R2681 Unsteadiness on feet: Secondary | ICD-10-CM | POA: Diagnosis not present

## 2022-02-20 DIAGNOSIS — E785 Hyperlipidemia, unspecified: Secondary | ICD-10-CM | POA: Diagnosis not present

## 2022-02-20 DIAGNOSIS — E1169 Type 2 diabetes mellitus with other specified complication: Secondary | ICD-10-CM | POA: Diagnosis not present

## 2022-02-20 DIAGNOSIS — I110 Hypertensive heart disease with heart failure: Secondary | ICD-10-CM | POA: Diagnosis not present

## 2022-02-20 DIAGNOSIS — E119 Type 2 diabetes mellitus without complications: Secondary | ICD-10-CM | POA: Diagnosis not present

## 2022-02-20 DIAGNOSIS — R2689 Other abnormalities of gait and mobility: Secondary | ICD-10-CM | POA: Diagnosis not present

## 2022-02-21 DIAGNOSIS — R2681 Unsteadiness on feet: Secondary | ICD-10-CM | POA: Diagnosis not present

## 2022-02-21 DIAGNOSIS — R1310 Dysphagia, unspecified: Secondary | ICD-10-CM | POA: Diagnosis not present

## 2022-02-21 DIAGNOSIS — E1169 Type 2 diabetes mellitus with other specified complication: Secondary | ICD-10-CM | POA: Diagnosis not present

## 2022-02-21 DIAGNOSIS — M6259 Muscle wasting and atrophy, not elsewhere classified, multiple sites: Secondary | ICD-10-CM | POA: Diagnosis not present

## 2022-02-21 DIAGNOSIS — R2689 Other abnormalities of gait and mobility: Secondary | ICD-10-CM | POA: Diagnosis not present

## 2022-02-21 DIAGNOSIS — E785 Hyperlipidemia, unspecified: Secondary | ICD-10-CM | POA: Diagnosis not present

## 2022-02-21 DIAGNOSIS — I110 Hypertensive heart disease with heart failure: Secondary | ICD-10-CM | POA: Diagnosis not present

## 2022-02-21 DIAGNOSIS — M6281 Muscle weakness (generalized): Secondary | ICD-10-CM | POA: Diagnosis not present

## 2022-02-24 DIAGNOSIS — M6281 Muscle weakness (generalized): Secondary | ICD-10-CM | POA: Diagnosis not present

## 2022-02-24 DIAGNOSIS — R2689 Other abnormalities of gait and mobility: Secondary | ICD-10-CM | POA: Diagnosis not present

## 2022-02-24 DIAGNOSIS — M6259 Muscle wasting and atrophy, not elsewhere classified, multiple sites: Secondary | ICD-10-CM | POA: Diagnosis not present

## 2022-02-24 DIAGNOSIS — I110 Hypertensive heart disease with heart failure: Secondary | ICD-10-CM | POA: Diagnosis not present

## 2022-02-24 DIAGNOSIS — R2681 Unsteadiness on feet: Secondary | ICD-10-CM | POA: Diagnosis not present

## 2022-02-24 DIAGNOSIS — R1310 Dysphagia, unspecified: Secondary | ICD-10-CM | POA: Diagnosis not present

## 2022-02-24 DIAGNOSIS — E1169 Type 2 diabetes mellitus with other specified complication: Secondary | ICD-10-CM | POA: Diagnosis not present

## 2022-02-24 DIAGNOSIS — E785 Hyperlipidemia, unspecified: Secondary | ICD-10-CM | POA: Diagnosis not present

## 2022-02-25 DIAGNOSIS — R2689 Other abnormalities of gait and mobility: Secondary | ICD-10-CM | POA: Diagnosis not present

## 2022-02-25 DIAGNOSIS — R2681 Unsteadiness on feet: Secondary | ICD-10-CM | POA: Diagnosis not present

## 2022-02-25 DIAGNOSIS — I110 Hypertensive heart disease with heart failure: Secondary | ICD-10-CM | POA: Diagnosis not present

## 2022-02-25 DIAGNOSIS — E785 Hyperlipidemia, unspecified: Secondary | ICD-10-CM | POA: Diagnosis not present

## 2022-02-25 DIAGNOSIS — R1310 Dysphagia, unspecified: Secondary | ICD-10-CM | POA: Diagnosis not present

## 2022-02-25 DIAGNOSIS — E1169 Type 2 diabetes mellitus with other specified complication: Secondary | ICD-10-CM | POA: Diagnosis not present

## 2022-02-25 DIAGNOSIS — M6281 Muscle weakness (generalized): Secondary | ICD-10-CM | POA: Diagnosis not present

## 2022-02-25 DIAGNOSIS — M6259 Muscle wasting and atrophy, not elsewhere classified, multiple sites: Secondary | ICD-10-CM | POA: Diagnosis not present

## 2022-02-26 DIAGNOSIS — R2681 Unsteadiness on feet: Secondary | ICD-10-CM | POA: Diagnosis not present

## 2022-02-26 DIAGNOSIS — M6259 Muscle wasting and atrophy, not elsewhere classified, multiple sites: Secondary | ICD-10-CM | POA: Diagnosis not present

## 2022-02-26 DIAGNOSIS — R2689 Other abnormalities of gait and mobility: Secondary | ICD-10-CM | POA: Diagnosis not present

## 2022-02-26 DIAGNOSIS — M6281 Muscle weakness (generalized): Secondary | ICD-10-CM | POA: Diagnosis not present

## 2022-02-26 DIAGNOSIS — E785 Hyperlipidemia, unspecified: Secondary | ICD-10-CM | POA: Diagnosis not present

## 2022-02-26 DIAGNOSIS — R1310 Dysphagia, unspecified: Secondary | ICD-10-CM | POA: Diagnosis not present

## 2022-02-26 DIAGNOSIS — I110 Hypertensive heart disease with heart failure: Secondary | ICD-10-CM | POA: Diagnosis not present

## 2022-02-26 DIAGNOSIS — E1169 Type 2 diabetes mellitus with other specified complication: Secondary | ICD-10-CM | POA: Diagnosis not present

## 2022-02-27 DIAGNOSIS — K219 Gastro-esophageal reflux disease without esophagitis: Secondary | ICD-10-CM | POA: Diagnosis not present

## 2022-02-27 DIAGNOSIS — I110 Hypertensive heart disease with heart failure: Secondary | ICD-10-CM | POA: Diagnosis not present

## 2022-02-27 DIAGNOSIS — M6259 Muscle wasting and atrophy, not elsewhere classified, multiple sites: Secondary | ICD-10-CM | POA: Diagnosis not present

## 2022-02-27 DIAGNOSIS — R195 Other fecal abnormalities: Secondary | ICD-10-CM | POA: Diagnosis not present

## 2022-02-27 DIAGNOSIS — R1310 Dysphagia, unspecified: Secondary | ICD-10-CM | POA: Diagnosis not present

## 2022-02-27 DIAGNOSIS — I10 Essential (primary) hypertension: Secondary | ICD-10-CM | POA: Diagnosis not present

## 2022-02-27 DIAGNOSIS — R109 Unspecified abdominal pain: Secondary | ICD-10-CM | POA: Diagnosis not present

## 2022-02-27 DIAGNOSIS — R2689 Other abnormalities of gait and mobility: Secondary | ICD-10-CM | POA: Diagnosis not present

## 2022-02-27 DIAGNOSIS — R2681 Unsteadiness on feet: Secondary | ICD-10-CM | POA: Diagnosis not present

## 2022-02-27 DIAGNOSIS — W19XXXD Unspecified fall, subsequent encounter: Secondary | ICD-10-CM | POA: Diagnosis not present

## 2022-02-27 DIAGNOSIS — E1169 Type 2 diabetes mellitus with other specified complication: Secondary | ICD-10-CM | POA: Diagnosis not present

## 2022-02-27 DIAGNOSIS — M6281 Muscle weakness (generalized): Secondary | ICD-10-CM | POA: Diagnosis not present

## 2022-02-27 DIAGNOSIS — E785 Hyperlipidemia, unspecified: Secondary | ICD-10-CM | POA: Diagnosis not present

## 2022-02-28 DIAGNOSIS — E7841 Elevated Lipoprotein(a): Secondary | ICD-10-CM | POA: Diagnosis not present

## 2022-02-28 DIAGNOSIS — R509 Fever, unspecified: Secondary | ICD-10-CM | POA: Diagnosis not present

## 2022-02-28 DIAGNOSIS — E119 Type 2 diabetes mellitus without complications: Secondary | ICD-10-CM | POA: Diagnosis not present

## 2022-02-28 DIAGNOSIS — D649 Anemia, unspecified: Secondary | ICD-10-CM | POA: Diagnosis not present

## 2022-03-01 DIAGNOSIS — M6259 Muscle wasting and atrophy, not elsewhere classified, multiple sites: Secondary | ICD-10-CM | POA: Diagnosis not present

## 2022-03-01 DIAGNOSIS — I110 Hypertensive heart disease with heart failure: Secondary | ICD-10-CM | POA: Diagnosis not present

## 2022-03-01 DIAGNOSIS — R2681 Unsteadiness on feet: Secondary | ICD-10-CM | POA: Diagnosis not present

## 2022-03-01 DIAGNOSIS — E1169 Type 2 diabetes mellitus with other specified complication: Secondary | ICD-10-CM | POA: Diagnosis not present

## 2022-03-01 DIAGNOSIS — E785 Hyperlipidemia, unspecified: Secondary | ICD-10-CM | POA: Diagnosis not present

## 2022-03-01 DIAGNOSIS — R2689 Other abnormalities of gait and mobility: Secondary | ICD-10-CM | POA: Diagnosis not present

## 2022-03-01 DIAGNOSIS — E1122 Type 2 diabetes mellitus with diabetic chronic kidney disease: Secondary | ICD-10-CM | POA: Diagnosis not present

## 2022-03-01 DIAGNOSIS — R1310 Dysphagia, unspecified: Secondary | ICD-10-CM | POA: Diagnosis not present

## 2022-03-01 DIAGNOSIS — M6281 Muscle weakness (generalized): Secondary | ICD-10-CM | POA: Diagnosis not present

## 2022-03-04 DIAGNOSIS — R0981 Nasal congestion: Secondary | ICD-10-CM | POA: Diagnosis not present

## 2022-03-04 DIAGNOSIS — E119 Type 2 diabetes mellitus without complications: Secondary | ICD-10-CM | POA: Diagnosis not present

## 2022-03-04 DIAGNOSIS — K5909 Other constipation: Secondary | ICD-10-CM | POA: Diagnosis not present

## 2022-03-04 DIAGNOSIS — I10 Essential (primary) hypertension: Secondary | ICD-10-CM | POA: Diagnosis not present

## 2022-03-05 DIAGNOSIS — M6281 Muscle weakness (generalized): Secondary | ICD-10-CM | POA: Diagnosis not present

## 2022-03-05 DIAGNOSIS — R2681 Unsteadiness on feet: Secondary | ICD-10-CM | POA: Diagnosis not present

## 2022-03-05 DIAGNOSIS — R051 Acute cough: Secondary | ICD-10-CM | POA: Diagnosis not present

## 2022-03-05 DIAGNOSIS — M6259 Muscle wasting and atrophy, not elsewhere classified, multiple sites: Secondary | ICD-10-CM | POA: Diagnosis not present

## 2022-03-05 DIAGNOSIS — R1310 Dysphagia, unspecified: Secondary | ICD-10-CM | POA: Diagnosis not present

## 2022-03-05 DIAGNOSIS — E1169 Type 2 diabetes mellitus with other specified complication: Secondary | ICD-10-CM | POA: Diagnosis not present

## 2022-03-05 DIAGNOSIS — E785 Hyperlipidemia, unspecified: Secondary | ICD-10-CM | POA: Diagnosis not present

## 2022-03-05 DIAGNOSIS — I110 Hypertensive heart disease with heart failure: Secondary | ICD-10-CM | POA: Diagnosis not present

## 2022-03-05 DIAGNOSIS — R2689 Other abnormalities of gait and mobility: Secondary | ICD-10-CM | POA: Diagnosis not present

## 2022-03-06 DIAGNOSIS — I10 Essential (primary) hypertension: Secondary | ICD-10-CM | POA: Diagnosis not present

## 2022-03-06 DIAGNOSIS — E119 Type 2 diabetes mellitus without complications: Secondary | ICD-10-CM | POA: Diagnosis not present

## 2022-03-06 DIAGNOSIS — R0981 Nasal congestion: Secondary | ICD-10-CM | POA: Diagnosis not present

## 2022-03-08 DIAGNOSIS — I129 Hypertensive chronic kidney disease with stage 1 through stage 4 chronic kidney disease, or unspecified chronic kidney disease: Secondary | ICD-10-CM | POA: Diagnosis not present

## 2022-03-08 DIAGNOSIS — E1169 Type 2 diabetes mellitus with other specified complication: Secondary | ICD-10-CM | POA: Diagnosis not present

## 2022-03-15 ENCOUNTER — Encounter: Payer: Self-pay | Admitting: Podiatry

## 2022-03-15 ENCOUNTER — Ambulatory Visit (INDEPENDENT_AMBULATORY_CARE_PROVIDER_SITE_OTHER): Payer: Medicare Other | Admitting: Podiatry

## 2022-03-15 DIAGNOSIS — E1159 Type 2 diabetes mellitus with other circulatory complications: Secondary | ICD-10-CM

## 2022-03-15 DIAGNOSIS — I129 Hypertensive chronic kidney disease with stage 1 through stage 4 chronic kidney disease, or unspecified chronic kidney disease: Secondary | ICD-10-CM | POA: Diagnosis not present

## 2022-03-15 DIAGNOSIS — M79609 Pain in unspecified limb: Secondary | ICD-10-CM | POA: Diagnosis not present

## 2022-03-15 DIAGNOSIS — B351 Tinea unguium: Secondary | ICD-10-CM | POA: Diagnosis not present

## 2022-03-15 DIAGNOSIS — K219 Gastro-esophageal reflux disease without esophagitis: Secondary | ICD-10-CM | POA: Diagnosis not present

## 2022-03-15 DIAGNOSIS — E119 Type 2 diabetes mellitus without complications: Secondary | ICD-10-CM | POA: Diagnosis not present

## 2022-03-15 NOTE — Progress Notes (Signed)
This patient returns to my office for at risk foot care.  This patient requires this care by a professional since this patient will be at risk due to having CKD and diabetes.  This patient is unable to cut nails himself since the patient cannot reach his nails.These nails are painful walking and wearing shoes. This patient presents to the office in a wheelchair.  This patient presents for at risk foot care today.  General Appearance  Alert, conversant and in no acute stress.  Vascular  Dorsalis pedis and posterior tibial  pulses are weakly  palpable  bilaterally.  Capillary return is within normal limits  bilaterally. Cold feet  Bilaterally. Absent digital hair B/L.  Neurologic  Senn-Weinstein monofilament wire test diminished   bilaterally. Muscle power within normal limits bilaterally.  Nails Thick disfigured discolored nails with subungual debris  from hallux to fifth toes bilaterally. No evidence of bacterial infection or drainage bilaterally.  Orthopedic  No limitations of motion  feet .  No crepitus or effusions noted.  Contracted digits  B/L.  Skin  normotropic skin with no porokeratosis noted bilaterally.  No signs of infections or ulcers noted.     Onychomycosis  Pain in right toes  Pain in left toes  Consent was obtained for treatment procedures.   Mechanical debridement of nails 1-5  bilaterally performed with a nail nipper.    RTC 3 months.     Return office visit   3 months                  Told patient to return for periodic foot care and evaluation due to potential at risk complications.   Gardiner Barefoot DPM

## 2022-03-17 DIAGNOSIS — G4709 Other insomnia: Secondary | ICD-10-CM | POA: Diagnosis not present

## 2022-03-17 DIAGNOSIS — I129 Hypertensive chronic kidney disease with stage 1 through stage 4 chronic kidney disease, or unspecified chronic kidney disease: Secondary | ICD-10-CM | POA: Diagnosis not present

## 2022-03-17 DIAGNOSIS — K219 Gastro-esophageal reflux disease without esophagitis: Secondary | ICD-10-CM | POA: Diagnosis not present

## 2022-03-17 DIAGNOSIS — N182 Chronic kidney disease, stage 2 (mild): Secondary | ICD-10-CM | POA: Diagnosis not present

## 2022-03-17 DIAGNOSIS — K5909 Other constipation: Secondary | ICD-10-CM | POA: Diagnosis not present

## 2022-03-17 DIAGNOSIS — R600 Localized edema: Secondary | ICD-10-CM | POA: Diagnosis not present

## 2022-03-20 DIAGNOSIS — E119 Type 2 diabetes mellitus without complications: Secondary | ICD-10-CM | POA: Diagnosis not present

## 2022-03-20 DIAGNOSIS — I129 Hypertensive chronic kidney disease with stage 1 through stage 4 chronic kidney disease, or unspecified chronic kidney disease: Secondary | ICD-10-CM | POA: Diagnosis not present

## 2022-03-20 DIAGNOSIS — K219 Gastro-esophageal reflux disease without esophagitis: Secondary | ICD-10-CM | POA: Diagnosis not present

## 2022-03-28 DIAGNOSIS — I129 Hypertensive chronic kidney disease with stage 1 through stage 4 chronic kidney disease, or unspecified chronic kidney disease: Secondary | ICD-10-CM | POA: Diagnosis not present

## 2022-03-28 DIAGNOSIS — E119 Type 2 diabetes mellitus without complications: Secondary | ICD-10-CM | POA: Diagnosis not present

## 2022-03-28 DIAGNOSIS — K219 Gastro-esophageal reflux disease without esophagitis: Secondary | ICD-10-CM | POA: Diagnosis not present

## 2022-04-04 DIAGNOSIS — I129 Hypertensive chronic kidney disease with stage 1 through stage 4 chronic kidney disease, or unspecified chronic kidney disease: Secondary | ICD-10-CM | POA: Diagnosis not present

## 2022-04-04 DIAGNOSIS — K219 Gastro-esophageal reflux disease without esophagitis: Secondary | ICD-10-CM | POA: Diagnosis not present

## 2022-04-04 DIAGNOSIS — E119 Type 2 diabetes mellitus without complications: Secondary | ICD-10-CM | POA: Diagnosis not present

## 2022-04-06 DIAGNOSIS — N182 Chronic kidney disease, stage 2 (mild): Secondary | ICD-10-CM | POA: Diagnosis not present

## 2022-04-06 DIAGNOSIS — G4709 Other insomnia: Secondary | ICD-10-CM | POA: Diagnosis not present

## 2022-04-06 DIAGNOSIS — K219 Gastro-esophageal reflux disease without esophagitis: Secondary | ICD-10-CM | POA: Diagnosis not present

## 2022-04-06 DIAGNOSIS — I129 Hypertensive chronic kidney disease with stage 1 through stage 4 chronic kidney disease, or unspecified chronic kidney disease: Secondary | ICD-10-CM | POA: Diagnosis not present

## 2022-04-06 DIAGNOSIS — K5909 Other constipation: Secondary | ICD-10-CM | POA: Diagnosis not present

## 2022-04-09 DIAGNOSIS — G4709 Other insomnia: Secondary | ICD-10-CM | POA: Diagnosis not present

## 2022-04-09 DIAGNOSIS — K219 Gastro-esophageal reflux disease without esophagitis: Secondary | ICD-10-CM | POA: Diagnosis not present

## 2022-04-09 DIAGNOSIS — E119 Type 2 diabetes mellitus without complications: Secondary | ICD-10-CM | POA: Diagnosis not present

## 2022-04-09 DIAGNOSIS — I129 Hypertensive chronic kidney disease with stage 1 through stage 4 chronic kidney disease, or unspecified chronic kidney disease: Secondary | ICD-10-CM | POA: Diagnosis not present

## 2022-04-24 DIAGNOSIS — I129 Hypertensive chronic kidney disease with stage 1 through stage 4 chronic kidney disease, or unspecified chronic kidney disease: Secondary | ICD-10-CM | POA: Diagnosis not present

## 2022-04-24 DIAGNOSIS — K219 Gastro-esophageal reflux disease without esophagitis: Secondary | ICD-10-CM | POA: Diagnosis not present

## 2022-04-24 DIAGNOSIS — E119 Type 2 diabetes mellitus without complications: Secondary | ICD-10-CM | POA: Diagnosis not present

## 2022-04-24 DIAGNOSIS — W19XXXD Unspecified fall, subsequent encounter: Secondary | ICD-10-CM | POA: Diagnosis not present

## 2022-05-01 DIAGNOSIS — I129 Hypertensive chronic kidney disease with stage 1 through stage 4 chronic kidney disease, or unspecified chronic kidney disease: Secondary | ICD-10-CM | POA: Diagnosis not present

## 2022-05-01 DIAGNOSIS — E119 Type 2 diabetes mellitus without complications: Secondary | ICD-10-CM | POA: Diagnosis not present

## 2022-05-01 DIAGNOSIS — K219 Gastro-esophageal reflux disease without esophagitis: Secondary | ICD-10-CM | POA: Diagnosis not present

## 2022-05-04 DIAGNOSIS — K219 Gastro-esophageal reflux disease without esophagitis: Secondary | ICD-10-CM | POA: Diagnosis not present

## 2022-05-04 DIAGNOSIS — K5909 Other constipation: Secondary | ICD-10-CM | POA: Diagnosis not present

## 2022-05-04 DIAGNOSIS — E1169 Type 2 diabetes mellitus with other specified complication: Secondary | ICD-10-CM | POA: Diagnosis not present

## 2022-05-04 DIAGNOSIS — I129 Hypertensive chronic kidney disease with stage 1 through stage 4 chronic kidney disease, or unspecified chronic kidney disease: Secondary | ICD-10-CM | POA: Diagnosis not present

## 2022-05-13 DIAGNOSIS — E119 Type 2 diabetes mellitus without complications: Secondary | ICD-10-CM | POA: Diagnosis not present

## 2022-05-13 DIAGNOSIS — K219 Gastro-esophageal reflux disease without esophagitis: Secondary | ICD-10-CM | POA: Diagnosis not present

## 2022-05-13 DIAGNOSIS — I129 Hypertensive chronic kidney disease with stage 1 through stage 4 chronic kidney disease, or unspecified chronic kidney disease: Secondary | ICD-10-CM | POA: Diagnosis not present

## 2022-05-21 DIAGNOSIS — E119 Type 2 diabetes mellitus without complications: Secondary | ICD-10-CM | POA: Diagnosis not present

## 2022-05-21 DIAGNOSIS — I129 Hypertensive chronic kidney disease with stage 1 through stage 4 chronic kidney disease, or unspecified chronic kidney disease: Secondary | ICD-10-CM | POA: Diagnosis not present

## 2022-05-22 DIAGNOSIS — E119 Type 2 diabetes mellitus without complications: Secondary | ICD-10-CM | POA: Diagnosis not present

## 2022-05-23 DIAGNOSIS — E119 Type 2 diabetes mellitus without complications: Secondary | ICD-10-CM | POA: Diagnosis not present

## 2022-05-23 DIAGNOSIS — K219 Gastro-esophageal reflux disease without esophagitis: Secondary | ICD-10-CM | POA: Diagnosis not present

## 2022-05-23 DIAGNOSIS — I129 Hypertensive chronic kidney disease with stage 1 through stage 4 chronic kidney disease, or unspecified chronic kidney disease: Secondary | ICD-10-CM | POA: Diagnosis not present

## 2022-05-28 DIAGNOSIS — I129 Hypertensive chronic kidney disease with stage 1 through stage 4 chronic kidney disease, or unspecified chronic kidney disease: Secondary | ICD-10-CM | POA: Diagnosis not present

## 2022-05-28 DIAGNOSIS — G4709 Other insomnia: Secondary | ICD-10-CM | POA: Diagnosis not present

## 2022-05-28 DIAGNOSIS — E119 Type 2 diabetes mellitus without complications: Secondary | ICD-10-CM | POA: Diagnosis not present

## 2022-05-28 DIAGNOSIS — N3281 Overactive bladder: Secondary | ICD-10-CM | POA: Diagnosis not present

## 2022-06-04 DIAGNOSIS — K219 Gastro-esophageal reflux disease without esophagitis: Secondary | ICD-10-CM | POA: Diagnosis not present

## 2022-06-04 DIAGNOSIS — I129 Hypertensive chronic kidney disease with stage 1 through stage 4 chronic kidney disease, or unspecified chronic kidney disease: Secondary | ICD-10-CM | POA: Diagnosis not present

## 2022-06-04 DIAGNOSIS — E119 Type 2 diabetes mellitus without complications: Secondary | ICD-10-CM | POA: Diagnosis not present

## 2022-06-11 DIAGNOSIS — E119 Type 2 diabetes mellitus without complications: Secondary | ICD-10-CM | POA: Diagnosis not present

## 2022-06-11 DIAGNOSIS — K219 Gastro-esophageal reflux disease without esophagitis: Secondary | ICD-10-CM | POA: Diagnosis not present

## 2022-06-11 DIAGNOSIS — I129 Hypertensive chronic kidney disease with stage 1 through stage 4 chronic kidney disease, or unspecified chronic kidney disease: Secondary | ICD-10-CM | POA: Diagnosis not present

## 2022-06-14 ENCOUNTER — Encounter: Payer: Self-pay | Admitting: Podiatry

## 2022-06-14 ENCOUNTER — Ambulatory Visit (INDEPENDENT_AMBULATORY_CARE_PROVIDER_SITE_OTHER): Payer: Medicare Other | Admitting: Podiatry

## 2022-06-14 DIAGNOSIS — B351 Tinea unguium: Secondary | ICD-10-CM

## 2022-06-14 DIAGNOSIS — E1159 Type 2 diabetes mellitus with other circulatory complications: Secondary | ICD-10-CM

## 2022-06-14 DIAGNOSIS — M79609 Pain in unspecified limb: Secondary | ICD-10-CM

## 2022-06-14 DIAGNOSIS — E1122 Type 2 diabetes mellitus with diabetic chronic kidney disease: Secondary | ICD-10-CM | POA: Diagnosis not present

## 2022-06-14 NOTE — Progress Notes (Signed)
This patient returns to my office for at risk foot care.  This patient requires this care by a professional since this patient will be at risk due to having CKD and diabetes.  This patient is unable to cut nails himself since the patient cannot reach his nails.These nails are painful walking and wearing shoes. This patient presents to the office in a wheelchair.  This patient presents for at risk foot care today.  General Appearance  Alert, conversant and in no acute stress.  Vascular  Dorsalis pedis and posterior tibial  pulses are weakly  palpable  bilaterally.  Capillary return is within normal limits  bilaterally. Cold feet  Bilaterally. Absent digital hair B/L.  Neurologic  Senn-Weinstein monofilament wire test diminished   bilaterally. Muscle power within normal limits bilaterally.  Nails Thick disfigured discolored nails with subungual debris  from hallux to fifth toes bilaterally. No evidence of bacterial infection or drainage bilaterally.  Orthopedic  No limitations of motion  feet .  No crepitus or effusions noted.  Contracted digits  B/L.  Skin  normotropic skin with no porokeratosis noted bilaterally.  No signs of infections or ulcers noted.     Onychomycosis  Pain in right toes  Pain in left toes  Consent was obtained for treatment procedures.   Mechanical debridement of nails 1-5  bilaterally performed with a nail nipper.    RTC 3 months.     Return office visit   3 months                  Told patient to return for periodic foot care and evaluation due to potential at risk complications.   Stephanny Tsutsui DPM  

## 2022-06-15 DIAGNOSIS — K5909 Other constipation: Secondary | ICD-10-CM | POA: Diagnosis not present

## 2022-06-15 DIAGNOSIS — I129 Hypertensive chronic kidney disease with stage 1 through stage 4 chronic kidney disease, or unspecified chronic kidney disease: Secondary | ICD-10-CM | POA: Diagnosis not present

## 2022-06-15 DIAGNOSIS — K219 Gastro-esophageal reflux disease without esophagitis: Secondary | ICD-10-CM | POA: Diagnosis not present

## 2022-06-15 DIAGNOSIS — E1169 Type 2 diabetes mellitus with other specified complication: Secondary | ICD-10-CM | POA: Diagnosis not present

## 2022-06-15 DIAGNOSIS — R600 Localized edema: Secondary | ICD-10-CM | POA: Diagnosis not present

## 2022-06-18 DIAGNOSIS — I129 Hypertensive chronic kidney disease with stage 1 through stage 4 chronic kidney disease, or unspecified chronic kidney disease: Secondary | ICD-10-CM | POA: Diagnosis not present

## 2022-06-18 DIAGNOSIS — E119 Type 2 diabetes mellitus without complications: Secondary | ICD-10-CM | POA: Diagnosis not present

## 2022-06-18 DIAGNOSIS — L02511 Cutaneous abscess of right hand: Secondary | ICD-10-CM | POA: Diagnosis not present

## 2022-06-26 DIAGNOSIS — L02511 Cutaneous abscess of right hand: Secondary | ICD-10-CM | POA: Diagnosis not present

## 2022-06-26 DIAGNOSIS — I129 Hypertensive chronic kidney disease with stage 1 through stage 4 chronic kidney disease, or unspecified chronic kidney disease: Secondary | ICD-10-CM | POA: Diagnosis not present

## 2022-06-26 DIAGNOSIS — E119 Type 2 diabetes mellitus without complications: Secondary | ICD-10-CM | POA: Diagnosis not present

## 2022-07-04 DIAGNOSIS — E785 Hyperlipidemia, unspecified: Secondary | ICD-10-CM | POA: Diagnosis not present

## 2022-07-04 DIAGNOSIS — I10 Essential (primary) hypertension: Secondary | ICD-10-CM | POA: Diagnosis not present

## 2022-07-04 DIAGNOSIS — E169 Disorder of pancreatic internal secretion, unspecified: Secondary | ICD-10-CM | POA: Diagnosis not present

## 2022-07-10 DIAGNOSIS — E119 Type 2 diabetes mellitus without complications: Secondary | ICD-10-CM | POA: Diagnosis not present

## 2022-07-10 DIAGNOSIS — K219 Gastro-esophageal reflux disease without esophagitis: Secondary | ICD-10-CM | POA: Diagnosis not present

## 2022-07-10 DIAGNOSIS — I129 Hypertensive chronic kidney disease with stage 1 through stage 4 chronic kidney disease, or unspecified chronic kidney disease: Secondary | ICD-10-CM | POA: Diagnosis not present

## 2022-07-11 DIAGNOSIS — N390 Urinary tract infection, site not specified: Secondary | ICD-10-CM | POA: Diagnosis not present

## 2022-07-15 ENCOUNTER — Ambulatory Visit (INDEPENDENT_AMBULATORY_CARE_PROVIDER_SITE_OTHER): Payer: Medicare Other | Admitting: Psychiatry

## 2022-07-15 ENCOUNTER — Encounter: Payer: Self-pay | Admitting: Psychiatry

## 2022-07-15 DIAGNOSIS — G301 Alzheimer's disease with late onset: Secondary | ICD-10-CM | POA: Diagnosis not present

## 2022-07-15 DIAGNOSIS — F411 Generalized anxiety disorder: Secondary | ICD-10-CM | POA: Diagnosis not present

## 2022-07-15 DIAGNOSIS — N3281 Overactive bladder: Secondary | ICD-10-CM | POA: Diagnosis not present

## 2022-07-15 DIAGNOSIS — K219 Gastro-esophageal reflux disease without esophagitis: Secondary | ICD-10-CM | POA: Diagnosis not present

## 2022-07-15 DIAGNOSIS — F02818 Dementia in other diseases classified elsewhere, unspecified severity, with other behavioral disturbance: Secondary | ICD-10-CM

## 2022-07-15 DIAGNOSIS — F200 Paranoid schizophrenia: Secondary | ICD-10-CM

## 2022-07-15 DIAGNOSIS — E119 Type 2 diabetes mellitus without complications: Secondary | ICD-10-CM | POA: Diagnosis not present

## 2022-07-15 DIAGNOSIS — F5105 Insomnia due to other mental disorder: Secondary | ICD-10-CM

## 2022-07-15 DIAGNOSIS — F422 Mixed obsessional thoughts and acts: Secondary | ICD-10-CM

## 2022-07-15 DIAGNOSIS — I129 Hypertensive chronic kidney disease with stage 1 through stage 4 chronic kidney disease, or unspecified chronic kidney disease: Secondary | ICD-10-CM | POA: Diagnosis not present

## 2022-07-15 NOTE — Progress Notes (Signed)
Ricky [name] [id number] 1940/11/04 82 y.o.     Subjective:   Patient ID:  Ricky [name]. is a 82 y.o. (DOB [birth year]) male.  Chief Complaint:  Chief Complaint  Patient presents with   Follow-up   Anxiety   Stress    health    Depression        Associated symptoms include decreased concentration and fatigue.  Associated symptoms include no headaches and no suicidal ideas.  Past medical history includes anxiety.   Anxiety Symptoms include confusion, decreased concentration, dizziness and nervous/anxious behavior. Patient reports no chest pain, palpitations or suicidal ideas.     Ricky [name]. presents to the office today for follow-up of schizophrenia and dementia.  Resident of [facility].  seen April 15, 2019.  No meds were changed.  As of 06/03/2019 the following is noted: Staff reports patient calls very frequently and has for an extended period of time.  He is chronically somewhat needy and lonely as well as has a tendency for reassurance checking. Still bothered by religious thoughts and can't tell if it's from God than or Satan.  Supernatural things get in his mind.  Also thinks that people there don't like him at [facility].  Also upset at Biden's spending excess.  Wants to vote for [employer].  Also bothered he doesn't get to do much.    Had thoughts he needed to go to the hospital one night for MH reasons but staff encouraged him to wait it out and he felt better the next day. Poor STM. No med changes  9/20/21appt with the following noted: Worries over losing trusted banker.  Worries money is running out.  Worries about getting Covid.  Memory is worse.  Gets obsessive fears about his salvation especially  Listening to [place] radio at times but has chronically worried over it.   Hears voices talking about his mental health and to listen to radio.   Don't have a whole lot of them but enough to get him upset from time to time.. Not markedly  depressed. Plan no med changes  02/21/2020 appointment with the following noted: [place] some noise, ring in his head but not voices. Worries about it. They take good care of me over there.  Listens to radio a lot at night. And doesn't get a lot of sleep at night but will nap daytime. Reads bible but often doesn't understand or remember it. No SE. Calls on [facility] to help him emotionally and spiritually.   LTM good.  More poor STM.    Wonders if it's possible that he'll ever get well.    Watched NFL football yesterday and enjoyed it. Plan: No med changes  07/19/2020 appointment with the following noted: Has had to call couple times after hours bc was ruminating on past issues.  Today feels OK. Recognizes problems with STM but in his heart feels it will get better. Food good at [employer].  People are treating him well except with one man.  [name] avoids him. Still on olanzapine 25 mg HS and fluovxamine 100, zolpidem 5 HS, Xanax 0.25 mg prn. Can't afford caregiver to take him out daily as in the past. Wonders what doc thinks his IQ would have been.   No concerns about med except wants one to make him well.  01/18/2021 appt noted: Still at [facility].  Feels OK about it.   Still ups and downs but overall holding his own.  Doesn't feel 82 yo.  WC bound but can still move himself around independently.  U incontinence. Getting along pretty well with people, but times thinks people don't like him.  Still wonders if he needs psych hospitalization. Notices memory problems. Still worries chronically over his salvation but not continuous and anxiety is no worse. Voices are better but still has intermittent AH.   CO trouble staying asleep but at times can sleep too much No SE noted Doing PT to try and get stronger. Plan: No med changes today. Successfully reduced alprazolam to 0.25 mg HS Continue olanzapine 25 mg HS for psychosis Continue fluvoxamine 100 mg HS for OCD and anxiety Continue zolpidem  5 mg HS for sleep Consider stopping alprazolam 0.25 mg HS as long as he's sleeping well. Or reducing to 0.125 mg HS.  Dose is already low  07/19/2021 appointment with the following noted: Incontinent of urine in the office. He thinks overall he is doing better.  Not afraid at all.  Some worry.   Realizes he's here for 6 mos followup. But he does recognize some memory problems.  Sometimes has trouble with use of technology. Not much re: voices except hears noises at times that disturb him.   Not depressed.  Enjoys bingo. Asks for reassurance. No med concerns. Plan: No med changes today. Successfully reduced alprazolam to 0.25 mg HS Continue olanzapine 25 mg HS for psychosis Continue fluvoxamine 100 mg HS for OCD and anxiety Continue zolpidem 5 mg HS for sleep Consider stopping alprazolam 0.25 mg HS as long as he's sleeping well. Or reducing to 0.125 mg HS.  Dose is already low  01/17/22 appt noted: Reduced alpraZolam to 0.125 mg nightly and Ambien to 2.5 mg nightly.  He has continued the other medicines.   He's satisfied living at [facility]. "Do you think I've been improved?"  Not depressed.  Worry comes and goes. Listens to relaxing radio show at night. He's aware he is not making frequent calls to the office like he used to do. Acknowledges some memory issues. Not as bothered by [facility].   Some worry that he's run out of his trust fund money.   07/15/22 appt noted: More problems since here.  Had u incontinence in the [facility] today.  This is a common problem.  Not wearing depends.   "It's been a long 6 mos".  Stressed. He is bored often.   Some trouble dealing with people but tries to make the best of it. Psych meds: off alprazolam.  Fluvoxamine 100 HS, olanzapine 25 mg HS. Zolpidem 2.5 mg HS No SE or med concerns. Some chronic difficulty dealing with people bc often thinks they might not like him.  Gets suspicious.  Past Psychiatric Medication Trials:  some of them are unknown,   haloperidol, Serentil, Stelazine, Moban, perphenazine, risperidone, Thorazine, Seroquel 800 mg, loxapine, been on Zyprexa since September 2006 and that has produced the best control of his paranoia at 25 mg daily. benztropine,    paroxetine, sertraline side effects,   Hydroxyzine, trazodone no response,  Xanax, zolpidem.  Review of Systems:  Review of Systems  Constitutional:  Positive for fatigue.  HENT:  Positive for tinnitus.   Cardiovascular:  Negative for chest pain and palpitations.  Genitourinary:  Positive for enuresis. Negative for flank pain.       Urinary incontinence  Musculoskeletal:  Positive for arthralgias, back pain and gait problem.  Neurological:  Positive for dizziness, tremors and weakness. Negative for headaches.       WC bound  Psychiatric/Behavioral:  Positive for confusion, decreased concentration, hallucinations and sleep disturbance. Negative for agitation, self-injury and suicidal ideas. The patient is nervous/anxious. The patient is not hyperactive.     Medications: I have reviewed the patient's current medications.  Current Outpatient Medications  Medication Sig Dispense Refill   amLODipine (NORVASC) 2.5 MG tablet      amLODipine (NORVASC) 5 MG tablet Take 5 mg by mouth daily.     Calcium Citrate-Vitamin D (CITRACAL + D PO) Take 1 tablet by mouth daily.     Cholecalciferol (VITAMIN D-3) 1000 units CAPS Take 1 capsule by mouth daily.     docusate sodium (COLACE) 100 MG capsule Take 100 mg by mouth 2 (two) times daily.     empagliflozin (JARDIANCE) 10 MG TABS tablet Take 10 mg by mouth daily. 2 tablet 0   fluvoxaMINE (LUVOX) 100 MG tablet Take 1 tablet (100 mg total) by mouth at bedtime. 2 tablet 0   furosemide (LASIX) 20 MG tablet Take 20 mg by mouth daily before breakfast.      HUMALOG KWIKPEN 100 UNIT/ML KiwkPen Inject 2-11 Units into the skin See admin instructions. Times: 0630 1130 1630  Scale: 101-150: 2 units 151-200: 3 units 201-250: 5  units 251-300: 7 units 301-350: 9 units >350: 11 units     insulin detemir (LEVEMIR) 100 UNIT/ML injection Inject 0.3 mLs (30 Units total) into the skin at bedtime. (Patient taking differently: Inject 24 Units into the skin at bedtime.) 10 mL 11   insulin glargine (LANTUS) 100 UNIT/ML injection 25 u     insulin regular (NOVOLIN R) 100 units/mL injection ss     meloxicam (MOBIC) 15 MG tablet 1 tablet     Menthol, Topical Analgesic, (BIOFREEZE) 4 % GEL Apply topically.     metFORMIN (GLUCOPHAGE) 500 MG tablet 1 tablet with meals     Multiple Vitamins-Minerals (THERAGRAN-M PREMIER 50 PLUS PO) Take 1 tablet by mouth daily.     MYRBETRIQ 25 MG TB24 tablet Take 25 mg by mouth daily.      OLANZapine (ZYPREXA) 5 MG tablet Take 5 tablets (25 mg total) by mouth daily. 2 tablet 0   Omega-3 Fatty Acids (FISH OIL) 1200 MG CAPS      omeprazole (PRILOSEC) 40 MG capsule Take 40 mg by mouth daily.      polyethylene glycol powder (GLYCOLAX/MIRALAX) 17 GM/SCOOP powder Take 1 Container by mouth once.     pravastatin (PRAVACHOL) 40 MG tablet Take 40 mg by mouth daily after lunch.      tamsulosin (FLOMAX) 0.4 MG CAPS capsule Take 0.4 mg by mouth daily.      traMADol (ULTRAM) 50 MG tablet Take 1 tablet (50 mg total) by mouth every 6 (six) hours as needed. 20 tablet 0   zolpidem (AMBIEN) 5 MG tablet Take 1 tablet (5 mg total) by mouth at bedtime. (Patient taking differently: Take 5 mg by mouth at bedtime. 1/2 tab) 2 tablet 0   No current facility-administered medications for this visit.    Medication Side Effects: Other: dry mouth  Allergies:  Allergies  Allergen Reactions   Asa [Aspirin]     "Dr told me not to take it"   Codeine Other (See Comments)    DIZZINESS with Tylenol 3   Tylenol With Codeine #3 [Acetaminophen-Codeine]     Other reaction(s): Unknown   Tylenol [Acetaminophen] Other (See Comments)    Dizziness with tylenol 3    Past Medical History:  Diagnosis Date   Abnormality of  gait     Adenomatous colon polyp    Arthritis    Cataract    Dementia (HCC)    Depression    Diabetes mellitus without complication (HCC)    Fatty liver    Hyperlipidemia    Hypertension    Internal hemorrhoids    Other malaise and fatigue    Peripheral edema    Schizophrenia (HCC)    Type II or unspecified type diabetes mellitus without mention of complication, not stated as uncontrolled    Urinary frequency    Urinary retention     Family History  Problem Relation Age of Onset   Brain cancer Father     Social History   Socioeconomic History   Marital status: Single    Spouse name: Not on file   Number of children: Not on file   Years of education: college   Highest education level: Not on file  Occupational History    Employer: RETIRED    Comment: Disabled  Tobacco Use   Smoking status: Never   Smokeless tobacco: Never  Substance and Sexual Activity   Alcohol use: No   Drug use: No   Sexual activity: Not on file  Other Topics Concern   Not on file  Social History Narrative   Patient is disabled and he has not worked since 1962. Patient has some college education.Patient drinks 2 cups of coffee daily.   Right handed.   Social Determinants of Health   Financial Resource Strain: Not on file  Food Insecurity: Not on file  Transportation Needs: Not on file  Physical Activity: Not on file  Stress: Not on file  Social Connections: Not on file  Intimate Partner Violence: Not on file    Past Medical History, Surgical history, Social history, and Family history were reviewed and updated as appropriate.   Please see review of systems for further details on the patient's review from today.   Objective:   Physical Exam:  There were no vitals taken for this visit.  Physical Exam Constitutional:      Appearance: He is obese.  Neurological:     Mental Status: He is alert and oriented to person, place, and time.     Cranial Nerves: Dysarthria present.     Motor:  Weakness present.     Gait: Gait abnormal.     Comments: Mild dysarthria chronic   Psychiatric:        Attention and Perception: Attention normal. He is attentive. He perceives auditory hallucinations. He does not perceive visual hallucinations.        Mood and Affect: Mood is anxious. Mood is not depressed. Affect is not angry or tearful.        Speech: Speech normal. Speech is not rapid and pressured or slurred.        Behavior: Behavior is slowed. Behavior is not agitated or aggressive. Behavior is cooperative.        Thought Content: Thought content is paranoid and delusional. Thought content does not include homicidal or suicidal ideation. Thought content does not include homicidal or suicidal plan.        Cognition and Memory: Cognition is impaired. Memory is impaired. He does not exhibit impaired recent memory.     Comments: Chronic voices better. AH is just a noise not voice when he does something he feels guilty about. Fair insight and judgment. Talkative and repeats himself some. Memory is stable.Ricky [name] words at times but decent fund of knowledge. Reassurance seeking  chronically. Some IOR re commericials on TV ongoing not disturbing. Obsessive spiritual thought and compulsive behaviors are less prominent  residual paranoid Affect calm.      Lab Review:     Component Value Date/Time   NA 139 01/22/2018 0339   K 3.6 01/22/2018 0339   CL 100 01/22/2018 0339   CO2 28 01/22/2018 0339   GLUCOSE 231 (H) 01/22/2018 0339   BUN 15 01/22/2018 0339   CREATININE 0.96 01/22/2018 0339   CALCIUM 9.2 01/22/2018 0339   PROT 7.0 01/22/2018 0339   ALBUMIN 3.3 (L) 01/22/2018 0339   AST 35 01/22/2018 0339   ALT 38 01/22/2018 0339   ALKPHOS 71 01/22/2018 0339   BILITOT 0.6 01/22/2018 0339   GFRNONAA >60 01/22/2018 0339   GFRAA >60 01/22/2018 0339       Component Value Date/Time   WBC 6.8 01/22/2018 0339   RBC 4.48 01/22/2018 0339   HGB 12.9 (L) 01/22/2018 0339   HCT 41.7  01/22/2018 0339   PLT 211 01/22/2018 0339   MCV 93.1 01/22/2018 0339   MCH 28.8 01/22/2018 0339   MCHC 30.9 01/22/2018 0339   RDW 14.0 01/22/2018 0339   LYMPHSABS 1.8 01/22/2018 0339   MONOABS 0.6 01/22/2018 0339   EOSABS 0.3 01/22/2018 0339   BASOSABS 0.1 01/22/2018 0339    No results found for: "POCLITH", "LITHIUM"   No results found for: "PHENYTOIN", "PHENOBARB", "VALPROATE", "CBMZ"   .res Assessment: Plan:    Cyris was seen today for follow-up, anxiety and stress.  Diagnoses and all orders for this visit:  Schizophrenia, paranoid (HCC)  Mixed obsessional thoughts and acts  Generalized anxiety disorder  Late onset Alzheimer's disease with behavioral disturbance (HCC)  Insomnia due to mental condition   30 min face to face time with patient was spent on counseling and coordination of care. We discussed several topics as noted:  Patient has been under my psychiatric years since 1998 .unlikely that med change will reduce the paranoia and auditory hallucinations which are chronic. Voices are better and paranoia appears a little better but varies.  Reports still cooperative with staff.  He has been on multiple psychiatric medications and has done best on this combination of Zyprexa which was increased to 25 mg April 09, 2018, , , and fluvoxamine 100 mg daily. Is cooperative genereally.   He also still has obsessive and intrusive fears about losing his salvation but they are chronic but less severe over time.  Given his age and the chronicity of the symptoms med changes are unlikely to help.  Chronic reassurance seeking.  Calls people for reassurance. Overall also paranoia is better but not severe.  Needs high dose olanzapine.    Supportive therapy dealing with chronic paranoia.  Encourage his spirituality as a coping mechanism.  Needs a lot of encouragement.  Repetitively asks for reassurance.  He is making fewer after hours phone calls to our office than historically has  been done.  Does not look like any in the last few months.    Weaker and needing more physical assistance.  WC bound  .  Looks more debilitated.    Discussed potential metabolic side effects associated with atypical antipsychotics, as well as potential risk for movement side effects. Advised pt to contact office if movement side effects occur.   Disc he's taking more than the usuall highest dose of olanzapine but is medically necessary for paranoia. No AIM.  successfully DC alprazolam  Continue olanzapine 25 mg HS for psychosis Continue  fluvoxamine 100 mg HS for OCD and anxiety Continue zolpidem 2.5 mg HS for sleep.  If he gets the olanzapine 2-3 hours before  bedtime he might be able to stop this too.  30 min appt  FU 6 mos  Meredith Staggers, MD, DFAPA .    Future Appointments  Date Time Provider Department Center  09/18/2022 10:45 AM Helane Gunther, DPM [facility]    No orders of the defined types were placed in this encounter.      -------------------------------

## 2022-07-20 DIAGNOSIS — I129 Hypertensive chronic kidney disease with stage 1 through stage 4 chronic kidney disease, or unspecified chronic kidney disease: Secondary | ICD-10-CM | POA: Diagnosis not present

## 2022-07-20 DIAGNOSIS — K219 Gastro-esophageal reflux disease without esophagitis: Secondary | ICD-10-CM | POA: Diagnosis not present

## 2022-07-20 DIAGNOSIS — K5909 Other constipation: Secondary | ICD-10-CM | POA: Diagnosis not present

## 2022-07-20 DIAGNOSIS — R600 Localized edema: Secondary | ICD-10-CM | POA: Diagnosis not present

## 2022-07-20 DIAGNOSIS — E1169 Type 2 diabetes mellitus with other specified complication: Secondary | ICD-10-CM | POA: Diagnosis not present

## 2022-07-22 DIAGNOSIS — E119 Type 2 diabetes mellitus without complications: Secondary | ICD-10-CM | POA: Diagnosis not present

## 2022-07-22 DIAGNOSIS — I129 Hypertensive chronic kidney disease with stage 1 through stage 4 chronic kidney disease, or unspecified chronic kidney disease: Secondary | ICD-10-CM | POA: Diagnosis not present

## 2022-07-22 DIAGNOSIS — K219 Gastro-esophageal reflux disease without esophagitis: Secondary | ICD-10-CM | POA: Diagnosis not present

## 2022-07-27 DIAGNOSIS — R600 Localized edema: Secondary | ICD-10-CM | POA: Diagnosis not present

## 2022-07-27 DIAGNOSIS — K5909 Other constipation: Secondary | ICD-10-CM | POA: Diagnosis not present

## 2022-07-27 DIAGNOSIS — K219 Gastro-esophageal reflux disease without esophagitis: Secondary | ICD-10-CM | POA: Diagnosis not present

## 2022-07-30 DIAGNOSIS — I129 Hypertensive chronic kidney disease with stage 1 through stage 4 chronic kidney disease, or unspecified chronic kidney disease: Secondary | ICD-10-CM | POA: Diagnosis not present

## 2022-07-30 DIAGNOSIS — E119 Type 2 diabetes mellitus without complications: Secondary | ICD-10-CM | POA: Diagnosis not present

## 2022-07-30 DIAGNOSIS — K219 Gastro-esophageal reflux disease without esophagitis: Secondary | ICD-10-CM | POA: Diagnosis not present

## 2022-08-02 DIAGNOSIS — E1122 Type 2 diabetes mellitus with diabetic chronic kidney disease: Secondary | ICD-10-CM | POA: Diagnosis not present

## 2022-08-12 DIAGNOSIS — E119 Type 2 diabetes mellitus without complications: Secondary | ICD-10-CM | POA: Diagnosis not present

## 2022-08-12 DIAGNOSIS — K219 Gastro-esophageal reflux disease without esophagitis: Secondary | ICD-10-CM | POA: Diagnosis not present

## 2022-08-12 DIAGNOSIS — I129 Hypertensive chronic kidney disease with stage 1 through stage 4 chronic kidney disease, or unspecified chronic kidney disease: Secondary | ICD-10-CM | POA: Diagnosis not present

## 2022-08-13 DIAGNOSIS — H04123 Dry eye syndrome of bilateral lacrimal glands: Secondary | ICD-10-CM | POA: Diagnosis not present

## 2022-08-13 DIAGNOSIS — H35033 Hypertensive retinopathy, bilateral: Secondary | ICD-10-CM | POA: Diagnosis not present

## 2022-08-13 DIAGNOSIS — H40013 Open angle with borderline findings, low risk, bilateral: Secondary | ICD-10-CM | POA: Diagnosis not present

## 2022-08-13 DIAGNOSIS — E113292 Type 2 diabetes mellitus with mild nonproliferative diabetic retinopathy without macular edema, left eye: Secondary | ICD-10-CM | POA: Diagnosis not present

## 2022-08-22 DIAGNOSIS — I129 Hypertensive chronic kidney disease with stage 1 through stage 4 chronic kidney disease, or unspecified chronic kidney disease: Secondary | ICD-10-CM | POA: Diagnosis not present

## 2022-08-22 DIAGNOSIS — E119 Type 2 diabetes mellitus without complications: Secondary | ICD-10-CM | POA: Diagnosis not present

## 2022-08-22 DIAGNOSIS — K219 Gastro-esophageal reflux disease without esophagitis: Secondary | ICD-10-CM | POA: Diagnosis not present

## 2022-09-01 DIAGNOSIS — K5909 Other constipation: Secondary | ICD-10-CM | POA: Diagnosis not present

## 2022-09-01 DIAGNOSIS — K219 Gastro-esophageal reflux disease without esophagitis: Secondary | ICD-10-CM | POA: Diagnosis not present

## 2022-09-03 DIAGNOSIS — K219 Gastro-esophageal reflux disease without esophagitis: Secondary | ICD-10-CM | POA: Diagnosis not present

## 2022-09-03 DIAGNOSIS — I129 Hypertensive chronic kidney disease with stage 1 through stage 4 chronic kidney disease, or unspecified chronic kidney disease: Secondary | ICD-10-CM | POA: Diagnosis not present

## 2022-09-03 DIAGNOSIS — E119 Type 2 diabetes mellitus without complications: Secondary | ICD-10-CM | POA: Diagnosis not present

## 2022-09-09 DIAGNOSIS — I129 Hypertensive chronic kidney disease with stage 1 through stage 4 chronic kidney disease, or unspecified chronic kidney disease: Secondary | ICD-10-CM | POA: Diagnosis not present

## 2022-09-09 DIAGNOSIS — K219 Gastro-esophageal reflux disease without esophagitis: Secondary | ICD-10-CM | POA: Diagnosis not present

## 2022-09-09 DIAGNOSIS — E119 Type 2 diabetes mellitus without complications: Secondary | ICD-10-CM | POA: Diagnosis not present

## 2022-09-18 ENCOUNTER — Ambulatory Visit: Payer: Medicaid Other | Admitting: Podiatry

## 2022-09-18 ENCOUNTER — Encounter: Payer: Self-pay | Admitting: Podiatry

## 2022-09-18 ENCOUNTER — Ambulatory Visit (INDEPENDENT_AMBULATORY_CARE_PROVIDER_SITE_OTHER): Payer: Medicare Other | Admitting: Podiatry

## 2022-09-18 DIAGNOSIS — B351 Tinea unguium: Secondary | ICD-10-CM | POA: Diagnosis not present

## 2022-09-18 DIAGNOSIS — M79676 Pain in unspecified toe(s): Secondary | ICD-10-CM | POA: Diagnosis not present

## 2022-09-18 DIAGNOSIS — E1159 Type 2 diabetes mellitus with other circulatory complications: Secondary | ICD-10-CM

## 2022-09-18 NOTE — Progress Notes (Signed)
This patient returns to my office for at risk foot care.  This patient requires this care by a professional since this patient will be at risk due to having CKD and diabetes.  This patient is unable to cut nails himself since the patient cannot reach his nails.These nails are painful walking and wearing shoes. This patient presents to the office in a wheelchair.  This patient presents for at risk foot care today.  General Appearance  Alert, conversant and in no acute stress.  Vascular  Dorsalis pedis and posterior tibial  pulses are weakly  palpable  bilaterally.  Capillary return is within normal limits  bilaterally. Cold feet  Bilaterally. Absent digital hair B/L.  Neurologic  Senn-Weinstein monofilament wire test diminished   bilaterally. Muscle power within normal limits bilaterally.  Nails Thick disfigured discolored nails with subungual debris  from hallux to fifth toes bilaterally. No evidence of bacterial infection or drainage bilaterally.  Orthopedic  No limitations of motion  feet .  No crepitus or effusions noted.  Contracted digits  B/L.  Skin  normotropic skin with no porokeratosis noted bilaterally.  No signs of infections or ulcers noted.     Onychomycosis  Pain in right toes  Pain in left toes  Consent was obtained for treatment procedures.   Mechanical debridement of nails 1-5  bilaterally performed with a nail nipper.    RTC 3 months.     Return office visit   3 months                  Told patient to return for periodic foot care and evaluation due to potential at risk complications.   Gregory Mayer DPM  

## 2022-09-19 DIAGNOSIS — I129 Hypertensive chronic kidney disease with stage 1 through stage 4 chronic kidney disease, or unspecified chronic kidney disease: Secondary | ICD-10-CM | POA: Diagnosis not present

## 2022-09-19 DIAGNOSIS — E119 Type 2 diabetes mellitus without complications: Secondary | ICD-10-CM | POA: Diagnosis not present

## 2022-09-19 DIAGNOSIS — K219 Gastro-esophageal reflux disease without esophagitis: Secondary | ICD-10-CM | POA: Diagnosis not present

## 2022-09-23 ENCOUNTER — Inpatient Hospital Stay (HOSPITAL_COMMUNITY)
Admission: EM | Admit: 2022-09-23 | Discharge: 2022-09-27 | DRG: 871 | Disposition: A | Discharge status: Skilled Nursing Facility | Payer: Medicare Other | Source: Skilled Nursing Facility | Attending: Internal Medicine | Admitting: Internal Medicine

## 2022-09-23 ENCOUNTER — Encounter (HOSPITAL_COMMUNITY): Payer: Self-pay | Admitting: *Deleted

## 2022-09-23 ENCOUNTER — Emergency Department (HOSPITAL_COMMUNITY): Payer: Medicare Other

## 2022-09-23 ENCOUNTER — Other Ambulatory Visit: Payer: Self-pay

## 2022-09-23 DIAGNOSIS — G3184 Mild cognitive impairment, so stated: Secondary | ICD-10-CM | POA: Diagnosis present

## 2022-09-23 DIAGNOSIS — E785 Hyperlipidemia, unspecified: Secondary | ICD-10-CM | POA: Diagnosis present

## 2022-09-23 DIAGNOSIS — R6520 Severe sepsis without septic shock: Secondary | ICD-10-CM | POA: Diagnosis present

## 2022-09-23 DIAGNOSIS — F209 Schizophrenia, unspecified: Secondary | ICD-10-CM | POA: Diagnosis present

## 2022-09-23 DIAGNOSIS — F0394 Unspecified dementia, unspecified severity, with anxiety: Secondary | ICD-10-CM | POA: Diagnosis present

## 2022-09-23 DIAGNOSIS — Z794 Long term (current) use of insulin: Secondary | ICD-10-CM

## 2022-09-23 DIAGNOSIS — J984 Other disorders of lung: Secondary | ICD-10-CM | POA: Diagnosis not present

## 2022-09-23 DIAGNOSIS — A419 Sepsis, unspecified organism: Secondary | ICD-10-CM | POA: Diagnosis present

## 2022-09-23 DIAGNOSIS — I110 Hypertensive heart disease with heart failure: Secondary | ICD-10-CM | POA: Diagnosis present

## 2022-09-23 DIAGNOSIS — N400 Enlarged prostate without lower urinary tract symptoms: Secondary | ICD-10-CM | POA: Diagnosis present

## 2022-09-23 DIAGNOSIS — K219 Gastro-esophageal reflux disease without esophagitis: Secondary | ICD-10-CM | POA: Diagnosis present

## 2022-09-23 DIAGNOSIS — E871 Hypo-osmolality and hyponatremia: Secondary | ICD-10-CM | POA: Diagnosis not present

## 2022-09-23 DIAGNOSIS — J9690 Respiratory failure, unspecified, unspecified whether with hypoxia or hypercapnia: Secondary | ICD-10-CM | POA: Diagnosis not present

## 2022-09-23 DIAGNOSIS — E1165 Type 2 diabetes mellitus with hyperglycemia: Secondary | ICD-10-CM | POA: Diagnosis not present

## 2022-09-23 DIAGNOSIS — K760 Fatty (change of) liver, not elsewhere classified: Secondary | ICD-10-CM | POA: Diagnosis present

## 2022-09-23 DIAGNOSIS — F0393 Unspecified dementia, unspecified severity, with mood disturbance: Secondary | ICD-10-CM | POA: Diagnosis present

## 2022-09-23 DIAGNOSIS — Z1152 Encounter for screening for COVID-19: Secondary | ICD-10-CM

## 2022-09-23 DIAGNOSIS — F03918 Unspecified dementia, unspecified severity, with other behavioral disturbance: Secondary | ICD-10-CM | POA: Diagnosis present

## 2022-09-23 DIAGNOSIS — F32A Depression, unspecified: Secondary | ICD-10-CM | POA: Diagnosis present

## 2022-09-23 DIAGNOSIS — I5032 Chronic diastolic (congestive) heart failure: Secondary | ICD-10-CM | POA: Diagnosis present

## 2022-09-23 DIAGNOSIS — J9621 Acute and chronic respiratory failure with hypoxia: Secondary | ICD-10-CM | POA: Diagnosis present

## 2022-09-23 DIAGNOSIS — Z7984 Long term (current) use of oral hypoglycemic drugs: Secondary | ICD-10-CM | POA: Diagnosis not present

## 2022-09-23 DIAGNOSIS — I499 Cardiac arrhythmia, unspecified: Secondary | ICD-10-CM | POA: Diagnosis not present

## 2022-09-23 DIAGNOSIS — Z743 Need for continuous supervision: Secondary | ICD-10-CM | POA: Diagnosis not present

## 2022-09-23 DIAGNOSIS — Z23 Encounter for immunization: Secondary | ICD-10-CM | POA: Diagnosis not present

## 2022-09-23 DIAGNOSIS — N390 Urinary tract infection, site not specified: Secondary | ICD-10-CM | POA: Diagnosis present

## 2022-09-23 DIAGNOSIS — Z79899 Other long term (current) drug therapy: Secondary | ICD-10-CM

## 2022-09-23 DIAGNOSIS — I10 Essential (primary) hypertension: Secondary | ICD-10-CM | POA: Diagnosis not present

## 2022-09-23 DIAGNOSIS — E119 Type 2 diabetes mellitus without complications: Secondary | ICD-10-CM

## 2022-09-23 DIAGNOSIS — J90 Pleural effusion, not elsewhere classified: Secondary | ICD-10-CM | POA: Diagnosis not present

## 2022-09-23 DIAGNOSIS — J189 Pneumonia, unspecified organism: Secondary | ICD-10-CM | POA: Diagnosis present

## 2022-09-23 DIAGNOSIS — I4891 Unspecified atrial fibrillation: Secondary | ICD-10-CM | POA: Diagnosis present

## 2022-09-23 DIAGNOSIS — Z66 Do not resuscitate: Secondary | ICD-10-CM | POA: Diagnosis present

## 2022-09-23 DIAGNOSIS — R918 Other nonspecific abnormal finding of lung field: Secondary | ICD-10-CM | POA: Diagnosis not present

## 2022-09-23 DIAGNOSIS — J9601 Acute respiratory failure with hypoxia: Secondary | ICD-10-CM | POA: Diagnosis not present

## 2022-09-23 DIAGNOSIS — R0989 Other specified symptoms and signs involving the circulatory and respiratory systems: Secondary | ICD-10-CM | POA: Diagnosis not present

## 2022-09-23 DIAGNOSIS — R0689 Other abnormalities of breathing: Secondary | ICD-10-CM | POA: Diagnosis not present

## 2022-09-23 DIAGNOSIS — G4700 Insomnia, unspecified: Secondary | ICD-10-CM | POA: Diagnosis present

## 2022-09-23 DIAGNOSIS — R404 Transient alteration of awareness: Secondary | ICD-10-CM | POA: Diagnosis not present

## 2022-09-23 DIAGNOSIS — R6889 Other general symptoms and signs: Secondary | ICD-10-CM | POA: Diagnosis not present

## 2022-09-23 DIAGNOSIS — R0602 Shortness of breath: Secondary | ICD-10-CM | POA: Diagnosis not present

## 2022-09-23 DIAGNOSIS — R739 Hyperglycemia, unspecified: Secondary | ICD-10-CM | POA: Diagnosis not present

## 2022-09-23 DIAGNOSIS — I517 Cardiomegaly: Secondary | ICD-10-CM | POA: Diagnosis not present

## 2022-09-23 LAB — COMPREHENSIVE METABOLIC PANEL
ALT: 19 U/L (ref 0–44)
AST: 18 U/L (ref 15–41)
Albumin: 2.9 g/dL — ABNORMAL LOW (ref 3.5–5.0)
Alkaline Phosphatase: 93 U/L (ref 38–126)
Anion gap: 10 (ref 5–15)
BUN: 21 mg/dL (ref 8–23)
CO2: 26 mmol/L (ref 22–32)
Calcium: 8.5 mg/dL — ABNORMAL LOW (ref 8.9–10.3)
Chloride: 97 mmol/L — ABNORMAL LOW (ref 98–111)
Creatinine, Ser: 1.08 mg/dL (ref 0.61–1.24)
GFR, Estimated: >60 mL/min (ref >60)
Glucose, Bld: 271 mg/dL — ABNORMAL HIGH (ref 70–99)
Potassium: 3.9 mmol/L (ref 3.5–5.1)
Sodium: 133 mmol/L — ABNORMAL LOW (ref 135–145)
Total Bilirubin: 0.6 mg/dL (ref 0.3–1.2)
Total Protein: 7.3 g/dL (ref 6.5–8.1)

## 2022-09-23 LAB — CBC WITH DIFFERENTIAL/PLATELET
Abs Immature Granulocytes: 0.08 10*3/uL — ABNORMAL HIGH (ref 0.00–0.07)
Basophils Absolute: 0 10*3/uL (ref 0.0–0.1)
Basophils Relative: 0 %
Eosinophils Absolute: 0 10*3/uL (ref 0.0–0.5)
Eosinophils Relative: 0 %
HCT: 37.4 % — ABNORMAL LOW (ref 39.0–52.0)
Hemoglobin: 11.8 g/dL — ABNORMAL LOW (ref 13.0–17.0)
Immature Granulocytes: 1 %
Lymphocytes Relative: 4 %
Lymphs Abs: 0.7 10*3/uL (ref 0.7–4.0)
MCH: 29.6 pg (ref 26.0–34.0)
MCHC: 31.6 g/dL (ref 30.0–36.0)
MCV: 93.7 fL (ref 80.0–100.0)
Monocytes Absolute: 0.8 10*3/uL (ref 0.1–1.0)
Monocytes Relative: 5 %
Neutro Abs: 15.5 10*3/uL — ABNORMAL HIGH (ref 1.7–7.7)
Neutrophils Relative %: 90 %
Platelets: 269 10*3/uL (ref 150–400)
RBC: 3.99 MIL/uL — ABNORMAL LOW (ref 4.22–5.81)
RDW: 14.7 % (ref 11.5–15.5)
WBC: 17.1 10*3/uL — ABNORMAL HIGH (ref 4.0–10.5)
nRBC: 0 % (ref 0.0–0.2)

## 2022-09-23 LAB — GLUCOSE, CAPILLARY: Glucose-Capillary: 210 mg/dL — ABNORMAL HIGH (ref 70–99)

## 2022-09-23 LAB — URINALYSIS, W/ REFLEX TO CULTURE (INFECTION SUSPECTED)
Bilirubin Urine: NEGATIVE
Glucose, UA: >=500 mg/dL — AB
Ketones, ur: NEGATIVE mg/dL
Nitrite: NEGATIVE
Protein, ur: 30 mg/dL — AB
Specific Gravity, Urine: 1.02 (ref 1.005–1.030)
WBC, UA: >50 WBC/hpf (ref 0–5)
pH: 8 (ref 5.0–8.0)

## 2022-09-23 LAB — PROTIME-INR
INR: 1.1 (ref 0.8–1.2)
Prothrombin Time: 14.5 s (ref 11.4–15.2)

## 2022-09-23 LAB — RESP PANEL BY RT-PCR (RSV, FLU A&B, COVID)  RVPGX2
Influenza A by PCR: NEGATIVE
Influenza B by PCR: NEGATIVE
Resp Syncytial Virus by PCR: NEGATIVE
SARS Coronavirus 2 by RT PCR: NEGATIVE

## 2022-09-23 LAB — APTT: aPTT: 30 s (ref 24–36)

## 2022-09-23 LAB — I-STAT CG4 LACTIC ACID, ED
Lactic Acid, Venous: 2.1 mmol/L (ref 0.5–1.9)
Lactic Acid, Venous: 1.2 mmol/L (ref 0.5–1.9)

## 2022-09-23 MED ORDER — LACTATED RINGERS IV SOLN: INTRAVENOUS | Status: DC

## 2022-09-23 MED ORDER — TAMSULOSIN HCL 0.4 MG PO CAPS
0.4000 mg | ORAL_CAPSULE | Freq: Every day | ORAL | Status: DC
  Administered 2022-09-24 – 2022-09-27 (×4): 0.4 mg via ORAL
  Filled 2022-09-23 (×4): qty 1

## 2022-09-23 MED ORDER — INSULIN GLARGINE-YFGN 100 UNIT/ML ~~LOC~~ SOLN
10.0000 [IU] | Freq: Every day | SUBCUTANEOUS | Status: DC
  Administered 2022-09-24 – 2022-09-26 (×4): 10 [IU] via SUBCUTANEOUS
  Filled 2022-09-23 (×5): qty 0.1

## 2022-09-23 MED ORDER — MIRABEGRON ER 25 MG PO TB24: 25.0000 mg | ORAL_TABLET | Freq: Every day | ORAL | Status: DC

## 2022-09-23 MED ORDER — INSULIN ASPART 100 UNIT/ML IJ SOLN
0.0000 [IU] | Freq: Every day | INTRAMUSCULAR | Status: DC
  Administered 2022-09-24: 3 [IU] via SUBCUTANEOUS
  Administered 2022-09-24 – 2022-09-26 (×2): 2 [IU] via SUBCUTANEOUS
  Filled 2022-09-23: qty 0.05

## 2022-09-23 MED ORDER — SODIUM CHLORIDE 0.9 % IV SOLN
2.0000 g | INTRAVENOUS | Status: DC
  Administered 2022-09-23 – 2022-09-25 (×3): 2 g via INTRAVENOUS
  Filled 2022-09-23 (×4): qty 20

## 2022-09-23 MED ORDER — ONDANSETRON HCL 4 MG PO TABS: 4.0000 mg | ORAL_TABLET | Freq: Four times a day (QID) | ORAL | Status: DC | PRN

## 2022-09-23 MED ORDER — PANTOPRAZOLE SODIUM 40 MG PO TBEC
40.0000 mg | DELAYED_RELEASE_TABLET | Freq: Every day | ORAL | Status: DC
  Administered 2022-09-24 – 2022-09-27 (×4): 40 mg via ORAL
  Filled 2022-09-23 (×4): qty 1

## 2022-09-23 MED ORDER — SODIUM CHLORIDE 0.9 % IV SOLN
500.0000 mg | INTRAVENOUS | Status: DC
  Administered 2022-09-23 – 2022-09-25 (×3): 500 mg via INTRAVENOUS
  Filled 2022-09-23 (×5): qty 5

## 2022-09-23 MED ORDER — ZOLPIDEM TARTRATE 5 MG PO TABS
5.0000 mg | ORAL_TABLET | Freq: Every day | ORAL | Status: DC
  Administered 2022-09-24 – 2022-09-26 (×4): 5 mg via ORAL
  Filled 2022-09-23 (×4): qty 1

## 2022-09-23 MED ORDER — SENNOSIDES-DOCUSATE SODIUM 8.6-50 MG PO TABS: 1.0000 | ORAL_TABLET | Freq: Every evening | ORAL | Status: DC | PRN

## 2022-09-23 MED ORDER — ACETAMINOPHEN 325 MG PO TABS: 650.0000 mg | ORAL_TABLET | Freq: Four times a day (QID) | ORAL | Status: DC | PRN

## 2022-09-23 MED ORDER — INSULIN ASPART 100 UNIT/ML IJ SOLN
0.0000 [IU] | Freq: Three times a day (TID) | INTRAMUSCULAR | Status: DC
  Administered 2022-09-24 – 2022-09-25 (×2): 2 [IU] via SUBCUTANEOUS
  Administered 2022-09-26: 3 [IU] via SUBCUTANEOUS
  Administered 2022-09-26: 1 [IU] via SUBCUTANEOUS
  Administered 2022-09-27: 2 [IU] via SUBCUTANEOUS
  Filled 2022-09-23: qty 0.06

## 2022-09-23 MED ORDER — FLUVOXAMINE MALEATE 50 MG PO TABS
100.0000 mg | ORAL_TABLET | Freq: Every day | ORAL | Status: DC
  Administered 2022-09-24 – 2022-09-26 (×4): 100 mg via ORAL
  Filled 2022-09-23 (×4): qty 2

## 2022-09-23 MED ORDER — ACETAMINOPHEN 650 MG RE SUPP: 650.0000 mg | Freq: Four times a day (QID) | RECTAL | Status: DC | PRN

## 2022-09-23 MED ORDER — SODIUM CHLORIDE 0.9% FLUSH
3.0000 mL | Freq: Two times a day (BID) | INTRAVENOUS | Status: DC
  Administered 2022-09-24 – 2022-09-27 (×7): 3 mL via INTRAVENOUS

## 2022-09-23 MED ORDER — ONDANSETRON HCL 4 MG/2ML IJ SOLN: 4.0000 mg | Freq: Four times a day (QID) | INTRAMUSCULAR | Status: DC | PRN

## 2022-09-23 MED ORDER — OLANZAPINE 5 MG PO TABS
25.0000 mg | ORAL_TABLET | Freq: Every day | ORAL | Status: DC
  Administered 2022-09-24 – 2022-09-27 (×5): 25 mg via ORAL
  Filled 2022-09-23 (×6): qty 5

## 2022-09-23 MED ORDER — GUAIFENESIN 100 MG/5ML PO LIQD
5.0000 mL | ORAL | Status: DC | PRN
  Administered 2022-09-25 – 2022-09-26 (×2): 5 mL via ORAL
  Filled 2022-09-23 (×2): qty 10

## 2022-09-23 MED ORDER — PRAVASTATIN SODIUM 40 MG PO TABS
40.0000 mg | ORAL_TABLET | Freq: Every day | ORAL | Status: DC
  Administered 2022-09-24 – 2022-09-27 (×4): 40 mg via ORAL
  Filled 2022-09-23 (×4): qty 1

## 2022-09-23 MED ORDER — ENOXAPARIN SODIUM 40 MG/0.4ML IJ SOSY
40.0000 mg | PREFILLED_SYRINGE | INTRAMUSCULAR | Status: DC
  Administered 2022-09-24 – 2022-09-26 (×4): 40 mg via SUBCUTANEOUS
  Filled 2022-09-23 (×4): qty 0.4

## 2022-09-23 NOTE — ED Notes (Signed)
ED TO INPATIENT HANDOFF REPORT  ED Nurse Name and Phone #:   S Name/Age/Gender Ricky [name]. 82 y.o. male Room/Bed: WA18/WA18  Code Status   Code Status: Limited: Do not attempt resuscitation (DNR) -DNR-LIMITED -Do Not Intubate/DNI   Home/SNF/Other Nursing Home Patient oriented to: self, place, time, and situation Is this baseline? Yes   Triage Complete: Triage complete  Chief Complaint Sepsis due to pneumonia (HCC) [J18.9, A41.9]  Triage Note BIB [facility] from [facility] for concern for UTI and SOB. Initial SPO2 on 4L was 84-86%. Does not wear home O2. New hypoxia. Also strong urine odor. Denies pain. Initial BP was 96/50, improved to 112/68 after 500cc NS. NSL 18g, L AC. A&Ox3 with some mild confusion. H/o DM. BS 331. No fever noted, but triggered sepsis protocol. LS rhonchi bilaterally. Pt thirsty and hungry. Remains on NRB.   Allergies Allergies  Allergen Reactions   Asa [Aspirin]     "Dr told me not to take it"   Codeine Other (See Comments)    DIZZINESS with Tylenol 3   Tylenol With Codeine #3 [Acetaminophen-Codeine]     Other reaction(s): Unknown   Tylenol [Acetaminophen] Other (See Comments)    Dizziness with tylenol 3    Level of Care/Admitting Diagnosis ED Disposition     ED Disposition  Admit   Condition  --   Comment  Hospital Area: [facility] [[id number]]  Level of Care: Progressive [102]  Admit to Progressive based on following criteria: MULTISYSTEM THREATS such as stable sepsis, metabolic/electrolyte imbalance with or without encephalopathy that is responding to early treatment.  May admit patient to [facility] or [facility] if equivalent level of care is available:: Yes  Covid Evaluation: Confirmed COVID Negative  Diagnosis: Sepsis due to pneumonia [facility]) [[id number]]  Admitting Physician: Briscoe Deutscher [[id number]]  Attending Physician: Briscoe Deutscher [[id number]]  Certification:: I certify this patient will need inpatient  services for at least 2 midnights  Expected Medical Readiness: 09/26/2022          B Medical/Surgery History Past Medical History:  Diagnosis Date   Abnormality of gait    Adenomatous colon polyp    Arthritis    Cataract    Dementia (HCC)    Depression    Diabetes mellitus without complication (HCC)    Fatty liver    Hyperlipidemia    Hypertension    Internal hemorrhoids    Other malaise and fatigue    Peripheral edema    Schizophrenia (HCC)    Type II or unspecified type diabetes mellitus without mention of complication, not stated as uncontrolled    Urinary frequency    Urinary retention    Past Surgical History:  Procedure Laterality Date   BALLOON DILATION N/A 05/22/2012   Procedure: BALLOON DILATION;  Surgeon: Theda Belfast, MD;  Location: [facility];  Service: Endoscopy;  Laterality: N/A;   BALLOON DILATION N/A 06/09/2015   Procedure: BALLOON DILATION;  Surgeon: Jeani Hawking, MD;  Location: [facility];  Service: Endoscopy;  Laterality: N/A;   ESOPHAGOGASTRODUODENOSCOPY  02/21/2012   Procedure: ESOPHAGOGASTRODUODENOSCOPY (EGD);  Surgeon: Theda Belfast, MD;  Location: [facility];  Service: Endoscopy;  Laterality: N/A;   ESOPHAGOGASTRODUODENOSCOPY N/A 05/22/2012   Procedure: ESOPHAGOGASTRODUODENOSCOPY (EGD);  Surgeon: Theda Belfast, MD;  Location: [facility];  Service: Endoscopy;  Laterality: N/A;   ESOPHAGOGASTRODUODENOSCOPY N/A 06/17/2014   Procedure: ESOPHAGOGASTRODUODENOSCOPY (EGD);  Surgeon: Jeani Hawking, MD;  Location: [facility];  Service: Endoscopy;  Laterality: N/A;  ESOPHAGOGASTRODUODENOSCOPY N/A 08/10/2014   Procedure: ESOPHAGOGASTRODUODENOSCOPY (EGD);  Surgeon: Jeani Hawking, MD;  Location: [facility];  Service: Endoscopy;  Laterality: N/A;   ESOPHAGOGASTRODUODENOSCOPY N/A 05/28/2015   Procedure: ESOPHAGOGASTRODUODENOSCOPY (EGD);  Surgeon: Iva Boop, MD;  Location: [facility];  Service: Endoscopy;  Laterality: N/A;   ESOPHAGOGASTRODUODENOSCOPY  (EGD) WITH PROPOFOL N/A 06/09/2015   Procedure: ESOPHAGOGASTRODUODENOSCOPY (EGD) WITH PROPOFOL;  Surgeon: Jeani Hawking, MD;  Location: [facility];  Service: Endoscopy;  Laterality: N/A;   SAVORY DILATION N/A 06/17/2014   Procedure: SAVORY DILATION;  Surgeon: Jeani Hawking, MD;  Location: [facility];  Service: Endoscopy;  Laterality: N/A;   TEE WITHOUT CARDIOVERSION N/A 01/20/2018   Procedure: TRANSESOPHAGEAL ECHOCARDIOGRAM (TEE);  Surgeon: Jake Bathe, MD;  Location[facility] END[facility]COPY;  Service: Cardiovascular;  Laterality: N/A;   TONSILLECTOMY       A IV Location/Drains/Wounds Patient Lines/Drains/Airways Status     Active Line/Drains/Airways     Name Placement date Placement time Site Days   Peripheral IV 09/23/22 18 G 1" Left Forearm 09/23/22  1711  Forearm  less than 1   Peripheral IV 09/23/22 20 G 1" Left;Lateral;Proximal Forearm 09/23/22  1735  Forearm  less than 1   PICC Single Lumen 01/21/18 PICC Right Brachial 42 cm 0 cm 01/21/18  1152  Brachial  1706            Intake/Output Last 24 hours  Intake/Output Summary (Last 24 hours) at 09/23/2022 2217 Last data filed at 09/23/2022 1939 Gross per 24 hour  Intake 850 ml  Output --  Net 850 ml    Labs/Imaging Results for orders placed or performed during the hospital encounter of 09/23/22 (from the past 48 hour(s))  Comprehensive metabolic panel     Status: Abnormal   Collection Time: 09/23/22  5:30 PM  Result Value Ref Range   Sodium 133 (L) 135 - 145 mmol/L   Potassium 3.9 3.5 - 5.1 mmol/L   Chloride 97 (L) 98 - 111 mmol/L   CO2 26 22 - 32 mmol/L   Glucose, Bld 271 (H) 70 - 99 mg/dL    Comment: Glucose reference range applies only to samples taken after fasting for at least 8 hours.   BUN 21 8 - 23 mg/dL   Creatinine, Ser 1.61 0.61 - 1.24 mg/dL   Calcium 8.5 (L) 8.9 - 10.3 mg/dL   Total Protein 7.3 6.5 - 8.1 g/dL   Albumin 2.9 (L) 3.5 - 5.0 g/dL   AST 18 15 - 41 U/L   ALT 19 0 - 44 U/L   Alkaline Phosphatase 93 38  - 126 U/L   Total Bilirubin 0.6 0.3 - 1.2 mg/dL   GFR, Estimated >09 >60 mL/min    Comment: (NOTE) Calculated using the CKD-EPI Creatinine Equation (2021)    Anion gap 10 5 - 15    Comment: Performed at [facility], [address] [place], [place] [address]  CBC with Differential     Status: Abnormal   Collection Time: 09/23/22  5:30 PM  Result Value Ref Range   WBC 17.1 (H) 4.0 - 10.5 K/uL   RBC 3.99 (L) 4.22 - 5.81 MIL/uL   Hemoglobin 11.8 (L) 13.0 - 17.0 g/dL   HCT 81.1 (L) 91.4 - 78.2 %   MCV 93.7 80.0 - 100.0 fL   MCH 29.6 26.0 - 34.0 pg   MCHC 31.6 30.0 - 36.0 g/dL   RDW 95.6 21.3 - 08.6 %   Platelets 269 150 - 400 K/uL  nRBC 0.0 0.0 - 0.2 %   Neutrophils Relative % 90 %   Neutro Abs 15.5 (H) 1.7 - 7.7 K/uL   Lymphocytes Relative 4 %   Lymphs Abs 0.7 0.7 - 4.0 K/uL   Monocytes Relative 5 %   Monocytes Absolute 0.8 0.1 - 1.0 K/uL   Eosinophils Relative 0 %   Eosinophils Absolute 0.0 0.0 - 0.5 K/uL   Basophils Relative 0 %   Basophils Absolute 0.0 0.0 - 0.1 K/uL   Immature Granulocytes 1 %   Abs Immature Granulocytes 0.08 (H) 0.00 - 0.07 K/uL    Comment: Performed at [facility], [address], [place], [place] [address]  Protime-INR     Status: None   Collection Time: 09/23/22  5:30 PM  Result Value Ref Range   Prothrombin Time 14.5 11.4 - 15.2 seconds   INR 1.1 0.8 - 1.2    Comment: (NOTE) INR goal varies based on device and disease states. Performed at [facility], [address] [place], [place] [address]   APTT     Status: None   Collection Time: 09/23/22  5:30 PM  Result Value Ref Range   aPTT 30 24 - 36 seconds    Comment: Performed at [facility], [address], [place], [place] [address]  Blood Culture (routine x 2)     Status: None (Preliminary result)   Collection Time: 09/23/22  5:30 PM   Specimen: BLOOD LEFT FOREARM  Result Value Ref Range   Specimen Description       BLOOD LEFT FOREARM Performed at [facility], [address], [place], [place] [address]    Special Requests      BOTTLES DRAWN AEROBIC AND ANAEROBIC Blood Culture results may not be optimal due to an excessive volume of blood received in culture bottles Performed at [facility], [address], [place], [place] [address]    Culture PENDING    Report Status PENDING   Blood Culture (routine x 2)     Status: None (Preliminary result)   Collection Time: 09/23/22  5:35 PM   Specimen: BLOOD LEFT ARM  Result Value Ref Range   Specimen Description      BLOOD LEFT ARM Performed at [facility], [address], [place], [place] [address]    Special Requests      BOTTLES DRAWN AEROBIC AND ANAEROBIC Blood Culture adequate volume Performed at [facility], [address] [place], [place] [address]    Culture PENDING    Report Status PENDING   I-Stat Lactic Acid, ED     Status: Abnormal   Collection Time: 09/23/22  5:43 PM  Result Value Ref Range   Lactic Acid, Venous 2.1 (HH) 0.5 - 1.9 mmol/L   Comment NOTIFIED PHYSICIAN   Resp panel by RT-PCR (RSV, Flu A&B, Covid) Anterior Nasal Swab     Status: None   Collection Time: 09/23/22  5:49 PM   Specimen: Anterior Nasal Swab  Result Value Ref Range   SARS Coronavirus 2 by RT PCR NEGATIVE NEGATIVE    Comment: (NOTE) SARS-CoV-2 target nucleic acids are NOT DETECTED.  The SARS-CoV-2 RNA is generally detectable in upper respiratory specimens during the acute phase of infection. The lowest concentration of SARS-CoV-2 viral copies this assay can detect is 138 copies/mL. A negative result does not preclude SARS-Cov-2 infection and should not be used as the sole basis for treatment or other patient  management decisions. A negative result may occur with  improper specimen collection/handling, submission of specimen other than nasopharyngeal swab, presence of viral mutation(s) within the areas  targeted by this assay, and inadequate number of viral copies(<138 copies/mL). A negative result must be combined with clinical observations, patient history, and epidemiological information. The expected result is Negative.  Fact Sheet for Patients:  BloggerCourse.com  Fact Sheet for Healthcare Providers:  SeriousBroker.it  This test is no t yet approved or cleared by the [place] FDA and  has been authorized for detection and/or diagnosis of SARS-CoV-2 by FDA under an Emergency Use Authorization (EUA). This EUA will remain  in effect (meaning this test can be used) for the duration of the COVID-19 declaration under Section 564(b)(1) of the Act, 21 U.S.C.section 360bbb-3(b)(1), unless the authorization is terminated  or revoked sooner.       Influenza A by PCR NEGATIVE NEGATIVE   Influenza B by PCR NEGATIVE NEGATIVE    Comment: (NOTE) The Xpert Xpress SARS-CoV-2/FLU/RSV plus assay is intended as an aid in the diagnosis of influenza from Nasopharyngeal swab specimens and should not be used as a sole basis for treatment. Nasal washings and aspirates are unacceptable for Xpert Xpress SARS-CoV-2/FLU/RSV testing.  Fact Sheet for Patients: BloggerCourse.com  Fact Sheet for Healthcare Providers: SeriousBroker.it  This test is not yet approved or cleared by the [place] FDA and has been authorized for detection and/or diagnosis of SARS-CoV-2 by FDA under an Emergency Use Authorization (EUA). This EUA will remain in effect (meaning this test can be used) for the duration of the COVID-19 declaration under Section 564(b)(1) of the Act, 21 U.S.C. section 360bbb-3(b)(1), unless the authorization is terminated or revoked.     Resp Syncytial Virus by PCR NEGATIVE NEGATIVE    Comment: (NOTE) Fact Sheet for Patients: BloggerCourse.com  Fact Sheet for  Healthcare Providers: SeriousBroker.it  This test is not yet approved or cleared by the [place] FDA and has been authorized for detection and/or diagnosis of SARS-CoV-2 by FDA under an Emergency Use Authorization (EUA). This EUA will remain in effect (meaning this test can be used) for the duration of the COVID-19 declaration under Section 564(b)(1) of the Act, 21 U.S.C. section 360bbb-3(b)(1), unless the authorization is terminated or revoked.  Performed at [facility], [address] [place], [place] [address]   I-Stat Lactic Acid, ED     Status: None   Collection Time: 09/23/22  7:42 PM  Result Value Ref Range   Lactic Acid, Venous 1.2 0.5 - 1.9 mmol/L  Urinalysis, w/ Reflex to Culture (Infection Suspected) -Urine, Catheterized     Status: Abnormal   Collection Time: 09/23/22  8:40 PM  Result Value Ref Range   Specimen Source URINE, CATHETERIZED    Color, Urine YELLOW YELLOW   APPearance HAZY (A) CLEAR   Specific Gravity, Urine 1.020 1.005 - 1.030   pH 8.0 5.0 - 8.0   Glucose, UA >=500 (A) NEGATIVE mg/dL   Hgb urine dipstick SMALL (A) NEGATIVE   Bilirubin Urine NEGATIVE NEGATIVE   Ketones, ur NEGATIVE NEGATIVE mg/dL   Protein, ur 30 (A) NEGATIVE mg/dL   Nitrite NEGATIVE NEGATIVE   Leukocytes,Ua LARGE (A) NEGATIVE   RBC / HPF 0-5 0 - 5 RBC/hpf   WBC, UA >50 0 - 5 WBC/hpf    Comment:        Reflex urine culture not performed if WBC <=10, OR if Squamous epithelial cells >5. If Squamous epithelial cells >5 suggest  recollection.    Bacteria, UA MANY (A) NONE SEEN   Squamous Epithelial / HPF 0-5 0 - 5 /HPF   WBC Clumps PRESENT     Comment: Performed at [facility], [address], [place], [place] [address]   DG Chest Port 1 View  Result Date: 09/23/2022 CLINICAL DATA:  Questionable sepsis. EXAM: PORTABLE CHEST 1 VIEW COMPARISON:  01/16/2018 FINDINGS: Mildly degraded exam due to AP portable technique  and patient body habitus. Reverse apical lordotic positioning. Mild cardiomegaly. Mild pulmonary venous congestion. Small left pleural effusion. Worsened left and developing medial right lung base airspace disease. IMPRESSION: Cardiomegaly with pulmonary venous congestion. Persistent small left pleural effusion with increased left-greater-than-right base airspace disease. Although this could be atelectasis, especially at the left lung base, given history of sepsis, pneumonia is a concern. Electronically Signed   By: Jeronimo Greaves M.D.   On: 09/23/2022 18:42    Pending Labs Unresulted Labs (From admission, onward)     Start     Ordered   09/30/22 0500  Creatinine, serum  (enoxaparin (LOVENOX)    CrCl >/= 30 ml/min)  Weekly,   R     Comments: while on enoxaparin therapy    09/23/22 2156   09/24/22 0500  Hemoglobin A1c  Tomorrow morning,   R       Comments: To assess prior glycemic control    09/23/22 2156   09/24/22 0500  Basic metabolic panel  Daily,   R      09/23/22 2156   09/24/22 0500  CBC  Daily,   R      09/23/22 2156   09/24/22 0500  Procalcitonin  Daily,   R     References:    Procalcitonin Lower Respiratory Tract Infection AND Sepsis Procalcitonin Algorithm   09/23/22 2156   09/23/22 2156  Legionella Pneumophila Serogp 1 Ur Ag  (COPD / Pneumonia / Cellulitis / Lower Extremity Wound)  Once,   R        09/23/22 2156   09/23/22 2156  Strep pneumoniae urinary antigen  (COPD / Pneumonia / Cellulitis / Lower Extremity Wound)  Once,   R        09/23/22 2156   09/23/22 2040  Urine Culture  Once,   R        09/23/22 2040            Vitals/Pain Today's Vitals   09/23/22 2115 09/23/22 2130 09/23/22 2154 09/23/22 2202  BP: (!) 101/56 102/64    Pulse: (!) 102 94 93   Resp: (!) 23 (!) 23 (!) 21   Temp:    99.5 F (37.5 C)  TempSrc:    Oral  SpO2: (!) 88% (!) 89% 91%   Weight:      PainSc:        Isolation Precautions No active isolations  Medications Medications   cefTRIAXone (ROCEPHIN) 2 g in sodium chloride 0.9 % 100 mL IVPB (0 g Intravenous Stopped 09/23/22 1913)  azithromycin (ZITHROMAX) 500 mg in sodium chloride 0.9 % 250 mL IVPB (0 mg Intravenous Stopped 09/23/22 1939)  pravastatin (PRAVACHOL) tablet 40 mg (has no administration in time range)  fluvoxaMINE (LUVOX) tablet 100 mg (has no administration in time range)  OLANZapine (ZYPREXA) tablet 25 mg (has no administration in time range)  zolpidem (AMBIEN) tablet 5 mg (has no administration in time range)  pantoprazole (PROTONIX) EC tablet 40 mg (has no administration in time range)  mirabegron ER (MYRBETRIQ) tablet 25  mg (has no administration in time range)  tamsulosin (FLOMAX) capsule 0.4 mg (has no administration in time range)  insulin glargine-yfgn (SEMGLEE) injection 10 Units (has no administration in time range)  insulin aspart (novoLOG) injection 0-6 Units (has no administration in time range)  insulin aspart (novoLOG) injection 0-5 Units (has no administration in time range)  enoxaparin (LOVENOX) injection 40 mg (has no administration in time range)  sodium chloride flush (NS) 0.9 % injection 3 mL (has no administration in time range)  acetaminophen (TYLENOL) tablet 650 mg (has no administration in time range)    Or  acetaminophen (TYLENOL) suppository 650 mg (has no administration in time range)  senna-docusate (Senokot-S) tablet 1 tablet (has no administration in time range)  ondansetron (ZOFRAN) tablet 4 mg (has no administration in time range)    Or  ondansetron (ZOFRAN) injection 4 mg (has no administration in time range)  guaiFENesin (ROBITUSSIN) 100 MG/5ML liquid 5 mL (has no administration in time range)    Mobility      Focused Assessments    R Recommendations: See Admitting Provider Note  Report given to:   Additional Notes:

## 2022-09-23 NOTE — ED Triage Notes (Signed)
BIB [facility] from [facility] for concern for UTI and SOB. Initial SPO2 on 4L was 84-86%. Does not wear home O2. New hypoxia. Also strong urine odor. Denies pain. Initial BP was 96/50, improved to 112/68 after 500cc NS. NSL 18g, L AC. A&Ox3 with some mild confusion. H/o DM. BS 331. No fever noted, but triggered sepsis protocol. LS rhonchi bilaterally. Pt thirsty and hungry. Remains on NRB.

## 2022-09-23 NOTE — ED Notes (Signed)
Nursing home RN updated on plan of care

## 2022-09-23 NOTE — H&P (Signed)
History and Physical    Ricky [name]. MVH:[id number] DOB: March 21, 1940 DOA: 09/23/2022  PCP: Renford Dills, MD   Patient coming from: SNF   Chief Complaint: Labored breathing, low O2 saturation   HPI: Ricky Szeto. is a 82 y.o. male with medical history significant for dementia, schizophrenia, insulin-dependent diabetes mellitus, hypertension, and chronic diastolic CHF who presents to the ED with labored respirations and hypoxia.  Patient appeared to have labored respirations at his facility today and was then noted to have oxygen saturation in the 80s on room air.  He was started on supplemental oxygen and brought into the ED.  He denies feeling short of breath, denies chest pain, and denies abdominal pain, vomiting, diarrhea, or dysuria.  ED Course: Upon arrival to the ED, patient is found to be febrile to 38.3 C and saturating mid 80s on room air with tachypnea, tachycardia, and systolic blood pressure of 97.  Chest x-ray is notable for cardiomegaly, pulmonary vascular congestion, small left pleural effusion, and increased bibasilar airspace disease.  Labs are most notable for albumin 2.9, WBC 17,100, lactic acid 2.1, and negative respiratory virus panel.  Patient was treated with Rocephin and azithromycin in the ED.  Review of Systems:  ROS limited by patient's clinical condition.  Past Medical History:  Diagnosis Date   Abnormality of gait    Adenomatous colon polyp    Arthritis    Cataract    Dementia (HCC)    Depression    Diabetes mellitus without complication (HCC)    Fatty liver    Hyperlipidemia    Hypertension    Internal hemorrhoids    Other malaise and fatigue    Peripheral edema    Schizophrenia (HCC)    Type II or unspecified type diabetes mellitus without mention of complication, not stated as uncontrolled    Urinary frequency    Urinary retention     Past Surgical History:  Procedure Laterality Date   BALLOON DILATION N/A 05/22/2012   Procedure:  BALLOON DILATION;  Surgeon: Theda Belfast, MD;  Location: [facility];  Service: Endoscopy;  Laterality: N/A;   BALLOON DILATION N/A 06/09/2015   Procedure: BALLOON DILATION;  Surgeon: Jeani Hawking, MD;  Location: [facility];  Service: Endoscopy;  Laterality: N/A;   ESOPHAGOGASTRODUODENOSCOPY  02/21/2012   Procedure: ESOPHAGOGASTRODUODENOSCOPY (EGD);  Surgeon: Theda Belfast, MD;  Location: [facility];  Service: Endoscopy;  Laterality: N/A;   ESOPHAGOGASTRODUODENOSCOPY N/A 05/22/2012   Procedure: ESOPHAGOGASTRODUODENOSCOPY (EGD);  Surgeon: Theda Belfast, MD;  Location: [facility];  Service: Endoscopy;  Laterality: N/A;   ESOPHAGOGASTRODUODENOSCOPY N/A 06/17/2014   Procedure: ESOPHAGOGASTRODUODENOSCOPY (EGD);  Surgeon: Jeani Hawking, MD;  Location: [facility];  Service: Endoscopy;  Laterality: N/A;   ESOPHAGOGASTRODUODENOSCOPY N/A 08/10/2014   Procedure: ESOPHAGOGASTRODUODENOSCOPY (EGD);  Surgeon: Jeani Hawking, MD;  Location: [facility];  Service: Endoscopy;  Laterality: N/A;   ESOPHAGOGASTRODUODENOSCOPY N/A 05/28/2015   Procedure: ESOPHAGOGASTRODUODENOSCOPY (EGD);  Surgeon: Iva Boop, MD;  Location: [facility];  Service: Endoscopy;  Laterality: N/A;   ESOPHAGOGASTRODUODENOSCOPY (EGD) WITH PROPOFOL N/A 06/09/2015   Procedure: ESOPHAGOGASTRODUODENOSCOPY (EGD) WITH PROPOFOL;  Surgeon: Jeani Hawking, MD;  Location: [facility];  Service: Endoscopy;  Laterality: N/A;   SAVORY DILATION N/A 06/17/2014   Procedure: SAVORY DILATION;  Surgeon: Jeani Hawking, MD;  Location: [facility];  Service: Endoscopy;  Laterality: N/A;   TEE WITHOUT CARDIOVERSION N/A 01/20/2018   Procedure: TRANSESOPHAGEAL ECHOCARDIOGRAM (TEE);  Surgeon: Jake Bathe, MD;  Location: [facility] END[facility];  Service: Cardiovascular;  Laterality: N/A;   TONSILLECTOMY      Social History:   reports that he has never smoked. He has never used smokeless tobacco. He reports that he does not drink alcohol and does not use  drugs.  Allergies  Allergen Reactions   Asa [Aspirin]     "Dr told me not to take it"   Codeine Other (See Comments)    DIZZINESS with Tylenol 3   Tylenol With Codeine #3 [Acetaminophen-Codeine]     Other reaction(s): Unknown   Tylenol [Acetaminophen] Other (See Comments)    Dizziness with tylenol 3    Family History  Problem Relation Age of Onset   Brain cancer Father      Prior to Admission medications   Medication Sig Start Date End Date Taking? Authorizing Provider  amLODipine (NORVASC) 2.5 MG tablet  01/18/20   [provider]  amLODipine (NORVASC) 5 MG tablet Take 5 mg by mouth daily. 09/04/12   [provider]  Calcium Citrate-Vitamin D (CITRACAL + D PO) Take 1 tablet by mouth daily.    [provider]  Cholecalciferol (VITAMIN D-3) 1000 units CAPS Take 1 capsule by mouth daily.    [provider]  docusate sodium (COLACE) 100 MG capsule Take 100 mg by mouth 2 (two) times daily.    [provider]  empagliflozin (JARDIANCE) 10 MG TABS tablet Take 10 mg by mouth daily. 01/22/18   Rhetta Mura, MD  fluvoxaMINE (LUVOX) 100 MG tablet Take 1 tablet (100 mg total) by mouth at bedtime. 01/22/18   Rhetta Mura, MD  furosemide (LASIX) 20 MG tablet Take 20 mg by mouth daily before breakfast.  12/07/11   [provider]  HUMALOG KWIKPEN 100 UNIT/ML KiwkPen Inject 2-11 Units into the skin See admin instructions. Times: 0630 1130 1630  Scale: 101-150: 2 units 151-200: 3 units 201-250: 5 units 251-300: 7 units 301-350: 9 units >350: 11 units 09/30/17   [provider]  insulin detemir (LEVEMIR) 100 UNIT/ML injection Inject 0.3 mLs (30 Units total) into the skin at bedtime. Patient taking differently: Inject 24 Units into the skin at bedtime. 01/22/18   Rhetta Mura, MD  insulin glargine (LANTUS) 100 UNIT/ML injection 25 u    [provider]  insulin regular (NOVOLIN R) 100 units/mL injection  ss    [provider]  meloxicam (MOBIC) 15 MG tablet 1 tablet    [provider]  Menthol, Topical Analgesic, (BIOFREEZE) 4 % GEL Apply topically.    [provider]  metFORMIN (GLUCOPHAGE) 500 MG tablet 1 tablet with meals    [provider]  Multiple Vitamins-Minerals (THERAGRAN-M PREMIER 50 PLUS PO) Take 1 tablet by mouth daily.    [provider]  MYRBETRIQ 25 MG TB24 tablet Take 25 mg by mouth daily.  10/05/16   [provider]  OLANZapine (ZYPREXA) 5 MG tablet Take 5 tablets (25 mg total) by mouth daily. 01/22/18   Rhetta Mura, MD  Omega-3 Fatty Acids (FISH OIL) 1200 MG CAPS     [provider]  omeprazole (PRILOSEC) 40 MG capsule Take 40 mg by mouth daily.  09/28/12   [provider]  polyethylene glycol powder (GLYCOLAX/MIRALAX) 17 GM/SCOOP powder Take 1 Container by mouth once.    [provider]  pravastatin (PRAVACHOL) 40 MG tablet Take 40 mg by mouth daily after lunch.     [provider]  tamsulosin (FLOMAX) 0.4 MG CAPS capsule Take 0.4 mg by mouth daily.  10/01/16  [provider]  traMADol (ULTRAM) 50 MG tablet Take 1 tablet (50 mg total) by mouth every 6 (six) hours as needed. 09/10/20   Cathren Laine, MD  zolpidem (AMBIEN) 5 MG tablet Take 1 tablet (5 mg total) by mouth at bedtime. Patient taking differently: Take 5 mg by mouth at bedtime. 1/2 tab 01/22/18   Rhetta Mura, MD    Physical Exam: Vitals:   09/23/22 2015 09/23/22 2100 09/23/22 2115 09/23/22 2130  BP: 101/69 102/60 (!) 101/56 102/64  Pulse: 100 94 (!) 102 94  Resp: (!) 24 (!) 21 (!) 23 (!) 23  Temp:      TempSrc:      SpO2: 92% 91% (!) 88% (!) 89%  Weight:         Constitutional: NAD, calm  Eyes: PERTLA, lids and conjunctivae normal ENMT: Mucous membranes are moist. Posterior pharynx clear of any exudate or lesions.   Neck: supple, no masses  Respiratory: no wheezing, no crackles. No accessory  muscle use.  Cardiovascular: S1 & S2 heard, regular rate and rhythm. No extremity edema.   Abdomen: No distension, no tenderness, soft. Bowel sounds active.  Musculoskeletal: no clubbing / cyanosis. No joint deformity upper and lower extremities.   Skin: no significant rashes, lesions, ulcers. Warm, dry, well-perfused. Neurologic: CN 2-12 grossly intact. Moving all extremities. Alert, oriented to self and knows he is in the hospital.  Psychiatric: Calm. Cooperative.    Labs and Imaging on Admission: I have personally reviewed following labs and imaging studies  CBC: Recent Labs  Lab 09/23/22 1730  WBC 17.1*  NEUTROABS 15.5*  HGB 11.8*  HCT 37.4*  MCV 93.7  PLT 269   Basic Metabolic Panel: Recent Labs  Lab 09/23/22 1730  NA 133*  K 3.9  CL 97*  CO2 26  GLUCOSE 271*  BUN 21  CREATININE 1.08  CALCIUM 8.5*   GFR: CrCl cannot be calculated (Unknown ideal weight.). Liver Function Tests: Recent Labs  Lab 09/23/22 1730  AST 18  ALT 19  ALKPHOS 93  BILITOT 0.6  PROT 7.3  ALBUMIN 2.9*   No results for input(s): "LIPASE", "AMYLASE" in the last 168 hours. No results for input(s): "AMMONIA" in the last 168 hours. Coagulation Profile: Recent Labs  Lab 09/23/22 1730  INR 1.1   Cardiac Enzymes: No results for input(s): "CKTOTAL", "CKMB", "CKMBINDEX", "TROPONINI" in the last 168 hours. BNP (last 3 results) No results for input(s): "PROBNP" in the last 8760 hours. HbA1C: No results for input(s): "HGBA1C" in the last 72 hours. CBG: No results for input(s): "GLUCAP" in the last 168 hours. Lipid Profile: No results for input(s): "CHOL", "HDL", "LDLCALC", "TRIG", "CHOLHDL", "LDLDIRECT" in the last 72 hours. Thyroid Function Tests: No results for input(s): "TSH", "T4TOTAL", "FREET4", "T3FREE", "THYROIDAB" in the last 72 hours. Anemia Panel: No results for input(s): "VITAMINB12", "FOLATE", "FERRITIN", "TIBC", "IRON", "RETICCTPCT" in the last 72 hours. Urine analysis:     Component Value Date/Time   COLORURINE YELLOW 01/16/2018 2143   APPEARANCEUR CLEAR 01/16/2018 2143   LABSPEC 1.013 01/16/2018 2143   PHURINE 6.0 01/16/2018 2143   GLUCOSEU >=500 (A) 01/16/2018 2143   HGBUR NEGATIVE 01/16/2018 2143   BILIRUBINUR NEGATIVE 01/16/2018 2143   KETONESUR NEGATIVE 01/16/2018 2143   PROTEINUR NEGATIVE 01/16/2018 2143   UROBILINOGEN 0.2 12/10/2012 1322   NITRITE NEGATIVE 01/16/2018 2143   LEUKOCYTESUR NEGATIVE 01/16/2018 2143   Sepsis Labs: @LABRCNTIP (procalcitonin:4,lacticidven:4) ) Recent Results (from the past 240 hour(s))  Blood Culture (routine x 2)  Status: None (Preliminary result)   Collection Time: 09/23/22  5:30 PM   Specimen: BLOOD LEFT FOREARM  Result Value Ref Range Status   Specimen Description   Final    BLOOD LEFT FOREARM Performed at [facility], [address], [place], [place] [address]    Special Requests   Final    BOTTLES DRAWN AEROBIC AND ANAEROBIC Blood Culture results may not be optimal due to an excessive volume of blood received in culture bottles Performed at [facility], [address], [place], [place] [address]    Culture PENDING  Incomplete   Report Status PENDING  Incomplete  Blood Culture (routine x 2)     Status: None (Preliminary result)   Collection Time: 09/23/22  5:35 PM   Specimen: BLOOD LEFT ARM  Result Value Ref Range Status   Specimen Description   Final    BLOOD LEFT ARM Performed at [facility], [address] [place], [place] [address]    Special Requests   Final    BOTTLES DRAWN AEROBIC AND ANAEROBIC Blood Culture adequate volume Performed at [facility], [address], [place], [place] [address]    Culture PENDING  Incomplete   Report Status PENDING  Incomplete  Resp panel by RT-PCR (RSV, Flu A&B, Covid) Anterior Nasal Swab     Status: None   Collection Time: 09/23/22  5:49 PM   Specimen: Anterior Nasal Swab  Result Value Ref Range  Status   SARS Coronavirus 2 by RT PCR NEGATIVE NEGATIVE Final    Comment: (NOTE) SARS-CoV-2 target nucleic acids are NOT DETECTED.  The SARS-CoV-2 RNA is generally detectable in upper respiratory specimens during the acute phase of infection. The lowest concentration of SARS-CoV-2 viral copies this assay can detect is 138 copies/mL. A negative result does not preclude SARS-Cov-2 infection and should not be used as the sole basis for treatment or other patient management decisions. A negative result may occur with  improper specimen collection/handling, submission of specimen other than nasopharyngeal swab, presence of viral mutation(s) within the areas targeted by this assay, and inadequate number of viral copies(<138 copies/mL). A negative result must be combined with clinical observations, patient history, and epidemiological information. The expected result is Negative.  Fact Sheet for Patients:  BloggerCourse.com  Fact Sheet for Healthcare Providers:  SeriousBroker.it  This test is no t yet approved or cleared by the [place] FDA and  has been authorized for detection and/or diagnosis of SARS-CoV-2 by FDA under an Emergency Use Authorization (EUA). This EUA will remain  in effect (meaning this test can be used) for the duration of the COVID-19 declaration under Section 564(b)(1) of the Act, 21 U.S.C.section 360bbb-3(b)(1), unless the authorization is terminated  or revoked sooner.       Influenza A by PCR NEGATIVE NEGATIVE Final   Influenza B by PCR NEGATIVE NEGATIVE Final    Comment: (NOTE) The Xpert Xpress SARS-CoV-2/FLU/RSV plus assay is intended as an aid in the diagnosis of influenza from Nasopharyngeal swab specimens and should not be used as a sole basis for treatment. Nasal washings and aspirates are unacceptable for Xpert Xpress SARS-CoV-2/FLU/RSV testing.  Fact Sheet for  Patients: BloggerCourse.com  Fact Sheet for Healthcare Providers: SeriousBroker.it  This test is not yet approved or cleared by the [place] FDA and has been authorized for detection and/or diagnosis of SARS-CoV-2 by FDA under an Emergency Use Authorization (EUA). This EUA will remain in effect (meaning this test  can be used) for the duration of the COVID-19 declaration under Section 564(b)(1) of the Act, 21 U.S.C. section 360bbb-3(b)(1), unless the authorization is terminated or revoked.     Resp Syncytial Virus by PCR NEGATIVE NEGATIVE Final    Comment: (NOTE) Fact Sheet for Patients: BloggerCourse.com  Fact Sheet for Healthcare Providers: SeriousBroker.it  This test is not yet approved or cleared by the [place] FDA and has been authorized for detection and/or diagnosis of SARS-CoV-2 by FDA under an Emergency Use Authorization (EUA). This EUA will remain in effect (meaning this test can be used) for the duration of the COVID-19 declaration under Section 564(b)(1) of the Act, 21 U.S.C. section 360bbb-3(b)(1), unless the authorization is terminated or revoked.  Performed at [facility], [address] [place], [place] [address]      Radiological Exams on Admission: DG Chest Port 1 View  Result Date: 09/23/2022 CLINICAL DATA:  Questionable sepsis. EXAM: PORTABLE CHEST 1 VIEW COMPARISON:  01/16/2018 FINDINGS: Mildly degraded exam due to AP portable technique and patient body habitus. Reverse apical lordotic positioning. Mild cardiomegaly. Mild pulmonary venous congestion. Small left pleural effusion. Worsened left and developing medial right lung base airspace disease. IMPRESSION: Cardiomegaly with pulmonary venous congestion. Persistent small left pleural effusion with increased left-greater-than-right base airspace disease. Although this could be  atelectasis, especially at the left lung base, given history of sepsis, pneumonia is a concern. Electronically Signed   By: Jeronimo Greaves M.D.   On: 09/23/2022 18:42    EKG: Independently reviewed. Undetermined rhythm, rate 110, incomplete LBBB.   Assessment/Plan   1. Sepsis d/t pneumonia; acute hypoxic respiratory failure  - Continue Rocephin and azithromycin, continue supplemental O2 as needed, check strep pneumo and legionella antigens, trend procalcitonin    2. Insulin-dependent DM  - A1c was 7.6% in 2019  - Check CBGs and continue short- and long-acting insulin   3. Chronic diastolic CHF  - Appears compensated   - Hold Lasix initially given concern for sepsis, monitor volume status   4. Hypertension  - SBP was in the 90s in ED and Norvasc held on admission    5. Depression, anxiety, insomnia, dementia  - Continue fluvoxamine, Zyprexa, and Ambien - Use delirium precautions     DVT prophylaxis: Lovenox  Code Status: DNR  Level of Care: Level of care: Progressive Family Communication: none present  Disposition Plan:  Patient is from: SNF Anticipated d/c is to: SNF  Anticipated d/c date is: 09/26/22  Patient currently: Pending improved respiratory status  Consults called: None  Admission status: Inpatient     Briscoe Deutscher, MD [facility]  09/23/2022, 9:56 PM

## 2022-09-23 NOTE — ED Provider Notes (Signed)
[place] EMERGENCY DEPARTMENT AT [facility] Provider Note   CSN: [id number] Arrival date & time: 09/23/22  1647     History  Chief Complaint  Patient presents with   Shortness of Breath    Ricky Ghannam. is a 82 y.o. male.  82 year old male with past medical history of diabetes, hypertension, dementia, and schizophrenia presenting to the emergency department from his nursing home with concern for low oxygen saturations.  The patient appeared to be working a little harder to breathe today and when nursing staff checked his pulse ox it was in the 70s.  The patient was placed on nasal cannula oxygen and improved to 86%.  Medics placed patient on nonrebreather with improvement and he was brought to the emergency department for further evaluation.  I also reported that the patient has been urinating more than normal over concern for possible urinary tract infection.  The patient is unable to provide any further history based on his baseline mental status.  The patient's labs were revealing for leukocytosis.  The patient's lactic acid is mildly elevated.  His x-ray does show findings concerning for pneumonia.  Calls placed to hospitalist service for admission.  Critical care time 36 minutes including reassessments, review of old records, treatment of sepsis and hypoxia, coordination care with hospitalist service  The history is provided by the EMS personnel.       Home Medications Prior to Admission medications   Medication Sig Start Date End Date Taking? Authorizing Provider  amLODipine (NORVASC) 2.5 MG tablet  01/18/20   [provider]  amLODipine (NORVASC) 5 MG tablet Take 5 mg by mouth daily. 09/04/12   [provider]  Calcium Citrate-Vitamin D (CITRACAL + D PO) Take 1 tablet by mouth daily.    [provider]  Cholecalciferol (VITAMIN D-3) 1000 units CAPS Take 1 capsule by mouth daily.    [provider]  docusate sodium  (COLACE) 100 MG capsule Take 100 mg by mouth 2 (two) times daily.    [provider]  empagliflozin (JARDIANCE) 10 MG TABS tablet Take 10 mg by mouth daily. 01/22/18   Rhetta Mura, MD  fluvoxaMINE (LUVOX) 100 MG tablet Take 1 tablet (100 mg total) by mouth at bedtime. 01/22/18   Rhetta Mura, MD  furosemide (LASIX) 20 MG tablet Take 20 mg by mouth daily before breakfast.  12/07/11   [provider]  HUMALOG KWIKPEN 100 UNIT/ML KiwkPen Inject 2-11 Units into the skin See admin instructions. Times: 0630 1130 1630  Scale: 101-150: 2 units 151-200: 3 units 201-250: 5 units 251-300: 7 units 301-350: 9 units >350: 11 units 09/30/17   [provider]  insulin detemir (LEVEMIR) 100 UNIT/ML injection Inject 0.3 mLs (30 Units total) into the skin at bedtime. Patient taking differently: Inject 24 Units into the skin at bedtime. 01/22/18   Rhetta Mura, MD  insulin glargine (LANTUS) 100 UNIT/ML injection 25 u    [provider]  insulin regular (NOVOLIN R) 100 units/mL injection ss    [provider]  meloxicam (MOBIC) 15 MG tablet 1 tablet    [provider]  Menthol, Topical Analgesic, (BIOFREEZE) 4 % GEL Apply topically.    [provider]  metFORMIN (GLUCOPHAGE) 500 MG tablet 1 tablet with meals    [provider]  Multiple Vitamins-Minerals (THERAGRAN-M PREMIER 50 PLUS PO) Take 1 tablet by mouth daily.    [provider]  MYRBETRIQ 25 MG TB24 tablet Take 25 mg by  mouth daily.  10/05/16   [provider]  OLANZapine (ZYPREXA) 5 MG tablet Take 5 tablets (25 mg total) by mouth daily. 01/22/18   Rhetta Mura, MD  Omega-3 Fatty Acids (FISH OIL) 1200 MG CAPS     [provider]  omeprazole (PRILOSEC) 40 MG capsule Take 40 mg by mouth daily.  09/28/12   [provider]  polyethylene glycol powder (GLYCOLAX/MIRALAX) 17 GM/SCOOP powder Take 1 Container by mouth once.     [provider]  pravastatin (PRAVACHOL) 40 MG tablet Take 40 mg by mouth daily after lunch.     [provider]  tamsulosin (FLOMAX) 0.4 MG CAPS capsule Take 0.4 mg by mouth daily.  10/01/16   [provider]  traMADol (ULTRAM) 50 MG tablet Take 1 tablet (50 mg total) by mouth every 6 (six) hours as needed. 09/10/20   Cathren Laine, MD  zolpidem (AMBIEN) 5 MG tablet Take 1 tablet (5 mg total) by mouth at bedtime. Patient taking differently: Take 5 mg by mouth at bedtime. 1/2 tab 01/22/18   Rhetta Mura, MD      Allergies    Asa [aspirin], Codeine, Tylenol with codeine #3 [acetaminophen-codeine], and Tylenol [acetaminophen]    Review of Systems   Review of Systems  Reason unable to perform ROS: Baseline mental status.    Physical Exam Updated Vital Signs BP 101/69   Pulse 98   Temp (!) 101 F (38.3 C) (Axillary)   Resp (!) 24   Wt 98.4 kg   SpO2 93%   BMI 31.59 kg/m  Physical Exam Vitals and nursing note reviewed.   Gen: Chronically ill appearing, NAD Eyes: PERRL, EOMI HEENT: no oropharyngeal swelling Neck: trachea midline Resp: Diminished at bilateral lung fields with scattered rhonchi noted Card: RRR, no murmurs, rubs, or gallops Abd: nontender, nondistended Extremities: no calf tenderness, no edema Vascular: 2+ radial pulses bilaterally, 2+ DP pulses bilaterally Skin: no rashes Psyc: acting appropriately   ED Results / Procedures / Treatments   Labs (all labs ordered are listed, but only abnormal results are displayed) Labs Reviewed  COMPREHENSIVE METABOLIC PANEL - Abnormal; Notable for the following components:      Result Value   Sodium 133 (*)    Chloride 97 (*)    Glucose, Bld 271 (*)    Calcium 8.5 (*)    Albumin 2.9 (*)    All other components within normal limits  CBC WITH DIFFERENTIAL/PLATELET - Abnormal; Notable for the following components:   WBC 17.1 (*)    RBC 3.99 (*)    Hemoglobin 11.8 (*)    HCT 37.4 (*)     Neutro Abs 15.5 (*)    Abs Immature Granulocytes 0.08 (*)    All other components within normal limits  I-STAT CG4 LACTIC ACID, ED - Abnormal; Notable for the following components:   Lactic Acid, Venous 2.1 (*)    All other components within normal limits  CULTURE, BLOOD (ROUTINE X 2)  CULTURE, BLOOD (ROUTINE X 2)  RESP PANEL BY RT-PCR (RSV, FLU A&B, COVID)  RVPGX2  PROTIME-INR  APTT  URINALYSIS, W/ REFLEX TO CULTURE (INFECTION SUSPECTED)  I-STAT CG4 LACTIC ACID, ED    EKG EKG Interpretation Date/Time:  Monday September 23 2022 17:05:43 EDT Ventricular Rate:  110 PR Interval:    QRS Duration:  116 QT Interval:  326 QTC Calculation: 441 R Axis:   10  Text Interpretation: Atrial fibrillation Incomplete left bundle branch block Borderline low voltage, extremity leads Confirmed  by Beckey Downing [phone]) on 09/23/2022 5:16:16 PM  Radiology DG Chest Port 1 View  Result Date: 09/23/2022 CLINICAL DATA:  Questionable sepsis. EXAM: PORTABLE CHEST 1 VIEW COMPARISON:  01/16/2018 FINDINGS: Mildly degraded exam due to AP portable technique and patient body habitus. Reverse apical lordotic positioning. Mild cardiomegaly. Mild pulmonary venous congestion. Small left pleural effusion. Worsened left and developing medial right lung base airspace disease. IMPRESSION: Cardiomegaly with pulmonary venous congestion. Persistent small left pleural effusion with increased left-greater-than-right base airspace disease. Although this could be atelectasis, especially at the left lung base, given history of sepsis, pneumonia is a concern. Electronically Signed   By: Jeronimo Greaves M.D.   On: 09/23/2022 18:42    Procedures Procedures    Medications Ordered in ED Medications  cefTRIAXone (ROCEPHIN) 2 g in sodium chloride 0.9 % 100 mL IVPB (0 g Intravenous Stopped 09/23/22 1913)  azithromycin (ZITHROMAX) 500 mg in sodium chloride 0.9 % 250 mL IVPB (0 mg Intravenous Stopped 09/23/22 1939)    ED Course/ Medical Decision  Making/ A&P                                 Medical Decision Making 82 year old male with past medical history of dementia, hypertension, hyperlipidemia, and schizophrenia presenting to the emergency department today with concerns for hypoxia as well as possible urinary tract infection.  The patient is stable on facemask oxygen currently.  I will initiate a sepsis workup on the patient.  I will cover him empirically with Rocephin and azithromycin which should also cover for urinary source as well that were the case.  The patient did receive IV fluids with medics.  His initial blood pressures are in the 90 systolic.  It appears that this has improved with minimal fluids.  He does it.  The patient is on Lasix at baseline so hold off on further fluids until his lactic acid results.  Obviously if his hemodynamics change he may require sepsis fluids but I will hold off at this time.  He will likely require admission.  The patient's EKG interpreted by me shows atrial fibrillation with a rate of 110 with normal axis, normal intervals, nonspecific ST-T changes.  Amount and/or Complexity of Data Reviewed Labs: ordered. Radiology: ordered. ECG/medicine tests: ordered.  Risk Prescription drug management. Decision regarding hospitalization.           Final Clinical Impression(s) / ED Diagnoses Final diagnoses:  Pneumonia due to infectious organism, unspecified laterality, unspecified part of lung  Sepsis, due to unspecified organism, unspecified whether acute organ dysfunction present [facility])    Rx / DC Orders ED Discharge Orders     None         Durwin Glaze, MD 09/23/22 2059

## 2022-09-23 NOTE — Sepsis Progress Note (Signed)
Sepsis protocol monitored by eLink ?

## 2022-09-23 NOTE — ED Notes (Signed)
EDP at BS 

## 2022-09-24 DIAGNOSIS — A419 Sepsis, unspecified organism: Secondary | ICD-10-CM | POA: Diagnosis not present

## 2022-09-24 DIAGNOSIS — J189 Pneumonia, unspecified organism: Secondary | ICD-10-CM | POA: Diagnosis not present

## 2022-09-24 LAB — BASIC METABOLIC PANEL
Anion gap: 9 (ref 5–15)
BUN: 20 mg/dL (ref 8–23)
CO2: 25 mmol/L (ref 22–32)
Calcium: 8.3 mg/dL — ABNORMAL LOW (ref 8.9–10.3)
Chloride: 103 mmol/L (ref 98–111)
Creatinine, Ser: 1.08 mg/dL (ref 0.61–1.24)
GFR, Estimated: >60 mL/min (ref >60)
Glucose, Bld: 136 mg/dL — ABNORMAL HIGH (ref 70–99)
Potassium: 3.8 mmol/L (ref 3.5–5.1)
Sodium: 137 mmol/L (ref 135–145)

## 2022-09-24 LAB — GLUCOSE, CAPILLARY
Glucose-Capillary: 129 mg/dL — ABNORMAL HIGH (ref 70–99)
Glucose-Capillary: 207 mg/dL — ABNORMAL HIGH (ref 70–99)
Glucose-Capillary: 119 mg/dL — ABNORMAL HIGH (ref 70–99)
Glucose-Capillary: 272 mg/dL — ABNORMAL HIGH (ref 70–99)

## 2022-09-24 LAB — CBC
HCT: 35.5 % — ABNORMAL LOW (ref 39.0–52.0)
Hemoglobin: 11 g/dL — ABNORMAL LOW (ref 13.0–17.0)
MCH: 29.8 pg (ref 26.0–34.0)
MCHC: 31 g/dL (ref 30.0–36.0)
MCV: 96.2 fL (ref 80.0–100.0)
Platelets: 198 10*3/uL (ref 150–400)
RBC: 3.69 MIL/uL — ABNORMAL LOW (ref 4.22–5.81)
RDW: 14.7 % (ref 11.5–15.5)
WBC: 14.8 10*3/uL — ABNORMAL HIGH (ref 4.0–10.5)
nRBC: 0 % (ref 0.0–0.2)

## 2022-09-24 LAB — STREP PNEUMONIAE URINARY ANTIGEN: Strep Pneumo Urinary Antigen: NEGATIVE

## 2022-09-24 LAB — PROCALCITONIN: Procalcitonin: 0.46 ng/mL

## 2022-09-24 LAB — HEMOGLOBIN A1C
Hgb A1c MFr Bld: 8.8 % — ABNORMAL HIGH (ref 4.8–5.6)
Mean Plasma Glucose: 205.86 mg/dL

## 2022-09-24 MED ORDER — INFLUENZA VAC A&B SURF ANT ADJ 0.5 ML IM SUSY
0.5000 mL | PREFILLED_SYRINGE | INTRAMUSCULAR | Status: AC
  Administered 2022-09-25: 0.5 mL via INTRAMUSCULAR
  Filled 2022-09-24: qty 0.5

## 2022-09-24 MED ORDER — ORAL CARE MOUTH RINSE: 15.0000 mL | OROMUCOSAL | Status: DC | PRN

## 2022-09-24 MED ORDER — SODIUM CHLORIDE 0.9 % IV SOLN: INTRAVENOUS | Status: DC

## 2022-09-24 NOTE — Plan of Care (Signed)
  Problem: Activity: Goal: Ability to tolerate increased activity will improve Outcome: Not Progressing   Problem: Clinical Measurements: Goal: Ability to maintain a body temperature in the normal range will improve Outcome: Not Progressing   

## 2022-09-24 NOTE — Progress Notes (Signed)
PROGRESS NOTE  Ricky [name].  DOB: [birth year]  PCP: Renford Dills, MD ZOX:[id number]  DOA: 09/23/2022  LOS: 1 day  Hospital Day: 2  Brief narrative: Ricky [name]. is a 82 y.o. male with PMH significant for dementia, schizophrenia, DM2, HTN, HLD, CHF, fatty liver who is a long-term resident at [facility] nursing facility 9/2, patient was sent to the ED with concern of UTI and shortness of breath. At the facility, patient was noted to have labored breathing, O2 sat was noted to be in 80s on room air and was starting supplemental oxygen.  Patient also had a strong urine order.  In the ED, initial blood pressure was low at 96/50 which improved with IV fluid. He had a fever of 101, tachycardic to 100s, tachypneic to 20s and required 4 L oxygen by nasal cannula Labs with WBC count 17.1, hemoglobin 11.8, sodium 133, glucose 271, lactic acid 2.1 Urinalysis with hazy yellow urine with small amount of hemoglobin, large leukocytes, many bacteria Chest x-ray showed by basilar opacities L>R, small left pleural effusion, cardiomegaly with pulmonary venous congestion EKG with A-fib at 110 bpm, QTc 441 ms Patient was started on IV Rocephin, azithromycin Admitted to [facility]  Subjective: Patient was seen and examined this morning.  Pleasant elderly Caucasian male.  Was somnolent throughout my conversation.  No family at bedside. Overnight, temperature in 100s, blood pressure 100, remains on 4 L Labs this morning with WBC count 14.8, hemoglobin 11, sodium 137, lactic acid normalized to 1.2 Blood glucose this morning 129  Assessment and plan: Sepsis POA Bibasilar pneumonia Acute respiratory failure with hypoxia Sent from nursing home with labored breathing, hypoxia Met sepsis parameters with fever, leukocytosis, lactic acidosis, elevated procalcitonin level Chest x-ray with bibasilar infiltrates He has been started on IV Rocephin, azithromycin (QTc 441 ms) Continue bronchodilators,  antitussives as needed, breathing exercises, incentive spirometry Has a new oxygen requirement 4 L by nasal cannula.  Wean down as tolerated Monitor temperature and WBC trend.  Lactic acid level normalized Recent Labs  Lab 09/23/22 1730 09/23/22 1743 09/23/22 1942 09/24/22 0404  WBC 17.1*  --   --  14.8*  LATICACIDVEN  --  2.1* 1.2  --   PROCALCITON  --   --   --  0.46   UTI Urinalysis with hazy yellow urine with small amount of hemoglobin, large leukocytes, many bacteria  Urine culture and blood culture sent. Currently on IV antibiotics  Hyponatremia Sodium level improving Recent Labs  Lab 09/23/22 1730 09/24/22 0404  NA 133* 137   Type 2 diabetes mellitus with hyperglycemia A1c 8.8 on 09/24/2022 PTA meds- Lantus 22 units twice daily, Humalog Premeal 3 times daily, Jardiance 25 mg daily Currently on Semglee 10 units nightly and SSI/Accu-Cheks.  May need further adjustment. Recent Labs  Lab 09/23/22 2356 09/24/22 0729 09/24/22 1145  GLUCAP 210* 129* 207*   Hypotension H/o HTN, chronic diastolic CHF  Blood pressure initially low in 90s due to sepsis.   CHF remains compensated  PTA meds-Lasix 10 mg daily,, amlodipine 2.5 mg daily   HLD Pravastatin  GERD PPI  BPH Flomax  Dementia  depression, anxiety, insomnia Continue fluvoxamine, Zyprexa, and Ambien Use delirium precautions     Mobility: PT eval ordered  Goals of care   Code Status: Limited: Do not attempt resuscitation (DNR) -DNR-LIMITED -Do Not Intubate/DNI      DVT prophylaxis:  enoxaparin (LOVENOX) injection 40 mg Start: 09/23/22 2200   Antimicrobials: IV Rocephin, azithromycin Fluid: NS  at 75 mL/h Consultants: None Family Communication: None at bedside  Status: Inpatient  Needs to continue in-hospital care: Pending culture reports, continue IV antibiotics Anticipated d/c to: Back to SNF likely   Diet:  Diet Order             Diet regular Room service appropriate? Yes; Fluid  consistency: Thin  Diet effective now                   Scheduled Meds:  enoxaparin (LOVENOX) injection  40 mg Subcutaneous Q24H   fluvoxaMINE  100 mg Oral QHS   insulin aspart  0-5 Units Subcutaneous QHS   insulin aspart  0-6 Units Subcutaneous TID WC   insulin glargine-yfgn  10 Units Subcutaneous QHS   OLANZapine  25 mg Oral Daily   pantoprazole  40 mg Oral Daily   pravastatin  40 mg Oral QPC lunch   sodium chloride flush  3 mL Intravenous Q12H   tamsulosin  0.4 mg Oral Daily   zolpidem  5 mg Oral QHS    PRN meds: acetaminophen **OR** acetaminophen, guaiFENesin, ondansetron **OR** ondansetron (ZOFRAN) IV, mouth rinse, senna-docusate   Infusions:   sodium chloride     azithromycin Stopped (09/23/22 1939)   cefTRIAXone (ROCEPHIN)  IV Stopped (09/23/22 1913)    Antimicrobials: Anti-infectives (From admission, onward)    Start     Dose/Rate Route Frequency Ordered Stop   09/23/22 1715  cefTRIAXone (ROCEPHIN) 2 g in sodium chloride 0.9 % 100 mL IVPB        2 g 200 mL/hr over 30 Minutes Intravenous Every 24 hours 09/23/22 1703 09/28/22 1714   09/23/22 1715  azithromycin (ZITHROMAX) 500 mg in sodium chloride 0.9 % 250 mL IVPB        500 mg 250 mL/hr over 60 Minutes Intravenous Every 24 hours 09/23/22 1703 09/28/22 1714       Nutritional status:  Body mass index is 32.04 kg/m.          Objective: Vitals:   09/24/22 0422 09/24/22 0948  BP: 105/61 107/60  Pulse: 90 84  Resp: 19   Temp: 99.7 F (37.6 C) 98.2 F (36.8 C)  SpO2: 91% 90%    Intake/Output Summary (Last 24 hours) at 09/24/2022 1448 Last data filed at 09/24/2022 1011 Gross per 24 hour  Intake 1813 ml  Output --  Net 1813 ml   Filed Weights   09/23/22 1715 09/23/22 2355  Weight: 98.4 kg 98.4 kg   Weight change:  Body mass index is 32.04 kg/m.   Physical Exam: General exam: Elderly Caucasian male.  Not in pain Skin: No rashes, lesions or ulcers. HEENT: Atraumatic, normocephalic, no  obvious bleeding Lungs: Shallow respiratory effort.  Otherwise clear to auscultation CVS: Regular rate and rhythm, no murmur GI/Abd soft, nontender, nondistended, bowel sound present CNS: Somnolent, opens eyes to verbal command, falls right back to sleep Psychiatry: Mood appropriate Extremities: No pedal edema, no calf tenderness  Data Review: I have personally reviewed the laboratory data and studies available.  F/u labs ordered Unresulted Labs (From admission, onward)     Start     Ordered   09/30/22 0500  Creatinine, serum  (enoxaparin (LOVENOX)    CrCl >/= 30 ml/min)  Weekly,   R     Comments: while on enoxaparin therapy    09/23/22 2156   09/24/22 0500  Basic metabolic panel  Daily,   R      09/23/22 2156   09/24/22 0500  CBC  Daily,   R      09/23/22 2156   09/24/22 0500  Procalcitonin  Daily,   R     References:    Procalcitonin Lower Respiratory Tract Infection AND Sepsis Procalcitonin Algorithm   09/23/22 2156   09/23/22 2156  Legionella Pneumophila Serogp 1 Ur Ag  (COPD / Pneumonia / Cellulitis / Lower Extremity Wound)  Once,   R        09/23/22 2156   09/23/22 2040  Urine Culture  Once,   R        09/23/22 2040            Total time spent in review of labs and imaging, patient evaluation, formulation of plan, documentation and communication with family: 55 minutes  Signed, Lorin Glass, MD [facility] 09/24/2022

## 2022-09-24 NOTE — Evaluation (Addendum)
Physical Therapy Evaluation Patient Details Name: Ricky [name]. MRN: [id number] DOB: 1941/01/08 Today's Date: 09/24/2022  History of Present Illness  82 yo male admitted with Pna, sepsis. Hx of CVA, dementia, schizophrenia, CKD  Clinical Impression  On eval, pt required Max A to roll to R and L sides. Multimodal cueing required. L side weaker than R-pt reports chronic weakness. Repositioned in bed in preparation for pt dinner tray arrival. No family present during session. TOC reports pt is LTC SNF resident. Recommend return to SNF once medically ready.  Recommend OOB<>chair with lift equipment with nursing staff as able.       If plan is discharge home, recommend the following: Two people to help with walking and/or transfers;Two people to help with bathing/dressing/bathroom;Assistance with feeding;Assistance with cooking/housework;Direct supervision/assist for financial management;Assist for transportation;Help with stairs or ramp for entrance;Direct supervision/assist for medications management   Can travel by private vehicle        Equipment Recommendations None recommended by PT  Recommendations for Other Services       Functional Status Assessment Patient has had a recent decline in their functional status and/or demonstrates limited ability to make significant improvements in function in a reasonable and predictable amount of time     Precautions / Restrictions Precautions Precautions: Fall Precaution Comments: L side weakness-chronic Restrictions Weight Bearing Restrictions: No      Mobility  Bed Mobility Overal bed mobility: Needs Assistance Bed Mobility: Rolling Rolling: Max assist         General bed mobility comments: Max A to roll to L and R sides. Multimodal cueing required. Increased time.    Transfers                        Ambulation/Gait                  Stairs            Wheelchair Mobility     Tilt Bed     Modified Rankin (Stroke Patients Only)       Balance                                             Pertinent Vitals/Pain Pain Assessment Pain Assessment: Faces Faces Pain Scale: No hurt    Home Living Family/patient expects to be discharged to:: Skilled nursing facility                   Additional Comments: LTC SNF resident    Prior Function Prior Level of Function : Needs assist                     Extremity/Trunk Assessment   Upper Extremity Assessment Upper Extremity Assessment: Generalized weakness    Lower Extremity Assessment Lower Extremity Assessment: Generalized weakness (L side weaker than R. Strength R at least 3/5)       Communication   Communication Cueing Techniques: Verbal cues  Cognition Arousal: Alert (but drowsy) Behavior During Therapy: WFL for tasks assessed/performed Overall Cognitive Status: Difficult to assess                                          General Comments      Exercises  General Exercises - Lower Extremity Heel Slides: AROM, AAROM, Both, 5 reps, Supine   Assessment/Plan    PT Assessment Patient needs continued PT services  PT Problem List Decreased strength;Decreased range of motion;Decreased activity tolerance;Decreased balance;Decreased mobility;Decreased cognition       PT Treatment Interventions Functional mobility training;Therapeutic activities;Therapeutic exercise;Balance training;Patient/family education    PT Goals (Current goals can be found in the Care Plan section)  Acute Rehab PT Goals Patient Stated Goal: none stated PT Goal Formulation: Patient unable to participate in goal setting Time For Goal Achievement: 10/08/22 Potential to Achieve Goals: Poor    Frequency Min 1X/week     Co-evaluation               AM-PAC PT "6 Clicks" Mobility  Outcome Measure Help needed turning from your back to your side while in a flat bed without using  bedrails?: A Lot Help needed moving from lying on your back to sitting on the side of a flat bed without using bedrails?: Total Help needed moving to and from a bed to a chair (including a wheelchair)?: Total Help needed standing up from a chair using your arms (e.g., wheelchair or bedside chair)?: Total Help needed to walk in hospital room?: Total Help needed climbing 3-5 steps with a railing? : Total 6 Click Score: 7    End of Session   Activity Tolerance: Patient tolerated treatment well Patient left: in bed;with call bell/phone within reach;with bed alarm set   PT Visit Diagnosis: Muscle weakness (generalized) (M62.81);Hemiplegia and hemiparesis Hemiplegia - Right/Left: Left    Time: 9562-1308 PT Time Calculation (min) (ACUTE ONLY): 12 min   Charges:   PT Evaluation $PT Eval Low Complexity: 1 Low   PT General Charges $$ ACUTE PT VISIT: 1 Visit           Faye Ramsay, PT Acute Rehabilitation  Office: [phone]

## 2022-09-24 NOTE — Plan of Care (Signed)
  Problem: Activity: Goal: Ability to tolerate increased activity will improve Outcome: Progressing   Problem: Respiratory: Goal: Ability to maintain adequate ventilation will improve Outcome: Progressing Goal: Ability to maintain a clear airway will improve Outcome: Progressing   Problem: Education: Goal: Ability to describe self-care measures that may prevent or decrease complications (Diabetes Survival Skills Education) will improve Outcome: Progressing Goal: Individualized Educational Video(s) Outcome: Progressing   Problem: Coping: Goal: Ability to adjust to condition or change in health will improve Outcome: Progressing   Problem: Fluid Volume: Goal: Ability to maintain a balanced intake and output will improve Outcome: Progressing   Problem: Health Behavior/Discharge Planning: Goal: Ability to identify and utilize available resources and services will improve Outcome: Progressing   Problem: Metabolic: Goal: Ability to maintain appropriate glucose levels will improve Outcome: Progressing   Problem: Nutritional: Goal: Maintenance of adequate nutrition will improve Outcome: Progressing Goal: Progress toward achieving an optimal weight will improve Outcome: Progressing   Problem: Skin Integrity: Goal: Risk for impaired skin integrity will decrease Outcome: Progressing   Problem: Tissue Perfusion: Goal: Adequacy of tissue perfusion will improve Outcome: Progressing   Problem: Education: Goal: Knowledge of General Education information will improve Description: Including pain rating scale, medication(s)/side effects and non-pharmacologic comfort measures Outcome: Progressing   Problem: Health Behavior/Discharge Planning: Goal: Ability to manage health-related needs will improve Outcome: Progressing   Problem: Clinical Measurements: Goal: Ability to maintain clinical measurements within normal limits will improve Outcome: Progressing Goal: Will remain free  from infection Outcome: Progressing Goal: Diagnostic test results will improve Outcome: Progressing Goal: Respiratory complications will improve Outcome: Progressing Goal: Cardiovascular complication will be avoided Outcome: Progressing   Problem: Activity: Goal: Risk for activity intolerance will decrease Outcome: Progressing   Problem: Nutrition: Goal: Adequate nutrition will be maintained Outcome: Progressing   Problem: Coping: Goal: Level of anxiety will decrease Outcome: Progressing   Problem: Elimination: Goal: Will not experience complications related to bowel motility Outcome: Progressing Goal: Will not experience complications related to urinary retention Outcome: Progressing   Problem: Pain Managment: Goal: General experience of comfort will improve Outcome: Progressing   Problem: Safety: Goal: Ability to remain free from injury will improve Outcome: Progressing   Problem: Skin Integrity: Goal: Risk for impaired skin integrity will decrease Outcome: Progressing  Ricky [name] is very kind. He is able to express his needs and wants. He has skinned up knees. Not sure when the fall happened due to his forgetfulness. However, he knows his name, date of birth, and were he is. He also knows the seasons and who the president is. Pt has lots of redness in his groin and bottom area. The skin is intact. He is very compliant with all requests.

## 2022-09-24 NOTE — TOC Initial Note (Addendum)
Transition of Care (TOC) - Initial/Assessment Note    Patient Details  Name: Ricky [name]. MRN: [id number] Date of Birth: [birth year]  Transition of Care [facility]) CM/SW Contact:    Howell Rucks, RN Phone Number: 09/24/2022, 10:34 AM  Clinical Narrative:  Call to pt's niece Harley Alto), to introduce role of  TOC/NCM and review for  dc planning. Lanora Manis confirmed pt is LTC at  [place] and [facility] is for him to return. TOC will continue to follow.   -10:45 Call to [facility] w/[facility], admissions coordinator,  confirmed pt LTC at [facility].       -3:27pm PT eval, await recommendation.                Barriers to Discharge: Continued Medical Work up   Patient Goals and CMS Choice Patient states their goals for this hospitalization and ongoing recovery are:: return to [email or web address].gov Compare Post Acute Care list provided to:: Patient Represenative (must comment) Harley Alto (Niece)  [phone] (Mobile))        Expected Discharge Plan and Services       Living arrangements for the past 2 months: Skilled Nursing Facility                                      Prior Living Arrangements/Services Living arrangements for the past 2 months: Skilled Nursing Facility Lives with:: Self   Do you feel safe going back to the place where you live?: Yes      Need for Family Participation in Patient Care: Yes (Comment) Care giver support system in place?: Yes (comment)   Criminal Activity/Legal Involvement Pertinent to Current Situation/Hospitalization: No - Comment as needed  Activities of Daily Living Home Assistive Devices/Equipment: Wheelchair ADL Screening (condition at time of admission) Patient's cognitive ability adequate to safely complete daily activities?: No (pt can be forgetful) Is the patient deaf or have difficulty hearing?: No Does the patient have difficulty seeing, even when wearing glasses/contacts?: No Does the patient  have difficulty concentrating, remembering, or making decisions?: Yes Patient able to express need for assistance with ADLs?: Yes Does the patient have difficulty dressing or bathing?: Yes Independently performs ADLs?: No Communication: Independent Dressing (OT): Needs assistance Grooming: Needs assistance Feeding: Needs assistance Bathing: Needs assistance Toileting: Needs assistance In/Out Bed: Needs assistance Walks in Home: Needs assistance Does the patient have difficulty walking or climbing stairs?: Yes Weakness of Legs: Both Weakness of Arms/Hands: Both  Permission Sought/Granted Permission sought to share information with : Case Manager Permission granted to share information with : Yes, Verbal Permission Granted  Share Information with NAME: Fannie Knee, RN           Emotional Assessment         Alcohol / Substance Use: Not Applicable Psych Involvement: No (comment)  Admission diagnosis:  Sepsis due to pneumonia (HCC) [J18.9, A41.9] Pneumonia due to infectious organism, unspecified laterality, unspecified part of lung [J18.9] Sepsis, due to unspecified organism, unspecified whether acute organ dysfunction present [facility]) [A41.9] Patient Active Problem List   Diagnosis Date Noted   Sepsis due to pneumonia (HCC) 09/23/2022   Dependence on wheelchair 05/17/2020   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) 05/17/2020   Diaphragmatic hernia 05/17/2020   Other specified postprocedural states 05/17/2020   Rupture of right rotator cuff 05/17/2020   Urge incontinence of urine 05/17/2020   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) 01/19/2018   Left leg cellulitis 01/18/2018   MRSA bacteremia 01/17/2018   MCI (mild cognitive impairment) 10/24/2017   OCD (obsessive compulsive disorder) 10/24/2017   Acute respiratory failure with hypoxia (HCC) 05/06/2017   HCAP (healthcare-associated pneumonia) 05/06/2017   Sepsis (HCC) 05/06/2017   Seizure  (HCC) 05/06/2017   CKD (chronic kidney disease), stage II 05/06/2017   Food impaction of esophagus    Esophageal stricture    Hiatal hernia    Urinary retention 12/10/2012   Rhabdomyolysis 12/10/2012   Falls 12/10/2012   First degree AV block 10/20/2012   Extremity muscle atrophy 10/20/2012   Lumbosacral plexopathy 07/20/2012   Diabetes mellitus without complication (HCC)    Hypertension    Depression    Arthritis    Hyperlipidemia    Cataract    Urinary frequency    Fatty liver    Schizophrenia (HCC)    Peripheral edema    Internal hemorrhoids    Adenomatous colon polyp    Insulin dependent type 2 diabetes mellitus (HCC)    Type II or unspecified type diabetes mellitus with neurological manifestations, not stated as uncontrolled(250.60) 03/20/2012   Abnormality of gait 03/20/2012   Other malaise and fatigue 03/20/2012   PCP:  Renford Dills, MD Pharmacy:   [facility] - [place], [place] - [address] [place] [address] Phone: [phone] Fax: [phone]     Social Determinants of Health (SDOH) Social History: SDOH Screenings   Food Insecurity: Patient Unable To Answer (09/24/2022)  Housing: Low Risk  (09/24/2022)  Transportation Needs: No Transportation Needs (09/24/2022)  Utilities: Not At Risk (09/24/2022)  Tobacco Use: Low Risk  (09/23/2022)   SDOH Interventions:     Readmission Risk Interventions    09/24/2022   10:26 AM  Readmission Risk Prevention Plan  Transportation Screening Complete  PCP or Specialist Appt within 5-7 Days Complete  Home Care Screening Complete  Medication Review (RN CM) Complete

## 2022-09-24 NOTE — Plan of Care (Signed)
  Problem: Activity: Goal: Ability to tolerate increased activity will improve Outcome: Progressing   Problem: Clinical Measurements: Goal: Ability to maintain a body temperature in the normal range will improve Outcome: Progressing   Problem: Respiratory: Goal: Ability to maintain adequate ventilation will improve Outcome: Progressing Goal: Ability to maintain a clear airway will improve Outcome: Progressing   Problem: Education: Goal: Ability to describe self-care measures that may prevent or decrease complications (Diabetes Survival Skills Education) will improve Outcome: Progressing Goal: Individualized Educational Video(s) Outcome: Progressing   Problem: Coping: Goal: Ability to adjust to condition or change in health will improve Outcome: Progressing   Problem: Fluid Volume: Goal: Ability to maintain a balanced intake and output will improve Outcome: Progressing   Problem: Health Behavior/Discharge Planning: Goal: Ability to identify and utilize available resources and services will improve Outcome: Progressing Goal: Ability to manage health-related needs will improve Outcome: Progressing   Problem: Nutritional: Goal: Maintenance of adequate nutrition will improve Outcome: Progressing Goal: Progress toward achieving an optimal weight will improve Outcome: Progressing   Problem: Skin Integrity: Goal: Risk for impaired skin integrity will decrease Outcome: Progressing   Problem: Tissue Perfusion: Goal: Adequacy of tissue perfusion will improve Outcome: Progressing   Problem: Health Behavior/Discharge Planning: Goal: Ability to manage health-related needs will improve Outcome: Progressing   Problem: Clinical Measurements: Goal: Ability to maintain clinical measurements within normal limits will improve Outcome: Progressing Goal: Will remain free from infection Outcome: Progressing Goal: Diagnostic test results will improve Outcome: Progressing Goal:  Respiratory complications will improve Outcome: Progressing Goal: Cardiovascular complication will be avoided Outcome: Progressing   Problem: Activity: Goal: Risk for activity intolerance will decrease Outcome: Progressing   Problem: Nutrition: Goal: Adequate nutrition will be maintained Outcome: Progressing   Problem: Coping: Goal: Level of anxiety will decrease Outcome: Progressing   Problem: Elimination: Goal: Will not experience complications related to bowel motility Outcome: Progressing Goal: Will not experience complications related to urinary retention Outcome: Progressing   Problem: Pain Managment: Goal: General experience of comfort will improve Outcome: Progressing   Problem: Safety: Goal: Ability to remain free from injury will improve Outcome: Progressing   Problem: Skin Integrity: Goal: Risk for impaired skin integrity will decrease Outcome: Progressing   Problem: Metabolic: Goal: Ability to maintain appropriate glucose levels will improve Outcome: Not Progressing   Problem: Education: Goal: Knowledge of General Education information will improve Description: Including pain rating scale, medication(s)/side effects and non-pharmacologic comfort measures Outcome: Not Progressing  Pt is very forgetful.

## 2022-09-25 DIAGNOSIS — J189 Pneumonia, unspecified organism: Secondary | ICD-10-CM | POA: Diagnosis not present

## 2022-09-25 DIAGNOSIS — A419 Sepsis, unspecified organism: Secondary | ICD-10-CM | POA: Diagnosis not present

## 2022-09-25 LAB — CBC
HCT: 33.7 % — ABNORMAL LOW (ref 39.0–52.0)
Hemoglobin: 10.4 g/dL — ABNORMAL LOW (ref 13.0–17.0)
MCH: 29.9 pg (ref 26.0–34.0)
MCHC: 30.9 g/dL (ref 30.0–36.0)
MCV: 96.8 fL (ref 80.0–100.0)
Platelets: 208 10*3/uL (ref 150–400)
RBC: 3.48 MIL/uL — ABNORMAL LOW (ref 4.22–5.81)
RDW: 15 % (ref 11.5–15.5)
WBC: 13.9 10*3/uL — ABNORMAL HIGH (ref 4.0–10.5)
nRBC: 0 % (ref 0.0–0.2)

## 2022-09-25 LAB — BASIC METABOLIC PANEL
Anion gap: 10 (ref 5–15)
BUN: 19 mg/dL (ref 8–23)
CO2: 27 mmol/L (ref 22–32)
Calcium: 8.5 mg/dL — ABNORMAL LOW (ref 8.9–10.3)
Chloride: 101 mmol/L (ref 98–111)
Creatinine, Ser: 1.05 mg/dL (ref 0.61–1.24)
GFR, Estimated: >60 mL/min (ref >60)
Glucose, Bld: 131 mg/dL — ABNORMAL HIGH (ref 70–99)
Potassium: 3.9 mmol/L (ref 3.5–5.1)
Sodium: 138 mmol/L (ref 135–145)

## 2022-09-25 LAB — GLUCOSE, CAPILLARY
Glucose-Capillary: 125 mg/dL — ABNORMAL HIGH (ref 70–99)
Glucose-Capillary: 217 mg/dL — ABNORMAL HIGH (ref 70–99)
Glucose-Capillary: 149 mg/dL — ABNORMAL HIGH (ref 70–99)
Glucose-Capillary: 172 mg/dL — ABNORMAL HIGH (ref 70–99)

## 2022-09-25 LAB — PROCALCITONIN: Procalcitonin: 0.25 ng/mL

## 2022-09-25 LAB — MRSA NEXT GEN BY PCR, NASAL: MRSA by PCR Next Gen: DETECTED — AB

## 2022-09-25 MED ORDER — GERHARDT'S BUTT CREAM
TOPICAL_CREAM | Freq: Three times a day (TID) | CUTANEOUS | Status: DC
  Filled 2022-09-25: qty 1

## 2022-09-25 MED ORDER — MUPIROCIN 2 % EX OINT
1.0000 | TOPICAL_OINTMENT | Freq: Two times a day (BID) | CUTANEOUS | Status: DC
  Administered 2022-09-25 – 2022-09-27 (×4): 1 via NASAL
  Filled 2022-09-25 (×3): qty 22

## 2022-09-25 MED ORDER — CHLORHEXIDINE GLUCONATE CLOTH 2 % EX PADS
6.0000 | MEDICATED_PAD | Freq: Every day | CUTANEOUS | Status: DC
  Administered 2022-09-26 – 2022-09-27 (×2): 6 via TOPICAL

## 2022-09-25 MED ORDER — ALBUTEROL SULFATE (2.5 MG/3ML) 0.083% IN NEBU
2.5000 mg | INHALATION_SOLUTION | Freq: Four times a day (QID) | RESPIRATORY_TRACT | Status: DC | PRN
  Administered 2022-09-25: 2.5 mg via RESPIRATORY_TRACT
  Filled 2022-09-25: qty 3

## 2022-09-25 NOTE — TOC Progression Note (Signed)
Transition of Care (TOC) - Progression Note    Patient Details  Name: Ricky [name]. MRN: [id number] Date of Birth: 07/11/1940  Transition of Care [facility]) CM/SW Contact  Raekwan Spelman, Olegario Messier, RN Phone Number: 09/25/2022, 4:36 PM  Clinical Narrative:  From [name] for return.        Barriers to Discharge: Continued Medical Work up  Expected Discharge Plan and Services       Living arrangements for the past 2 months: Skilled Nursing Facility                                       Social Determinants of Health (SDOH) Interventions SDOH Screenings   Food Insecurity: Patient Unable To Answer (09/24/2022)  Housing: Low Risk  (09/24/2022)  Transportation Needs: No Transportation Needs (09/24/2022)  Utilities: Not At Risk (09/24/2022)  Tobacco Use: Low Risk  (09/23/2022)    Readmission Risk Interventions    09/24/2022   10:26 AM  Readmission Risk Prevention Plan  Transportation Screening Complete  PCP or Specialist Appt within 5-7 Days Complete  Home Care Screening Complete  Medication Review (RN CM) Complete

## 2022-09-25 NOTE — Progress Notes (Signed)
PROGRESS NOTE  Ricky [name].  DOB: 1940-08-03  PCP: Renford Dills, MD YQM:[id number]  DOA: 09/23/2022  LOS: 2 days  Hospital Day: 3  Brief narrative: Ricky Lines. is a 82 y.o. male with PMH significant for dementia, schizophrenia, DM2, HTN, HLD, CHF, fatty liver who is a long-term resident at [facility] nursing facility 9/2, patient was sent to the ED with concern of UTI and shortness of breath. At the facility, patient was noted to have labored breathing, O2 sat was noted to be in 80s on room air and was started on supplemental oxygen.  Patient also had a strong urine order.  In the ED, initial blood pressure was low at 96/50 which improved with IV fluid. He had a fever of 101, tachycardic to 100s, tachypneic to 20s and required 4 L oxygen by nasal cannula Labs with WBC count 17.1, hemoglobin 11.8, sodium 133, glucose 271, lactic acid 2.1 Urinalysis with hazy yellow urine with small amount of hemoglobin, large leukocytes, many bacteria Chest x-ray showed by basilar opacities L>R, small left pleural effusion, cardiomegaly with pulmonary venous congestion EKG with A-fib at 110 bpm, QTc 441 ms Patient was started on IV Rocephin, azithromycin Ad[facility] to [facility]  Subjective: Patient was seen and examined this morning.   Sitting up in recliner.  Soft but able to have a conversation.   No family at bedside No report of diarrhea per RN  Assessment and plan: Sepsis POA Bibasilar pneumonia Chronic respiratory failure with hypoxia Sent from nursing home with labored breathing, hypoxia Met sepsis parameters with fever, leukocytosis, lactic acidosis, elevated procalcitonin level Chest x-ray with bibasilar infiltrates He has been started on IV Rocephin, azithromycin (QTc 441 ms) Continue bronchodilators, antitussives as needed, breathing exercises, incentive spirometry Currently on supplemental oxygen 4 L by nasal cannula.  Unclear if he is chronically on oxygen  monitor  temperature and WBC trend.  Lactic acid level normalized Recent Labs  Lab 09/23/22 1730 09/23/22 1743 09/23/22 1942 09/24/22 0404 09/25/22 0354  WBC 17.1*  --   --  14.8* 13.9*  LATICACIDVEN  --  2.1* 1.2  --   --   PROCALCITON  --   --   --  0.46 0.25   UTI Urinalysis with hazy yellow urine with small amount of hemoglobin, large leukocytes, many bacteria  Urine culture is growing only 10,000 CFU per mL of Proteus.  No growth in blood cultures so far   Currently on IV antibiotics  Hyponatremia Sodium level improving Recent Labs  Lab 09/23/22 1730 09/24/22 0404 09/25/22 0354  NA 133* 137 138   Type 2 diabetes mellitus with hyperglycemia A1c 8.8 on 09/24/2022 PTA meds- Lantus 22 units twice daily, Humalog Premeal 3 times daily, Jardiance 25 mg daily Currently on Semglee 10 units nightly and SSI/Accu-Cheks.  May need further adjustment. Recent Labs  Lab 09/24/22 1145 09/24/22 1636 09/24/22 2029 09/25/22 0804 09/25/22 1150  GLUCAP 207* 119* 272* 125* 217*   Hypotension H/o HTN, chronic diastolic CHF  Blood pressure initially low in 90s due to sepsis.  Improved with IV hydration. CHF remains compensated  PTA meds-Lasix 10 mg daily,, amlodipine 2.5 mg daily  Currently on hold.  Continue to monitor.  HLD Pravastatin  GERD PPI  BPH Flomax  Dementia  depression, anxiety, insomnia Continue fluvoxamine, Zyprexa, and Ambien Use delirium precautions     Mobility: PT eval ordered  Goals of care   Code Status: Limited: Do not attempt resuscitation (DNR) -DNR-LIMITED -Do Not Intubate/DNI  DVT prophylaxis:  enoxaparin (LOVENOX) injection 40 mg Start: 09/23/22 2200   Antimicrobials: IV Rocephin, azithromycin Fluid: Okay to stop IV fluid Consultants: None Family Communication: None at bedside  Status: Inpatient  Needs to continue in-hospital care: Pending culture reports, continue IV antibiotics Anticipated d/c to: Back to SNF likely hopefully  tomorrow.   Diet:  Diet Order             Diet regular Room service appropriate? Yes; Fluid consistency: Thin  Diet effective now                   Scheduled Meds:  enoxaparin (LOVENOX) injection  40 mg Subcutaneous Q24H   fluvoxaMINE  100 mg Oral QHS   insulin aspart  0-5 Units Subcutaneous QHS   insulin aspart  0-6 Units Subcutaneous TID WC   insulin glargine-yfgn  10 Units Subcutaneous QHS   OLANZapine  25 mg Oral Daily   pantoprazole  40 mg Oral Daily   pravastatin  40 mg Oral QPC lunch   sodium chloride flush  3 mL Intravenous Q12H   tamsulosin  0.4 mg Oral Daily   zolpidem  5 mg Oral QHS    PRN meds: acetaminophen **OR** acetaminophen, guaiFENesin, ondansetron **OR** ondansetron (ZOFRAN) IV, mouth rinse, senna-docusate   Infusions:   azithromycin Stopped (09/24/22 1819)   cefTRIAXone (ROCEPHIN)  IV Stopped (09/24/22 1718)    Antimicrobials: Anti-infectives (From admission, onward)    Start     Dose/Rate Route Frequency Ordered Stop   09/23/22 1715  cefTRIAXone (ROCEPHIN) 2 g in sodium chloride 0.9 % 100 mL IVPB        2 g 200 mL/hr over 30 Minutes Intravenous Every 24 hours 09/23/22 1703 09/28/22 1714   09/23/22 1715  azithromycin (ZITHROMAX) 500 mg in sodium chloride 0.9 % 250 mL IVPB        500 mg 250 mL/hr over 60 Minutes Intravenous Every 24 hours 09/23/22 1703 09/28/22 1714       Nutritional status:  Body mass index is 32.04 kg/m.          Objective: Vitals:   09/24/22 2028 09/25/22 0451  BP: (!) 107/58 128/74  Pulse: 86 90  Resp: (!) 21 20  Temp: 99.1 F (37.3 C) 98.1 F (36.7 C)  SpO2: 94% 93%    Intake/Output Summary (Last 24 hours) at 09/25/2022 1326 Last data filed at 09/25/2022 1056 Gross per 24 hour  Intake 2928.08 ml  Output 3400 ml  Net -471.92 ml   Filed Weights   09/23/22 1715 09/23/22 2355  Weight: 98.4 kg 98.4 kg   Weight change:  Body mass index is 32.04 kg/m.   Physical Exam: General exam: Elderly Caucasian  male.  Not in pain.  On supplemental oxygen Skin: No rashes, lesions or ulcers. HEENT: Atraumatic, normocephalic, no obvious bleeding Lungs: Shallow respiratory effort.  Otherwise clear to auscultation CVS: Regular rate and rhythm, no murmur GI/Abd soft, nontender, nondistended, bowel sound present CNS: Mental status improving.  Able to have a conversation today. Psychiatry: Mood appropriate Extremities: No pedal edema, no calf tenderness  Data Review: I have personally reviewed the laboratory data and studies available.  F/u labs ordered Unresulted Labs (From admission, onward)     Start     Ordered   09/30/22 0500  Creatinine, serum  (enoxaparin (LOVENOX)    CrCl >/= 30 ml/min)  Weekly,   R     Comments: while on enoxaparin therapy    09/23/22 2156  09/24/22 0500  Basic metabolic panel  Daily,   R      09/23/22 2156   09/24/22 0500  CBC  Daily,   R      09/23/22 2156   09/23/22 2156  Legionella Pneumophila Serogp 1 Ur Ag  (COPD / Pneumonia / Cellulitis / Lower Extremity Wound)  Once,   R        09/23/22 2156            Total time spent in review of labs and imaging, patient evaluation, formulation of plan, documentation and communication with family: 45 minutes  Signed, Lorin Glass, MD [facility] 09/25/2022

## 2022-09-25 NOTE — [place] (Signed)
[place] MEDICAID [place]2 LEVEL OF CARE FORM     IDENTIFICATION  Patient Name: Ricky [name]. Birthdate: [birth year] Sex: male Admission Date (Current Location): 09/23/2022  [facility] and [place] Number:  Producer, television/film/video and Address:  [facility],  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) 05/17/2020   Diaphragmatic hernia 05/17/2020   Other specified postprocedural states 05/17/2020   Rupture of right rotator cuff 05/17/2020   Urge incontinence of urine 05/17/2020   Stage III pressure ulcer of left buttock (HCC) 01/19/2018   Left leg cellulitis 01/18/2018   MRSA bacteremia 01/17/2018   MCI (mild cognitive impairment) 10/24/2017   OCD (obsessive compulsive disorder) 10/24/2017   Acute respiratory failure with hypoxia (HCC) 05/06/2017   HCAP (healthcare-associated pneumonia) 05/06/2017   Sepsis (HCC) 05/06/2017   Seizure (HCC) 05/06/2017   CKD (chronic kidney disease), stage II 05/06/2017   Food impaction of esophagus    Esophageal stricture    Hiatal hernia    Urinary retention 12/10/2012   Rhabdomyolysis 12/10/2012   Falls 12/10/2012   First degree AV block 10/20/2012   Extremity muscle atrophy 10/20/2012   Lumbosacral plexopathy 07/20/2012   Diabetes mellitus without complication (HCC)    Hypertension    Depression    Arthritis    Hyperlipidemia    Cataract    Urinary  frequency    Fatty liver    Schizophrenia (HCC)    Peripheral edema    Internal hemorrhoids    Adenomatous colon polyp    Insulin dependent type 2 diabetes mellitus (HCC)    Type II or unspecified type diabetes mellitus with neurological manifestations, not stated as uncontrolled(250.60) 03/20/2012   Abnormality of gait 03/20/2012   Other malaise and fatigue 03/20/2012    Orientation RESPIRATION BLADDER Height & Weight     Self  Normal Incontinent Weight: 98.4 kg Height:  5\' 9"  (175.3 cm)  BEHAVIORAL SYMPTOMS/MOOD NEUROLOGICAL BOWEL NUTRITION STATUS      Incontinent Diet (see d/c summary)  AMBULATORY STATUS COMMUNICATION OF NEEDS Skin   Total Care Verbally Normal                       Personal Care Assistance Level of Assistance  Bathing, Feeding, Dressing, Total care Bathing Assistance: Maximum assistance Feeding assistance: Limited assistance Dressing Assistance: Maximum assistance Total Care Assistance: Maximum assistance   Functional Limitations Info  Sight, Hearing, Speech Sight Info: Adequate Hearing Info: Adequate Speech Info: Adequate    SPECIAL CARE FACTORS FREQUENCY                       Contractures Contractures Info: Not present    Additional Factors Info  Code Status, Allergies Code Status Info: DNR Allergies Info: Asa (Aspirin), Codeine, Tylenol With Codeine #3 (Acetaminophen-codeine), Tylenol (Acetaminophen)           Current Medications (09/25/2022):  This  is the current hospital active medication list Current Facility-Administered Medications  Medication Dose Route Frequency Provider Last Rate Last Admin   acetaminophen (TYLENOL) tablet 650 mg  650 mg Oral Q6H PRN Opyd, Lavone Neri, MD       Or   acetaminophen (TYLENOL) suppository 650 mg  650 mg Rectal Q6H PRN Opyd, Lavone Neri, MD       azithromycin (ZITHROMAX) 500 mg in sodium chloride 0.9 % 250 mL IVPB  500 mg Intravenous Q24H Opyd, Lavone Neri, MD 250 mL/hr at 09/25/22 1618 500 mg at  09/25/22 1618   cefTRIAXone (ROCEPHIN) 2 g in sodium chloride 0.9 % 100 mL IVPB  2 g Intravenous Q24H Opyd, Lavone Neri, MD 200 mL/hr at 09/25/22 1518 2 g at 09/25/22 1518   enoxaparin (LOVENOX) injection 40 mg  40 mg Subcutaneous Q24H Opyd, Lavone Neri, MD   40 mg at 09/24/22 2150   fluvoxaMINE (LUVOX) tablet 100 mg  100 mg Oral QHS Opyd, Lavone Neri, MD   100 mg at 09/24/22 2151   guaiFENesin (ROBITUSSIN) 100 MG/5ML liquid 5 mL  5 mL Oral Q4H PRN Opyd, Lavone Neri, MD       insulin aspart (novoLOG) injection 0-5 Units  0-5 Units Subcutaneous QHS Briscoe Deutscher, MD   3 Units at 09/24/22 2152   insulin aspart (novoLOG) injection 0-6 Units  0-6 Units Subcutaneous TID WC Opyd, Lavone Neri, MD   2 Units at 09/25/22 1231   insulin glargine-yfgn (SEMGLEE) injection 10 Units  10 Units Subcutaneous QHS Opyd, Lavone Neri, MD   10 Units at 09/24/22 2150   OLANZapine (ZYPREXA) tablet 25 mg  25 mg Oral Daily Opyd, Lavone Neri, MD   25 mg at 09/25/22 0842   ondansetron (ZOFRAN) tablet 4 mg  4 mg Oral Q6H PRN Opyd, Lavone Neri, MD       Or   ondansetron (ZOFRAN) injection 4 mg  4 mg Intravenous Q6H PRN Opyd, Lavone Neri, MD       Oral care mouth rinse  15 mL Mouth Rinse PRN Opyd, Lavone Neri, MD       pantoprazole (PROTONIX) EC tablet 40 mg  40 mg Oral Daily Opyd, Lavone Neri, MD   40 mg at 09/25/22 8295   pravastatin (PRAVACHOL) tablet 40 mg  40 mg Oral QPC lunch Opyd, Lavone Neri, MD   40 mg at 09/25/22 1231   senna-docusate (Senokot-S) tablet 1 tablet  1 tablet Oral QHS PRN Opyd, Lavone Neri, MD       sodium chloride flush (NS) 0.9 % injection 3 mL  3 mL Intravenous Q12H Opyd, Lavone Neri, MD   3 mL at 09/24/22 2150   tamsulosin (FLOMAX) capsule 0.4 mg  0.4 mg Oral Daily Opyd, Lavone Neri, MD   0.4 mg at 09/25/22 0843   zolpidem (AMBIEN) tablet 5 mg  5 mg Oral QHS Opyd, Lavone Neri, MD   5 mg at 09/24/22 2151     Discharge Medications: Please see discharge summary for a list of discharge medications.  Relevant Imaging  Results:  Relevant Lab Results:   Additional Information [id number]  Lanier Clam, RN

## 2022-09-26 ENCOUNTER — Inpatient Hospital Stay (HOSPITAL_COMMUNITY): Payer: Medicare Other

## 2022-09-26 DIAGNOSIS — J189 Pneumonia, unspecified organism: Secondary | ICD-10-CM | POA: Diagnosis not present

## 2022-09-26 DIAGNOSIS — A419 Sepsis, unspecified organism: Secondary | ICD-10-CM | POA: Diagnosis not present

## 2022-09-26 LAB — URINE CULTURE: Culture: 10000 — AB

## 2022-09-26 LAB — CBC
HCT: 35.3 % — ABNORMAL LOW (ref 39.0–52.0)
Hemoglobin: 10.9 g/dL — ABNORMAL LOW (ref 13.0–17.0)
MCH: 30.2 pg (ref 26.0–34.0)
MCHC: 30.9 g/dL (ref 30.0–36.0)
MCV: 97.8 fL (ref 80.0–100.0)
Platelets: 215 10*3/uL (ref 150–400)
RBC: 3.61 MIL/uL — ABNORMAL LOW (ref 4.22–5.81)
RDW: 14.6 % (ref 11.5–15.5)
WBC: 13.6 10*3/uL — ABNORMAL HIGH (ref 4.0–10.5)
nRBC: 0 % (ref 0.0–0.2)

## 2022-09-26 LAB — GLUCOSE, CAPILLARY
Glucose-Capillary: 156 mg/dL — ABNORMAL HIGH (ref 70–99)
Glucose-Capillary: 275 mg/dL — ABNORMAL HIGH (ref 70–99)
Glucose-Capillary: 110 mg/dL — ABNORMAL HIGH (ref 70–99)
Glucose-Capillary: 223 mg/dL — ABNORMAL HIGH (ref 70–99)

## 2022-09-26 LAB — BASIC METABOLIC PANEL
Anion gap: 11 (ref 5–15)
BUN: 17 mg/dL (ref 8–23)
CO2: 25 mmol/L (ref 22–32)
Calcium: 8.6 mg/dL — ABNORMAL LOW (ref 8.9–10.3)
Chloride: 100 mmol/L (ref 98–111)
Creatinine, Ser: 0.97 mg/dL (ref 0.61–1.24)
GFR, Estimated: >60 mL/min (ref >60)
Glucose, Bld: 186 mg/dL — ABNORMAL HIGH (ref 70–99)
Potassium: 3.8 mmol/L (ref 3.5–5.1)
Sodium: 136 mmol/L (ref 135–145)

## 2022-09-26 LAB — LEGIONELLA PNEUMOPHILA SEROGP 1 UR AG: L. pneumophila Serogp 1 Ur Ag: NEGATIVE

## 2022-09-26 LAB — BRAIN NATRIURETIC PEPTIDE: B Natriuretic Peptide: 162.5 pg/mL — ABNORMAL HIGH (ref 0.0–100.0)

## 2022-09-26 MED ORDER — CEFDINIR 300 MG PO CAPS: 300.0000 mg | ORAL_CAPSULE | Freq: Two times a day (BID) | ORAL | Status: AC

## 2022-09-26 MED ORDER — INSULIN GLARGINE 100 UNIT/ML SOLOSTAR PEN: 15.0000 [IU] | PEN_INJECTOR | Freq: Every day | SUBCUTANEOUS | Status: AC

## 2022-09-26 MED ORDER — GUAIFENESIN 100 MG/5ML PO LIQD: 5.0000 mL | ORAL | Status: AC | PRN

## 2022-09-26 MED ORDER — SACCHAROMYCES BOULARDII 250 MG PO CAPS: 250.0000 mg | ORAL_CAPSULE | Freq: Two times a day (BID) | ORAL | Status: AC

## 2022-09-26 MED ORDER — SENNOSIDES-DOCUSATE SODIUM 8.6-50 MG PO TABS: 1.0000 | ORAL_TABLET | Freq: Every evening | ORAL | Status: AC | PRN

## 2022-09-26 MED ORDER — FUROSEMIDE 10 MG/ML IJ SOLN
10.0000 mg | Freq: Once | INTRAMUSCULAR | Status: AC
  Administered 2022-09-26: 10 mg via INTRAVENOUS
  Filled 2022-09-26: qty 2

## 2022-09-26 MED ORDER — CEFDINIR 300 MG PO CAPS
300.0000 mg | ORAL_CAPSULE | Freq: Two times a day (BID) | ORAL | Status: DC
  Administered 2022-09-26 – 2022-09-27 (×2): 300 mg via ORAL
  Filled 2022-09-26 (×2): qty 1

## 2022-09-26 NOTE — Plan of Care (Signed)
  Problem: Activity: Goal: Ability to tolerate increased activity will improve Outcome: Adequate for Discharge   Problem: Clinical Measurements: Goal: Ability to maintain a body temperature in the normal range will improve Outcome: Adequate for Discharge   Problem: Respiratory: Goal: Ability to maintain adequate ventilation will improve Outcome: Adequate for Discharge Goal: Ability to maintain a clear airway will improve Outcome: Adequate for Discharge   Problem: Education: Goal: Ability to describe self-care measures that may prevent or decrease complications (Diabetes Survival Skills Education) will improve Outcome: Adequate for Discharge Goal: Individualized Educational Video(s) Outcome: Adequate for Discharge   Problem: Coping: Goal: Ability to adjust to condition or change in health will improve Outcome: Adequate for Discharge   Problem: Fluid Volume: Goal: Ability to maintain a balanced intake and output will improve Outcome: Adequate for Discharge   Problem: Health Behavior/Discharge Planning: Goal: Ability to identify and utilize available resources and services will improve Outcome: Adequate for Discharge Goal: Ability to manage health-related needs will improve Outcome: Adequate for Discharge   Problem: Metabolic: Goal: Ability to maintain appropriate glucose levels will improve Outcome: Adequate for Discharge   Problem: Nutritional: Goal: Maintenance of adequate nutrition will improve Outcome: Adequate for Discharge Goal: Progress toward achieving an optimal weight will improve Outcome: Adequate for Discharge   Problem: Skin Integrity: Goal: Risk for impaired skin integrity will decrease Outcome: Adequate for Discharge   Problem: Tissue Perfusion: Goal: Adequacy of tissue perfusion will improve Outcome: Adequate for Discharge   Problem: Education: Goal: Knowledge of General Education information will improve Description: Including pain rating scale,  medication(s)/side effects and non-pharmacologic comfort measures Outcome: Adequate for Discharge   Problem: Health Behavior/Discharge Planning: Goal: Ability to manage health-related needs will improve Outcome: Adequate for Discharge   Problem: Clinical Measurements: Goal: Ability to maintain clinical measurements within normal limits will improve Outcome: Adequate for Discharge Goal: Will remain free from infection Outcome: Adequate for Discharge Goal: Diagnostic test results will improve Outcome: Adequate for Discharge Goal: Respiratory complications will improve Outcome: Adequate for Discharge Goal: Cardiovascular complication will be avoided Outcome: Adequate for Discharge   Problem: Activity: Goal: Risk for activity intolerance will decrease Outcome: Adequate for Discharge   Problem: Nutrition: Goal: Adequate nutrition will be maintained Outcome: Adequate for Discharge   Problem: Coping: Goal: Level of anxiety will decrease Outcome: Adequate for Discharge   Problem: Elimination: Goal: Will not experience complications related to bowel motility Outcome: Adequate for Discharge Goal: Will not experience complications related to urinary retention Outcome: Adequate for Discharge   Problem: Pain Managment: Goal: General experience of comfort will improve Outcome: Adequate for Discharge   Problem: Safety: Goal: Ability to remain free from injury will improve Outcome: Adequate for Discharge   Problem: Skin Integrity: Goal: Risk for impaired skin integrity will decrease Outcome: Adequate for Discharge   

## 2022-09-26 NOTE — Plan of Care (Signed)
  Problem: Nutrition: Goal: Adequate nutrition will be maintained Outcome: Progressing   Problem: Coping: Goal: Level of anxiety will decrease Outcome: Progressing   Problem: Pain Managment: Goal: General experience of comfort will improve Outcome: Progressing   

## 2022-09-26 NOTE — Discharge Summary (Addendum)
Physician Discharge Summary  Ricky [name]. VHQ:[id number] DOB: 1940/03/29 DOA: 09/23/2022  PCP: Renford Dills, MD  Admit date: 09/23/2022 Discharge date: 09/27/2022  [facility] Discharge disposition: Back to the same  Recommendations at discharge:  Complete the course of antibiotics with probiotics for next 5 days Supplemental oxygen as needed.  Wean down as tolerated Insulin regimen has been modified to keep blood sugar level in range   Brief narrative: Ricky [name]. is a 82 y.o. male with PMH significant for dementia, schizophrenia, DM2, HTN, HLD, CHF, fatty liver who is a long-term resident at [facility] nursing facility 9/2, patient was sent to the ED with concern of UTI and shortness of breath. At the facility, patient was noted to have labored breathing, O2 sat was noted to be in 80s on room air and was started on supplemental oxygen.  Patient also had a strong urine order.  In the ED, initial blood pressure was low at 96/50 which improved with IV fluid. He had a fever of 101, tachycardic to 100s, tachypneic to 20s and required 4 L oxygen by nasal cannula Labs with WBC count 17.1, hemoglobin 11.8, sodium 133, glucose 271, lactic acid 2.1 Urinalysis with hazy yellow urine with small amount of hemoglobin, large leukocytes, many bacteria Chest x-ray showed by basilar opacities L>R, small left pleural effusion, cardiomegaly with pulmonary venous congestion EKG with A-fib at 110 bpm, QTc 441 ms Patient was started on IV Rocephin, azithromycin Admitted to [facility]  Subjective: Patient was seen and examined this morning.   Sitting up in recliner.  Soft but able to have a conversation.   No family at bedside No report of diarrhea per [facility] course: Sepsis POA Bibasilar pneumonia Chronic respiratory failure with hypoxia Sent from nursing home with labored breathing, hypoxia Met sepsis parameters with fever, leukocytosis, lactic  acidosis, elevated procalcitonin level Chest x-ray with bibasilar infiltrates He was started on IV Rocephin, azithromycin (QTc 441 ms) Respiratory status improving. Remains on his baseline requirement of 3 to 4 L supplemental oxygen. Continue bronchodilators, antitussives as needed, breathing exercises, incentive spirometry No fever.  WC count improving.  Lactic acid level normalized. Patient wants to go back to [place] today.  Will discharge him on 5 more days of cefadroxil with probiotics Recent Labs  Lab 09/23/22 1730 09/23/22 1743 09/23/22 1942 09/24/22 0404 09/25/22 0354 09/26/22 0128  WBC 17.1*  --   --  14.8* 13.9* 13.6*  LATICACIDVEN  --  2.1* 1.2  --   --   --   PROCALCITON  --   --   --  0.46 0.25  --    UTI Urinalysis with hazy yellow urine with small amount of hemoglobin, large leukocytes, many bacteria  Urine culture is growing only 10,000 CFU per mL of Proteus.  No growth in blood cultures so far   Antibiotic plan as above.  Hyponatremia Sodium level improving Recent Labs  Lab 09/23/22 1730 09/24/22 0404 09/25/22 0354 09/26/22 0128  NA 133* 137 138 136   Type 2 diabetes mellitus with hyperglycemia A1c 8.8 on 09/24/2022 PTA meds- Lantus 22 units twice daily, Humalog Premeal 3 times daily, Jardiance 25 mg daily He has been requiring less insulin here in the hospital. Currently on Semglee 10 units nightly and SSI/Accu-Cheks. I would discharge him on modified regimen. Recent Labs  Lab 09/26/22 1207 09/26/22 1742 09/26/22 2040 09/27/22 0732 09/27/22 1106  GLUCAP 275* 110* 223* 128* 217*   Hypotension H/o  HTN, chronic diastolic CHF  Blood pressure initially low in 90s due to sepsis.  Improved with IV hydration. CHF remains compensated  PTA meds-Lasix 10 mg daily,, amlodipine 2.5 mg daily  Resume the same at discharge.  HLD Pravastatin  GERD PPI  BPH Flomax  Dementia  depression, anxiety, insomnia Continue fluvoxamine, Zyprexa, and  Ambien Use delirium precautions     Goals of care   Code Status: Limited: Do not attempt resuscitation (DNR) -DNR-LIMITED -Do Not Intubate/DNI    Wounds:  -    Discharge Exam:   Vitals:   09/26/22 0437 09/26/22 1308 09/26/22 2043 09/27/22 0342  BP: 136/73 133/79 132/71 (!) 153/88  Pulse: 85 94 92 93  Resp: (!) 21 20 20 18   Temp: 97.9 F (36.6 C) 98.9 F (37.2 C) 98.8 F (37.1 C) 98.2 F (36.8 C)  TempSrc: Oral Oral  Oral  SpO2: 94% 93% 93% 93%  Weight: 98.9 kg     Height:        Body mass index is 32.2 kg/m.  General exam: Elderly Caucasian male.  Not in pain.  On supplemental oxygen Skin: No rashes, lesions or ulcers. HEENT: Atraumatic, normocephalic, no obvious bleeding Lungs: Shallow respiratory effort.  Otherwise clear to auscultation CVS: Regular rate and rhythm, no murmur GI/Abd soft, nontender, nondistended, bowel sound present CNS: Mental status back to normal.  Able to have a conversation Psychiatry: Mood appropriate Extremities: No pedal edema, no calf tenderness  Follow ups:    Contact information for follow-up providers     Renford Dills, MD Follow up.   Specialty: Internal Medicine Contact information: [address] [place] [address] [phone]              Contact information for after-discharge care     Destination     [facility] Preferred SNF .   Service: Skilled Nursing Contact information: [address] [place] [address] [phone]                     Discharge Instructions:   Discharge Instructions     Call MD for:  difficulty breathing, headache or visual disturbances   Complete by: As directed    Call MD for:  extreme fatigue   Complete by: As directed    Call MD for:  hives   Complete by: As directed    Call MD for:  persistant dizziness or light-headedness   Complete by: As directed    Call MD for:  persistant nausea and  vomiting   Complete by: As directed    Call MD for:  severe uncontrolled pain   Complete by: As directed    Call MD for:  temperature >100.4   Complete by: As directed    Diet general   Complete by: As directed    Discharge instructions   Complete by: As directed    Recommendations at discharge:   Complete the course of antibiotics with probiotics for next 5 days  Supplemental oxygen as needed.  Wean down as tolerated  Insulin regimen has been modified to keep blood sugar level in range  General discharge instructions: Follow with Primary MD Renford Dills, MD in 7 days  Please request your PCP  to go over your hospital tests, procedures, radiology results at the follow up. Please get your medicines reviewed and adjusted.  Your PCP may decide to repeat certain labs or tests as needed. Do not drive, operate heavy machinery, perform  activities at heights, swimming or participation in water activities or provide baby sitting services if your were admitted for syncope or siezures until you have seen by Primary MD or a Neurologist and advised to do so again. [place] Controlled Substance Reporting System database was reviewed. Do not drive, operate heavy machinery, perform activities at heights, swim, participate in water activities or provide baby-sitting services while on medications for pain, sleep and mood until your outpatient physician has reevaluated you and advised to do so again.  You are strongly recommended to comply with the dose, frequency and duration of prescribed medications. Activity: As tolerated with Full fall precautions use walker/cane & assistance as needed Avoid using any recreational substances like cigarette, tobacco, alcohol, or non-prescribed drug. If you experience worsening of your admission symptoms, develop shortness of breath, life threatening emergency, suicidal or homicidal thoughts you must seek medical attention immediately by calling 911 or calling your  MD immediately  if symptoms less severe. You must read complete instructions/literature along with all the possible adverse reactions/side effects for all the medicines you take and that have been prescribed to you. Take any new medicine only after you have completely understood and accepted all the possible adverse reactions/side effects.  Wear Seat belts while driving. You were cared for by a hospitalist during your hospital stay. If you have any questions about your discharge medications or the care you received while you were in the hospital after you are discharged, you can call the unit and ask to speak with the hospitalist or the covering physician. Once you are discharged, your primary care physician will handle any further medical issues. Please note that NO REFILLS for any discharge medications will be authorized once you are discharged, as it is imperative that you return to your primary care physician (or establish a relationship with a primary care physician if you do not have one).   Increase activity slowly   Complete by: As directed        Discharge Medications:   Allergies as of 09/27/2022       Reactions   Asa [aspirin]    "Dr told me not to take it"   Codeine Other (See Comments)   DIZZINESS with Tylenol 3   Tylenol With Codeine #3 [acetaminophen-codeine]    Other reaction(s): Unknown   Tylenol [acetaminophen] Other (See Comments)   Dizziness with tylenol 3        Medication List     STOP taking these medications    insulin detemir 100 UNIT/ML injection Commonly known as: LEVEMIR   traMADol 50 MG tablet Commonly known as: ULTRAM       TAKE these medications    amLODipine 2.5 MG tablet Commonly known as: NORVASC Take 2.5 mg by mouth daily.   cefdinir 300 MG capsule Commonly known as: OMNICEF Take 1 capsule (300 mg total) by mouth 2 (two) times daily for 5 days.   cholecalciferol 25 MCG (1000 UNIT) tablet Commonly known as: VITAMIN D3 Take 1,000  Units by mouth daily.   CITRACAL + D PO Take 1 tablet by mouth daily.   docusate sodium 100 MG capsule Commonly known as: COLACE Take 100 mg by mouth 2 (two) times daily.   fluvoxaMINE 100 MG tablet Commonly known as: LUVOX Take 1 tablet (100 mg total) by mouth at bedtime.   furosemide 20 MG tablet Commonly known as: LASIX Take 10 mg by mouth daily before breakfast.   guaiFENesin 100 MG/5ML liquid Commonly known as: ROBITUSSIN  Take 5 mLs by mouth every 4 (four) hours as needed for cough or to loosen phlegm.   HumaLOG KwikPen 100 UNIT/ML KwikPen Generic drug: insulin lispro Inject 2-11 Units into the skin See admin instructions. Times: 0630 1130 1630  Scale: 101-150: 2 units 151-200: 3 units 201-250: 5 units 251-300: 7 units 301-350: 9 units >350: 11 units   insulin glargine 100 UNIT/ML Solostar Pen Commonly known as: LANTUS Inject 15 Units into the skin daily. What changed:  how much to take when to take this   ipratropium-albuterol 0.5-2.5 (3) MG/3ML Soln Commonly known as: DUONEB Take 3 mLs by nebulization every 8 (eight) hours as needed.   Jardiance 25 MG Tabs tablet Generic drug: empagliflozin Take 25 mg by mouth daily. What changed: Another medication with the same name was removed. Continue taking this medication, and follow the directions you see here.   Myrbetriq 50 MG Tb24 tablet Generic drug: mirabegron ER Take 50 mg by mouth daily.   OLANZapine 5 MG tablet Commonly known as: ZYPREXA Take 5 tablets (25 mg total) by mouth daily.   omeprazole 40 MG capsule Commonly known as: PRILOSEC Take 40 mg by mouth daily.   pravastatin 40 MG tablet Commonly known as: PRAVACHOL Take 40 mg by mouth at bedtime.   saccharomyces boulardii 250 MG capsule Commonly known as: FLORASTOR Take 1 capsule (250 mg total) by mouth 2 (two) times daily for 5 days.   senna-docusate 8.6-50 MG tablet Commonly known as: Senokot-S Take 1 tablet by mouth at bedtime as  needed for mild constipation.   tamsulosin 0.4 MG Caps capsule Commonly known as: FLOMAX Take 0.4 mg by mouth every evening.   THERAGRAN-M PREMIER 50 PLUS PO Take 1 tablet by mouth daily.   zolpidem 5 MG tablet Commonly known as: AMBIEN Take 1 tablet (5 mg total) by mouth at bedtime. What changed: how much to take          The results of significant diagnostics from this hospitalization (including imaging, microbiology, ancillary and laboratory) are listed below for reference.    Procedures and Diagnostic Studies:   DG Chest Port 1 View  Result Date: 09/23/2022 CLINICAL DATA:  Questionable sepsis. EXAM: PORTABLE CHEST 1 VIEW COMPARISON:  01/16/2018 FINDINGS: Mildly degraded exam due to AP portable technique and patient body habitus. Reverse apical lordotic positioning. Mild cardiomegaly. Mild pulmonary venous congestion. Small left pleural effusion. Worsened left and developing medial right lung base airspace disease. IMPRESSION: Cardiomegaly with pulmonary venous congestion. Persistent small left pleural effusion with increased left-greater-than-right base airspace disease. Although this could be atelectasis, especially at the left lung base, given history of sepsis, pneumonia is a concern. Electronically Signed   By: Jeronimo Greaves M.D.   On: 09/23/2022 18:42     Labs:   Basic Metabolic Panel: Recent Labs  Lab 09/23/22 1730 09/24/22 0404 09/25/22 0354 09/26/22 0128  NA 133* 137 138 136  K 3.9 3.8 3.9 3.8  CL 97* 103 101 100  CO2 26 25 27 25   GLUCOSE 271* 136* 131* 186*  BUN 21 20 19 17   CREATININE 1.08 1.08 1.05 0.97  CALCIUM 8.5* 8.3* 8.5* 8.6*   GFR Estimated Creatinine Clearance: 68.1 mL/min (by C-G formula based on SCr of 0.97 mg/dL). Liver Function Tests: Recent Labs  Lab 09/23/22 1730  AST 18  ALT 19  ALKPHOS 93  BILITOT 0.6  PROT 7.3  ALBUMIN 2.9*   No results for input(s): "LIPASE", "AMYLASE" in the last 168 hours. No results  for input(s):  "AMMONIA" in the last 168 hours. Coagulation profile Recent Labs  Lab 09/23/22 1730  INR 1.1    CBC: Recent Labs  Lab 09/23/22 1730 09/24/22 0404 09/25/22 0354 09/26/22 0128  WBC 17.1* 14.8* 13.9* 13.6*  NEUTROABS 15.5*  --   --   --   HGB 11.8* 11.0* 10.4* 10.9*  HCT 37.4* 35.5* 33.7* 35.3*  MCV 93.7 96.2 96.8 97.8  PLT 269 198 208 215   Cardiac Enzymes: No results for input(s): "CKTOTAL", "CKMB", "CKMBINDEX", "TROPONINI" in the last 168 hours. BNP: Invalid input(s): "POCBNP" CBG: Recent Labs  Lab 09/26/22 1207 09/26/22 1742 09/26/22 2040 09/27/22 0732 09/27/22 1106  GLUCAP 275* 110* 223* 128* 217*   D-Dimer No results for input(s): "DDIMER" in the last 72 hours. Hgb A1c No results for input(s): "HGBA1C" in the last 72 hours.  Lipid Profile No results for input(s): "CHOL", "HDL", "LDLCALC", "TRIG", "CHOLHDL", "LDLDIRECT" in the last 72 hours. Thyroid function studies No results for input(s): "TSH", "T4TOTAL", "T3FREE", "THYROIDAB" in the last 72 hours.  Invalid input(s): "FREET3" Anemia work up No results for input(s): "VITAMINB12", "FOLATE", "FERRITIN", "TIBC", "IRON", "RETICCTPCT" in the last 72 hours. Microbiology Recent Results (from the past 240 hour(s))  Blood Culture (routine x 2)     Status: None (Preliminary result)   Collection Time: 09/23/22  5:30 PM   Specimen: BLOOD LEFT FOREARM  Result Value Ref Range Status   Specimen Description   Final    BLOOD LEFT FOREARM Performed at [facility], [address] [place], [place] [address]    Special Requests   Final    BOTTLES DRAWN AEROBIC AND ANAEROBIC Blood Culture results may not be optimal due to an excessive volume of blood received in culture bottles Performed at [facility], [address], [place], [place] [address]    Culture   Final    NO GROWTH 4 DAYS Performed at [facility], [address] [place], [place] [address]    Report Status PENDING   Incomplete  Blood Culture (routine x 2)     Status: None (Preliminary result)   Collection Time: 09/23/22  5:35 PM   Specimen: BLOOD LEFT ARM  Result Value Ref Range Status   Specimen Description   Final    BLOOD LEFT ARM Performed at [facility], [address] [place], [place] [address]    Special Requests   Final    BOTTLES DRAWN AEROBIC AND ANAEROBIC Blood Culture adequate volume Performed at [facility], [address] [place], [place] [address]    Culture   Final    NO GROWTH 4 DAYS Performed at [facility], [address], [place], [place] [address]    Report Status PENDING  Incomplete  Resp panel by RT-PCR (RSV, Flu A&B, Covid) Anterior Nasal Swab     Status: None   Collection Time: 09/23/22  5:49 PM   Specimen: Anterior Nasal Swab  Result Value Ref Range Status   SARS Coronavirus 2 by RT PCR NEGATIVE NEGATIVE Final    Comment: (NOTE) SARS-CoV-2 target nucleic acids are NOT DETECTED.  The SARS-CoV-2 RNA is generally detectable in upper respiratory specimens during the acute phase of infection. The lowest concentration of SARS-CoV-2 viral copies this assay can detect is 138 copies/mL. A negative result does not preclude SARS-Cov-2 infection and should not be used as the sole basis for treatment or other patient management decisions. A negative result  may occur with  improper specimen collection/handling, submission of specimen other than nasopharyngeal swab, presence of viral mutation(s) within the areas targeted by this assay, and inadequate number of viral copies(<138 copies/mL). A negative result must be combined with clinical observations, patient history, and epidemiological information. The expected result is Negative.  Fact Sheet for Patients:  BloggerCourse.com  Fact Sheet for Healthcare Providers:  SeriousBroker.it  This test is no t yet approved or cleared by the [place] FDA and  has been authorized for detection and/or diagnosis of SARS-CoV-2 by FDA under an Emergency Use Authorization (EUA). This EUA will remain  in effect (meaning this test can be used) for the duration of the COVID-19 declaration under Section 564(b)(1) of the Act, 21 U.S.C.section 360bbb-3(b)(1), unless the authorization is terminated  or revoked sooner.       Influenza A by PCR NEGATIVE NEGATIVE Final   Influenza B by PCR NEGATIVE NEGATIVE Final    Comment: (NOTE) The Xpert Xpress SARS-CoV-2/FLU/RSV plus assay is intended as an aid in the diagnosis of influenza from Nasopharyngeal swab specimens and should not be used as a sole basis for treatment. Nasal washings and aspirates are unacceptable for Xpert Xpress SARS-CoV-2/FLU/RSV testing.  Fact Sheet for Patients: BloggerCourse.com  Fact Sheet for Healthcare Providers: SeriousBroker.it  This test is not yet approved or cleared by the [place] FDA and has been authorized for detection and/or diagnosis of SARS-CoV-2 by FDA under an Emergency Use Authorization (EUA). This EUA will remain in effect (meaning this test can be used) for the duration of the COVID-19 declaration under Section 564(b)(1) of the Act, 21 U.S.C. section 360bbb-3(b)(1), unless the authorization is terminated or revoked.     Resp Syncytial Virus by PCR NEGATIVE NEGATIVE Final    Comment: (NOTE) Fact Sheet for Patients: BloggerCourse.com  Fact Sheet for Healthcare Providers: SeriousBroker.it  This test is not yet approved or cleared by the [place] FDA and has been authorized for detection and/or diagnosis of SARS-CoV-2 by FDA under an Emergency Use Authorization (EUA). This EUA will remain in effect (meaning this test can be used) for the duration of the COVID-19 declaration under Section 564(b)(1) of the Act, 21 U.S.C. section  360bbb-3(b)(1), unless the authorization is terminated or revoked.  Performed at [facility], [address], [place], [place] [address]   Urine Culture     Status: Abnormal   Collection Time: 09/23/22  8:40 PM   Specimen: Urine, Random  Result Value Ref Range Status   Specimen Description   Final    URINE, RANDOM Performed at [facility], [address], [place], [place] [address]    Special Requests   Final    NONE Reflexed from [id number] Performed at [facility], [address], [place], [place] [address]    Culture 10,000 COLONIES/mL PROTEUS MIRABILIS (A)  Final   Report Status 09/26/2022 FINAL  Final   Organism ID, Bacteria PROTEUS MIRABILIS (A)  Final      Susceptibility   Proteus mirabilis - MIC*    AMPICILLIN <=2 SENSITIVE Sensitive     CEFAZOLIN <=4 SENSITIVE Sensitive     CEFEPIME <=0.12 SENSITIVE Sensitive     CEFTRIAXONE <=0.25 SENSITIVE Sensitive     CIPROFLOXACIN 0.5 INTERMEDIATE Intermediate     GENTAMICIN <=1 SENSITIVE Sensitive     IMIPENEM 0.5 SENSITIVE Sensitive     NITROFURANTOIN RESISTANT Resistant     TRIMETH/SULFA <=20 SENSITIVE Sensitive     AMPICILLIN/SULBACTAM <=2  SENSITIVE Sensitive     PIP/TAZO <=4 SENSITIVE Sensitive     * 10,000 COLONIES/mL PROTEUS MIRABILIS  MRSA Next Gen by PCR, Nasal     Status: Abnormal   Collection Time: 09/25/22  3:01 PM   Specimen: Nasal Mucosa; Nasal Swab  Result Value Ref Range Status   MRSA by PCR Next Gen DETECTED (A) NOT DETECTED Final    Comment: RESULT CALLED TO, READ BACK BY AND VERIFIED WITH: [facility] RN @ 2047 ON 08/25/2022 BY ABDULHALIM,M (NOTE) The GeneXpert MRSA Assay (FDA approved for NASAL specimens only), is one component of a comprehensive MRSA colonization surveillance program. It is not intended to diagnose MRSA infection nor to guide or monitor treatment for MRSA infections. Test performance is not FDA approved in patients less than 7  years old. Performed at [facility], [address] [place], [place] [address]     Time coordinating discharge: 45 minutes  Signed: Melina Schools Lauri Till  [facility] 09/27/2022, 12:04 PM

## 2022-09-26 NOTE — TOC Progression Note (Addendum)
Transition of Care (TOC) - Progression Note    Patient Details  Name: Ricky [name]. MRN: [id number] Date of Birth: [birth year]  Transition of Care [facility]) CM/SW Contact  Howell Rucks, RN Phone Number: 09/26/2022, 10:34 AM  Clinical Narrative:   PT eval completed, recommendation short term rehab-SNF vs return to LTC at [facility] consulted for SNF placement.  Call to pt's niece Ricky [name]) agreeable to short term rehab at [facility] with transition back to LTC at [facility]. Call to [place], admissions liaison with [facility]. Informed of PT recommendation, states she will check and call me back. TOC will continue to follow.   -2:19pm [facility] request for short term rehab-SNF initiated via [employer]. Auth ID# [id number], await authorization.     Barriers to Discharge: Continued Medical Work up  Expected Discharge Plan and Services       Living arrangements for the past 2 months: Skilled Nursing Facility                                       Social Determinants of Health (SDOH) Interventions SDOH Screenings   Food Insecurity: Patient Unable To Answer (09/24/2022)  Housing: Low Risk  (09/24/2022)  Transportation Needs: No Transportation Needs (09/24/2022)  Utilities: Not At Risk (09/24/2022)  Tobacco Use: Low Risk  (09/23/2022)    Readmission Risk Interventions    09/24/2022   10:26 AM  Readmission Risk Prevention Plan  Transportation Screening Complete  PCP or Specialist Appt within 5-7 Days Complete  Home Care Screening Complete  Medication Review (RN CM) Complete

## 2022-09-26 NOTE — Progress Notes (Signed)
    Patient Name: Ricky [name].           DOB: Dec 11, 1940  MRN: [id number]      Admission Date: 09/23/2022  Attending Provider: Lorin Glass, MD  Primary Diagnosis: Sepsis due to pneumonia [facility])   Level of care: Progressive    CROSS COVER NOTE   Date of Service   09/26/2022   [name], 82 y.o. male, was admitted on 09/23/2022 for Sepsis due to pneumonia [facility]).    HPI/Events of Note   Bibasilar pneumonia Chronic respiratory failure with hypoxia Nursing staff reports wheezing, cough, and congestion.  Now requiring 4 L .  Will add on as needed breathing treatment. Has Hx of chronic diastolic CHF.  On 10 mg Lasix at home.  Lasix was held given soft BP related to sepsis on admission.  Currently not on IV fluid.  BP now stable.    Addendum 5366- Chest x-ray-mild cardiomegaly with vascular congestion and bibasilar opacities.  Edema versus infection. BNP 162.5 Will restart 10 mg Lasix dose.   Interventions/ Plan   Neb treatment Chest x-ray BNP IV Lasix, 10 mg        Anthoney Harada, DNP, [facility] [place]

## 2022-09-26 NOTE — Plan of Care (Signed)
  Problem: Respiratory: Goal: Ability to maintain adequate ventilation will improve Outcome: Progressing   Problem: Skin Integrity: Goal: Risk for impaired skin integrity will decrease Outcome: Progressing

## 2022-09-27 LAB — GLUCOSE, CAPILLARY
Glucose-Capillary: 128 mg/dL — ABNORMAL HIGH (ref 70–99)
Glucose-Capillary: 217 mg/dL — ABNORMAL HIGH (ref 70–99)

## 2022-09-27 NOTE — Progress Notes (Signed)
Patient was prepared for discharge yesterday.  But it could not happen because of the confusion likely to short-term versus long-term placement of this patient. It was confirmed today that patient is a long-term resident at [facility] and has not been ambulating for last several months. He would not benefit from any short-term rehab at this time. Stable to discharge to long-term nursing home today Briefly seen and examined this morning.  No other change in plan [facility] will not bill for today.

## 2022-09-27 NOTE — Care Management Important Message (Signed)
Important Message  Patient Details IM Letter given Name: Ricky [name]. MRN: [id number] Date of Birth: 08/14/1940   Medicare Important Message Given:  Yes     Caren Macadam 09/27/2022, 11:53 AM

## 2022-09-27 NOTE — Progress Notes (Signed)
[facility] contacted and report was given to [place], Charity fundraiser. Patient assigned to room 502

## 2022-09-27 NOTE — Plan of Care (Signed)

## 2022-09-27 NOTE — TOC Transition Note (Signed)
Transition of Care [facility]) - CM/SW Discharge Note   Patient Details  Name: Ricky [name]. MRN: [id number] Date of Birth: October 06, 1940  Transition of Care [facility]) CM/SW Contact:  Howell Rucks, RN Phone Number: 09/27/2022, 12:40 PM   Clinical Narrative:   DC to [facility]. NCM call to pt's niece Lanora Manis), informed on approval at this time from insurance for short term rehab, DC order to return to [facility] today, Lanora Manis agreeable. [facility] for transportation. No further TOC needs identified.      Final next level of care: Long Term Nursing Home Barriers to Discharge: Barriers Resolved   Patient Goals and CMS Choice CMS Medicare.gov Compare Post Acute Care list provided to:: Patient Represenative (must comment) Harley Alto (Niece)  [phone] (Mobile)    Discharge Placement                Patient chooses bed at: [facility] Patient to be transferred to facility by: [facility] Name of family member notified: Harley Alto (Niece)  [phone] (Mobile Patient and family notified of of transfer: 09/27/22  Discharge Plan and Services Additional resources added to the After Visit Summary for                                       Social Determinants of Health (SDOH) Interventions SDOH Screenings   Food Insecurity: Patient Unable To Answer (09/24/2022)  Housing: Low Risk  (09/24/2022)  Transportation Needs: No Transportation Needs (09/24/2022)  Utilities: Not At Risk (09/24/2022)  Tobacco Use: Low Risk  (09/23/2022)     Readmission Risk Interventions    09/27/2022   12:38 PM 09/24/2022   10:26 AM  Readmission Risk Prevention Plan  Post Dischage Appt Complete   Medication Screening Complete   Transportation Screening Complete Complete  PCP or Specialist Appt within 5-7 Days  Complete  Home Care Screening  Complete  Medication Review (RN CM)  Complete

## 2022-09-27 NOTE — TOC Progression Note (Signed)
Transition of Care (TOC) - Progression Note    Patient Details  Name: Ricky [name]. MRN: [id number] Date of Birth: [birth year]  Transition of Care [facility]) CM/SW Contact  Howell Rucks, RN Phone Number: 09/27/2022, 10:27 AM  Clinical Narrative:  Call from Aram Beecham, preservice casemanager with [facility], requesting additional information on pt's PLOF. NCM outreached to [place], admissions liaison at [facility], confirmed pt has not ambulated in a long time, he does not walk with a walker, he can only stand and pivot to the wheelchair to the toile/chair. Aram Beecham notified, reports will escalate to Medical Director for review, will call with determination.    Barriers to Discharge: Continued Medical Work up  Expected Discharge Plan and Services       Living arrangements for the past 2 months: Skilled Nursing Facility Expected Discharge Date: 09/26/22                                     Social Determinants of Health (SDOH) Interventions SDOH Screenings   Food Insecurity: Patient Unable To Answer (09/24/2022)  Housing: Low Risk  (09/24/2022)  Transportation Needs: No Transportation Needs (09/24/2022)  Utilities: Not At Risk (09/24/2022)  Tobacco Use: Low Risk  (09/23/2022)    Readmission Risk Interventions    09/24/2022   10:26 AM  Readmission Risk Prevention Plan  Transportation Screening Complete  PCP or Specialist Appt within 5-7 Days Complete  Home Care Screening Complete  Medication Review (RN CM) Complete

## 2022-09-28 LAB — CULTURE, BLOOD (ROUTINE X 2)
Culture: NO GROWTH
Culture: NO GROWTH
Special Requests: ADEQUATE

## 2022-09-29 DIAGNOSIS — R600 Localized edema: Secondary | ICD-10-CM | POA: Diagnosis not present

## 2022-09-29 DIAGNOSIS — K5909 Other constipation: Secondary | ICD-10-CM | POA: Diagnosis not present

## 2022-09-29 DIAGNOSIS — K219 Gastro-esophageal reflux disease without esophagitis: Secondary | ICD-10-CM | POA: Diagnosis not present

## 2022-09-30 DIAGNOSIS — E785 Hyperlipidemia, unspecified: Secondary | ICD-10-CM | POA: Diagnosis not present

## 2022-09-30 DIAGNOSIS — I129 Hypertensive chronic kidney disease with stage 1 through stage 4 chronic kidney disease, or unspecified chronic kidney disease: Secondary | ICD-10-CM | POA: Diagnosis not present

## 2022-09-30 DIAGNOSIS — N182 Chronic kidney disease, stage 2 (mild): Secondary | ICD-10-CM | POA: Diagnosis not present

## 2022-09-30 DIAGNOSIS — G4709 Other insomnia: Secondary | ICD-10-CM | POA: Diagnosis not present

## 2022-09-30 DIAGNOSIS — N3281 Overactive bladder: Secondary | ICD-10-CM | POA: Diagnosis not present

## 2022-09-30 DIAGNOSIS — I5032 Chronic diastolic (congestive) heart failure: Secondary | ICD-10-CM | POA: Diagnosis not present

## 2022-09-30 DIAGNOSIS — J9601 Acute respiratory failure with hypoxia: Secondary | ICD-10-CM | POA: Diagnosis not present

## 2022-09-30 DIAGNOSIS — E119 Type 2 diabetes mellitus without complications: Secondary | ICD-10-CM | POA: Diagnosis not present

## 2022-09-30 DIAGNOSIS — K219 Gastro-esophageal reflux disease without esophagitis: Secondary | ICD-10-CM | POA: Diagnosis not present

## 2022-10-04 DIAGNOSIS — E1122 Type 2 diabetes mellitus with diabetic chronic kidney disease: Secondary | ICD-10-CM | POA: Diagnosis not present

## 2022-10-05 DIAGNOSIS — I5032 Chronic diastolic (congestive) heart failure: Secondary | ICD-10-CM | POA: Diagnosis not present

## 2022-10-05 DIAGNOSIS — A4102 Sepsis due to Methicillin resistant Staphylococcus aureus: Secondary | ICD-10-CM | POA: Diagnosis not present

## 2022-10-05 DIAGNOSIS — K5909 Other constipation: Secondary | ICD-10-CM | POA: Diagnosis not present

## 2022-10-05 DIAGNOSIS — I129 Hypertensive chronic kidney disease with stage 1 through stage 4 chronic kidney disease, or unspecified chronic kidney disease: Secondary | ICD-10-CM | POA: Diagnosis not present

## 2022-10-05 DIAGNOSIS — K219 Gastro-esophageal reflux disease without esophagitis: Secondary | ICD-10-CM | POA: Diagnosis not present

## 2022-10-07 DIAGNOSIS — E559 Vitamin D deficiency, unspecified: Secondary | ICD-10-CM | POA: Diagnosis not present

## 2022-10-07 DIAGNOSIS — I10 Essential (primary) hypertension: Secondary | ICD-10-CM | POA: Diagnosis not present

## 2022-10-07 DIAGNOSIS — E119 Type 2 diabetes mellitus without complications: Secondary | ICD-10-CM | POA: Diagnosis not present

## 2022-10-08 DIAGNOSIS — I5032 Chronic diastolic (congestive) heart failure: Secondary | ICD-10-CM | POA: Diagnosis not present

## 2022-10-08 DIAGNOSIS — I129 Hypertensive chronic kidney disease with stage 1 through stage 4 chronic kidney disease, or unspecified chronic kidney disease: Secondary | ICD-10-CM | POA: Diagnosis not present

## 2022-10-15 DIAGNOSIS — I129 Hypertensive chronic kidney disease with stage 1 through stage 4 chronic kidney disease, or unspecified chronic kidney disease: Secondary | ICD-10-CM | POA: Diagnosis not present

## 2022-10-15 DIAGNOSIS — E119 Type 2 diabetes mellitus without complications: Secondary | ICD-10-CM | POA: Diagnosis not present

## 2022-10-15 DIAGNOSIS — R1311 Dysphagia, oral phase: Secondary | ICD-10-CM | POA: Diagnosis not present

## 2022-10-15 DIAGNOSIS — S72435D Nondisplaced fracture of medial condyle of left femur, subsequent encounter for closed fracture with routine healing: Secondary | ICD-10-CM | POA: Diagnosis not present

## 2022-10-15 DIAGNOSIS — I5032 Chronic diastolic (congestive) heart failure: Secondary | ICD-10-CM | POA: Diagnosis not present

## 2022-10-15 DIAGNOSIS — G4709 Other insomnia: Secondary | ICD-10-CM | POA: Diagnosis not present

## 2022-10-16 DIAGNOSIS — S72435D Nondisplaced fracture of medial condyle of left femur, subsequent encounter for closed fracture with routine healing: Secondary | ICD-10-CM | POA: Diagnosis not present

## 2022-10-16 DIAGNOSIS — R1311 Dysphagia, oral phase: Secondary | ICD-10-CM | POA: Diagnosis not present

## 2022-10-17 DIAGNOSIS — R1311 Dysphagia, oral phase: Secondary | ICD-10-CM | POA: Diagnosis not present

## 2022-10-17 DIAGNOSIS — S72435D Nondisplaced fracture of medial condyle of left femur, subsequent encounter for closed fracture with routine healing: Secondary | ICD-10-CM | POA: Diagnosis not present

## 2022-10-18 DIAGNOSIS — S72435D Nondisplaced fracture of medial condyle of left femur, subsequent encounter for closed fracture with routine healing: Secondary | ICD-10-CM | POA: Diagnosis not present

## 2022-10-18 DIAGNOSIS — R1311 Dysphagia, oral phase: Secondary | ICD-10-CM | POA: Diagnosis not present

## 2022-10-18 DIAGNOSIS — Z741 Need for assistance with personal care: Secondary | ICD-10-CM | POA: Diagnosis not present

## 2022-10-18 DIAGNOSIS — R2689 Other abnormalities of gait and mobility: Secondary | ICD-10-CM | POA: Diagnosis not present

## 2022-10-18 DIAGNOSIS — R2681 Unsteadiness on feet: Secondary | ICD-10-CM | POA: Diagnosis not present

## 2022-10-18 DIAGNOSIS — M6281 Muscle weakness (generalized): Secondary | ICD-10-CM | POA: Diagnosis not present

## 2022-10-18 DIAGNOSIS — R293 Abnormal posture: Secondary | ICD-10-CM | POA: Diagnosis not present

## 2022-10-18 DIAGNOSIS — I110 Hypertensive heart disease with heart failure: Secondary | ICD-10-CM | POA: Diagnosis not present

## 2022-10-18 DIAGNOSIS — M6259 Muscle wasting and atrophy, not elsewhere classified, multiple sites: Secondary | ICD-10-CM | POA: Diagnosis not present

## 2022-10-18 DIAGNOSIS — E1169 Type 2 diabetes mellitus with other specified complication: Secondary | ICD-10-CM | POA: Diagnosis not present

## 2022-10-19 DIAGNOSIS — M6281 Muscle weakness (generalized): Secondary | ICD-10-CM | POA: Diagnosis not present

## 2022-10-19 DIAGNOSIS — R293 Abnormal posture: Secondary | ICD-10-CM | POA: Diagnosis not present

## 2022-10-19 DIAGNOSIS — M6259 Muscle wasting and atrophy, not elsewhere classified, multiple sites: Secondary | ICD-10-CM | POA: Diagnosis not present

## 2022-10-19 DIAGNOSIS — S72435D Nondisplaced fracture of medial condyle of left femur, subsequent encounter for closed fracture with routine healing: Secondary | ICD-10-CM | POA: Diagnosis not present

## 2022-10-19 DIAGNOSIS — I10 Essential (primary) hypertension: Secondary | ICD-10-CM | POA: Diagnosis not present

## 2022-10-19 DIAGNOSIS — E1169 Type 2 diabetes mellitus with other specified complication: Secondary | ICD-10-CM | POA: Diagnosis not present

## 2022-10-19 DIAGNOSIS — R1311 Dysphagia, oral phase: Secondary | ICD-10-CM | POA: Diagnosis not present

## 2022-10-19 DIAGNOSIS — Z741 Need for assistance with personal care: Secondary | ICD-10-CM | POA: Diagnosis not present

## 2022-10-19 DIAGNOSIS — E1140 Type 2 diabetes mellitus with diabetic neuropathy, unspecified: Secondary | ICD-10-CM | POA: Diagnosis not present

## 2022-10-19 DIAGNOSIS — I5032 Chronic diastolic (congestive) heart failure: Secondary | ICD-10-CM | POA: Diagnosis not present

## 2022-10-20 DIAGNOSIS — E1169 Type 2 diabetes mellitus with other specified complication: Secondary | ICD-10-CM | POA: Diagnosis not present

## 2022-10-20 DIAGNOSIS — I129 Hypertensive chronic kidney disease with stage 1 through stage 4 chronic kidney disease, or unspecified chronic kidney disease: Secondary | ICD-10-CM | POA: Diagnosis not present

## 2022-10-20 DIAGNOSIS — K219 Gastro-esophageal reflux disease without esophagitis: Secondary | ICD-10-CM | POA: Diagnosis not present

## 2022-10-20 DIAGNOSIS — I5032 Chronic diastolic (congestive) heart failure: Secondary | ICD-10-CM | POA: Diagnosis not present

## 2022-10-21 DIAGNOSIS — Z741 Need for assistance with personal care: Secondary | ICD-10-CM | POA: Diagnosis not present

## 2022-10-21 DIAGNOSIS — I110 Hypertensive heart disease with heart failure: Secondary | ICD-10-CM | POA: Diagnosis not present

## 2022-10-21 DIAGNOSIS — R2681 Unsteadiness on feet: Secondary | ICD-10-CM | POA: Diagnosis not present

## 2022-10-21 DIAGNOSIS — M6281 Muscle weakness (generalized): Secondary | ICD-10-CM | POA: Diagnosis not present

## 2022-10-21 DIAGNOSIS — E1169 Type 2 diabetes mellitus with other specified complication: Secondary | ICD-10-CM | POA: Diagnosis not present

## 2022-10-21 DIAGNOSIS — R2689 Other abnormalities of gait and mobility: Secondary | ICD-10-CM | POA: Diagnosis not present

## 2022-10-21 DIAGNOSIS — R1311 Dysphagia, oral phase: Secondary | ICD-10-CM | POA: Diagnosis not present

## 2022-10-21 DIAGNOSIS — M6259 Muscle wasting and atrophy, not elsewhere classified, multiple sites: Secondary | ICD-10-CM | POA: Diagnosis not present

## 2022-10-21 DIAGNOSIS — S72435D Nondisplaced fracture of medial condyle of left femur, subsequent encounter for closed fracture with routine healing: Secondary | ICD-10-CM | POA: Diagnosis not present

## 2022-10-21 DIAGNOSIS — R293 Abnormal posture: Secondary | ICD-10-CM | POA: Diagnosis not present

## 2022-10-22 DIAGNOSIS — S72435D Nondisplaced fracture of medial condyle of left femur, subsequent encounter for closed fracture with routine healing: Secondary | ICD-10-CM | POA: Diagnosis not present

## 2022-10-22 DIAGNOSIS — R1311 Dysphagia, oral phase: Secondary | ICD-10-CM | POA: Diagnosis not present

## 2022-10-23 DIAGNOSIS — R1311 Dysphagia, oral phase: Secondary | ICD-10-CM | POA: Diagnosis not present

## 2022-10-23 DIAGNOSIS — S72435D Nondisplaced fracture of medial condyle of left femur, subsequent encounter for closed fracture with routine healing: Secondary | ICD-10-CM | POA: Diagnosis not present

## 2022-10-28 DIAGNOSIS — R1311 Dysphagia, oral phase: Secondary | ICD-10-CM | POA: Diagnosis not present

## 2022-10-28 DIAGNOSIS — S72435D Nondisplaced fracture of medial condyle of left femur, subsequent encounter for closed fracture with routine healing: Secondary | ICD-10-CM | POA: Diagnosis not present

## 2022-10-29 DIAGNOSIS — R1311 Dysphagia, oral phase: Secondary | ICD-10-CM | POA: Diagnosis not present

## 2022-10-29 DIAGNOSIS — S72435D Nondisplaced fracture of medial condyle of left femur, subsequent encounter for closed fracture with routine healing: Secondary | ICD-10-CM | POA: Diagnosis not present

## 2022-10-30 DIAGNOSIS — R2681 Unsteadiness on feet: Secondary | ICD-10-CM | POA: Diagnosis not present

## 2022-10-30 DIAGNOSIS — I110 Hypertensive heart disease with heart failure: Secondary | ICD-10-CM | POA: Diagnosis not present

## 2022-10-30 DIAGNOSIS — M6259 Muscle wasting and atrophy, not elsewhere classified, multiple sites: Secondary | ICD-10-CM | POA: Diagnosis not present

## 2022-10-30 DIAGNOSIS — M6281 Muscle weakness (generalized): Secondary | ICD-10-CM | POA: Diagnosis not present

## 2022-10-30 DIAGNOSIS — Z741 Need for assistance with personal care: Secondary | ICD-10-CM | POA: Diagnosis not present

## 2022-10-30 DIAGNOSIS — R2689 Other abnormalities of gait and mobility: Secondary | ICD-10-CM | POA: Diagnosis not present

## 2022-10-30 DIAGNOSIS — S72435D Nondisplaced fracture of medial condyle of left femur, subsequent encounter for closed fracture with routine healing: Secondary | ICD-10-CM | POA: Diagnosis not present

## 2022-10-30 DIAGNOSIS — R1311 Dysphagia, oral phase: Secondary | ICD-10-CM | POA: Diagnosis not present

## 2022-10-30 DIAGNOSIS — R293 Abnormal posture: Secondary | ICD-10-CM | POA: Diagnosis not present

## 2022-10-30 DIAGNOSIS — E1169 Type 2 diabetes mellitus with other specified complication: Secondary | ICD-10-CM | POA: Diagnosis not present

## 2022-10-31 DIAGNOSIS — R1311 Dysphagia, oral phase: Secondary | ICD-10-CM | POA: Diagnosis not present

## 2022-10-31 DIAGNOSIS — Z741 Need for assistance with personal care: Secondary | ICD-10-CM | POA: Diagnosis not present

## 2022-10-31 DIAGNOSIS — E1169 Type 2 diabetes mellitus with other specified complication: Secondary | ICD-10-CM | POA: Diagnosis not present

## 2022-10-31 DIAGNOSIS — R293 Abnormal posture: Secondary | ICD-10-CM | POA: Diagnosis not present

## 2022-10-31 DIAGNOSIS — R2681 Unsteadiness on feet: Secondary | ICD-10-CM | POA: Diagnosis not present

## 2022-10-31 DIAGNOSIS — M6259 Muscle wasting and atrophy, not elsewhere classified, multiple sites: Secondary | ICD-10-CM | POA: Diagnosis not present

## 2022-10-31 DIAGNOSIS — M6281 Muscle weakness (generalized): Secondary | ICD-10-CM | POA: Diagnosis not present

## 2022-10-31 DIAGNOSIS — I110 Hypertensive heart disease with heart failure: Secondary | ICD-10-CM | POA: Diagnosis not present

## 2022-10-31 DIAGNOSIS — S72435D Nondisplaced fracture of medial condyle of left femur, subsequent encounter for closed fracture with routine healing: Secondary | ICD-10-CM | POA: Diagnosis not present

## 2022-10-31 DIAGNOSIS — R2689 Other abnormalities of gait and mobility: Secondary | ICD-10-CM | POA: Diagnosis not present

## 2022-11-01 DIAGNOSIS — Z741 Need for assistance with personal care: Secondary | ICD-10-CM | POA: Diagnosis not present

## 2022-11-01 DIAGNOSIS — R2681 Unsteadiness on feet: Secondary | ICD-10-CM | POA: Diagnosis not present

## 2022-11-01 DIAGNOSIS — I110 Hypertensive heart disease with heart failure: Secondary | ICD-10-CM | POA: Diagnosis not present

## 2022-11-01 DIAGNOSIS — R2689 Other abnormalities of gait and mobility: Secondary | ICD-10-CM | POA: Diagnosis not present

## 2022-11-01 DIAGNOSIS — R1311 Dysphagia, oral phase: Secondary | ICD-10-CM | POA: Diagnosis not present

## 2022-11-01 DIAGNOSIS — M6259 Muscle wasting and atrophy, not elsewhere classified, multiple sites: Secondary | ICD-10-CM | POA: Diagnosis not present

## 2022-11-01 DIAGNOSIS — S72435D Nondisplaced fracture of medial condyle of left femur, subsequent encounter for closed fracture with routine healing: Secondary | ICD-10-CM | POA: Diagnosis not present

## 2022-11-01 DIAGNOSIS — R293 Abnormal posture: Secondary | ICD-10-CM | POA: Diagnosis not present

## 2022-11-01 DIAGNOSIS — E1169 Type 2 diabetes mellitus with other specified complication: Secondary | ICD-10-CM | POA: Diagnosis not present

## 2022-11-01 DIAGNOSIS — M6281 Muscle weakness (generalized): Secondary | ICD-10-CM | POA: Diagnosis not present

## 2022-11-04 DIAGNOSIS — R1311 Dysphagia, oral phase: Secondary | ICD-10-CM | POA: Diagnosis not present

## 2022-11-04 DIAGNOSIS — S72435D Nondisplaced fracture of medial condyle of left femur, subsequent encounter for closed fracture with routine healing: Secondary | ICD-10-CM | POA: Diagnosis not present

## 2022-11-05 DIAGNOSIS — S72435D Nondisplaced fracture of medial condyle of left femur, subsequent encounter for closed fracture with routine healing: Secondary | ICD-10-CM | POA: Diagnosis not present

## 2022-11-05 DIAGNOSIS — R1311 Dysphagia, oral phase: Secondary | ICD-10-CM | POA: Diagnosis not present

## 2022-11-06 DIAGNOSIS — R1311 Dysphagia, oral phase: Secondary | ICD-10-CM | POA: Diagnosis not present

## 2022-11-06 DIAGNOSIS — S72435D Nondisplaced fracture of medial condyle of left femur, subsequent encounter for closed fracture with routine healing: Secondary | ICD-10-CM | POA: Diagnosis not present

## 2022-11-07 DIAGNOSIS — S72435D Nondisplaced fracture of medial condyle of left femur, subsequent encounter for closed fracture with routine healing: Secondary | ICD-10-CM | POA: Diagnosis not present

## 2022-11-07 DIAGNOSIS — R1311 Dysphagia, oral phase: Secondary | ICD-10-CM | POA: Diagnosis not present

## 2022-11-08 DIAGNOSIS — R1311 Dysphagia, oral phase: Secondary | ICD-10-CM | POA: Diagnosis not present

## 2022-11-08 DIAGNOSIS — S72435D Nondisplaced fracture of medial condyle of left femur, subsequent encounter for closed fracture with routine healing: Secondary | ICD-10-CM | POA: Diagnosis not present

## 2022-11-11 DIAGNOSIS — S72435D Nondisplaced fracture of medial condyle of left femur, subsequent encounter for closed fracture with routine healing: Secondary | ICD-10-CM | POA: Diagnosis not present

## 2022-11-11 DIAGNOSIS — R1311 Dysphagia, oral phase: Secondary | ICD-10-CM | POA: Diagnosis not present

## 2022-11-12 DIAGNOSIS — S72435D Nondisplaced fracture of medial condyle of left femur, subsequent encounter for closed fracture with routine healing: Secondary | ICD-10-CM | POA: Diagnosis not present

## 2022-11-12 DIAGNOSIS — R1311 Dysphagia, oral phase: Secondary | ICD-10-CM | POA: Diagnosis not present

## 2022-11-13 DIAGNOSIS — R1311 Dysphagia, oral phase: Secondary | ICD-10-CM | POA: Diagnosis not present

## 2022-11-13 DIAGNOSIS — I110 Hypertensive heart disease with heart failure: Secondary | ICD-10-CM | POA: Diagnosis not present

## 2022-11-13 DIAGNOSIS — R2689 Other abnormalities of gait and mobility: Secondary | ICD-10-CM | POA: Diagnosis not present

## 2022-11-13 DIAGNOSIS — R293 Abnormal posture: Secondary | ICD-10-CM | POA: Diagnosis not present

## 2022-11-13 DIAGNOSIS — R2681 Unsteadiness on feet: Secondary | ICD-10-CM | POA: Diagnosis not present

## 2022-11-13 DIAGNOSIS — S72435D Nondisplaced fracture of medial condyle of left femur, subsequent encounter for closed fracture with routine healing: Secondary | ICD-10-CM | POA: Diagnosis not present

## 2022-11-13 DIAGNOSIS — Z741 Need for assistance with personal care: Secondary | ICD-10-CM | POA: Diagnosis not present

## 2022-11-13 DIAGNOSIS — M6281 Muscle weakness (generalized): Secondary | ICD-10-CM | POA: Diagnosis not present

## 2022-11-13 DIAGNOSIS — M6259 Muscle wasting and atrophy, not elsewhere classified, multiple sites: Secondary | ICD-10-CM | POA: Diagnosis not present

## 2022-11-13 DIAGNOSIS — E1169 Type 2 diabetes mellitus with other specified complication: Secondary | ICD-10-CM | POA: Diagnosis not present

## 2022-11-14 DIAGNOSIS — M6281 Muscle weakness (generalized): Secondary | ICD-10-CM | POA: Diagnosis not present

## 2022-11-14 DIAGNOSIS — I110 Hypertensive heart disease with heart failure: Secondary | ICD-10-CM | POA: Diagnosis not present

## 2022-11-14 DIAGNOSIS — M6259 Muscle wasting and atrophy, not elsewhere classified, multiple sites: Secondary | ICD-10-CM | POA: Diagnosis not present

## 2022-11-14 DIAGNOSIS — R2689 Other abnormalities of gait and mobility: Secondary | ICD-10-CM | POA: Diagnosis not present

## 2022-11-14 DIAGNOSIS — R1311 Dysphagia, oral phase: Secondary | ICD-10-CM | POA: Diagnosis not present

## 2022-11-14 DIAGNOSIS — E1169 Type 2 diabetes mellitus with other specified complication: Secondary | ICD-10-CM | POA: Diagnosis not present

## 2022-11-14 DIAGNOSIS — R2681 Unsteadiness on feet: Secondary | ICD-10-CM | POA: Diagnosis not present

## 2022-11-14 DIAGNOSIS — S72435D Nondisplaced fracture of medial condyle of left femur, subsequent encounter for closed fracture with routine healing: Secondary | ICD-10-CM | POA: Diagnosis not present

## 2022-11-14 DIAGNOSIS — R293 Abnormal posture: Secondary | ICD-10-CM | POA: Diagnosis not present

## 2022-11-14 DIAGNOSIS — Z741 Need for assistance with personal care: Secondary | ICD-10-CM | POA: Diagnosis not present

## 2022-11-15 DIAGNOSIS — S72435D Nondisplaced fracture of medial condyle of left femur, subsequent encounter for closed fracture with routine healing: Secondary | ICD-10-CM | POA: Diagnosis not present

## 2022-11-15 DIAGNOSIS — R1311 Dysphagia, oral phase: Secondary | ICD-10-CM | POA: Diagnosis not present

## 2022-11-16 DIAGNOSIS — R2689 Other abnormalities of gait and mobility: Secondary | ICD-10-CM | POA: Diagnosis not present

## 2022-11-16 DIAGNOSIS — I110 Hypertensive heart disease with heart failure: Secondary | ICD-10-CM | POA: Diagnosis not present

## 2022-11-16 DIAGNOSIS — M6281 Muscle weakness (generalized): Secondary | ICD-10-CM | POA: Diagnosis not present

## 2022-11-16 DIAGNOSIS — R293 Abnormal posture: Secondary | ICD-10-CM | POA: Diagnosis not present

## 2022-11-16 DIAGNOSIS — R2681 Unsteadiness on feet: Secondary | ICD-10-CM | POA: Diagnosis not present

## 2022-11-16 DIAGNOSIS — E1169 Type 2 diabetes mellitus with other specified complication: Secondary | ICD-10-CM | POA: Diagnosis not present

## 2022-11-16 DIAGNOSIS — M6259 Muscle wasting and atrophy, not elsewhere classified, multiple sites: Secondary | ICD-10-CM | POA: Diagnosis not present

## 2022-11-16 DIAGNOSIS — S72435D Nondisplaced fracture of medial condyle of left femur, subsequent encounter for closed fracture with routine healing: Secondary | ICD-10-CM | POA: Diagnosis not present

## 2022-11-16 DIAGNOSIS — Z741 Need for assistance with personal care: Secondary | ICD-10-CM | POA: Diagnosis not present

## 2022-11-18 DIAGNOSIS — R1311 Dysphagia, oral phase: Secondary | ICD-10-CM | POA: Diagnosis not present

## 2022-11-18 DIAGNOSIS — S72435D Nondisplaced fracture of medial condyle of left femur, subsequent encounter for closed fracture with routine healing: Secondary | ICD-10-CM | POA: Diagnosis not present

## 2022-11-19 DIAGNOSIS — R1311 Dysphagia, oral phase: Secondary | ICD-10-CM | POA: Diagnosis not present

## 2022-11-19 DIAGNOSIS — S72435D Nondisplaced fracture of medial condyle of left femur, subsequent encounter for closed fracture with routine healing: Secondary | ICD-10-CM | POA: Diagnosis not present

## 2022-11-20 DIAGNOSIS — S72435D Nondisplaced fracture of medial condyle of left femur, subsequent encounter for closed fracture with routine healing: Secondary | ICD-10-CM | POA: Diagnosis not present

## 2022-11-20 DIAGNOSIS — R1311 Dysphagia, oral phase: Secondary | ICD-10-CM | POA: Diagnosis not present

## 2022-11-21 DIAGNOSIS — S72435D Nondisplaced fracture of medial condyle of left femur, subsequent encounter for closed fracture with routine healing: Secondary | ICD-10-CM | POA: Diagnosis not present

## 2022-11-21 DIAGNOSIS — R1311 Dysphagia, oral phase: Secondary | ICD-10-CM | POA: Diagnosis not present

## 2022-11-22 DIAGNOSIS — R1311 Dysphagia, oral phase: Secondary | ICD-10-CM | POA: Diagnosis not present

## 2022-11-22 DIAGNOSIS — S72435D Nondisplaced fracture of medial condyle of left femur, subsequent encounter for closed fracture with routine healing: Secondary | ICD-10-CM | POA: Diagnosis not present

## 2022-11-25 DIAGNOSIS — S72435D Nondisplaced fracture of medial condyle of left femur, subsequent encounter for closed fracture with routine healing: Secondary | ICD-10-CM | POA: Diagnosis not present

## 2022-11-25 DIAGNOSIS — R1311 Dysphagia, oral phase: Secondary | ICD-10-CM | POA: Diagnosis not present

## 2022-11-29 DIAGNOSIS — I110 Hypertensive heart disease with heart failure: Secondary | ICD-10-CM | POA: Diagnosis not present

## 2022-11-29 DIAGNOSIS — R2689 Other abnormalities of gait and mobility: Secondary | ICD-10-CM | POA: Diagnosis not present

## 2022-11-29 DIAGNOSIS — M6281 Muscle weakness (generalized): Secondary | ICD-10-CM | POA: Diagnosis not present

## 2022-11-29 DIAGNOSIS — Z741 Need for assistance with personal care: Secondary | ICD-10-CM | POA: Diagnosis not present

## 2022-11-29 DIAGNOSIS — S72435D Nondisplaced fracture of medial condyle of left femur, subsequent encounter for closed fracture with routine healing: Secondary | ICD-10-CM | POA: Diagnosis not present

## 2022-11-29 DIAGNOSIS — R293 Abnormal posture: Secondary | ICD-10-CM | POA: Diagnosis not present

## 2022-11-29 DIAGNOSIS — E1169 Type 2 diabetes mellitus with other specified complication: Secondary | ICD-10-CM | POA: Diagnosis not present

## 2022-11-29 DIAGNOSIS — R2681 Unsteadiness on feet: Secondary | ICD-10-CM | POA: Diagnosis not present

## 2022-11-29 DIAGNOSIS — M6259 Muscle wasting and atrophy, not elsewhere classified, multiple sites: Secondary | ICD-10-CM | POA: Diagnosis not present

## 2022-12-03 DIAGNOSIS — R2689 Other abnormalities of gait and mobility: Secondary | ICD-10-CM | POA: Diagnosis not present

## 2022-12-03 DIAGNOSIS — S72435D Nondisplaced fracture of medial condyle of left femur, subsequent encounter for closed fracture with routine healing: Secondary | ICD-10-CM | POA: Diagnosis not present

## 2022-12-03 DIAGNOSIS — I110 Hypertensive heart disease with heart failure: Secondary | ICD-10-CM | POA: Diagnosis not present

## 2022-12-03 DIAGNOSIS — M6259 Muscle wasting and atrophy, not elsewhere classified, multiple sites: Secondary | ICD-10-CM | POA: Diagnosis not present

## 2022-12-03 DIAGNOSIS — R2681 Unsteadiness on feet: Secondary | ICD-10-CM | POA: Diagnosis not present

## 2022-12-03 DIAGNOSIS — R293 Abnormal posture: Secondary | ICD-10-CM | POA: Diagnosis not present

## 2022-12-03 DIAGNOSIS — M6281 Muscle weakness (generalized): Secondary | ICD-10-CM | POA: Diagnosis not present

## 2022-12-03 DIAGNOSIS — E1169 Type 2 diabetes mellitus with other specified complication: Secondary | ICD-10-CM | POA: Diagnosis not present

## 2022-12-03 DIAGNOSIS — Z741 Need for assistance with personal care: Secondary | ICD-10-CM | POA: Diagnosis not present

## 2022-12-05 ENCOUNTER — Encounter: Payer: Self-pay | Admitting: Podiatry

## 2022-12-06 DIAGNOSIS — E119 Type 2 diabetes mellitus without complications: Secondary | ICD-10-CM | POA: Diagnosis not present

## 2022-12-15 DIAGNOSIS — M6281 Muscle weakness (generalized): Secondary | ICD-10-CM | POA: Diagnosis not present

## 2022-12-15 DIAGNOSIS — I110 Hypertensive heart disease with heart failure: Secondary | ICD-10-CM | POA: Diagnosis not present

## 2022-12-15 DIAGNOSIS — R2689 Other abnormalities of gait and mobility: Secondary | ICD-10-CM | POA: Diagnosis not present

## 2022-12-15 DIAGNOSIS — Z741 Need for assistance with personal care: Secondary | ICD-10-CM | POA: Diagnosis not present

## 2022-12-15 DIAGNOSIS — R293 Abnormal posture: Secondary | ICD-10-CM | POA: Diagnosis not present

## 2022-12-15 DIAGNOSIS — M6259 Muscle wasting and atrophy, not elsewhere classified, multiple sites: Secondary | ICD-10-CM | POA: Diagnosis not present

## 2022-12-15 DIAGNOSIS — R2681 Unsteadiness on feet: Secondary | ICD-10-CM | POA: Diagnosis not present

## 2022-12-15 DIAGNOSIS — E1169 Type 2 diabetes mellitus with other specified complication: Secondary | ICD-10-CM | POA: Diagnosis not present

## 2022-12-15 DIAGNOSIS — S72435D Nondisplaced fracture of medial condyle of left femur, subsequent encounter for closed fracture with routine healing: Secondary | ICD-10-CM | POA: Diagnosis not present

## 2022-12-19 DIAGNOSIS — E1169 Type 2 diabetes mellitus with other specified complication: Secondary | ICD-10-CM | POA: Diagnosis not present

## 2022-12-19 DIAGNOSIS — M6281 Muscle weakness (generalized): Secondary | ICD-10-CM | POA: Diagnosis not present

## 2022-12-19 DIAGNOSIS — I110 Hypertensive heart disease with heart failure: Secondary | ICD-10-CM | POA: Diagnosis not present

## 2022-12-19 DIAGNOSIS — S72435D Nondisplaced fracture of medial condyle of left femur, subsequent encounter for closed fracture with routine healing: Secondary | ICD-10-CM | POA: Diagnosis not present

## 2022-12-19 DIAGNOSIS — M6259 Muscle wasting and atrophy, not elsewhere classified, multiple sites: Secondary | ICD-10-CM | POA: Diagnosis not present

## 2022-12-19 DIAGNOSIS — R293 Abnormal posture: Secondary | ICD-10-CM | POA: Diagnosis not present

## 2022-12-19 DIAGNOSIS — R2681 Unsteadiness on feet: Secondary | ICD-10-CM | POA: Diagnosis not present

## 2022-12-19 DIAGNOSIS — Z741 Need for assistance with personal care: Secondary | ICD-10-CM | POA: Diagnosis not present

## 2022-12-19 DIAGNOSIS — R2689 Other abnormalities of gait and mobility: Secondary | ICD-10-CM | POA: Diagnosis not present

## 2022-12-20 ENCOUNTER — Ambulatory Visit: Payer: Medicare Other | Admitting: Podiatry

## 2022-12-24 ENCOUNTER — Ambulatory Visit (INDEPENDENT_AMBULATORY_CARE_PROVIDER_SITE_OTHER): Payer: Medicare Other

## 2022-12-24 ENCOUNTER — Encounter: Payer: Self-pay | Admitting: Podiatry

## 2022-12-24 ENCOUNTER — Ambulatory Visit: Payer: Medicare Other | Admitting: Podiatry

## 2022-12-24 ENCOUNTER — Ambulatory Visit (INDEPENDENT_AMBULATORY_CARE_PROVIDER_SITE_OTHER): Payer: Medicare Other | Admitting: Podiatry

## 2022-12-24 DIAGNOSIS — M79671 Pain in right foot: Secondary | ICD-10-CM

## 2022-12-24 DIAGNOSIS — M2042 Other hammer toe(s) (acquired), left foot: Secondary | ICD-10-CM | POA: Diagnosis not present

## 2022-12-24 DIAGNOSIS — M2041 Other hammer toe(s) (acquired), right foot: Secondary | ICD-10-CM | POA: Diagnosis not present

## 2022-12-24 DIAGNOSIS — L089 Local infection of the skin and subcutaneous tissue, unspecified: Secondary | ICD-10-CM | POA: Diagnosis not present

## 2022-12-24 DIAGNOSIS — E1159 Type 2 diabetes mellitus with other circulatory complications: Secondary | ICD-10-CM

## 2022-12-24 DIAGNOSIS — L98491 Non-pressure chronic ulcer of skin of other sites limited to breakdown of skin: Secondary | ICD-10-CM | POA: Diagnosis not present

## 2022-12-24 MED ORDER — DOXYCYCLINE HYCLATE 100 MG PO TABS: 100.0000 mg | ORAL_TABLET | Freq: Two times a day (BID) | ORAL | 0 refills | Status: AC

## 2022-12-24 NOTE — Progress Notes (Unsigned)
This patient returns to my office for at risk foot care.  This patient requires this care by a professional since this patient will be at risk due to having CKD and diabetes.  He is brought to the office from nursing home saying the toes on his right foot are bleeding.  He is a poor historian and is unable to describe any injury or treatment afforded.  This patient presents to the office with no bandage.  This patient presents to the office in a wheelchair.  This patient presents for at risk foot care today.  General Appearance  Alert, conversant and in no acute stress.  Vascular  Dorsalis pedis and posterior tibial  pulses are weakly/absent  palpable  right foot due to swelling.    Capillary return is within normal limits  bilaterally. Cold feet  Bilaterally. Absent digital hair B/L.  Neurologic  Senn-Weinstein monofilament wire test diminished   bilaterally. Muscle power within normal limits bilaterally.  Nails Thick disfigured discolored nails with subungual debris  from hallux to fifth toes bilaterally. No evidence of bacterial infection or drainage bilaterally.  Orthopedic  No limitations of motion  feet .  No crepitus or effusions noted.  Contracted digits  B/L. Hammer toes  B/L.  Skin  no porokeratosis noted bilaterally.    Open wound medial aspect right hallux.  Red swollen purplish discoloration on the dorsum of right foot.    Infected ulcer right foot.   Consent was obtained for treatment procedures.  Examination of right foot reveals red swollen forefoot dorsally.  Open wound noted right hallux.  Xrays taken reveal no evidence of infection.  The right foot was bandaged with betadine/DSD and coban.  Dr.  Logan Bores was consulted and recommended  we treat the infection.  Patient  refused vascular studies until later.  Surgical shoe was dispensed. Prescribe doxycycline one bid. RTC 1 week.  Due to his living situation bandaging was sent home with him as needed to change bandage as  needed.   Return office visit   1 week for further evaluation.                     Helane Gunther DPM

## 2022-12-26 DIAGNOSIS — E1169 Type 2 diabetes mellitus with other specified complication: Secondary | ICD-10-CM | POA: Diagnosis not present

## 2022-12-26 DIAGNOSIS — I110 Hypertensive heart disease with heart failure: Secondary | ICD-10-CM | POA: Diagnosis not present

## 2022-12-26 DIAGNOSIS — S72435D Nondisplaced fracture of medial condyle of left femur, subsequent encounter for closed fracture with routine healing: Secondary | ICD-10-CM | POA: Diagnosis not present

## 2022-12-26 DIAGNOSIS — R2689 Other abnormalities of gait and mobility: Secondary | ICD-10-CM | POA: Diagnosis not present

## 2022-12-26 DIAGNOSIS — M6281 Muscle weakness (generalized): Secondary | ICD-10-CM | POA: Diagnosis not present

## 2022-12-26 DIAGNOSIS — Z741 Need for assistance with personal care: Secondary | ICD-10-CM | POA: Diagnosis not present

## 2022-12-26 DIAGNOSIS — R2681 Unsteadiness on feet: Secondary | ICD-10-CM | POA: Diagnosis not present

## 2022-12-26 DIAGNOSIS — R293 Abnormal posture: Secondary | ICD-10-CM | POA: Diagnosis not present

## 2022-12-26 DIAGNOSIS — M6259 Muscle wasting and atrophy, not elsewhere classified, multiple sites: Secondary | ICD-10-CM | POA: Diagnosis not present

## 2022-12-30 ENCOUNTER — Ambulatory Visit (INDEPENDENT_AMBULATORY_CARE_PROVIDER_SITE_OTHER): Payer: Medicare Other | Admitting: Podiatry

## 2022-12-30 ENCOUNTER — Encounter: Payer: Self-pay | Admitting: Podiatry

## 2022-12-30 DIAGNOSIS — L98491 Non-pressure chronic ulcer of skin of other sites limited to breakdown of skin: Secondary | ICD-10-CM | POA: Diagnosis not present

## 2022-12-30 DIAGNOSIS — E1159 Type 2 diabetes mellitus with other circulatory complications: Secondary | ICD-10-CM | POA: Diagnosis not present

## 2022-12-30 DIAGNOSIS — L089 Local infection of the skin and subcutaneous tissue, unspecified: Secondary | ICD-10-CM | POA: Diagnosis not present

## 2022-12-31 DIAGNOSIS — R2689 Other abnormalities of gait and mobility: Secondary | ICD-10-CM | POA: Diagnosis not present

## 2022-12-31 DIAGNOSIS — R2681 Unsteadiness on feet: Secondary | ICD-10-CM | POA: Diagnosis not present

## 2022-12-31 DIAGNOSIS — S72435D Nondisplaced fracture of medial condyle of left femur, subsequent encounter for closed fracture with routine healing: Secondary | ICD-10-CM | POA: Diagnosis not present

## 2022-12-31 DIAGNOSIS — R293 Abnormal posture: Secondary | ICD-10-CM | POA: Diagnosis not present

## 2022-12-31 DIAGNOSIS — I110 Hypertensive heart disease with heart failure: Secondary | ICD-10-CM | POA: Diagnosis not present

## 2022-12-31 DIAGNOSIS — Z741 Need for assistance with personal care: Secondary | ICD-10-CM | POA: Diagnosis not present

## 2022-12-31 DIAGNOSIS — M6259 Muscle wasting and atrophy, not elsewhere classified, multiple sites: Secondary | ICD-10-CM | POA: Diagnosis not present

## 2022-12-31 DIAGNOSIS — M6281 Muscle weakness (generalized): Secondary | ICD-10-CM | POA: Diagnosis not present

## 2022-12-31 DIAGNOSIS — E1169 Type 2 diabetes mellitus with other specified complication: Secondary | ICD-10-CM | POA: Diagnosis not present

## 2022-12-31 NOTE — Progress Notes (Signed)
This patient presents to the office since he was diagnosed with infection of right foot with ulcer on his big toe right foot.  He has been changing his bandage daily and has been taking his antibiotics( doxycycline} the last few days.  He says his right foot has improved.  He says there is less pain and swelling right foot.  No drainage or bleeding was noted.   He is pleased with the improvement.  He presents to the office for continued evaluation and treatment.  He has history of diabetes and CKD. He presents to the office in his wheelchair.  General Appearance  Alert, conversant and in no acute stress.  Vascular  Dorsalis pedis and posterior tibial  pulses are  weakly palpable  bilaterally.  Capillary return is within normal limits  bilaterally. Temperature is within normal limits  bilaterally.  Neurologic  Senn-Weinstein monofilament wire test within normal limits  bilaterally. Muscle power within normal limits bilaterally.  Nails Thick disfigured discolored nails with subungual debris  from hallux to fifth toes bilaterally. No evidence of bacterial infection or drainage bilaterally.  Orthopedic  No limitations of motion  feet .  No crepitus or effusions noted.  No bony pathology or digital deformities noted.  Skin  Healing ulcer on right hallux.  Swelling is diminishing in his right foot.  Right foot has decreased temperature right foot.  Open wounds on 3,4 and fifth toes right foot with swelling.    Infected ulcer right foot.  ROV.  Patient is improving from his last visit.   Dr.  Logan Bores  rendered second opinion on his right foot and noted improvement.  The right foot was then bandaged with betadine/DSD.  Patient was told to continue antibiotics.  Continue to change bandages daily.  Call the office if this condition worsens.  RTC 2 weeks.  Helane Gunther DPM

## 2023-01-02 DIAGNOSIS — L97511 Non-pressure chronic ulcer of other part of right foot limited to breakdown of skin: Secondary | ICD-10-CM | POA: Diagnosis not present

## 2023-01-02 DIAGNOSIS — E1140 Type 2 diabetes mellitus with diabetic neuropathy, unspecified: Secondary | ICD-10-CM | POA: Diagnosis not present

## 2023-01-02 DIAGNOSIS — E11621 Type 2 diabetes mellitus with foot ulcer: Secondary | ICD-10-CM | POA: Diagnosis not present

## 2023-01-02 DIAGNOSIS — E1169 Type 2 diabetes mellitus with other specified complication: Secondary | ICD-10-CM | POA: Diagnosis not present

## 2023-01-06 DIAGNOSIS — M6259 Muscle wasting and atrophy, not elsewhere classified, multiple sites: Secondary | ICD-10-CM | POA: Diagnosis not present

## 2023-01-06 DIAGNOSIS — E1169 Type 2 diabetes mellitus with other specified complication: Secondary | ICD-10-CM | POA: Diagnosis not present

## 2023-01-06 DIAGNOSIS — M6281 Muscle weakness (generalized): Secondary | ICD-10-CM | POA: Diagnosis not present

## 2023-01-06 DIAGNOSIS — R293 Abnormal posture: Secondary | ICD-10-CM | POA: Diagnosis not present

## 2023-01-06 DIAGNOSIS — S72435D Nondisplaced fracture of medial condyle of left femur, subsequent encounter for closed fracture with routine healing: Secondary | ICD-10-CM | POA: Diagnosis not present

## 2023-01-06 DIAGNOSIS — Z741 Need for assistance with personal care: Secondary | ICD-10-CM | POA: Diagnosis not present

## 2023-01-06 DIAGNOSIS — I110 Hypertensive heart disease with heart failure: Secondary | ICD-10-CM | POA: Diagnosis not present

## 2023-01-06 DIAGNOSIS — L97515 Non-pressure chronic ulcer of other part of right foot with muscle involvement without evidence of necrosis: Secondary | ICD-10-CM | POA: Diagnosis not present

## 2023-01-06 DIAGNOSIS — R2681 Unsteadiness on feet: Secondary | ICD-10-CM | POA: Diagnosis not present

## 2023-01-07 DIAGNOSIS — E1169 Type 2 diabetes mellitus with other specified complication: Secondary | ICD-10-CM | POA: Diagnosis not present

## 2023-01-07 DIAGNOSIS — R2689 Other abnormalities of gait and mobility: Secondary | ICD-10-CM | POA: Diagnosis not present

## 2023-01-07 DIAGNOSIS — S72435D Nondisplaced fracture of medial condyle of left femur, subsequent encounter for closed fracture with routine healing: Secondary | ICD-10-CM | POA: Diagnosis not present

## 2023-01-07 DIAGNOSIS — I110 Hypertensive heart disease with heart failure: Secondary | ICD-10-CM | POA: Diagnosis not present

## 2023-01-07 DIAGNOSIS — M6259 Muscle wasting and atrophy, not elsewhere classified, multiple sites: Secondary | ICD-10-CM | POA: Diagnosis not present

## 2023-01-07 DIAGNOSIS — Z741 Need for assistance with personal care: Secondary | ICD-10-CM | POA: Diagnosis not present

## 2023-01-07 DIAGNOSIS — M6281 Muscle weakness (generalized): Secondary | ICD-10-CM | POA: Diagnosis not present

## 2023-01-07 DIAGNOSIS — R293 Abnormal posture: Secondary | ICD-10-CM | POA: Diagnosis not present

## 2023-01-07 DIAGNOSIS — R2681 Unsteadiness on feet: Secondary | ICD-10-CM | POA: Diagnosis not present

## 2023-01-09 DIAGNOSIS — E11621 Type 2 diabetes mellitus with foot ulcer: Secondary | ICD-10-CM | POA: Diagnosis not present

## 2023-01-09 DIAGNOSIS — L97511 Non-pressure chronic ulcer of other part of right foot limited to breakdown of skin: Secondary | ICD-10-CM | POA: Diagnosis not present

## 2023-01-09 DIAGNOSIS — E1140 Type 2 diabetes mellitus with diabetic neuropathy, unspecified: Secondary | ICD-10-CM | POA: Diagnosis not present

## 2023-01-09 DIAGNOSIS — E1169 Type 2 diabetes mellitus with other specified complication: Secondary | ICD-10-CM | POA: Diagnosis not present

## 2023-01-11 DIAGNOSIS — E1169 Type 2 diabetes mellitus with other specified complication: Secondary | ICD-10-CM | POA: Diagnosis not present

## 2023-01-11 DIAGNOSIS — R2681 Unsteadiness on feet: Secondary | ICD-10-CM | POA: Diagnosis not present

## 2023-01-11 DIAGNOSIS — M6259 Muscle wasting and atrophy, not elsewhere classified, multiple sites: Secondary | ICD-10-CM | POA: Diagnosis not present

## 2023-01-11 DIAGNOSIS — M6281 Muscle weakness (generalized): Secondary | ICD-10-CM | POA: Diagnosis not present

## 2023-01-11 DIAGNOSIS — S72435D Nondisplaced fracture of medial condyle of left femur, subsequent encounter for closed fracture with routine healing: Secondary | ICD-10-CM | POA: Diagnosis not present

## 2023-01-11 DIAGNOSIS — R293 Abnormal posture: Secondary | ICD-10-CM | POA: Diagnosis not present

## 2023-01-11 DIAGNOSIS — R2689 Other abnormalities of gait and mobility: Secondary | ICD-10-CM | POA: Diagnosis not present

## 2023-01-11 DIAGNOSIS — I110 Hypertensive heart disease with heart failure: Secondary | ICD-10-CM | POA: Diagnosis not present

## 2023-01-11 DIAGNOSIS — Z741 Need for assistance with personal care: Secondary | ICD-10-CM | POA: Diagnosis not present

## 2023-01-13 DIAGNOSIS — R293 Abnormal posture: Secondary | ICD-10-CM | POA: Diagnosis not present

## 2023-01-13 DIAGNOSIS — Z741 Need for assistance with personal care: Secondary | ICD-10-CM | POA: Diagnosis not present

## 2023-01-13 DIAGNOSIS — S72435D Nondisplaced fracture of medial condyle of left femur, subsequent encounter for closed fracture with routine healing: Secondary | ICD-10-CM | POA: Diagnosis not present

## 2023-01-13 DIAGNOSIS — I110 Hypertensive heart disease with heart failure: Secondary | ICD-10-CM | POA: Diagnosis not present

## 2023-01-13 DIAGNOSIS — R2689 Other abnormalities of gait and mobility: Secondary | ICD-10-CM | POA: Diagnosis not present

## 2023-01-13 DIAGNOSIS — M6259 Muscle wasting and atrophy, not elsewhere classified, multiple sites: Secondary | ICD-10-CM | POA: Diagnosis not present

## 2023-01-13 DIAGNOSIS — R2681 Unsteadiness on feet: Secondary | ICD-10-CM | POA: Diagnosis not present

## 2023-01-13 DIAGNOSIS — E1169 Type 2 diabetes mellitus with other specified complication: Secondary | ICD-10-CM | POA: Diagnosis not present

## 2023-01-13 DIAGNOSIS — M6281 Muscle weakness (generalized): Secondary | ICD-10-CM | POA: Diagnosis not present

## 2023-01-16 DIAGNOSIS — R278 Other lack of coordination: Secondary | ICD-10-CM | POA: Diagnosis not present

## 2023-01-16 DIAGNOSIS — Z794 Long term (current) use of insulin: Secondary | ICD-10-CM | POA: Diagnosis not present

## 2023-01-16 DIAGNOSIS — R1311 Dysphagia, oral phase: Secondary | ICD-10-CM | POA: Diagnosis not present

## 2023-01-16 DIAGNOSIS — L84 Corns and callosities: Secondary | ICD-10-CM | POA: Diagnosis not present

## 2023-01-16 DIAGNOSIS — E1151 Type 2 diabetes mellitus with diabetic peripheral angiopathy without gangrene: Secondary | ICD-10-CM | POA: Diagnosis not present

## 2023-01-16 DIAGNOSIS — S72435D Nondisplaced fracture of medial condyle of left femur, subsequent encounter for closed fracture with routine healing: Secondary | ICD-10-CM | POA: Diagnosis not present

## 2023-01-16 DIAGNOSIS — L603 Nail dystrophy: Secondary | ICD-10-CM | POA: Diagnosis not present

## 2023-01-16 DIAGNOSIS — L602 Onychogryphosis: Secondary | ICD-10-CM | POA: Diagnosis not present

## 2023-01-17 DIAGNOSIS — Z741 Need for assistance with personal care: Secondary | ICD-10-CM | POA: Diagnosis not present

## 2023-01-17 DIAGNOSIS — S72435D Nondisplaced fracture of medial condyle of left femur, subsequent encounter for closed fracture with routine healing: Secondary | ICD-10-CM | POA: Diagnosis not present

## 2023-01-17 DIAGNOSIS — E1169 Type 2 diabetes mellitus with other specified complication: Secondary | ICD-10-CM | POA: Diagnosis not present

## 2023-01-17 DIAGNOSIS — R2689 Other abnormalities of gait and mobility: Secondary | ICD-10-CM | POA: Diagnosis not present

## 2023-01-17 DIAGNOSIS — M6281 Muscle weakness (generalized): Secondary | ICD-10-CM | POA: Diagnosis not present

## 2023-01-17 DIAGNOSIS — I110 Hypertensive heart disease with heart failure: Secondary | ICD-10-CM | POA: Diagnosis not present

## 2023-01-17 DIAGNOSIS — R293 Abnormal posture: Secondary | ICD-10-CM | POA: Diagnosis not present

## 2023-01-17 DIAGNOSIS — R2681 Unsteadiness on feet: Secondary | ICD-10-CM | POA: Diagnosis not present

## 2023-01-17 DIAGNOSIS — M6259 Muscle wasting and atrophy, not elsewhere classified, multiple sites: Secondary | ICD-10-CM | POA: Diagnosis not present

## 2023-01-18 DIAGNOSIS — R1311 Dysphagia, oral phase: Secondary | ICD-10-CM | POA: Diagnosis not present

## 2023-01-18 DIAGNOSIS — S72435D Nondisplaced fracture of medial condyle of left femur, subsequent encounter for closed fracture with routine healing: Secondary | ICD-10-CM | POA: Diagnosis not present

## 2023-01-18 DIAGNOSIS — R278 Other lack of coordination: Secondary | ICD-10-CM | POA: Diagnosis not present

## 2023-01-20 ENCOUNTER — Encounter: Payer: Self-pay | Admitting: Psychiatry

## 2023-01-20 ENCOUNTER — Ambulatory Visit (INDEPENDENT_AMBULATORY_CARE_PROVIDER_SITE_OTHER): Payer: Medicare Other | Admitting: Psychiatry

## 2023-01-20 DIAGNOSIS — R278 Other lack of coordination: Secondary | ICD-10-CM | POA: Diagnosis not present

## 2023-01-20 DIAGNOSIS — F422 Mixed obsessional thoughts and acts: Secondary | ICD-10-CM

## 2023-01-20 DIAGNOSIS — S72435D Nondisplaced fracture of medial condyle of left femur, subsequent encounter for closed fracture with routine healing: Secondary | ICD-10-CM | POA: Diagnosis not present

## 2023-01-20 DIAGNOSIS — R1311 Dysphagia, oral phase: Secondary | ICD-10-CM | POA: Diagnosis not present

## 2023-01-20 DIAGNOSIS — G301 Alzheimer's disease with late onset: Secondary | ICD-10-CM

## 2023-01-20 DIAGNOSIS — F200 Paranoid schizophrenia: Secondary | ICD-10-CM

## 2023-01-20 DIAGNOSIS — F411 Generalized anxiety disorder: Secondary | ICD-10-CM

## 2023-01-20 DIAGNOSIS — F02818 Dementia in other diseases classified elsewhere, unspecified severity, with other behavioral disturbance: Secondary | ICD-10-CM

## 2023-01-20 DIAGNOSIS — F5105 Insomnia due to other mental disorder: Secondary | ICD-10-CM

## 2023-01-20 NOTE — Progress Notes (Signed)
[name] [id number] [birth year] 82 y.o.     Subjective:   Patient ID:  Ricky [name]. is a 82 y.o. (DOB [birth year]) male.  Chief Complaint:  Chief Complaint  Patient presents with   Follow-up   Depression   Anxiety   Hallucinations   Sleeping Problem    Depression        Associated symptoms include decreased concentration and fatigue.  Associated symptoms include no headaches and no suicidal ideas.  Past medical history includes anxiety.   Anxiety Symptoms include confusion, decreased concentration, dizziness and nervous/anxious behavior. Patient reports no chest pain, palpitations or suicidal ideas.     Ricky [name]. presents to the office today for follow-up of schizophrenia and dementia.  Resident of [facility].  seen April 15, 2019.  No meds were changed.  As of 06/03/2019 the following is noted: Staff reports patient calls very frequently and has for an extended period of time.  He is chronically somewhat needy and lonely as well as has a tendency for reassurance checking. Still bothered by religious thoughts and can't tell if it's from God than or Satan.  Supernatural things get in his mind.  Also thinks that people there don't like him at [facility].  Also upset at Biden's spending excess.  Wants to vote for [employer].  Also bothered he doesn't get to do much.    Had thoughts he needed to go to the hospital one night for MH reasons but staff encouraged him to wait it out and he felt better the next day. Poor STM. No med changes  9/20/21appt with the following noted: Worries over losing trusted banker.  Worries money is running out.  Worries about getting Covid.  Memory is worse.  Gets obsessive fears about his salvation especially  Listening to [place] radio at times but has chronically worried over it.   Hears voices talking about his mental health and to listen to radio.   Don't have a whole lot of them but enough to get him upset from  time to time.. Not markedly depressed. Plan no med changes  02/21/2020 appointment with the following noted: [place] some noise, ring in his head but not voices. Worries about it. They take good care of me over there.  Listens to radio a lot at night. And doesn't get a lot of sleep at night but will nap daytime. Reads bible but often doesn't understand or remember it. No SE. Calls on [facility] to help him emotionally and spiritually.   LTM good.  More poor STM.    Wonders if it's possible that he'll ever get well.    Watched NFL football yesterday and enjoyed it. Plan: No med changes  07/19/2020 appointment with the following noted: Has had to call couple times after hours bc was ruminating on past issues.  Today feels OK. Recognizes problems with STM but in his heart feels it will get better. Food good at [employer].  People are treating him well except with one man.  [name] avoids him. Still on olanzapine 25 mg HS and fluovxamine 100, zolpidem 5 HS, Xanax 0.25 mg prn. Can't afford caregiver to take him out daily as in the past. Wonders what doc thinks his IQ would have been.   No concerns about med except wants one to make him well.  01/18/2021 appt noted: Still at [facility].  Feels OK about it.   Still ups and downs but overall holding his own.  Doesn't feel  82 yo.  WC bound but can still move himself around independently.  U incontinence. Getting along pretty well with people, but times thinks people don't like him.  Still wonders if he needs psych hospitalization. Notices memory problems. Still worries chronically over his salvation but not continuous and anxiety is no worse. Voices are better but still has intermittent AH.   CO trouble staying asleep but at times can sleep too much No SE noted Doing PT to try and get stronger. Plan: No med changes today. Successfully reduced alprazolam to 0.25 mg HS Continue olanzapine 25 mg HS for psychosis Continue fluvoxamine 100 mg HS for OCD  and anxiety Continue zolpidem 5 mg HS for sleep Consider stopping alprazolam 0.25 mg HS as long as he's sleeping well. Or reducing to 0.125 mg HS.  Dose is already low  07/19/2021 appointment with the following noted: Incontinent of urine in the office. He thinks overall he is doing better.  Not afraid at all.  Some worry.   Realizes he's here for 6 mos followup. But he does recognize some memory problems.  Sometimes has trouble with use of technology. Not much re: voices except hears noises at times that disturb him.   Not depressed.  Enjoys bingo. Asks for reassurance. No med concerns. Plan: No med changes today. Successfully reduced alprazolam to 0.25 mg HS Continue olanzapine 25 mg HS for psychosis Continue fluvoxamine 100 mg HS for OCD and anxiety Continue zolpidem 5 mg HS for sleep Consider stopping alprazolam 0.25 mg HS as long as he's sleeping well. Or reducing to 0.125 mg HS.  Dose is already low  01/17/22 appt noted: Reduced alpraZolam to 0.125 mg nightly and Ambien to 2.5 mg nightly.  He has continued the other medicines.   He's satisfied living at [facility]. "Do you think I've been improved?"  Not depressed.  Worry comes and goes. Listens to relaxing radio show at night. He's aware he is not making frequent calls to the office like he used to do. Acknowledges some memory issues. Not as bothered by [facility].   Some worry that he's run out of his trust fund money.   07/15/22 appt noted: More problems since here.  Had u incontinence in the [facility] today.  This is a common problem.  Not wearing depends.   "It's been a long 6 mos".  Stressed. He is bored often.   Some trouble dealing with people but tries to make the best of it. Psych meds: off alprazolam.  Fluvoxamine 100 HS, olanzapine 25 mg HS. Zolpidem 2.5 mg HS No SE or med concerns. Some chronic difficulty dealing with people bc often thinks they might not like him.  Gets suspicious. Plan: no changes  01/20/23 appt  noted: Psych meds: off alprazolam.  Fluvoxamine 100 HS, olanzapine 25 mg HS. Zolpidem 2.5 mg HS.  Reviewed and confirmed with paperwork from [facility].  Trying to manage DM and complications.   Has a new roommate and doing ok so far.  Roommate asked him to turn off light at night and seems more relaxed with the light off.   Pt aware of age and date.  He feels like he's only about 15-66 years old.  Not dep.  Still struggles with anxiety over dealing with people and the Bible.  He will worry that he is not responding in the right way to the Bible and worries over his faith.   Past Psychiatric Medication Trials:  some of them are unknown,  haloperidol, Serentil, Stelazine,  Moban, perphenazine, risperidone, Thorazine, Seroquel 800 mg, loxapine, been on Zyprexa since September 2006 and that has produced the best control of his paranoia at 25 mg daily. benztropine,    paroxetine, sertraline side effects,   Hydroxyzine, trazodone no response,  Xanax, zolpidem.  Review of Systems:  Review of Systems  Constitutional:  Positive for fatigue.  HENT:  Positive for tinnitus.   Cardiovascular:  Negative for chest pain and palpitations.  Genitourinary:  Positive for enuresis. Negative for flank pain.       Urinary incontinence  Musculoskeletal:  Positive for arthralgias, back pain and gait problem.  Neurological:  Positive for dizziness, tremors and weakness. Negative for headaches.       WC bound  Psychiatric/Behavioral:  Positive for confusion, decreased concentration, hallucinations and sleep disturbance. Negative for agitation, self-injury and suicidal ideas. The patient is nervous/anxious. The patient is not hyperactive.     Medications: I have reviewed the patient's current medications.  Current Outpatient Medications  Medication Sig Dispense Refill   amLODipine (NORVASC) 2.5 MG tablet Take 2.5 mg by mouth daily.     Calcium Citrate-Vitamin D (CITRACAL + D PO) Take 1 tablet by mouth daily.      cholecalciferol (VITAMIN D3) 25 MCG (1000 UNIT) tablet Take 1,000 Units by mouth daily.     docusate sodium (COLACE) 100 MG capsule Take 100 mg by mouth 2 (two) times daily.     empagliflozin (JARDIANCE) 25 MG TABS tablet Take 25 mg by mouth daily.     fluvoxaMINE (LUVOX) 100 MG tablet Take 1 tablet (100 mg total) by mouth at bedtime. 2 tablet 0   furosemide (LASIX) 20 MG tablet Take 10 mg by mouth daily before breakfast.     guaiFENesin (ROBITUSSIN) 100 MG/5ML liquid Take 5 mLs by mouth every 4 (four) hours as needed for cough or to loosen phlegm.     HUMALOG KWIKPEN 100 UNIT/ML KiwkPen Inject 2-11 Units into the skin See admin instructions. Times: 0630 1130 1630  Scale: 101-150: 2 units 151-200: 3 units 201-250: 5 units 251-300: 7 units 301-350: 9 units >350: 11 units     insulin glargine (LANTUS) 100 UNIT/ML Solostar Pen Inject 15 Units into the skin daily.     ipratropium-albuterol (DUONEB) 0.5-2.5 (3) MG/3ML SOLN Take 3 mLs by nebulization every 8 (eight) hours as needed.     mirabegron ER (MYRBETRIQ) 50 MG TB24 tablet Take 50 mg by mouth daily.     Multiple Vitamins-Minerals (THERAGRAN-M PREMIER 50 PLUS PO) Take 1 tablet by mouth daily.     OLANZapine (ZYPREXA) 5 MG tablet Take 5 tablets (25 mg total) by mouth daily. 2 tablet 0   omeprazole (PRILOSEC) 40 MG capsule Take 40 mg by mouth daily.      pravastatin (PRAVACHOL) 40 MG tablet Take 40 mg by mouth at bedtime.     senna-docusate (SENOKOT-S) 8.6-50 MG tablet Take 1 tablet by mouth at bedtime as needed for mild constipation.     tamsulosin (FLOMAX) 0.4 MG CAPS capsule Take 0.4 mg by mouth every evening.     zolpidem (AMBIEN) 5 MG tablet Take 1 tablet (5 mg total) by mouth at bedtime. (Patient taking differently: Take 2.5 mg by mouth at bedtime.) 2 tablet 0   No current facility-administered medications for this visit.    Medication Side Effects: Other: dry mouth  Allergies:  Allergies  Allergen Reactions   Asa  [Aspirin]     "Dr told me not to take it"   Codeine  Other (See Comments)    DIZZINESS with Tylenol 3   Tylenol With Codeine #3 [Acetaminophen-Codeine]     Other reaction(s): Unknown   Tylenol [Acetaminophen] Other (See Comments)    Dizziness with tylenol 3    Past Medical History:  Diagnosis Date   Abnormality of gait    Adenomatous colon polyp    Arthritis    Cataract    Dementia (HCC)    Depression    Diabetes mellitus without complication (HCC)    Fatty liver    Hyperlipidemia    Hypertension    Internal hemorrhoids    Other malaise and fatigue    Peripheral edema    Schizophrenia (HCC)    Type II or unspecified type diabetes mellitus without mention of complication, not stated as uncontrolled    Urinary frequency    Urinary retention     Family History  Problem Relation Age of Onset   Brain cancer Father     Social History   Socioeconomic History   Marital status: Single    Spouse name: Not on file   Number of children: Not on file   Years of education: college   Highest education level: Not on file  Occupational History    Employer: RETIRED    Comment: Disabled  Tobacco Use   Smoking status: Never   Smokeless tobacco: Never  Substance and Sexual Activity   Alcohol use: No   Drug use: No   Sexual activity: Not on file  Other Topics Concern   Not on file  Social History Narrative   Patient is disabled and he has not worked since 1962. Patient has some college education.Patient drinks 2 cups of coffee daily.   Right handed.   Social Drivers of Corporate investment banker Strain: Not on file  Food Insecurity: Patient Unable To Answer (09/24/2022)   Hunger Vital Sign    Worried About Running Out of Food in the Last Year: Patient unable to answer    Ran Out of Food in the Last Year: Patient unable to answer  Transportation Needs: No Transportation Needs (09/24/2022)   PRAPARE - Administrator, Civil Service (Medical): No    Lack of  Transportation (Non-Medical): No  Physical Activity: Not on file  Stress: Not on file  Social Connections: Not on file  Intimate Partner Violence: Not At Risk (09/24/2022)   Humiliation, Afraid, Rape, and Kick questionnaire    Fear of Current or Ex-Partner: No    Emotionally Abused: No    Physically Abused: No    Sexually Abused: No    Past Medical History, Surgical history, Social history, and Family history were reviewed and updated as appropriate.   Please see review of systems for further details on the patient's review from today.   Objective:   Physical Exam:  There were no vitals taken for this visit.  Physical Exam Constitutional:      Appearance: He is obese.  Neurological:     Mental Status: He is alert and oriented to person, place, and time.     Cranial Nerves: Dysarthria present.     Motor: Weakness present.     Gait: Gait abnormal.     Comments: Mild dysarthria chronic   Psychiatric:        Attention and Perception: Attention normal. He is attentive. He perceives auditory hallucinations. He does not perceive visual hallucinations.        Mood and Affect: Mood is anxious. Mood is not  depressed. Affect is not angry or tearful.        Speech: Speech normal. Speech is not rapid and pressured or slurred.        Behavior: Behavior is slowed. Behavior is not agitated or aggressive. Behavior is cooperative.        Thought Content: Thought content is paranoid and delusional. Thought content does not include homicidal or suicidal ideation. Thought content does not include homicidal or suicidal plan.        Cognition and Memory: Cognition is impaired. Memory is impaired. He does not exhibit impaired recent memory.     Comments: Chronic voices better. AH is just a noise not voice when he does something he feels guilty about. Fair insight and judgment. Talkative and repeats himself some. Memory is stable.Ulyses Jarred words at times but decent fund of knowledge. Reassurance seeking  chronically unchanged.   Some IOR re commericials on TV ongoing not disturbing. Obsessive spiritual thought and compulsive behaviors are less prominent  residual paranoid Affect calm.      Lab Review:     Component Value Date/Time   NA 136 09/26/2022 0128   K 3.8 09/26/2022 0128   CL 100 09/26/2022 0128   CO2 25 09/26/2022 0128   GLUCOSE 186 (H) 09/26/2022 0128   BUN 17 09/26/2022 0128   CREATININE 0.97 09/26/2022 0128   CALCIUM 8.6 (L) 09/26/2022 0128   PROT 7.3 09/23/2022 1730   ALBUMIN 2.9 (L) 09/23/2022 1730   AST 18 09/23/2022 1730   ALT 19 09/23/2022 1730   ALKPHOS 93 09/23/2022 1730   BILITOT 0.6 09/23/2022 1730   GFRNONAA >60 09/26/2022 0128   GFRAA >60 01/22/2018 0339       Component Value Date/Time   WBC 13.6 (H) 09/26/2022 0128   RBC 3.61 (L) 09/26/2022 0128   HGB 10.9 (L) 09/26/2022 0128   HCT 35.3 (L) 09/26/2022 0128   PLT 215 09/26/2022 0128   MCV 97.8 09/26/2022 0128   MCH 30.2 09/26/2022 0128   MCHC 30.9 09/26/2022 0128   RDW 14.6 09/26/2022 0128   LYMPHSABS 0.7 09/23/2022 1730   MONOABS 0.8 09/23/2022 1730   EOSABS 0.0 09/23/2022 1730   BASOSABS 0.0 09/23/2022 1730    No results found for: "POCLITH", "LITHIUM"   No results found for: "PHENYTOIN", "PHENOBARB", "VALPROATE", "CBMZ"   .res Assessment: Plan:    N[place] "[name]" was seen today for follow-up, depression, anxiety, hallucinations and sleeping problem.  Diagnoses and all orders for this visit:  Schizophrenia, paranoid (HCC)  Mixed obsessional thoughts and acts  Generalized anxiety disorder  Late onset Alzheimer's disease with behavioral disturbance (HCC)  Insomnia due to mental condition    30 min face to face time with patient was spent on counseling and coordination of care. We discussed several topics as noted:  Patient has been under my psychiatric years since 1998 .unlikely that med change will reduce the paranoia and auditory hallucinations which are chronic. Voices  are better and paranoia appears a little better but varies.  Reports still cooperative with staff.  He has been on multiple psychiatric medications and has done best on this combination of Zyprexa which was increased to 25 mg April 09, 2018, , , and fluvoxamine 100 mg daily. Is cooperative genereally.   He also still has obsessive and intrusive fears about losing his salvation but they are chronic but less severe over time.  Given his age and the chronicity of the symptoms med changes are unlikely to help.  Chronic  reassurance seeking.  Calls people for reassurance. Overall also paranoia is better but not severe.  Needs high dose olanzapine.    Supportive therapy dealing with chronic paranoia.  Encourage his spirituality as a coping mechanism.  Needs a lot of encouragement.  Repetitively asks for reassurance.  He is making fewer after hours phone calls to our office than historically has been done.  Does not look like any in the last few months.    Weaker and needing more physical assistance.  WC bound  .  Looks more debilitated.    Discussed potential metabolic side effects associated with atypical antipsychotics, as well as potential risk for movement side effects. Advised pt to contact office if movement side effects occur.   Disc he's taking more than the usuall highest dose of olanzapine but is medically necessary for paranoia. No AIM.  successfully DC alprazolam  Continue olanzapine 25 mg HS for psychosis Continue fluvoxamine 100 mg HS for OCD and anxiety Continue zolpidem 2.5 mg HS for sleep.  If he gets the olanzapine 2-3 hours before  bedtime he might be able to stop this too.  30 min appt  FU 6 mos  Meredith Staggers, MD, DFAPA .    No future appointments.   No orders of the defined types were placed in this encounter.      -------------------------------

## 2023-01-21 DIAGNOSIS — R1311 Dysphagia, oral phase: Secondary | ICD-10-CM | POA: Diagnosis not present

## 2023-01-21 DIAGNOSIS — S72435D Nondisplaced fracture of medial condyle of left femur, subsequent encounter for closed fracture with routine healing: Secondary | ICD-10-CM | POA: Diagnosis not present

## 2023-01-21 DIAGNOSIS — R278 Other lack of coordination: Secondary | ICD-10-CM | POA: Diagnosis not present

## 2023-01-22 DIAGNOSIS — R1311 Dysphagia, oral phase: Secondary | ICD-10-CM | POA: Diagnosis not present

## 2023-01-22 DIAGNOSIS — R278 Other lack of coordination: Secondary | ICD-10-CM | POA: Diagnosis not present

## 2023-01-22 DIAGNOSIS — S72435D Nondisplaced fracture of medial condyle of left femur, subsequent encounter for closed fracture with routine healing: Secondary | ICD-10-CM | POA: Diagnosis not present

## 2023-01-23 DIAGNOSIS — R1311 Dysphagia, oral phase: Secondary | ICD-10-CM | POA: Diagnosis not present

## 2023-01-23 DIAGNOSIS — R278 Other lack of coordination: Secondary | ICD-10-CM | POA: Diagnosis not present

## 2023-01-23 DIAGNOSIS — S72435D Nondisplaced fracture of medial condyle of left femur, subsequent encounter for closed fracture with routine healing: Secondary | ICD-10-CM | POA: Diagnosis not present

## 2023-01-24 DIAGNOSIS — S72435D Nondisplaced fracture of medial condyle of left femur, subsequent encounter for closed fracture with routine healing: Secondary | ICD-10-CM | POA: Diagnosis not present

## 2023-01-24 DIAGNOSIS — R1311 Dysphagia, oral phase: Secondary | ICD-10-CM | POA: Diagnosis not present

## 2023-01-24 DIAGNOSIS — R278 Other lack of coordination: Secondary | ICD-10-CM | POA: Diagnosis not present

## 2023-01-27 DIAGNOSIS — S72435D Nondisplaced fracture of medial condyle of left femur, subsequent encounter for closed fracture with routine healing: Secondary | ICD-10-CM | POA: Diagnosis not present

## 2023-01-27 DIAGNOSIS — R1311 Dysphagia, oral phase: Secondary | ICD-10-CM | POA: Diagnosis not present

## 2023-01-27 DIAGNOSIS — I110 Hypertensive heart disease with heart failure: Secondary | ICD-10-CM | POA: Diagnosis not present

## 2023-01-27 DIAGNOSIS — Z741 Need for assistance with personal care: Secondary | ICD-10-CM | POA: Diagnosis not present

## 2023-01-27 DIAGNOSIS — R278 Other lack of coordination: Secondary | ICD-10-CM | POA: Diagnosis not present

## 2023-01-27 DIAGNOSIS — M6281 Muscle weakness (generalized): Secondary | ICD-10-CM | POA: Diagnosis not present

## 2023-01-27 DIAGNOSIS — E1169 Type 2 diabetes mellitus with other specified complication: Secondary | ICD-10-CM | POA: Diagnosis not present

## 2023-01-27 DIAGNOSIS — R293 Abnormal posture: Secondary | ICD-10-CM | POA: Diagnosis not present

## 2023-01-27 DIAGNOSIS — R2681 Unsteadiness on feet: Secondary | ICD-10-CM | POA: Diagnosis not present

## 2023-01-27 DIAGNOSIS — R2689 Other abnormalities of gait and mobility: Secondary | ICD-10-CM | POA: Diagnosis not present

## 2023-01-27 DIAGNOSIS — M6259 Muscle wasting and atrophy, not elsewhere classified, multiple sites: Secondary | ICD-10-CM | POA: Diagnosis not present

## 2023-01-28 DIAGNOSIS — R278 Other lack of coordination: Secondary | ICD-10-CM | POA: Diagnosis not present

## 2023-01-28 DIAGNOSIS — R1311 Dysphagia, oral phase: Secondary | ICD-10-CM | POA: Diagnosis not present

## 2023-01-28 DIAGNOSIS — S72435D Nondisplaced fracture of medial condyle of left femur, subsequent encounter for closed fracture with routine healing: Secondary | ICD-10-CM | POA: Diagnosis not present

## 2023-01-29 DIAGNOSIS — S72435D Nondisplaced fracture of medial condyle of left femur, subsequent encounter for closed fracture with routine healing: Secondary | ICD-10-CM | POA: Diagnosis not present

## 2023-01-29 DIAGNOSIS — R1311 Dysphagia, oral phase: Secondary | ICD-10-CM | POA: Diagnosis not present

## 2023-01-29 DIAGNOSIS — R278 Other lack of coordination: Secondary | ICD-10-CM | POA: Diagnosis not present

## 2023-01-31 DIAGNOSIS — R278 Other lack of coordination: Secondary | ICD-10-CM | POA: Diagnosis not present

## 2023-01-31 DIAGNOSIS — S72435D Nondisplaced fracture of medial condyle of left femur, subsequent encounter for closed fracture with routine healing: Secondary | ICD-10-CM | POA: Diagnosis not present

## 2023-01-31 DIAGNOSIS — R1311 Dysphagia, oral phase: Secondary | ICD-10-CM | POA: Diagnosis not present

## 2023-02-03 DIAGNOSIS — R1311 Dysphagia, oral phase: Secondary | ICD-10-CM | POA: Diagnosis not present

## 2023-02-03 DIAGNOSIS — S72435D Nondisplaced fracture of medial condyle of left femur, subsequent encounter for closed fracture with routine healing: Secondary | ICD-10-CM | POA: Diagnosis not present

## 2023-02-03 DIAGNOSIS — R278 Other lack of coordination: Secondary | ICD-10-CM | POA: Diagnosis not present

## 2023-02-04 DIAGNOSIS — I110 Hypertensive heart disease with heart failure: Secondary | ICD-10-CM | POA: Diagnosis not present

## 2023-02-04 DIAGNOSIS — R1311 Dysphagia, oral phase: Secondary | ICD-10-CM | POA: Diagnosis not present

## 2023-02-04 DIAGNOSIS — M6281 Muscle weakness (generalized): Secondary | ICD-10-CM | POA: Diagnosis not present

## 2023-02-04 DIAGNOSIS — R2689 Other abnormalities of gait and mobility: Secondary | ICD-10-CM | POA: Diagnosis not present

## 2023-02-04 DIAGNOSIS — R278 Other lack of coordination: Secondary | ICD-10-CM | POA: Diagnosis not present

## 2023-02-04 DIAGNOSIS — M6259 Muscle wasting and atrophy, not elsewhere classified, multiple sites: Secondary | ICD-10-CM | POA: Diagnosis not present

## 2023-02-04 DIAGNOSIS — E1169 Type 2 diabetes mellitus with other specified complication: Secondary | ICD-10-CM | POA: Diagnosis not present

## 2023-02-04 DIAGNOSIS — R2681 Unsteadiness on feet: Secondary | ICD-10-CM | POA: Diagnosis not present

## 2023-02-04 DIAGNOSIS — S72435D Nondisplaced fracture of medial condyle of left femur, subsequent encounter for closed fracture with routine healing: Secondary | ICD-10-CM | POA: Diagnosis not present

## 2023-02-04 DIAGNOSIS — R293 Abnormal posture: Secondary | ICD-10-CM | POA: Diagnosis not present

## 2023-02-04 DIAGNOSIS — Z741 Need for assistance with personal care: Secondary | ICD-10-CM | POA: Diagnosis not present

## 2023-02-05 DIAGNOSIS — R278 Other lack of coordination: Secondary | ICD-10-CM | POA: Diagnosis not present

## 2023-02-05 DIAGNOSIS — Z741 Need for assistance with personal care: Secondary | ICD-10-CM | POA: Diagnosis not present

## 2023-02-05 DIAGNOSIS — R2689 Other abnormalities of gait and mobility: Secondary | ICD-10-CM | POA: Diagnosis not present

## 2023-02-05 DIAGNOSIS — R293 Abnormal posture: Secondary | ICD-10-CM | POA: Diagnosis not present

## 2023-02-05 DIAGNOSIS — M6281 Muscle weakness (generalized): Secondary | ICD-10-CM | POA: Diagnosis not present

## 2023-02-05 DIAGNOSIS — R2681 Unsteadiness on feet: Secondary | ICD-10-CM | POA: Diagnosis not present

## 2023-02-05 DIAGNOSIS — M6259 Muscle wasting and atrophy, not elsewhere classified, multiple sites: Secondary | ICD-10-CM | POA: Diagnosis not present

## 2023-02-05 DIAGNOSIS — S72435D Nondisplaced fracture of medial condyle of left femur, subsequent encounter for closed fracture with routine healing: Secondary | ICD-10-CM | POA: Diagnosis not present

## 2023-02-05 DIAGNOSIS — I110 Hypertensive heart disease with heart failure: Secondary | ICD-10-CM | POA: Diagnosis not present

## 2023-02-05 DIAGNOSIS — E1169 Type 2 diabetes mellitus with other specified complication: Secondary | ICD-10-CM | POA: Diagnosis not present

## 2023-02-05 DIAGNOSIS — R1311 Dysphagia, oral phase: Secondary | ICD-10-CM | POA: Diagnosis not present

## 2023-02-06 DIAGNOSIS — R1311 Dysphagia, oral phase: Secondary | ICD-10-CM | POA: Diagnosis not present

## 2023-02-06 DIAGNOSIS — R278 Other lack of coordination: Secondary | ICD-10-CM | POA: Diagnosis not present

## 2023-02-06 DIAGNOSIS — S72435D Nondisplaced fracture of medial condyle of left femur, subsequent encounter for closed fracture with routine healing: Secondary | ICD-10-CM | POA: Diagnosis not present

## 2023-02-07 DIAGNOSIS — E119 Type 2 diabetes mellitus without complications: Secondary | ICD-10-CM | POA: Diagnosis not present

## 2023-02-08 DIAGNOSIS — R278 Other lack of coordination: Secondary | ICD-10-CM | POA: Diagnosis not present

## 2023-02-08 DIAGNOSIS — R1311 Dysphagia, oral phase: Secondary | ICD-10-CM | POA: Diagnosis not present

## 2023-02-08 DIAGNOSIS — S72435D Nondisplaced fracture of medial condyle of left femur, subsequent encounter for closed fracture with routine healing: Secondary | ICD-10-CM | POA: Diagnosis not present

## 2023-02-10 DIAGNOSIS — S72435D Nondisplaced fracture of medial condyle of left femur, subsequent encounter for closed fracture with routine healing: Secondary | ICD-10-CM | POA: Diagnosis not present

## 2023-02-10 DIAGNOSIS — R278 Other lack of coordination: Secondary | ICD-10-CM | POA: Diagnosis not present

## 2023-02-10 DIAGNOSIS — R1311 Dysphagia, oral phase: Secondary | ICD-10-CM | POA: Diagnosis not present

## 2023-02-11 DIAGNOSIS — I110 Hypertensive heart disease with heart failure: Secondary | ICD-10-CM | POA: Diagnosis not present

## 2023-02-11 DIAGNOSIS — R278 Other lack of coordination: Secondary | ICD-10-CM | POA: Diagnosis not present

## 2023-02-11 DIAGNOSIS — M6259 Muscle wasting and atrophy, not elsewhere classified, multiple sites: Secondary | ICD-10-CM | POA: Diagnosis not present

## 2023-02-11 DIAGNOSIS — R2689 Other abnormalities of gait and mobility: Secondary | ICD-10-CM | POA: Diagnosis not present

## 2023-02-11 DIAGNOSIS — S72435D Nondisplaced fracture of medial condyle of left femur, subsequent encounter for closed fracture with routine healing: Secondary | ICD-10-CM | POA: Diagnosis not present

## 2023-02-11 DIAGNOSIS — M6281 Muscle weakness (generalized): Secondary | ICD-10-CM | POA: Diagnosis not present

## 2023-02-11 DIAGNOSIS — Z741 Need for assistance with personal care: Secondary | ICD-10-CM | POA: Diagnosis not present

## 2023-02-11 DIAGNOSIS — R293 Abnormal posture: Secondary | ICD-10-CM | POA: Diagnosis not present

## 2023-02-11 DIAGNOSIS — R1311 Dysphagia, oral phase: Secondary | ICD-10-CM | POA: Diagnosis not present

## 2023-02-11 DIAGNOSIS — R2681 Unsteadiness on feet: Secondary | ICD-10-CM | POA: Diagnosis not present

## 2023-02-11 DIAGNOSIS — E1169 Type 2 diabetes mellitus with other specified complication: Secondary | ICD-10-CM | POA: Diagnosis not present

## 2023-02-12 DIAGNOSIS — R278 Other lack of coordination: Secondary | ICD-10-CM | POA: Diagnosis not present

## 2023-02-12 DIAGNOSIS — S72435D Nondisplaced fracture of medial condyle of left femur, subsequent encounter for closed fracture with routine healing: Secondary | ICD-10-CM | POA: Diagnosis not present

## 2023-02-12 DIAGNOSIS — R1311 Dysphagia, oral phase: Secondary | ICD-10-CM | POA: Diagnosis not present

## 2023-02-13 DIAGNOSIS — R1311 Dysphagia, oral phase: Secondary | ICD-10-CM | POA: Diagnosis not present

## 2023-02-13 DIAGNOSIS — R278 Other lack of coordination: Secondary | ICD-10-CM | POA: Diagnosis not present

## 2023-02-13 DIAGNOSIS — S72435D Nondisplaced fracture of medial condyle of left femur, subsequent encounter for closed fracture with routine healing: Secondary | ICD-10-CM | POA: Diagnosis not present

## 2023-02-17 DIAGNOSIS — R278 Other lack of coordination: Secondary | ICD-10-CM | POA: Diagnosis not present

## 2023-02-17 DIAGNOSIS — R1311 Dysphagia, oral phase: Secondary | ICD-10-CM | POA: Diagnosis not present

## 2023-02-17 DIAGNOSIS — S72435D Nondisplaced fracture of medial condyle of left femur, subsequent encounter for closed fracture with routine healing: Secondary | ICD-10-CM | POA: Diagnosis not present

## 2023-02-18 DIAGNOSIS — R1311 Dysphagia, oral phase: Secondary | ICD-10-CM | POA: Diagnosis not present

## 2023-02-18 DIAGNOSIS — S72435D Nondisplaced fracture of medial condyle of left femur, subsequent encounter for closed fracture with routine healing: Secondary | ICD-10-CM | POA: Diagnosis not present

## 2023-02-18 DIAGNOSIS — R278 Other lack of coordination: Secondary | ICD-10-CM | POA: Diagnosis not present

## 2023-02-19 DIAGNOSIS — S72435D Nondisplaced fracture of medial condyle of left femur, subsequent encounter for closed fracture with routine healing: Secondary | ICD-10-CM | POA: Diagnosis not present

## 2023-02-19 DIAGNOSIS — R2689 Other abnormalities of gait and mobility: Secondary | ICD-10-CM | POA: Diagnosis not present

## 2023-02-19 DIAGNOSIS — R1311 Dysphagia, oral phase: Secondary | ICD-10-CM | POA: Diagnosis not present

## 2023-02-19 DIAGNOSIS — R2681 Unsteadiness on feet: Secondary | ICD-10-CM | POA: Diagnosis not present

## 2023-02-19 DIAGNOSIS — E1169 Type 2 diabetes mellitus with other specified complication: Secondary | ICD-10-CM | POA: Diagnosis not present

## 2023-02-19 DIAGNOSIS — I110 Hypertensive heart disease with heart failure: Secondary | ICD-10-CM | POA: Diagnosis not present

## 2023-02-19 DIAGNOSIS — R293 Abnormal posture: Secondary | ICD-10-CM | POA: Diagnosis not present

## 2023-02-19 DIAGNOSIS — Z741 Need for assistance with personal care: Secondary | ICD-10-CM | POA: Diagnosis not present

## 2023-02-19 DIAGNOSIS — R278 Other lack of coordination: Secondary | ICD-10-CM | POA: Diagnosis not present

## 2023-02-19 DIAGNOSIS — M6259 Muscle wasting and atrophy, not elsewhere classified, multiple sites: Secondary | ICD-10-CM | POA: Diagnosis not present

## 2023-02-19 DIAGNOSIS — M6281 Muscle weakness (generalized): Secondary | ICD-10-CM | POA: Diagnosis not present

## 2023-02-20 DIAGNOSIS — S72435D Nondisplaced fracture of medial condyle of left femur, subsequent encounter for closed fracture with routine healing: Secondary | ICD-10-CM | POA: Diagnosis not present

## 2023-02-20 DIAGNOSIS — R278 Other lack of coordination: Secondary | ICD-10-CM | POA: Diagnosis not present

## 2023-02-20 DIAGNOSIS — R1311 Dysphagia, oral phase: Secondary | ICD-10-CM | POA: Diagnosis not present

## 2023-02-21 DIAGNOSIS — R1311 Dysphagia, oral phase: Secondary | ICD-10-CM | POA: Diagnosis not present

## 2023-02-21 DIAGNOSIS — S72435D Nondisplaced fracture of medial condyle of left femur, subsequent encounter for closed fracture with routine healing: Secondary | ICD-10-CM | POA: Diagnosis not present

## 2023-02-21 DIAGNOSIS — E1169 Type 2 diabetes mellitus with other specified complication: Secondary | ICD-10-CM | POA: Diagnosis not present

## 2023-02-21 DIAGNOSIS — M6281 Muscle weakness (generalized): Secondary | ICD-10-CM | POA: Diagnosis not present

## 2023-02-21 DIAGNOSIS — R278 Other lack of coordination: Secondary | ICD-10-CM | POA: Diagnosis not present

## 2023-02-21 DIAGNOSIS — I110 Hypertensive heart disease with heart failure: Secondary | ICD-10-CM | POA: Diagnosis not present

## 2023-02-21 DIAGNOSIS — M6259 Muscle wasting and atrophy, not elsewhere classified, multiple sites: Secondary | ICD-10-CM | POA: Diagnosis not present

## 2023-02-21 DIAGNOSIS — R2681 Unsteadiness on feet: Secondary | ICD-10-CM | POA: Diagnosis not present

## 2023-02-21 DIAGNOSIS — Z741 Need for assistance with personal care: Secondary | ICD-10-CM | POA: Diagnosis not present

## 2023-02-21 DIAGNOSIS — R2689 Other abnormalities of gait and mobility: Secondary | ICD-10-CM | POA: Diagnosis not present

## 2023-02-21 DIAGNOSIS — R293 Abnormal posture: Secondary | ICD-10-CM | POA: Diagnosis not present

## 2023-02-24 DIAGNOSIS — R278 Other lack of coordination: Secondary | ICD-10-CM | POA: Diagnosis not present

## 2023-02-24 DIAGNOSIS — S72435D Nondisplaced fracture of medial condyle of left femur, subsequent encounter for closed fracture with routine healing: Secondary | ICD-10-CM | POA: Diagnosis not present

## 2023-02-24 DIAGNOSIS — R1311 Dysphagia, oral phase: Secondary | ICD-10-CM | POA: Diagnosis not present

## 2023-02-25 DIAGNOSIS — R1311 Dysphagia, oral phase: Secondary | ICD-10-CM | POA: Diagnosis not present

## 2023-02-25 DIAGNOSIS — R278 Other lack of coordination: Secondary | ICD-10-CM | POA: Diagnosis not present

## 2023-02-25 DIAGNOSIS — S72435D Nondisplaced fracture of medial condyle of left femur, subsequent encounter for closed fracture with routine healing: Secondary | ICD-10-CM | POA: Diagnosis not present

## 2023-02-26 DIAGNOSIS — R278 Other lack of coordination: Secondary | ICD-10-CM | POA: Diagnosis not present

## 2023-02-26 DIAGNOSIS — R1311 Dysphagia, oral phase: Secondary | ICD-10-CM | POA: Diagnosis not present

## 2023-02-26 DIAGNOSIS — S72435D Nondisplaced fracture of medial condyle of left femur, subsequent encounter for closed fracture with routine healing: Secondary | ICD-10-CM | POA: Diagnosis not present

## 2023-02-27 DIAGNOSIS — R278 Other lack of coordination: Secondary | ICD-10-CM | POA: Diagnosis not present

## 2023-02-27 DIAGNOSIS — S72435D Nondisplaced fracture of medial condyle of left femur, subsequent encounter for closed fracture with routine healing: Secondary | ICD-10-CM | POA: Diagnosis not present

## 2023-02-27 DIAGNOSIS — R1311 Dysphagia, oral phase: Secondary | ICD-10-CM | POA: Diagnosis not present

## 2023-02-28 DIAGNOSIS — M6259 Muscle wasting and atrophy, not elsewhere classified, multiple sites: Secondary | ICD-10-CM | POA: Diagnosis not present

## 2023-02-28 DIAGNOSIS — R2689 Other abnormalities of gait and mobility: Secondary | ICD-10-CM | POA: Diagnosis not present

## 2023-02-28 DIAGNOSIS — R1311 Dysphagia, oral phase: Secondary | ICD-10-CM | POA: Diagnosis not present

## 2023-02-28 DIAGNOSIS — I110 Hypertensive heart disease with heart failure: Secondary | ICD-10-CM | POA: Diagnosis not present

## 2023-02-28 DIAGNOSIS — R278 Other lack of coordination: Secondary | ICD-10-CM | POA: Diagnosis not present

## 2023-02-28 DIAGNOSIS — R2681 Unsteadiness on feet: Secondary | ICD-10-CM | POA: Diagnosis not present

## 2023-02-28 DIAGNOSIS — R293 Abnormal posture: Secondary | ICD-10-CM | POA: Diagnosis not present

## 2023-02-28 DIAGNOSIS — Z741 Need for assistance with personal care: Secondary | ICD-10-CM | POA: Diagnosis not present

## 2023-02-28 DIAGNOSIS — E1169 Type 2 diabetes mellitus with other specified complication: Secondary | ICD-10-CM | POA: Diagnosis not present

## 2023-02-28 DIAGNOSIS — S72435D Nondisplaced fracture of medial condyle of left femur, subsequent encounter for closed fracture with routine healing: Secondary | ICD-10-CM | POA: Diagnosis not present

## 2023-02-28 DIAGNOSIS — M6281 Muscle weakness (generalized): Secondary | ICD-10-CM | POA: Diagnosis not present

## 2023-03-09 DIAGNOSIS — R2681 Unsteadiness on feet: Secondary | ICD-10-CM | POA: Diagnosis not present

## 2023-03-09 DIAGNOSIS — M6281 Muscle weakness (generalized): Secondary | ICD-10-CM | POA: Diagnosis not present

## 2023-03-09 DIAGNOSIS — R293 Abnormal posture: Secondary | ICD-10-CM | POA: Diagnosis not present

## 2023-03-09 DIAGNOSIS — R2689 Other abnormalities of gait and mobility: Secondary | ICD-10-CM | POA: Diagnosis not present

## 2023-03-09 DIAGNOSIS — Z741 Need for assistance with personal care: Secondary | ICD-10-CM | POA: Diagnosis not present

## 2023-03-09 DIAGNOSIS — I110 Hypertensive heart disease with heart failure: Secondary | ICD-10-CM | POA: Diagnosis not present

## 2023-03-09 DIAGNOSIS — E1169 Type 2 diabetes mellitus with other specified complication: Secondary | ICD-10-CM | POA: Diagnosis not present

## 2023-03-09 DIAGNOSIS — S72435D Nondisplaced fracture of medial condyle of left femur, subsequent encounter for closed fracture with routine healing: Secondary | ICD-10-CM | POA: Diagnosis not present

## 2023-03-09 DIAGNOSIS — M6259 Muscle wasting and atrophy, not elsewhere classified, multiple sites: Secondary | ICD-10-CM | POA: Diagnosis not present

## 2023-03-16 DIAGNOSIS — S72435D Nondisplaced fracture of medial condyle of left femur, subsequent encounter for closed fracture with routine healing: Secondary | ICD-10-CM | POA: Diagnosis not present

## 2023-03-16 DIAGNOSIS — I110 Hypertensive heart disease with heart failure: Secondary | ICD-10-CM | POA: Diagnosis not present

## 2023-03-16 DIAGNOSIS — Z741 Need for assistance with personal care: Secondary | ICD-10-CM | POA: Diagnosis not present

## 2023-03-16 DIAGNOSIS — R2689 Other abnormalities of gait and mobility: Secondary | ICD-10-CM | POA: Diagnosis not present

## 2023-03-16 DIAGNOSIS — M6259 Muscle wasting and atrophy, not elsewhere classified, multiple sites: Secondary | ICD-10-CM | POA: Diagnosis not present

## 2023-03-16 DIAGNOSIS — R2681 Unsteadiness on feet: Secondary | ICD-10-CM | POA: Diagnosis not present

## 2023-03-16 DIAGNOSIS — M6281 Muscle weakness (generalized): Secondary | ICD-10-CM | POA: Diagnosis not present

## 2023-03-16 DIAGNOSIS — R293 Abnormal posture: Secondary | ICD-10-CM | POA: Diagnosis not present

## 2023-03-16 DIAGNOSIS — E1169 Type 2 diabetes mellitus with other specified complication: Secondary | ICD-10-CM | POA: Diagnosis not present

## 2023-03-18 DIAGNOSIS — F5101 Primary insomnia: Secondary | ICD-10-CM | POA: Diagnosis not present

## 2023-03-19 DIAGNOSIS — R2681 Unsteadiness on feet: Secondary | ICD-10-CM | POA: Diagnosis not present

## 2023-03-19 DIAGNOSIS — M6281 Muscle weakness (generalized): Secondary | ICD-10-CM | POA: Diagnosis not present

## 2023-03-19 DIAGNOSIS — M6259 Muscle wasting and atrophy, not elsewhere classified, multiple sites: Secondary | ICD-10-CM | POA: Diagnosis not present

## 2023-03-19 DIAGNOSIS — R293 Abnormal posture: Secondary | ICD-10-CM | POA: Diagnosis not present

## 2023-03-19 DIAGNOSIS — Z741 Need for assistance with personal care: Secondary | ICD-10-CM | POA: Diagnosis not present

## 2023-03-19 DIAGNOSIS — I110 Hypertensive heart disease with heart failure: Secondary | ICD-10-CM | POA: Diagnosis not present

## 2023-03-19 DIAGNOSIS — R2689 Other abnormalities of gait and mobility: Secondary | ICD-10-CM | POA: Diagnosis not present

## 2023-03-19 DIAGNOSIS — E1169 Type 2 diabetes mellitus with other specified complication: Secondary | ICD-10-CM | POA: Diagnosis not present

## 2023-03-19 DIAGNOSIS — S72435D Nondisplaced fracture of medial condyle of left femur, subsequent encounter for closed fracture with routine healing: Secondary | ICD-10-CM | POA: Diagnosis not present

## 2023-03-25 ENCOUNTER — Ambulatory Visit: Payer: Medicare Other | Admitting: Podiatry

## 2023-03-25 DIAGNOSIS — Z741 Need for assistance with personal care: Secondary | ICD-10-CM | POA: Diagnosis not present

## 2023-03-25 DIAGNOSIS — R2681 Unsteadiness on feet: Secondary | ICD-10-CM | POA: Diagnosis not present

## 2023-03-25 DIAGNOSIS — S72435D Nondisplaced fracture of medial condyle of left femur, subsequent encounter for closed fracture with routine healing: Secondary | ICD-10-CM | POA: Diagnosis not present

## 2023-03-25 DIAGNOSIS — E1169 Type 2 diabetes mellitus with other specified complication: Secondary | ICD-10-CM | POA: Diagnosis not present

## 2023-03-25 DIAGNOSIS — R2689 Other abnormalities of gait and mobility: Secondary | ICD-10-CM | POA: Diagnosis not present

## 2023-03-25 DIAGNOSIS — R293 Abnormal posture: Secondary | ICD-10-CM | POA: Diagnosis not present

## 2023-03-25 DIAGNOSIS — I110 Hypertensive heart disease with heart failure: Secondary | ICD-10-CM | POA: Diagnosis not present

## 2023-03-25 DIAGNOSIS — M6281 Muscle weakness (generalized): Secondary | ICD-10-CM | POA: Diagnosis not present

## 2023-03-25 DIAGNOSIS — M6259 Muscle wasting and atrophy, not elsewhere classified, multiple sites: Secondary | ICD-10-CM | POA: Diagnosis not present

## 2023-04-08 DIAGNOSIS — R2681 Unsteadiness on feet: Secondary | ICD-10-CM | POA: Diagnosis not present

## 2023-04-08 DIAGNOSIS — I110 Hypertensive heart disease with heart failure: Secondary | ICD-10-CM | POA: Diagnosis not present

## 2023-04-08 DIAGNOSIS — Z741 Need for assistance with personal care: Secondary | ICD-10-CM | POA: Diagnosis not present

## 2023-04-08 DIAGNOSIS — R293 Abnormal posture: Secondary | ICD-10-CM | POA: Diagnosis not present

## 2023-04-08 DIAGNOSIS — M6281 Muscle weakness (generalized): Secondary | ICD-10-CM | POA: Diagnosis not present

## 2023-04-08 DIAGNOSIS — E1169 Type 2 diabetes mellitus with other specified complication: Secondary | ICD-10-CM | POA: Diagnosis not present

## 2023-04-08 DIAGNOSIS — R2689 Other abnormalities of gait and mobility: Secondary | ICD-10-CM | POA: Diagnosis not present

## 2023-04-08 DIAGNOSIS — M6259 Muscle wasting and atrophy, not elsewhere classified, multiple sites: Secondary | ICD-10-CM | POA: Diagnosis not present

## 2023-04-08 DIAGNOSIS — S72435D Nondisplaced fracture of medial condyle of left femur, subsequent encounter for closed fracture with routine healing: Secondary | ICD-10-CM | POA: Diagnosis not present

## 2023-04-13 DIAGNOSIS — S72435D Nondisplaced fracture of medial condyle of left femur, subsequent encounter for closed fracture with routine healing: Secondary | ICD-10-CM | POA: Diagnosis not present

## 2023-04-13 DIAGNOSIS — I5032 Chronic diastolic (congestive) heart failure: Secondary | ICD-10-CM | POA: Diagnosis not present

## 2023-04-13 DIAGNOSIS — E1140 Type 2 diabetes mellitus with diabetic neuropathy, unspecified: Secondary | ICD-10-CM | POA: Diagnosis not present

## 2023-04-13 DIAGNOSIS — R293 Abnormal posture: Secondary | ICD-10-CM | POA: Diagnosis not present

## 2023-04-13 DIAGNOSIS — M6259 Muscle wasting and atrophy, not elsewhere classified, multiple sites: Secondary | ICD-10-CM | POA: Diagnosis not present

## 2023-04-13 DIAGNOSIS — R278 Other lack of coordination: Secondary | ICD-10-CM | POA: Diagnosis not present

## 2023-04-13 DIAGNOSIS — Z741 Need for assistance with personal care: Secondary | ICD-10-CM | POA: Diagnosis not present

## 2023-04-13 DIAGNOSIS — E1169 Type 2 diabetes mellitus with other specified complication: Secondary | ICD-10-CM | POA: Diagnosis not present

## 2023-04-13 DIAGNOSIS — I10 Essential (primary) hypertension: Secondary | ICD-10-CM | POA: Diagnosis not present

## 2023-04-15 DIAGNOSIS — F5101 Primary insomnia: Secondary | ICD-10-CM | POA: Diagnosis not present

## 2023-04-23 DIAGNOSIS — I110 Hypertensive heart disease with heart failure: Secondary | ICD-10-CM | POA: Diagnosis not present

## 2023-04-23 DIAGNOSIS — R293 Abnormal posture: Secondary | ICD-10-CM | POA: Diagnosis not present

## 2023-04-23 DIAGNOSIS — Z741 Need for assistance with personal care: Secondary | ICD-10-CM | POA: Diagnosis not present

## 2023-04-23 DIAGNOSIS — R2681 Unsteadiness on feet: Secondary | ICD-10-CM | POA: Diagnosis not present

## 2023-04-23 DIAGNOSIS — S72435D Nondisplaced fracture of medial condyle of left femur, subsequent encounter for closed fracture with routine healing: Secondary | ICD-10-CM | POA: Diagnosis not present

## 2023-04-23 DIAGNOSIS — R2689 Other abnormalities of gait and mobility: Secondary | ICD-10-CM | POA: Diagnosis not present

## 2023-04-23 DIAGNOSIS — M6281 Muscle weakness (generalized): Secondary | ICD-10-CM | POA: Diagnosis not present

## 2023-04-23 DIAGNOSIS — M6259 Muscle wasting and atrophy, not elsewhere classified, multiple sites: Secondary | ICD-10-CM | POA: Diagnosis not present

## 2023-04-23 DIAGNOSIS — E1169 Type 2 diabetes mellitus with other specified complication: Secondary | ICD-10-CM | POA: Diagnosis not present

## 2023-04-28 DIAGNOSIS — R2681 Unsteadiness on feet: Secondary | ICD-10-CM | POA: Diagnosis not present

## 2023-04-28 DIAGNOSIS — S72435D Nondisplaced fracture of medial condyle of left femur, subsequent encounter for closed fracture with routine healing: Secondary | ICD-10-CM | POA: Diagnosis not present

## 2023-04-28 DIAGNOSIS — Z741 Need for assistance with personal care: Secondary | ICD-10-CM | POA: Diagnosis not present

## 2023-04-28 DIAGNOSIS — M6281 Muscle weakness (generalized): Secondary | ICD-10-CM | POA: Diagnosis not present

## 2023-04-28 DIAGNOSIS — I110 Hypertensive heart disease with heart failure: Secondary | ICD-10-CM | POA: Diagnosis not present

## 2023-04-28 DIAGNOSIS — E1169 Type 2 diabetes mellitus with other specified complication: Secondary | ICD-10-CM | POA: Diagnosis not present

## 2023-04-28 DIAGNOSIS — M6259 Muscle wasting and atrophy, not elsewhere classified, multiple sites: Secondary | ICD-10-CM | POA: Diagnosis not present

## 2023-04-28 DIAGNOSIS — R2689 Other abnormalities of gait and mobility: Secondary | ICD-10-CM | POA: Diagnosis not present

## 2023-04-28 DIAGNOSIS — R293 Abnormal posture: Secondary | ICD-10-CM | POA: Diagnosis not present

## 2023-05-05 DIAGNOSIS — S72435D Nondisplaced fracture of medial condyle of left femur, subsequent encounter for closed fracture with routine healing: Secondary | ICD-10-CM | POA: Diagnosis not present

## 2023-05-05 DIAGNOSIS — E1169 Type 2 diabetes mellitus with other specified complication: Secondary | ICD-10-CM | POA: Diagnosis not present

## 2023-05-05 DIAGNOSIS — M6259 Muscle wasting and atrophy, not elsewhere classified, multiple sites: Secondary | ICD-10-CM | POA: Diagnosis not present

## 2023-05-05 DIAGNOSIS — I110 Hypertensive heart disease with heart failure: Secondary | ICD-10-CM | POA: Diagnosis not present

## 2023-05-05 DIAGNOSIS — Z741 Need for assistance with personal care: Secondary | ICD-10-CM | POA: Diagnosis not present

## 2023-05-05 DIAGNOSIS — R2689 Other abnormalities of gait and mobility: Secondary | ICD-10-CM | POA: Diagnosis not present

## 2023-05-05 DIAGNOSIS — R293 Abnormal posture: Secondary | ICD-10-CM | POA: Diagnosis not present

## 2023-05-05 DIAGNOSIS — R2681 Unsteadiness on feet: Secondary | ICD-10-CM | POA: Diagnosis not present

## 2023-05-05 DIAGNOSIS — M6281 Muscle weakness (generalized): Secondary | ICD-10-CM | POA: Diagnosis not present

## 2023-05-07 DIAGNOSIS — E1169 Type 2 diabetes mellitus with other specified complication: Secondary | ICD-10-CM | POA: Diagnosis not present

## 2023-05-07 DIAGNOSIS — R2689 Other abnormalities of gait and mobility: Secondary | ICD-10-CM | POA: Diagnosis not present

## 2023-05-07 DIAGNOSIS — Z741 Need for assistance with personal care: Secondary | ICD-10-CM | POA: Diagnosis not present

## 2023-05-07 DIAGNOSIS — I110 Hypertensive heart disease with heart failure: Secondary | ICD-10-CM | POA: Diagnosis not present

## 2023-05-07 DIAGNOSIS — M6281 Muscle weakness (generalized): Secondary | ICD-10-CM | POA: Diagnosis not present

## 2023-05-07 DIAGNOSIS — S72435D Nondisplaced fracture of medial condyle of left femur, subsequent encounter for closed fracture with routine healing: Secondary | ICD-10-CM | POA: Diagnosis not present

## 2023-05-07 DIAGNOSIS — R293 Abnormal posture: Secondary | ICD-10-CM | POA: Diagnosis not present

## 2023-05-07 DIAGNOSIS — M6259 Muscle wasting and atrophy, not elsewhere classified, multiple sites: Secondary | ICD-10-CM | POA: Diagnosis not present

## 2023-05-07 DIAGNOSIS — R2681 Unsteadiness on feet: Secondary | ICD-10-CM | POA: Diagnosis not present

## 2023-05-08 DIAGNOSIS — F5101 Primary insomnia: Secondary | ICD-10-CM | POA: Diagnosis not present

## 2023-05-09 DIAGNOSIS — R293 Abnormal posture: Secondary | ICD-10-CM | POA: Diagnosis not present

## 2023-05-09 DIAGNOSIS — M6281 Muscle weakness (generalized): Secondary | ICD-10-CM | POA: Diagnosis not present

## 2023-05-09 DIAGNOSIS — E1169 Type 2 diabetes mellitus with other specified complication: Secondary | ICD-10-CM | POA: Diagnosis not present

## 2023-05-09 DIAGNOSIS — I110 Hypertensive heart disease with heart failure: Secondary | ICD-10-CM | POA: Diagnosis not present

## 2023-05-09 DIAGNOSIS — R2681 Unsteadiness on feet: Secondary | ICD-10-CM | POA: Diagnosis not present

## 2023-05-09 DIAGNOSIS — S72435D Nondisplaced fracture of medial condyle of left femur, subsequent encounter for closed fracture with routine healing: Secondary | ICD-10-CM | POA: Diagnosis not present

## 2023-05-09 DIAGNOSIS — M6259 Muscle wasting and atrophy, not elsewhere classified, multiple sites: Secondary | ICD-10-CM | POA: Diagnosis not present

## 2023-05-09 DIAGNOSIS — Z741 Need for assistance with personal care: Secondary | ICD-10-CM | POA: Diagnosis not present

## 2023-05-09 DIAGNOSIS — R2689 Other abnormalities of gait and mobility: Secondary | ICD-10-CM | POA: Diagnosis not present

## 2023-05-12 DIAGNOSIS — M6259 Muscle wasting and atrophy, not elsewhere classified, multiple sites: Secondary | ICD-10-CM | POA: Diagnosis not present

## 2023-05-12 DIAGNOSIS — E1169 Type 2 diabetes mellitus with other specified complication: Secondary | ICD-10-CM | POA: Diagnosis not present

## 2023-05-12 DIAGNOSIS — Z741 Need for assistance with personal care: Secondary | ICD-10-CM | POA: Diagnosis not present

## 2023-05-12 DIAGNOSIS — R2689 Other abnormalities of gait and mobility: Secondary | ICD-10-CM | POA: Diagnosis not present

## 2023-05-12 DIAGNOSIS — M6281 Muscle weakness (generalized): Secondary | ICD-10-CM | POA: Diagnosis not present

## 2023-05-12 DIAGNOSIS — R293 Abnormal posture: Secondary | ICD-10-CM | POA: Diagnosis not present

## 2023-05-12 DIAGNOSIS — I110 Hypertensive heart disease with heart failure: Secondary | ICD-10-CM | POA: Diagnosis not present

## 2023-05-12 DIAGNOSIS — S72435D Nondisplaced fracture of medial condyle of left femur, subsequent encounter for closed fracture with routine healing: Secondary | ICD-10-CM | POA: Diagnosis not present

## 2023-05-12 DIAGNOSIS — R2681 Unsteadiness on feet: Secondary | ICD-10-CM | POA: Diagnosis not present

## 2023-05-16 DIAGNOSIS — M6281 Muscle weakness (generalized): Secondary | ICD-10-CM | POA: Diagnosis not present

## 2023-05-16 DIAGNOSIS — R2689 Other abnormalities of gait and mobility: Secondary | ICD-10-CM | POA: Diagnosis not present

## 2023-05-16 DIAGNOSIS — E1169 Type 2 diabetes mellitus with other specified complication: Secondary | ICD-10-CM | POA: Diagnosis not present

## 2023-05-16 DIAGNOSIS — M6259 Muscle wasting and atrophy, not elsewhere classified, multiple sites: Secondary | ICD-10-CM | POA: Diagnosis not present

## 2023-05-16 DIAGNOSIS — R293 Abnormal posture: Secondary | ICD-10-CM | POA: Diagnosis not present

## 2023-05-16 DIAGNOSIS — S72435D Nondisplaced fracture of medial condyle of left femur, subsequent encounter for closed fracture with routine healing: Secondary | ICD-10-CM | POA: Diagnosis not present

## 2023-05-16 DIAGNOSIS — I110 Hypertensive heart disease with heart failure: Secondary | ICD-10-CM | POA: Diagnosis not present

## 2023-05-16 DIAGNOSIS — R2681 Unsteadiness on feet: Secondary | ICD-10-CM | POA: Diagnosis not present

## 2023-05-16 DIAGNOSIS — Z741 Need for assistance with personal care: Secondary | ICD-10-CM | POA: Diagnosis not present

## 2023-05-17 DIAGNOSIS — R293 Abnormal posture: Secondary | ICD-10-CM | POA: Diagnosis not present

## 2023-05-17 DIAGNOSIS — Z741 Need for assistance with personal care: Secondary | ICD-10-CM | POA: Diagnosis not present

## 2023-05-17 DIAGNOSIS — S72435D Nondisplaced fracture of medial condyle of left femur, subsequent encounter for closed fracture with routine healing: Secondary | ICD-10-CM | POA: Diagnosis not present

## 2023-05-17 DIAGNOSIS — R278 Other lack of coordination: Secondary | ICD-10-CM | POA: Diagnosis not present

## 2023-05-17 DIAGNOSIS — I10 Essential (primary) hypertension: Secondary | ICD-10-CM | POA: Diagnosis not present

## 2023-05-17 DIAGNOSIS — E1140 Type 2 diabetes mellitus with diabetic neuropathy, unspecified: Secondary | ICD-10-CM | POA: Diagnosis not present

## 2023-05-17 DIAGNOSIS — E1169 Type 2 diabetes mellitus with other specified complication: Secondary | ICD-10-CM | POA: Diagnosis not present

## 2023-05-17 DIAGNOSIS — M6259 Muscle wasting and atrophy, not elsewhere classified, multiple sites: Secondary | ICD-10-CM | POA: Diagnosis not present

## 2023-05-17 DIAGNOSIS — I5032 Chronic diastolic (congestive) heart failure: Secondary | ICD-10-CM | POA: Diagnosis not present

## 2023-05-18 DIAGNOSIS — I110 Hypertensive heart disease with heart failure: Secondary | ICD-10-CM | POA: Diagnosis not present

## 2023-05-18 DIAGNOSIS — S72435D Nondisplaced fracture of medial condyle of left femur, subsequent encounter for closed fracture with routine healing: Secondary | ICD-10-CM | POA: Diagnosis not present

## 2023-05-18 DIAGNOSIS — M6259 Muscle wasting and atrophy, not elsewhere classified, multiple sites: Secondary | ICD-10-CM | POA: Diagnosis not present

## 2023-05-18 DIAGNOSIS — R2689 Other abnormalities of gait and mobility: Secondary | ICD-10-CM | POA: Diagnosis not present

## 2023-05-18 DIAGNOSIS — M6281 Muscle weakness (generalized): Secondary | ICD-10-CM | POA: Diagnosis not present

## 2023-05-18 DIAGNOSIS — R2681 Unsteadiness on feet: Secondary | ICD-10-CM | POA: Diagnosis not present

## 2023-05-18 DIAGNOSIS — E1169 Type 2 diabetes mellitus with other specified complication: Secondary | ICD-10-CM | POA: Diagnosis not present

## 2023-05-18 DIAGNOSIS — R293 Abnormal posture: Secondary | ICD-10-CM | POA: Diagnosis not present

## 2023-05-18 DIAGNOSIS — Z741 Need for assistance with personal care: Secondary | ICD-10-CM | POA: Diagnosis not present

## 2023-05-19 ENCOUNTER — Ambulatory Visit (INDEPENDENT_AMBULATORY_CARE_PROVIDER_SITE_OTHER): Payer: Medicare Other | Admitting: Psychiatry

## 2023-05-19 ENCOUNTER — Encounter: Payer: Self-pay | Admitting: Psychiatry

## 2023-05-19 DIAGNOSIS — Z741 Need for assistance with personal care: Secondary | ICD-10-CM | POA: Diagnosis not present

## 2023-05-19 DIAGNOSIS — F411 Generalized anxiety disorder: Secondary | ICD-10-CM | POA: Diagnosis not present

## 2023-05-19 DIAGNOSIS — R2689 Other abnormalities of gait and mobility: Secondary | ICD-10-CM | POA: Diagnosis not present

## 2023-05-19 DIAGNOSIS — M6281 Muscle weakness (generalized): Secondary | ICD-10-CM | POA: Diagnosis not present

## 2023-05-19 DIAGNOSIS — G301 Alzheimer's disease with late onset: Secondary | ICD-10-CM | POA: Diagnosis not present

## 2023-05-19 DIAGNOSIS — F200 Paranoid schizophrenia: Secondary | ICD-10-CM | POA: Diagnosis not present

## 2023-05-19 DIAGNOSIS — R293 Abnormal posture: Secondary | ICD-10-CM | POA: Diagnosis not present

## 2023-05-19 DIAGNOSIS — E1169 Type 2 diabetes mellitus with other specified complication: Secondary | ICD-10-CM | POA: Diagnosis not present

## 2023-05-19 DIAGNOSIS — S72435D Nondisplaced fracture of medial condyle of left femur, subsequent encounter for closed fracture with routine healing: Secondary | ICD-10-CM | POA: Diagnosis not present

## 2023-05-19 DIAGNOSIS — F422 Mixed obsessional thoughts and acts: Secondary | ICD-10-CM | POA: Diagnosis not present

## 2023-05-19 DIAGNOSIS — F02818 Dementia in other diseases classified elsewhere, unspecified severity, with other behavioral disturbance: Secondary | ICD-10-CM

## 2023-05-19 DIAGNOSIS — R2681 Unsteadiness on feet: Secondary | ICD-10-CM | POA: Diagnosis not present

## 2023-05-19 DIAGNOSIS — M6259 Muscle wasting and atrophy, not elsewhere classified, multiple sites: Secondary | ICD-10-CM | POA: Diagnosis not present

## 2023-05-19 DIAGNOSIS — I110 Hypertensive heart disease with heart failure: Secondary | ICD-10-CM | POA: Diagnosis not present

## 2023-05-19 NOTE — Progress Notes (Signed)
 Ricky [name] [id number] 12/20/1940 83 y.o.     Subjective:   Patient ID:  Ricky Loupe. is a 83 y.o. (DOB 30-Dec-1940) male.  Chief Complaint:  Chief Complaint  Patient presents with   Follow-up   Anxiety   Paranoid    Ricky [name]. presents to the office today for follow-up of schizophrenia and dementia.  Resident of [facility].  seen April 15, 2019.  No meds were changed.  As of 06/03/2019 the following is noted: Staff reports patient calls very frequently and has for an extended period of time.  He is chronically somewhat needy and lonely as well as has a tendency for reassurance checking. Still bothered by religious thoughts and can't tell if it's from God than or Satan.  Supernatural things get in his mind.  Also thinks that people there don't like him at [facility].  Also upset at Biden's spending excess.  Wants to vote for [employer].  Also bothered he doesn't get to do much.    Had thoughts he needed to go to the hospital one night for MH reasons but staff encouraged him to wait it out and he felt better the next day. Poor STM. No med changes  9/20/21appt with the following noted: Worries over losing trusted banker.  Worries money is running out.  Worries about getting Covid.  Memory is worse.  Gets obsessive fears about his salvation especially  Listening to [place] radio at times but has chronically worried over it.   Hears voices talking about his mental health and to listen to radio.   Don't have a whole lot of them but enough to get him upset from time to time.. Not markedly depressed. Plan no med changes  02/21/2020 appointment with the following noted: [place] some noise, ring in his head but not voices. Worries about it. They take good care of me over there.  Listens to radio a lot at night. And doesn't get a lot of sleep at night but will nap daytime. Reads bible but often doesn't understand or remember it. No SE. Calls on [facility] to help him  emotionally and spiritually.   LTM good.  More poor STM.    Wonders if it's possible that he'll ever get well.    Watched NFL football yesterday and enjoyed it. Plan: No med changes  07/19/2020 appointment with the following noted: Has had to call couple times after hours bc was ruminating on past issues.  Today feels OK. Recognizes problems with STM but in his heart feels it will get better. Food good at [employer].  People are treating him well except with one man.  Twilla Galea avoids him. Still on olanzapine  25 mg HS and fluovxamine 100, zolpidem  5 HS, Xanax  0.25 mg prn. Can't afford caregiver to take him out daily as in the past. Wonders what doc thinks his IQ would have been.   No concerns about med except wants one to make him well.  01/18/2021 appt noted: Still at [facility].  Feels OK about it.   Still ups and downs but overall holding his own.  Doesn't feel 83 yo.  WC bound but can still move himself around independently.  U incontinence. Getting along pretty well with people, but times thinks people don't like him.  Still wonders if he needs psych hospitalization. Notices memory problems. Still worries chronically over his salvation but not continuous and anxiety is no worse. Voices are better but still has intermittent AH.  CO trouble staying asleep but at times can sleep too much No SE noted Doing PT to try and get stronger. Plan: No med changes today. Successfully reduced alprazolam  to 0.25 mg HS Continue olanzapine  25 mg HS for psychosis Continue fluvoxamine  100 mg HS for OCD and anxiety Continue zolpidem  5 mg HS for sleep Consider stopping alprazolam  0.25 mg HS as long as he's sleeping well. Or reducing to 0.125 mg HS.  Dose is already low  07/19/2021 appointment with the following noted: Incontinent of urine in the office. He thinks overall he is doing better.  Not afraid at all.  Some worry.   Realizes he's here for 6 mos followup. But he does recognize some memory  problems.  Sometimes has trouble with use of technology. Not much re: voices except hears noises at times that disturb him.   Not depressed.  Enjoys bingo. Asks for reassurance. No med concerns. Plan: No med changes today. Successfully reduced alprazolam  to 0.25 mg HS Continue olanzapine  25 mg HS for psychosis Continue fluvoxamine  100 mg HS for OCD and anxiety Continue zolpidem  5 mg HS for sleep Consider stopping alprazolam  0.25 mg HS as long as he's sleeping well. Or reducing to 0.125 mg HS.  Dose is already low  01/17/22 appt noted: Reduced alpraZolam  to 0.125 mg nightly and Ambien  to 2.5 mg nightly.  He has continued the other medicines.   He's satisfied living at [facility]. "Do you think I've been improved?"  Not depressed.  Worry comes and goes. Listens to relaxing radio show at night. He's aware he is not making frequent calls to the office like he used to do. Acknowledges some memory issues. Not as bothered by [facility]   Some worry that he's run out of his trust fund money.   07/15/22 appt noted: More problems since here.  Had u incontinence in the [facility] today.  This is a common problem.  Not wearing depends.   "It's been a long 6 mos".  Stressed. He is bored often.   Some trouble dealing with people but tries to make the best of it. Psych meds: off alprazolam .  Fluvoxamine  100 HS, olanzapine  25 mg HS. Zolpidem  2.5 mg HS No SE or med concerns. Some chronic difficulty dealing with people bc often thinks they might not like him.  Gets suspicious. Plan: no changes  01/20/23 appt noted: Psych meds: off alprazolam .  Fluvoxamine  100 HS, olanzapine  25 mg HS. Zolpidem  2.5 mg HS.  Reviewed and confirmed with paperwork from [facility].  Trying to manage DM and complications.   Has a new roommate and doing ok so far.  Roommate asked him to turn off light at night and seems more relaxed with the light off.   Pt aware of age and date.  He feels like he's only about 21-84 years old.  Not  dep.  Still struggles with anxiety over dealing with people and the Bible.  He will worry that he is not responding in the right way to the Bible and worries over his faith.  05/19/23 appt noted: Med as above Has room mate and getting along well now.  Doesn't remember if had room mate before. 83 yo and reads Bible daily and prays.  Will feel afraid if doesn't.  His money ran out of his trust.  But still able to stay at [facility].  Has cousin who is helping some.  He doesn't want to move.   Not dep.  Anxiety about the same.   No SE  or med concerns.   Sleep never has been very good.  Will tend to stay awake.    Past Psychiatric Medication Trials:  some of them are unknown,  haloperidol, Serentil, Stelazine, Moban, perphenazine, risperidone, Thorazine, Seroquel 800 mg, loxapine, been on Zyprexa  since September 2006 and that has produced the best control of his paranoia at 25 mg daily. benztropine,    paroxetine, sertraline side effects,   Hydroxyzine , trazodone no response,  off Xanax , zolpidem .  Review of Systems:  Review of Systems  Constitutional:  Positive for fatigue.  HENT:  Positive for tinnitus.   Cardiovascular:  Negative for chest pain and palpitations.  Genitourinary:  Positive for enuresis. Negative for flank pain.       Urinary incontinence  Musculoskeletal:  Positive for arthralgias, back pain and gait problem.  Neurological:  Positive for dizziness, tremors and weakness. Negative for headaches.       WC bound  Psychiatric/Behavioral:  Positive for confusion, decreased concentration, hallucinations and sleep disturbance. Negative for agitation, self-injury and suicidal ideas. The patient is nervous/anxious. The patient is not hyperactive.     Medications: I have reviewed the patient's current medications.  Current Outpatient Medications  Medication Sig Dispense Refill   amLODipine  (NORVASC ) 2.5 MG tablet Take 2.5 mg by mouth daily.     Calcium Citrate-Vitamin D   (CITRACAL + D PO) Take 1 tablet by mouth daily.     cholecalciferol  (VITAMIN D3) 25 MCG (1000 UNIT) tablet Take 1,000 Units by mouth daily.     docusate sodium  (COLACE) 100 MG capsule Take 100 mg by mouth 2 (two) times daily.     empagliflozin  (JARDIANCE ) 25 MG TABS tablet Take 25 mg by mouth daily.     fluvoxaMINE  (LUVOX ) 100 MG tablet Take 1 tablet (100 mg total) by mouth at bedtime. 2 tablet 0   furosemide  (LASIX ) 20 MG tablet Take 10 mg by mouth daily before breakfast.     guaiFENesin  (ROBITUSSIN) 100 MG/5ML liquid Take 5 mLs by mouth every 4 (four) hours as needed for cough or to loosen phlegm.     HUMALOG KWIKPEN 100 UNIT/ML KiwkPen Inject 2-11 Units into the skin See admin instructions. Times: 0630 1130 1630  Scale: 101-150: 2 units 151-200: 3 units 201-250: 5 units 251-300: 7 units 301-350: 9 units >350: 11 units     insulin  glargine (LANTUS ) 100 UNIT/ML Solostar Pen Inject 15 Units into the skin daily.     ipratropium-albuterol  (DUONEB) 0.5-2.5 (3) MG/3ML SOLN Take 3 mLs by nebulization every 8 (eight) hours as needed.     mirabegron  ER (MYRBETRIQ ) 50 MG TB24 tablet Take 50 mg by mouth daily.     Multiple Vitamins-Minerals (THERAGRAN-M PREMIER 50 PLUS PO) Take 1 tablet by mouth daily.     OLANZapine  (ZYPREXA ) 5 MG tablet Take 5 tablets (25 mg total) by mouth daily. 2 tablet 0   omeprazole (PRILOSEC) 40 MG capsule Take 40 mg by mouth daily.      pravastatin  (PRAVACHOL ) 40 MG tablet Take 40 mg by mouth at bedtime.     senna-docusate (SENOKOT-S) 8.6-50 MG tablet Take 1 tablet by mouth at bedtime as needed for mild constipation.     tamsulosin  (FLOMAX ) 0.4 MG CAPS capsule Take 0.4 mg by mouth every evening.     zolpidem  (AMBIEN ) 5 MG tablet Take 1 tablet (5 mg total) by mouth at bedtime. (Patient taking differently: Take 2.5 mg by mouth at bedtime.) 2 tablet 0   No current facility-administered medications for this visit.  Medication Side Effects: Other: dry  mouth  Allergies:  Allergies  Allergen Reactions   Asa [Aspirin]     "Dr told me not to take it"   Codeine Other (See Comments)    DIZZINESS with Tylenol  3   Tylenol  With Codeine #3 [Acetaminophen -Codeine]     Other reaction(s): Unknown   Tylenol  [Acetaminophen ] Other (See Comments)    Dizziness with tylenol  3    Past Medical History:  Diagnosis Date   Abnormality of gait    Adenomatous colon polyp    Arthritis    Cataract    Dementia (HCC)    Depression    Diabetes mellitus without complication (HCC)    Fatty liver    Hyperlipidemia    Hypertension    Internal hemorrhoids    Other malaise and fatigue    Peripheral edema    Schizophrenia (HCC)    Type II or unspecified type diabetes mellitus without mention of complication, not stated as uncontrolled    Urinary frequency    Urinary retention     Family History  Problem Relation Age of Onset   Brain cancer Father     Social History   Socioeconomic History   Marital status: Single    Spouse name: Not on file   Number of children: Not on file   Years of education: college   Highest education level: Not on file  Occupational History    Employer: RETIRED    Comment: Disabled  Tobacco Use   Smoking status: Never   Smokeless tobacco: Never  Substance and Sexual Activity   Alcohol use: No   Drug use: No   Sexual activity: Not on file  Other Topics Concern   Not on file  Social History Narrative   Patient is disabled and he has not worked since 1962. Patient has some college education.Patient drinks 2 cups of coffee daily.   Right handed.   Social Drivers of Corporate investment banker Strain: Not on file  Food Insecurity: Patient Unable To Answer (09/24/2022)   Hunger Vital Sign    Worried About Running Out of Food in the Last Year: Patient unable to answer    Ran Out of Food in the Last Year: Patient unable to answer  Transportation Needs: No Transportation Needs (09/24/2022)   PRAPARE - Therapist, art (Medical): No    Lack of Transportation (Non-Medical): No  Physical Activity: Not on file  Stress: Not on file  Social Connections: Not on file  Intimate Partner Violence: Not At Risk (09/24/2022)   Humiliation, Afraid, Rape, and Kick questionnaire    Fear of Current or Ex-Partner: No    Emotionally Abused: No    Physically Abused: No    Sexually Abused: No    Past Medical History, Surgical history, Social history, and Family history were reviewed and updated as appropriate.   Please see review of systems for further details on the patient's review from today.   Objective:   Physical Exam:  There were no vitals taken for this visit.  Physical Exam Constitutional:      Appearance: He is obese.  Neurological:     Mental Status: He is alert and oriented to person, place, and time.     Cranial Nerves: Dysarthria present.     Motor: Weakness present.     Gait: Gait abnormal.     Comments: Mild dysarthria chronic   Psychiatric:        Attention and  Perception: Attention normal. He is attentive. He perceives auditory hallucinations. He does not perceive visual hallucinations.        Mood and Affect: Mood is anxious. Mood is not depressed. Affect is not angry or tearful.        Speech: Speech normal. Speech is not rapid and pressured or slurred.        Behavior: Behavior is slowed. Behavior is not agitated or aggressive. Behavior is cooperative.        Thought Content: Thought content is paranoid and delusional. Thought content does not include homicidal or suicidal ideation. Thought content does not include homicidal or suicidal plan.        Cognition and Memory: Cognition is impaired. Memory is impaired. He does not exhibit impaired recent memory.     Comments: Chronic voices better. AH about the same and not to bothersome.  Fair insight and judgment. Talkative and repeats himself some. Memory is stable.Ricky [name] words at times but decent fund of  knowledge. Reassurance seeking chronically unchanged.   Some IOR re commericials on TV ongoing not disturbing. Obsessive spiritual thought and compulsive behaviors are less prominent  residual paranoid Affect calm.      Lab Review:     Component Value Date/Time   NA 136 09/26/2022 0128   K 3.8 09/26/2022 0128   CL 100 09/26/2022 0128   CO2 25 09/26/2022 0128   GLUCOSE 186 (H) 09/26/2022 0128   BUN 17 09/26/2022 0128   CREATININE 0.97 09/26/2022 0128   CALCIUM 8.6 (L) 09/26/2022 0128   PROT 7.3 09/23/2022 1730   ALBUMIN 2.9 (L) 09/23/2022 1730   AST 18 09/23/2022 1730   ALT 19 09/23/2022 1730   ALKPHOS 93 09/23/2022 1730   BILITOT 0.6 09/23/2022 1730   GFRNONAA >60 09/26/2022 0128   GFRAA >60 01/22/2018 0339       Component Value Date/Time   WBC 13.6 (H) 09/26/2022 0128   RBC 3.61 (L) 09/26/2022 0128   HGB 10.9 (L) 09/26/2022 0128   HCT 35.3 (L) 09/26/2022 0128   PLT 215 09/26/2022 0128   MCV 97.8 09/26/2022 0128   MCH 30.2 09/26/2022 0128   MCHC 30.9 09/26/2022 0128   RDW 14.6 09/26/2022 0128   LYMPHSABS 0.7 09/23/2022 1730   MONOABS 0.8 09/23/2022 1730   EOSABS 0.0 09/23/2022 1730   BASOSABS 0.0 09/23/2022 1730    No results found for: "POCLITH", "LITHIUM"   No results found for: "PHENYTOIN", "PHENOBARB", "VALPROATE", "CBMZ"   .res Assessment: Plan:    Ricky [name] "Twilla Galea" was seen today for follow-up, anxiety and paranoid.  Diagnoses and all orders for this visit:  Schizophrenia, paranoid (HCC)  Mixed obsessional thoughts and acts  Generalized anxiety disorder  Late onset Alzheimer's disease with behavioral disturbance (HCC)   30 min face to face time with patient was spent on counseling and coordination of care. We discussed several topics as noted:  Patient has been under my psychiatric years since 1998 .unlikely that med change will reduce the paranoia and auditory hallucinations which are chronic. Voices are better and paranoia appears a little  better but varies.  Reports still cooperative with staff.  He has been on multiple psychiatric medications and has done best on this combination of Zyprexa  which was increased to 25 mg April 09, 2018, , , and fluvoxamine  100 mg daily. Is cooperative genereally.   He also still has obsessive and intrusive fears about losing his salvation but they are chronic but less severe over time.  Given  his age and the chronicity of the symptoms med changes are unlikely to help.  Chronic reassurance seeking.  Calls people for reassurance. Overall also paranoia is better but not severe.  Needs high dose olanzapine .    Supportive therapy dealing with chronic paranoia.  Encourage his spirituality as a coping mechanism.  Needs a lot of encouragement.  Repetitively asks for reassurance.  He is making fewer after hours phone calls to our office than historically has been done.  Does not look like any in the last few months.    Weaker and needing more physical assistance.  WC bound  .  Looks more debilitated.    Discussed potential metabolic side effects associated with atypical antipsychotics, as well as potential risk for movement side effects. Advised pt to contact office if movement side effects occur.   Disc he's taking more than the usuall highest dose of olanzapine  but is medically necessary for paranoia. No AIM.  No med changes indicated: Continue olanzapine  25 mg HS for psychosis Continue fluvoxamine  100 mg HS for OCD and anxiety Continue zolpidem  2.5 mg HS for sleep.  If he gets the olanzapine  2-3 hours before  bedtime he might be able to stop this too.  30 min appt  FU 4 mos  Nori Beat, MD, DFAPA .    No future appointments.   No orders of the defined types were placed in this encounter.      -------------------------------

## 2023-05-23 ENCOUNTER — Encounter: Payer: Self-pay | Admitting: Podiatry

## 2023-05-23 ENCOUNTER — Ambulatory Visit (INDEPENDENT_AMBULATORY_CARE_PROVIDER_SITE_OTHER): Admitting: Podiatry

## 2023-05-23 DIAGNOSIS — M79609 Pain in unspecified limb: Secondary | ICD-10-CM | POA: Diagnosis not present

## 2023-05-23 DIAGNOSIS — B351 Tinea unguium: Secondary | ICD-10-CM | POA: Diagnosis not present

## 2023-05-23 DIAGNOSIS — E1159 Type 2 diabetes mellitus with other circulatory complications: Secondary | ICD-10-CM | POA: Diagnosis not present

## 2023-05-23 NOTE — Progress Notes (Signed)
This patient returns to my office for at risk foot care.  This patient requires this care by a professional since this patient will be at risk due to having CKD and diabetes.  This patient is unable to cut nails himself since the patient cannot reach his nails.These nails are painful walking and wearing shoes. This patient presents to the office in a wheelchair.  This patient presents for at risk foot care today.  General Appearance  Alert, conversant and in no acute stress.  Vascular  Dorsalis pedis and posterior tibial  pulses are weakly  palpable  bilaterally.  Capillary return is within normal limits  bilaterally. Cold feet  Bilaterally. Absent digital hair B/L.  Neurologic  Senn-Weinstein monofilament wire test diminished   bilaterally. Muscle power within normal limits bilaterally.  Nails Thick disfigured discolored nails with subungual debris  from hallux to fifth toes bilaterally. No evidence of bacterial infection or drainage bilaterally.  Orthopedic  No limitations of motion  feet .  No crepitus or effusions noted.  Contracted digits  B/L.  Skin  normotropic skin with no porokeratosis noted bilaterally.  No signs of infections or ulcers noted.     Onychomycosis  Pain in right toes  Pain in left toes  Consent was obtained for treatment procedures.   Mechanical debridement of nails 1-5  bilaterally performed with a nail nipper.    RTC 3 months.     Return office visit   3 months                  Told patient to return for periodic foot care and evaluation due to potential at risk complications.   Gregory Mayer DPM  

## 2023-05-26 DIAGNOSIS — S72435D Nondisplaced fracture of medial condyle of left femur, subsequent encounter for closed fracture with routine healing: Secondary | ICD-10-CM | POA: Diagnosis not present

## 2023-05-26 DIAGNOSIS — R262 Difficulty in walking, not elsewhere classified: Secondary | ICD-10-CM | POA: Diagnosis not present

## 2023-05-27 DIAGNOSIS — S72435D Nondisplaced fracture of medial condyle of left femur, subsequent encounter for closed fracture with routine healing: Secondary | ICD-10-CM | POA: Diagnosis not present

## 2023-05-27 DIAGNOSIS — R262 Difficulty in walking, not elsewhere classified: Secondary | ICD-10-CM | POA: Diagnosis not present

## 2023-05-28 DIAGNOSIS — S72435D Nondisplaced fracture of medial condyle of left femur, subsequent encounter for closed fracture with routine healing: Secondary | ICD-10-CM | POA: Diagnosis not present

## 2023-05-28 DIAGNOSIS — R262 Difficulty in walking, not elsewhere classified: Secondary | ICD-10-CM | POA: Diagnosis not present

## 2023-05-29 DIAGNOSIS — R262 Difficulty in walking, not elsewhere classified: Secondary | ICD-10-CM | POA: Diagnosis not present

## 2023-05-29 DIAGNOSIS — S72435D Nondisplaced fracture of medial condyle of left femur, subsequent encounter for closed fracture with routine healing: Secondary | ICD-10-CM | POA: Diagnosis not present

## 2023-05-30 DIAGNOSIS — R262 Difficulty in walking, not elsewhere classified: Secondary | ICD-10-CM | POA: Diagnosis not present

## 2023-05-30 DIAGNOSIS — R2689 Other abnormalities of gait and mobility: Secondary | ICD-10-CM | POA: Diagnosis not present

## 2023-05-30 DIAGNOSIS — M6259 Muscle wasting and atrophy, not elsewhere classified, multiple sites: Secondary | ICD-10-CM | POA: Diagnosis not present

## 2023-05-30 DIAGNOSIS — S72435D Nondisplaced fracture of medial condyle of left femur, subsequent encounter for closed fracture with routine healing: Secondary | ICD-10-CM | POA: Diagnosis not present

## 2023-05-30 DIAGNOSIS — E1169 Type 2 diabetes mellitus with other specified complication: Secondary | ICD-10-CM | POA: Diagnosis not present

## 2023-05-30 DIAGNOSIS — R293 Abnormal posture: Secondary | ICD-10-CM | POA: Diagnosis not present

## 2023-05-30 DIAGNOSIS — R059 Cough, unspecified: Secondary | ICD-10-CM | POA: Diagnosis not present

## 2023-05-30 DIAGNOSIS — R2681 Unsteadiness on feet: Secondary | ICD-10-CM | POA: Diagnosis not present

## 2023-05-30 DIAGNOSIS — Z741 Need for assistance with personal care: Secondary | ICD-10-CM | POA: Diagnosis not present

## 2023-05-30 DIAGNOSIS — I110 Hypertensive heart disease with heart failure: Secondary | ICD-10-CM | POA: Diagnosis not present

## 2023-05-30 DIAGNOSIS — M6281 Muscle weakness (generalized): Secondary | ICD-10-CM | POA: Diagnosis not present

## 2023-05-31 DIAGNOSIS — R2689 Other abnormalities of gait and mobility: Secondary | ICD-10-CM | POA: Diagnosis not present

## 2023-05-31 DIAGNOSIS — M6281 Muscle weakness (generalized): Secondary | ICD-10-CM | POA: Diagnosis not present

## 2023-05-31 DIAGNOSIS — R2681 Unsteadiness on feet: Secondary | ICD-10-CM | POA: Diagnosis not present

## 2023-05-31 DIAGNOSIS — I110 Hypertensive heart disease with heart failure: Secondary | ICD-10-CM | POA: Diagnosis not present

## 2023-05-31 DIAGNOSIS — M6259 Muscle wasting and atrophy, not elsewhere classified, multiple sites: Secondary | ICD-10-CM | POA: Diagnosis not present

## 2023-05-31 DIAGNOSIS — Z741 Need for assistance with personal care: Secondary | ICD-10-CM | POA: Diagnosis not present

## 2023-05-31 DIAGNOSIS — R293 Abnormal posture: Secondary | ICD-10-CM | POA: Diagnosis not present

## 2023-05-31 DIAGNOSIS — E1169 Type 2 diabetes mellitus with other specified complication: Secondary | ICD-10-CM | POA: Diagnosis not present

## 2023-05-31 DIAGNOSIS — S72435D Nondisplaced fracture of medial condyle of left femur, subsequent encounter for closed fracture with routine healing: Secondary | ICD-10-CM | POA: Diagnosis not present

## 2023-06-02 DIAGNOSIS — R262 Difficulty in walking, not elsewhere classified: Secondary | ICD-10-CM | POA: Diagnosis not present

## 2023-06-02 DIAGNOSIS — S72435D Nondisplaced fracture of medial condyle of left femur, subsequent encounter for closed fracture with routine healing: Secondary | ICD-10-CM | POA: Diagnosis not present

## 2023-06-03 DIAGNOSIS — S72435D Nondisplaced fracture of medial condyle of left femur, subsequent encounter for closed fracture with routine healing: Secondary | ICD-10-CM | POA: Diagnosis not present

## 2023-06-03 DIAGNOSIS — Z741 Need for assistance with personal care: Secondary | ICD-10-CM | POA: Diagnosis not present

## 2023-06-03 DIAGNOSIS — M6281 Muscle weakness (generalized): Secondary | ICD-10-CM | POA: Diagnosis not present

## 2023-06-03 DIAGNOSIS — R293 Abnormal posture: Secondary | ICD-10-CM | POA: Diagnosis not present

## 2023-06-03 DIAGNOSIS — I110 Hypertensive heart disease with heart failure: Secondary | ICD-10-CM | POA: Diagnosis not present

## 2023-06-03 DIAGNOSIS — R262 Difficulty in walking, not elsewhere classified: Secondary | ICD-10-CM | POA: Diagnosis not present

## 2023-06-03 DIAGNOSIS — M6259 Muscle wasting and atrophy, not elsewhere classified, multiple sites: Secondary | ICD-10-CM | POA: Diagnosis not present

## 2023-06-03 DIAGNOSIS — R2681 Unsteadiness on feet: Secondary | ICD-10-CM | POA: Diagnosis not present

## 2023-06-03 DIAGNOSIS — R2689 Other abnormalities of gait and mobility: Secondary | ICD-10-CM | POA: Diagnosis not present

## 2023-06-03 DIAGNOSIS — E1169 Type 2 diabetes mellitus with other specified complication: Secondary | ICD-10-CM | POA: Diagnosis not present

## 2023-06-04 DIAGNOSIS — Z794 Long term (current) use of insulin: Secondary | ICD-10-CM | POA: Diagnosis not present

## 2023-06-04 DIAGNOSIS — E1151 Type 2 diabetes mellitus with diabetic peripheral angiopathy without gangrene: Secondary | ICD-10-CM | POA: Diagnosis not present

## 2023-06-04 DIAGNOSIS — S72435D Nondisplaced fracture of medial condyle of left femur, subsequent encounter for closed fracture with routine healing: Secondary | ICD-10-CM | POA: Diagnosis not present

## 2023-06-04 DIAGNOSIS — L84 Corns and callosities: Secondary | ICD-10-CM | POA: Diagnosis not present

## 2023-06-04 DIAGNOSIS — R262 Difficulty in walking, not elsewhere classified: Secondary | ICD-10-CM | POA: Diagnosis not present

## 2023-06-04 DIAGNOSIS — L602 Onychogryphosis: Secondary | ICD-10-CM | POA: Diagnosis not present

## 2023-06-04 DIAGNOSIS — L603 Nail dystrophy: Secondary | ICD-10-CM | POA: Diagnosis not present

## 2023-06-05 DIAGNOSIS — S72435D Nondisplaced fracture of medial condyle of left femur, subsequent encounter for closed fracture with routine healing: Secondary | ICD-10-CM | POA: Diagnosis not present

## 2023-06-05 DIAGNOSIS — R262 Difficulty in walking, not elsewhere classified: Secondary | ICD-10-CM | POA: Diagnosis not present

## 2023-06-06 DIAGNOSIS — S72435D Nondisplaced fracture of medial condyle of left femur, subsequent encounter for closed fracture with routine healing: Secondary | ICD-10-CM | POA: Diagnosis not present

## 2023-06-06 DIAGNOSIS — R262 Difficulty in walking, not elsewhere classified: Secondary | ICD-10-CM | POA: Diagnosis not present

## 2023-06-08 DIAGNOSIS — R293 Abnormal posture: Secondary | ICD-10-CM | POA: Diagnosis not present

## 2023-06-08 DIAGNOSIS — I110 Hypertensive heart disease with heart failure: Secondary | ICD-10-CM | POA: Diagnosis not present

## 2023-06-08 DIAGNOSIS — M6281 Muscle weakness (generalized): Secondary | ICD-10-CM | POA: Diagnosis not present

## 2023-06-08 DIAGNOSIS — Z741 Need for assistance with personal care: Secondary | ICD-10-CM | POA: Diagnosis not present

## 2023-06-08 DIAGNOSIS — M6259 Muscle wasting and atrophy, not elsewhere classified, multiple sites: Secondary | ICD-10-CM | POA: Diagnosis not present

## 2023-06-08 DIAGNOSIS — S72435D Nondisplaced fracture of medial condyle of left femur, subsequent encounter for closed fracture with routine healing: Secondary | ICD-10-CM | POA: Diagnosis not present

## 2023-06-08 DIAGNOSIS — R2681 Unsteadiness on feet: Secondary | ICD-10-CM | POA: Diagnosis not present

## 2023-06-08 DIAGNOSIS — E1169 Type 2 diabetes mellitus with other specified complication: Secondary | ICD-10-CM | POA: Diagnosis not present

## 2023-06-08 DIAGNOSIS — R2689 Other abnormalities of gait and mobility: Secondary | ICD-10-CM | POA: Diagnosis not present

## 2023-06-09 DIAGNOSIS — Z741 Need for assistance with personal care: Secondary | ICD-10-CM | POA: Diagnosis not present

## 2023-06-09 DIAGNOSIS — E1169 Type 2 diabetes mellitus with other specified complication: Secondary | ICD-10-CM | POA: Diagnosis not present

## 2023-06-09 DIAGNOSIS — R2681 Unsteadiness on feet: Secondary | ICD-10-CM | POA: Diagnosis not present

## 2023-06-09 DIAGNOSIS — M6281 Muscle weakness (generalized): Secondary | ICD-10-CM | POA: Diagnosis not present

## 2023-06-09 DIAGNOSIS — R2689 Other abnormalities of gait and mobility: Secondary | ICD-10-CM | POA: Diagnosis not present

## 2023-06-09 DIAGNOSIS — S72435D Nondisplaced fracture of medial condyle of left femur, subsequent encounter for closed fracture with routine healing: Secondary | ICD-10-CM | POA: Diagnosis not present

## 2023-06-09 DIAGNOSIS — R262 Difficulty in walking, not elsewhere classified: Secondary | ICD-10-CM | POA: Diagnosis not present

## 2023-06-09 DIAGNOSIS — M6259 Muscle wasting and atrophy, not elsewhere classified, multiple sites: Secondary | ICD-10-CM | POA: Diagnosis not present

## 2023-06-09 DIAGNOSIS — I110 Hypertensive heart disease with heart failure: Secondary | ICD-10-CM | POA: Diagnosis not present

## 2023-06-09 DIAGNOSIS — R293 Abnormal posture: Secondary | ICD-10-CM | POA: Diagnosis not present

## 2023-06-10 DIAGNOSIS — E1169 Type 2 diabetes mellitus with other specified complication: Secondary | ICD-10-CM | POA: Diagnosis not present

## 2023-06-10 DIAGNOSIS — E1140 Type 2 diabetes mellitus with diabetic neuropathy, unspecified: Secondary | ICD-10-CM | POA: Diagnosis not present

## 2023-06-10 DIAGNOSIS — R262 Difficulty in walking, not elsewhere classified: Secondary | ICD-10-CM | POA: Diagnosis not present

## 2023-06-10 DIAGNOSIS — R278 Other lack of coordination: Secondary | ICD-10-CM | POA: Diagnosis not present

## 2023-06-10 DIAGNOSIS — I5032 Chronic diastolic (congestive) heart failure: Secondary | ICD-10-CM | POA: Diagnosis not present

## 2023-06-10 DIAGNOSIS — R293 Abnormal posture: Secondary | ICD-10-CM | POA: Diagnosis not present

## 2023-06-10 DIAGNOSIS — I10 Essential (primary) hypertension: Secondary | ICD-10-CM | POA: Diagnosis not present

## 2023-06-10 DIAGNOSIS — Z741 Need for assistance with personal care: Secondary | ICD-10-CM | POA: Diagnosis not present

## 2023-06-10 DIAGNOSIS — M6259 Muscle wasting and atrophy, not elsewhere classified, multiple sites: Secondary | ICD-10-CM | POA: Diagnosis not present

## 2023-06-10 DIAGNOSIS — S72435D Nondisplaced fracture of medial condyle of left femur, subsequent encounter for closed fracture with routine healing: Secondary | ICD-10-CM | POA: Diagnosis not present

## 2023-06-11 DIAGNOSIS — S72435D Nondisplaced fracture of medial condyle of left femur, subsequent encounter for closed fracture with routine healing: Secondary | ICD-10-CM | POA: Diagnosis not present

## 2023-06-11 DIAGNOSIS — R262 Difficulty in walking, not elsewhere classified: Secondary | ICD-10-CM | POA: Diagnosis not present

## 2023-06-12 DIAGNOSIS — S72435D Nondisplaced fracture of medial condyle of left femur, subsequent encounter for closed fracture with routine healing: Secondary | ICD-10-CM | POA: Diagnosis not present

## 2023-06-12 DIAGNOSIS — R262 Difficulty in walking, not elsewhere classified: Secondary | ICD-10-CM | POA: Diagnosis not present

## 2023-06-13 DIAGNOSIS — S72435D Nondisplaced fracture of medial condyle of left femur, subsequent encounter for closed fracture with routine healing: Secondary | ICD-10-CM | POA: Diagnosis not present

## 2023-06-13 DIAGNOSIS — R262 Difficulty in walking, not elsewhere classified: Secondary | ICD-10-CM | POA: Diagnosis not present

## 2023-06-16 DIAGNOSIS — S72435D Nondisplaced fracture of medial condyle of left femur, subsequent encounter for closed fracture with routine healing: Secondary | ICD-10-CM | POA: Diagnosis not present

## 2023-06-16 DIAGNOSIS — R262 Difficulty in walking, not elsewhere classified: Secondary | ICD-10-CM | POA: Diagnosis not present

## 2023-06-17 DIAGNOSIS — R262 Difficulty in walking, not elsewhere classified: Secondary | ICD-10-CM | POA: Diagnosis not present

## 2023-06-17 DIAGNOSIS — F5101 Primary insomnia: Secondary | ICD-10-CM | POA: Diagnosis not present

## 2023-06-17 DIAGNOSIS — S72435D Nondisplaced fracture of medial condyle of left femur, subsequent encounter for closed fracture with routine healing: Secondary | ICD-10-CM | POA: Diagnosis not present

## 2023-06-18 DIAGNOSIS — R262 Difficulty in walking, not elsewhere classified: Secondary | ICD-10-CM | POA: Diagnosis not present

## 2023-06-18 DIAGNOSIS — S72435D Nondisplaced fracture of medial condyle of left femur, subsequent encounter for closed fracture with routine healing: Secondary | ICD-10-CM | POA: Diagnosis not present

## 2023-06-19 DIAGNOSIS — S72435D Nondisplaced fracture of medial condyle of left femur, subsequent encounter for closed fracture with routine healing: Secondary | ICD-10-CM | POA: Diagnosis not present

## 2023-06-19 DIAGNOSIS — R262 Difficulty in walking, not elsewhere classified: Secondary | ICD-10-CM | POA: Diagnosis not present

## 2023-06-20 DIAGNOSIS — R262 Difficulty in walking, not elsewhere classified: Secondary | ICD-10-CM | POA: Diagnosis not present

## 2023-06-20 DIAGNOSIS — S72435D Nondisplaced fracture of medial condyle of left femur, subsequent encounter for closed fracture with routine healing: Secondary | ICD-10-CM | POA: Diagnosis not present

## 2023-06-22 DIAGNOSIS — Z741 Need for assistance with personal care: Secondary | ICD-10-CM | POA: Diagnosis not present

## 2023-06-22 DIAGNOSIS — M6259 Muscle wasting and atrophy, not elsewhere classified, multiple sites: Secondary | ICD-10-CM | POA: Diagnosis not present

## 2023-06-22 DIAGNOSIS — R2681 Unsteadiness on feet: Secondary | ICD-10-CM | POA: Diagnosis not present

## 2023-06-22 DIAGNOSIS — M6281 Muscle weakness (generalized): Secondary | ICD-10-CM | POA: Diagnosis not present

## 2023-06-22 DIAGNOSIS — I110 Hypertensive heart disease with heart failure: Secondary | ICD-10-CM | POA: Diagnosis not present

## 2023-06-22 DIAGNOSIS — R293 Abnormal posture: Secondary | ICD-10-CM | POA: Diagnosis not present

## 2023-06-22 DIAGNOSIS — E1169 Type 2 diabetes mellitus with other specified complication: Secondary | ICD-10-CM | POA: Diagnosis not present

## 2023-06-22 DIAGNOSIS — S72435D Nondisplaced fracture of medial condyle of left femur, subsequent encounter for closed fracture with routine healing: Secondary | ICD-10-CM | POA: Diagnosis not present

## 2023-06-22 DIAGNOSIS — R2689 Other abnormalities of gait and mobility: Secondary | ICD-10-CM | POA: Diagnosis not present

## 2023-07-09 DIAGNOSIS — R2681 Unsteadiness on feet: Secondary | ICD-10-CM | POA: Diagnosis not present

## 2023-07-09 DIAGNOSIS — I110 Hypertensive heart disease with heart failure: Secondary | ICD-10-CM | POA: Diagnosis not present

## 2023-07-09 DIAGNOSIS — Z741 Need for assistance with personal care: Secondary | ICD-10-CM | POA: Diagnosis not present

## 2023-07-09 DIAGNOSIS — M6281 Muscle weakness (generalized): Secondary | ICD-10-CM | POA: Diagnosis not present

## 2023-07-09 DIAGNOSIS — M6259 Muscle wasting and atrophy, not elsewhere classified, multiple sites: Secondary | ICD-10-CM | POA: Diagnosis not present

## 2023-07-09 DIAGNOSIS — R293 Abnormal posture: Secondary | ICD-10-CM | POA: Diagnosis not present

## 2023-07-09 DIAGNOSIS — R2689 Other abnormalities of gait and mobility: Secondary | ICD-10-CM | POA: Diagnosis not present

## 2023-07-09 DIAGNOSIS — E1169 Type 2 diabetes mellitus with other specified complication: Secondary | ICD-10-CM | POA: Diagnosis not present

## 2023-07-09 DIAGNOSIS — S72435D Nondisplaced fracture of medial condyle of left femur, subsequent encounter for closed fracture with routine healing: Secondary | ICD-10-CM | POA: Diagnosis not present

## 2023-07-10 DIAGNOSIS — R2681 Unsteadiness on feet: Secondary | ICD-10-CM | POA: Diagnosis not present

## 2023-07-10 DIAGNOSIS — E1169 Type 2 diabetes mellitus with other specified complication: Secondary | ICD-10-CM | POA: Diagnosis not present

## 2023-07-10 DIAGNOSIS — M6259 Muscle wasting and atrophy, not elsewhere classified, multiple sites: Secondary | ICD-10-CM | POA: Diagnosis not present

## 2023-07-10 DIAGNOSIS — S72435D Nondisplaced fracture of medial condyle of left femur, subsequent encounter for closed fracture with routine healing: Secondary | ICD-10-CM | POA: Diagnosis not present

## 2023-07-10 DIAGNOSIS — Z741 Need for assistance with personal care: Secondary | ICD-10-CM | POA: Diagnosis not present

## 2023-07-10 DIAGNOSIS — R293 Abnormal posture: Secondary | ICD-10-CM | POA: Diagnosis not present

## 2023-07-10 DIAGNOSIS — I110 Hypertensive heart disease with heart failure: Secondary | ICD-10-CM | POA: Diagnosis not present

## 2023-07-10 DIAGNOSIS — M6281 Muscle weakness (generalized): Secondary | ICD-10-CM | POA: Diagnosis not present

## 2023-07-10 DIAGNOSIS — R2689 Other abnormalities of gait and mobility: Secondary | ICD-10-CM | POA: Diagnosis not present

## 2023-07-13 DIAGNOSIS — E1140 Type 2 diabetes mellitus with diabetic neuropathy, unspecified: Secondary | ICD-10-CM | POA: Diagnosis not present

## 2023-07-13 DIAGNOSIS — R262 Difficulty in walking, not elsewhere classified: Secondary | ICD-10-CM | POA: Diagnosis not present

## 2023-07-13 DIAGNOSIS — Z741 Need for assistance with personal care: Secondary | ICD-10-CM | POA: Diagnosis not present

## 2023-07-13 DIAGNOSIS — E1169 Type 2 diabetes mellitus with other specified complication: Secondary | ICD-10-CM | POA: Diagnosis not present

## 2023-07-13 DIAGNOSIS — R293 Abnormal posture: Secondary | ICD-10-CM | POA: Diagnosis not present

## 2023-07-13 DIAGNOSIS — I10 Essential (primary) hypertension: Secondary | ICD-10-CM | POA: Diagnosis not present

## 2023-07-13 DIAGNOSIS — I5032 Chronic diastolic (congestive) heart failure: Secondary | ICD-10-CM | POA: Diagnosis not present

## 2023-07-13 DIAGNOSIS — S72435D Nondisplaced fracture of medial condyle of left femur, subsequent encounter for closed fracture with routine healing: Secondary | ICD-10-CM | POA: Diagnosis not present

## 2023-07-13 DIAGNOSIS — R278 Other lack of coordination: Secondary | ICD-10-CM | POA: Diagnosis not present

## 2023-07-13 DIAGNOSIS — M6259 Muscle wasting and atrophy, not elsewhere classified, multiple sites: Secondary | ICD-10-CM | POA: Diagnosis not present

## 2023-07-14 DIAGNOSIS — R569 Unspecified convulsions: Secondary | ICD-10-CM | POA: Diagnosis not present

## 2023-07-15 DIAGNOSIS — F5101 Primary insomnia: Secondary | ICD-10-CM | POA: Diagnosis not present

## 2023-07-24 DIAGNOSIS — S72435D Nondisplaced fracture of medial condyle of left femur, subsequent encounter for closed fracture with routine healing: Secondary | ICD-10-CM | POA: Diagnosis not present

## 2023-07-24 DIAGNOSIS — M6281 Muscle weakness (generalized): Secondary | ICD-10-CM | POA: Diagnosis not present

## 2023-07-24 DIAGNOSIS — E1169 Type 2 diabetes mellitus with other specified complication: Secondary | ICD-10-CM | POA: Diagnosis not present

## 2023-07-24 DIAGNOSIS — R2689 Other abnormalities of gait and mobility: Secondary | ICD-10-CM | POA: Diagnosis not present

## 2023-07-24 DIAGNOSIS — I110 Hypertensive heart disease with heart failure: Secondary | ICD-10-CM | POA: Diagnosis not present

## 2023-07-24 DIAGNOSIS — R2681 Unsteadiness on feet: Secondary | ICD-10-CM | POA: Diagnosis not present

## 2023-07-24 DIAGNOSIS — R293 Abnormal posture: Secondary | ICD-10-CM | POA: Diagnosis not present

## 2023-07-24 DIAGNOSIS — M6259 Muscle wasting and atrophy, not elsewhere classified, multiple sites: Secondary | ICD-10-CM | POA: Diagnosis not present

## 2023-07-24 DIAGNOSIS — Z741 Need for assistance with personal care: Secondary | ICD-10-CM | POA: Diagnosis not present

## 2023-07-28 DIAGNOSIS — Z961 Presence of intraocular lens: Secondary | ICD-10-CM | POA: Diagnosis not present

## 2023-07-28 DIAGNOSIS — E119 Type 2 diabetes mellitus without complications: Secondary | ICD-10-CM | POA: Diagnosis not present

## 2023-08-05 DIAGNOSIS — R2681 Unsteadiness on feet: Secondary | ICD-10-CM | POA: Diagnosis not present

## 2023-08-05 DIAGNOSIS — E1169 Type 2 diabetes mellitus with other specified complication: Secondary | ICD-10-CM | POA: Diagnosis not present

## 2023-08-05 DIAGNOSIS — M6259 Muscle wasting and atrophy, not elsewhere classified, multiple sites: Secondary | ICD-10-CM | POA: Diagnosis not present

## 2023-08-05 DIAGNOSIS — R293 Abnormal posture: Secondary | ICD-10-CM | POA: Diagnosis not present

## 2023-08-05 DIAGNOSIS — M6281 Muscle weakness (generalized): Secondary | ICD-10-CM | POA: Diagnosis not present

## 2023-08-05 DIAGNOSIS — R2689 Other abnormalities of gait and mobility: Secondary | ICD-10-CM | POA: Diagnosis not present

## 2023-08-05 DIAGNOSIS — S72435D Nondisplaced fracture of medial condyle of left femur, subsequent encounter for closed fracture with routine healing: Secondary | ICD-10-CM | POA: Diagnosis not present

## 2023-08-05 DIAGNOSIS — I110 Hypertensive heart disease with heart failure: Secondary | ICD-10-CM | POA: Diagnosis not present

## 2023-08-05 DIAGNOSIS — Z741 Need for assistance with personal care: Secondary | ICD-10-CM | POA: Diagnosis not present

## 2023-08-07 DIAGNOSIS — S72435D Nondisplaced fracture of medial condyle of left femur, subsequent encounter for closed fracture with routine healing: Secondary | ICD-10-CM | POA: Diagnosis not present

## 2023-08-07 DIAGNOSIS — R2681 Unsteadiness on feet: Secondary | ICD-10-CM | POA: Diagnosis not present

## 2023-08-07 DIAGNOSIS — L84 Corns and callosities: Secondary | ICD-10-CM | POA: Diagnosis not present

## 2023-08-07 DIAGNOSIS — M6259 Muscle wasting and atrophy, not elsewhere classified, multiple sites: Secondary | ICD-10-CM | POA: Diagnosis not present

## 2023-08-07 DIAGNOSIS — Z794 Long term (current) use of insulin: Secondary | ICD-10-CM | POA: Diagnosis not present

## 2023-08-07 DIAGNOSIS — E1151 Type 2 diabetes mellitus with diabetic peripheral angiopathy without gangrene: Secondary | ICD-10-CM | POA: Diagnosis not present

## 2023-08-07 DIAGNOSIS — I110 Hypertensive heart disease with heart failure: Secondary | ICD-10-CM | POA: Diagnosis not present

## 2023-08-07 DIAGNOSIS — E1169 Type 2 diabetes mellitus with other specified complication: Secondary | ICD-10-CM | POA: Diagnosis not present

## 2023-08-07 DIAGNOSIS — L602 Onychogryphosis: Secondary | ICD-10-CM | POA: Diagnosis not present

## 2023-08-07 DIAGNOSIS — Z741 Need for assistance with personal care: Secondary | ICD-10-CM | POA: Diagnosis not present

## 2023-08-07 DIAGNOSIS — R293 Abnormal posture: Secondary | ICD-10-CM | POA: Diagnosis not present

## 2023-08-07 DIAGNOSIS — R2689 Other abnormalities of gait and mobility: Secondary | ICD-10-CM | POA: Diagnosis not present

## 2023-08-07 DIAGNOSIS — L603 Nail dystrophy: Secondary | ICD-10-CM | POA: Diagnosis not present

## 2023-08-07 DIAGNOSIS — M6281 Muscle weakness (generalized): Secondary | ICD-10-CM | POA: Diagnosis not present

## 2023-08-09 DIAGNOSIS — Z741 Need for assistance with personal care: Secondary | ICD-10-CM | POA: Diagnosis not present

## 2023-08-09 DIAGNOSIS — I5032 Chronic diastolic (congestive) heart failure: Secondary | ICD-10-CM | POA: Diagnosis not present

## 2023-08-09 DIAGNOSIS — R262 Difficulty in walking, not elsewhere classified: Secondary | ICD-10-CM | POA: Diagnosis not present

## 2023-08-09 DIAGNOSIS — I10 Essential (primary) hypertension: Secondary | ICD-10-CM | POA: Diagnosis not present

## 2023-08-09 DIAGNOSIS — R278 Other lack of coordination: Secondary | ICD-10-CM | POA: Diagnosis not present

## 2023-08-09 DIAGNOSIS — M6259 Muscle wasting and atrophy, not elsewhere classified, multiple sites: Secondary | ICD-10-CM | POA: Diagnosis not present

## 2023-08-09 DIAGNOSIS — S72435D Nondisplaced fracture of medial condyle of left femur, subsequent encounter for closed fracture with routine healing: Secondary | ICD-10-CM | POA: Diagnosis not present

## 2023-08-09 DIAGNOSIS — R293 Abnormal posture: Secondary | ICD-10-CM | POA: Diagnosis not present

## 2023-08-09 DIAGNOSIS — E1140 Type 2 diabetes mellitus with diabetic neuropathy, unspecified: Secondary | ICD-10-CM | POA: Diagnosis not present

## 2023-08-09 DIAGNOSIS — E1169 Type 2 diabetes mellitus with other specified complication: Secondary | ICD-10-CM | POA: Diagnosis not present

## 2023-08-12 DIAGNOSIS — F5101 Primary insomnia: Secondary | ICD-10-CM | POA: Diagnosis not present

## 2023-08-13 DIAGNOSIS — H40013 Open angle with borderline findings, low risk, bilateral: Secondary | ICD-10-CM | POA: Diagnosis not present

## 2023-08-13 DIAGNOSIS — H524 Presbyopia: Secondary | ICD-10-CM | POA: Diagnosis not present

## 2023-08-13 DIAGNOSIS — E119 Type 2 diabetes mellitus without complications: Secondary | ICD-10-CM | POA: Diagnosis not present

## 2023-08-13 DIAGNOSIS — H04123 Dry eye syndrome of bilateral lacrimal glands: Secondary | ICD-10-CM | POA: Diagnosis not present

## 2023-08-13 DIAGNOSIS — H35033 Hypertensive retinopathy, bilateral: Secondary | ICD-10-CM | POA: Diagnosis not present

## 2023-08-14 DIAGNOSIS — R2689 Other abnormalities of gait and mobility: Secondary | ICD-10-CM | POA: Diagnosis not present

## 2023-08-14 DIAGNOSIS — I110 Hypertensive heart disease with heart failure: Secondary | ICD-10-CM | POA: Diagnosis not present

## 2023-08-14 DIAGNOSIS — Z741 Need for assistance with personal care: Secondary | ICD-10-CM | POA: Diagnosis not present

## 2023-08-14 DIAGNOSIS — S72435D Nondisplaced fracture of medial condyle of left femur, subsequent encounter for closed fracture with routine healing: Secondary | ICD-10-CM | POA: Diagnosis not present

## 2023-08-14 DIAGNOSIS — M6259 Muscle wasting and atrophy, not elsewhere classified, multiple sites: Secondary | ICD-10-CM | POA: Diagnosis not present

## 2023-08-14 DIAGNOSIS — M6281 Muscle weakness (generalized): Secondary | ICD-10-CM | POA: Diagnosis not present

## 2023-08-14 DIAGNOSIS — R2681 Unsteadiness on feet: Secondary | ICD-10-CM | POA: Diagnosis not present

## 2023-08-14 DIAGNOSIS — E1169 Type 2 diabetes mellitus with other specified complication: Secondary | ICD-10-CM | POA: Diagnosis not present

## 2023-08-14 DIAGNOSIS — R293 Abnormal posture: Secondary | ICD-10-CM | POA: Diagnosis not present

## 2023-08-25 ENCOUNTER — Ambulatory Visit (INDEPENDENT_AMBULATORY_CARE_PROVIDER_SITE_OTHER): Admitting: Podiatry

## 2023-08-25 ENCOUNTER — Encounter: Payer: Self-pay | Admitting: Podiatry

## 2023-08-25 DIAGNOSIS — E1159 Type 2 diabetes mellitus with other circulatory complications: Secondary | ICD-10-CM

## 2023-08-25 DIAGNOSIS — B351 Tinea unguium: Secondary | ICD-10-CM

## 2023-08-25 DIAGNOSIS — M79609 Pain in unspecified limb: Secondary | ICD-10-CM | POA: Diagnosis not present

## 2023-08-25 NOTE — Progress Notes (Signed)
This patient returns to my office for at risk foot care.  This patient requires this care by a professional since this patient will be at risk due to having CKD and diabetes.  This patient is unable to cut nails himself since the patient cannot reach his nails.These nails are painful walking and wearing shoes. This patient presents to the office in a wheelchair.  This patient presents for at risk foot care today.  General Appearance  Alert, conversant and in no acute stress.  Vascular  Dorsalis pedis and posterior tibial  pulses are weakly  palpable  bilaterally.  Capillary return is within normal limits  bilaterally. Cold feet  Bilaterally. Absent digital hair B/L.  Neurologic  Senn-Weinstein monofilament wire test diminished   bilaterally. Muscle power within normal limits bilaterally.  Nails Thick disfigured discolored nails with subungual debris  from hallux to fifth toes bilaterally. No evidence of bacterial infection or drainage bilaterally.  Orthopedic  No limitations of motion  feet .  No crepitus or effusions noted.  Contracted digits  B/L.  Skin  normotropic skin with no porokeratosis noted bilaterally.  No signs of infections or ulcers noted.     Onychomycosis  Pain in right toes  Pain in left toes  Consent was obtained for treatment procedures.   Mechanical debridement of nails 1-5  bilaterally performed with a nail nipper.    RTC 3 months.     Return office visit   3 months                  Told patient to return for periodic foot care and evaluation due to potential at risk complications.   Gregory Mayer DPM  

## 2023-08-26 DIAGNOSIS — M6281 Muscle weakness (generalized): Secondary | ICD-10-CM | POA: Diagnosis not present

## 2023-08-26 DIAGNOSIS — E1169 Type 2 diabetes mellitus with other specified complication: Secondary | ICD-10-CM | POA: Diagnosis not present

## 2023-08-26 DIAGNOSIS — R2689 Other abnormalities of gait and mobility: Secondary | ICD-10-CM | POA: Diagnosis not present

## 2023-08-26 DIAGNOSIS — I110 Hypertensive heart disease with heart failure: Secondary | ICD-10-CM | POA: Diagnosis not present

## 2023-08-26 DIAGNOSIS — R293 Abnormal posture: Secondary | ICD-10-CM | POA: Diagnosis not present

## 2023-08-26 DIAGNOSIS — Z741 Need for assistance with personal care: Secondary | ICD-10-CM | POA: Diagnosis not present

## 2023-08-26 DIAGNOSIS — R2681 Unsteadiness on feet: Secondary | ICD-10-CM | POA: Diagnosis not present

## 2023-08-26 DIAGNOSIS — M6259 Muscle wasting and atrophy, not elsewhere classified, multiple sites: Secondary | ICD-10-CM | POA: Diagnosis not present

## 2023-09-03 DIAGNOSIS — E1169 Type 2 diabetes mellitus with other specified complication: Secondary | ICD-10-CM | POA: Diagnosis not present

## 2023-09-03 DIAGNOSIS — R278 Other lack of coordination: Secondary | ICD-10-CM | POA: Diagnosis not present

## 2023-09-03 DIAGNOSIS — E1140 Type 2 diabetes mellitus with diabetic neuropathy, unspecified: Secondary | ICD-10-CM | POA: Diagnosis not present

## 2023-09-03 DIAGNOSIS — S72435D Nondisplaced fracture of medial condyle of left femur, subsequent encounter for closed fracture with routine healing: Secondary | ICD-10-CM | POA: Diagnosis not present

## 2023-09-03 DIAGNOSIS — R293 Abnormal posture: Secondary | ICD-10-CM | POA: Diagnosis not present

## 2023-09-03 DIAGNOSIS — I10 Essential (primary) hypertension: Secondary | ICD-10-CM | POA: Diagnosis not present

## 2023-09-03 DIAGNOSIS — I5032 Chronic diastolic (congestive) heart failure: Secondary | ICD-10-CM | POA: Diagnosis not present

## 2023-09-03 DIAGNOSIS — R262 Difficulty in walking, not elsewhere classified: Secondary | ICD-10-CM | POA: Diagnosis not present

## 2023-09-03 DIAGNOSIS — M6259 Muscle wasting and atrophy, not elsewhere classified, multiple sites: Secondary | ICD-10-CM | POA: Diagnosis not present

## 2023-09-03 DIAGNOSIS — Z741 Need for assistance with personal care: Secondary | ICD-10-CM | POA: Diagnosis not present

## 2023-09-04 DIAGNOSIS — E1169 Type 2 diabetes mellitus with other specified complication: Secondary | ICD-10-CM | POA: Diagnosis not present

## 2023-09-04 DIAGNOSIS — I5032 Chronic diastolic (congestive) heart failure: Secondary | ICD-10-CM | POA: Diagnosis not present

## 2023-09-04 DIAGNOSIS — M6259 Muscle wasting and atrophy, not elsewhere classified, multiple sites: Secondary | ICD-10-CM | POA: Diagnosis not present

## 2023-09-04 DIAGNOSIS — E1140 Type 2 diabetes mellitus with diabetic neuropathy, unspecified: Secondary | ICD-10-CM | POA: Diagnosis not present

## 2023-09-04 DIAGNOSIS — R293 Abnormal posture: Secondary | ICD-10-CM | POA: Diagnosis not present

## 2023-09-04 DIAGNOSIS — R278 Other lack of coordination: Secondary | ICD-10-CM | POA: Diagnosis not present

## 2023-09-04 DIAGNOSIS — Z741 Need for assistance with personal care: Secondary | ICD-10-CM | POA: Diagnosis not present

## 2023-09-04 DIAGNOSIS — G4700 Insomnia, unspecified: Secondary | ICD-10-CM | POA: Diagnosis not present

## 2023-09-04 DIAGNOSIS — I10 Essential (primary) hypertension: Secondary | ICD-10-CM | POA: Diagnosis not present

## 2023-09-04 DIAGNOSIS — R262 Difficulty in walking, not elsewhere classified: Secondary | ICD-10-CM | POA: Diagnosis not present

## 2023-09-07 DIAGNOSIS — M6259 Muscle wasting and atrophy, not elsewhere classified, multiple sites: Secondary | ICD-10-CM | POA: Diagnosis not present

## 2023-09-07 DIAGNOSIS — I10 Essential (primary) hypertension: Secondary | ICD-10-CM | POA: Diagnosis not present

## 2023-09-07 DIAGNOSIS — R293 Abnormal posture: Secondary | ICD-10-CM | POA: Diagnosis not present

## 2023-09-07 DIAGNOSIS — E1140 Type 2 diabetes mellitus with diabetic neuropathy, unspecified: Secondary | ICD-10-CM | POA: Diagnosis not present

## 2023-09-07 DIAGNOSIS — S72435D Nondisplaced fracture of medial condyle of left femur, subsequent encounter for closed fracture with routine healing: Secondary | ICD-10-CM | POA: Diagnosis not present

## 2023-09-07 DIAGNOSIS — I5032 Chronic diastolic (congestive) heart failure: Secondary | ICD-10-CM | POA: Diagnosis not present

## 2023-09-07 DIAGNOSIS — R262 Difficulty in walking, not elsewhere classified: Secondary | ICD-10-CM | POA: Diagnosis not present

## 2023-09-07 DIAGNOSIS — R278 Other lack of coordination: Secondary | ICD-10-CM | POA: Diagnosis not present

## 2023-09-07 DIAGNOSIS — E1169 Type 2 diabetes mellitus with other specified complication: Secondary | ICD-10-CM | POA: Diagnosis not present

## 2023-09-07 DIAGNOSIS — Z741 Need for assistance with personal care: Secondary | ICD-10-CM | POA: Diagnosis not present

## 2023-09-09 DIAGNOSIS — F5101 Primary insomnia: Secondary | ICD-10-CM | POA: Diagnosis not present

## 2023-09-11 DIAGNOSIS — R262 Difficulty in walking, not elsewhere classified: Secondary | ICD-10-CM | POA: Diagnosis not present

## 2023-09-11 DIAGNOSIS — S72435D Nondisplaced fracture of medial condyle of left femur, subsequent encounter for closed fracture with routine healing: Secondary | ICD-10-CM | POA: Diagnosis not present

## 2023-09-11 DIAGNOSIS — R278 Other lack of coordination: Secondary | ICD-10-CM | POA: Diagnosis not present

## 2023-09-11 DIAGNOSIS — Z741 Need for assistance with personal care: Secondary | ICD-10-CM | POA: Diagnosis not present

## 2023-09-11 DIAGNOSIS — Z13220 Encounter for screening for lipoid disorders: Secondary | ICD-10-CM | POA: Diagnosis not present

## 2023-09-11 DIAGNOSIS — E7800 Pure hypercholesterolemia, unspecified: Secondary | ICD-10-CM | POA: Diagnosis not present

## 2023-09-11 DIAGNOSIS — M6259 Muscle wasting and atrophy, not elsewhere classified, multiple sites: Secondary | ICD-10-CM | POA: Diagnosis not present

## 2023-09-11 DIAGNOSIS — E1169 Type 2 diabetes mellitus with other specified complication: Secondary | ICD-10-CM | POA: Diagnosis not present

## 2023-09-11 DIAGNOSIS — R293 Abnormal posture: Secondary | ICD-10-CM | POA: Diagnosis not present

## 2023-09-11 DIAGNOSIS — I110 Hypertensive heart disease with heart failure: Secondary | ICD-10-CM | POA: Diagnosis not present

## 2023-09-11 DIAGNOSIS — I5032 Chronic diastolic (congestive) heart failure: Secondary | ICD-10-CM | POA: Diagnosis not present

## 2023-09-11 DIAGNOSIS — E1140 Type 2 diabetes mellitus with diabetic neuropathy, unspecified: Secondary | ICD-10-CM | POA: Diagnosis not present

## 2023-09-15 ENCOUNTER — Ambulatory Visit: Admitting: Psychiatry

## 2023-10-03 DIAGNOSIS — Z741 Need for assistance with personal care: Secondary | ICD-10-CM | POA: Diagnosis not present

## 2023-10-03 DIAGNOSIS — M6259 Muscle wasting and atrophy, not elsewhere classified, multiple sites: Secondary | ICD-10-CM | POA: Diagnosis not present

## 2023-10-03 DIAGNOSIS — E1169 Type 2 diabetes mellitus with other specified complication: Secondary | ICD-10-CM | POA: Diagnosis not present

## 2023-10-03 DIAGNOSIS — I5032 Chronic diastolic (congestive) heart failure: Secondary | ICD-10-CM | POA: Diagnosis not present

## 2023-10-03 DIAGNOSIS — I10 Essential (primary) hypertension: Secondary | ICD-10-CM | POA: Diagnosis not present

## 2023-10-03 DIAGNOSIS — R262 Difficulty in walking, not elsewhere classified: Secondary | ICD-10-CM | POA: Diagnosis not present

## 2023-10-03 DIAGNOSIS — R278 Other lack of coordination: Secondary | ICD-10-CM | POA: Diagnosis not present

## 2023-10-03 DIAGNOSIS — G4700 Insomnia, unspecified: Secondary | ICD-10-CM | POA: Diagnosis not present

## 2023-10-03 DIAGNOSIS — E1140 Type 2 diabetes mellitus with diabetic neuropathy, unspecified: Secondary | ICD-10-CM | POA: Diagnosis not present

## 2023-10-03 DIAGNOSIS — R293 Abnormal posture: Secondary | ICD-10-CM | POA: Diagnosis not present

## 2023-10-06 DIAGNOSIS — I10 Essential (primary) hypertension: Secondary | ICD-10-CM | POA: Diagnosis not present

## 2023-10-06 DIAGNOSIS — E1140 Type 2 diabetes mellitus with diabetic neuropathy, unspecified: Secondary | ICD-10-CM | POA: Diagnosis not present

## 2023-10-06 DIAGNOSIS — E1169 Type 2 diabetes mellitus with other specified complication: Secondary | ICD-10-CM | POA: Diagnosis not present

## 2023-10-06 DIAGNOSIS — I5032 Chronic diastolic (congestive) heart failure: Secondary | ICD-10-CM | POA: Diagnosis not present

## 2023-10-06 DIAGNOSIS — R278 Other lack of coordination: Secondary | ICD-10-CM | POA: Diagnosis not present

## 2023-10-06 DIAGNOSIS — M6259 Muscle wasting and atrophy, not elsewhere classified, multiple sites: Secondary | ICD-10-CM | POA: Diagnosis not present

## 2023-10-06 DIAGNOSIS — Z741 Need for assistance with personal care: Secondary | ICD-10-CM | POA: Diagnosis not present

## 2023-10-06 DIAGNOSIS — G4700 Insomnia, unspecified: Secondary | ICD-10-CM | POA: Diagnosis not present

## 2023-10-06 DIAGNOSIS — R262 Difficulty in walking, not elsewhere classified: Secondary | ICD-10-CM | POA: Diagnosis not present

## 2023-10-06 DIAGNOSIS — R293 Abnormal posture: Secondary | ICD-10-CM | POA: Diagnosis not present

## 2023-10-07 DIAGNOSIS — I5032 Chronic diastolic (congestive) heart failure: Secondary | ICD-10-CM | POA: Diagnosis not present

## 2023-10-07 DIAGNOSIS — Z741 Need for assistance with personal care: Secondary | ICD-10-CM | POA: Diagnosis not present

## 2023-10-07 DIAGNOSIS — R293 Abnormal posture: Secondary | ICD-10-CM | POA: Diagnosis not present

## 2023-10-07 DIAGNOSIS — R278 Other lack of coordination: Secondary | ICD-10-CM | POA: Diagnosis not present

## 2023-10-07 DIAGNOSIS — R262 Difficulty in walking, not elsewhere classified: Secondary | ICD-10-CM | POA: Diagnosis not present

## 2023-10-07 DIAGNOSIS — M6259 Muscle wasting and atrophy, not elsewhere classified, multiple sites: Secondary | ICD-10-CM | POA: Diagnosis not present

## 2023-10-07 DIAGNOSIS — G4700 Insomnia, unspecified: Secondary | ICD-10-CM | POA: Diagnosis not present

## 2023-10-07 DIAGNOSIS — I10 Essential (primary) hypertension: Secondary | ICD-10-CM | POA: Diagnosis not present

## 2023-10-07 DIAGNOSIS — E1169 Type 2 diabetes mellitus with other specified complication: Secondary | ICD-10-CM | POA: Diagnosis not present

## 2023-10-07 DIAGNOSIS — E1140 Type 2 diabetes mellitus with diabetic neuropathy, unspecified: Secondary | ICD-10-CM | POA: Diagnosis not present

## 2023-10-08 DIAGNOSIS — E1140 Type 2 diabetes mellitus with diabetic neuropathy, unspecified: Secondary | ICD-10-CM | POA: Diagnosis not present

## 2023-10-08 DIAGNOSIS — R262 Difficulty in walking, not elsewhere classified: Secondary | ICD-10-CM | POA: Diagnosis not present

## 2023-10-08 DIAGNOSIS — R293 Abnormal posture: Secondary | ICD-10-CM | POA: Diagnosis not present

## 2023-10-08 DIAGNOSIS — L603 Nail dystrophy: Secondary | ICD-10-CM | POA: Diagnosis not present

## 2023-10-08 DIAGNOSIS — E1169 Type 2 diabetes mellitus with other specified complication: Secondary | ICD-10-CM | POA: Diagnosis not present

## 2023-10-08 DIAGNOSIS — E1151 Type 2 diabetes mellitus with diabetic peripheral angiopathy without gangrene: Secondary | ICD-10-CM | POA: Diagnosis not present

## 2023-10-08 DIAGNOSIS — L602 Onychogryphosis: Secondary | ICD-10-CM | POA: Diagnosis not present

## 2023-10-08 DIAGNOSIS — I10 Essential (primary) hypertension: Secondary | ICD-10-CM | POA: Diagnosis not present

## 2023-10-08 DIAGNOSIS — R278 Other lack of coordination: Secondary | ICD-10-CM | POA: Diagnosis not present

## 2023-10-08 DIAGNOSIS — Z741 Need for assistance with personal care: Secondary | ICD-10-CM | POA: Diagnosis not present

## 2023-10-08 DIAGNOSIS — M6259 Muscle wasting and atrophy, not elsewhere classified, multiple sites: Secondary | ICD-10-CM | POA: Diagnosis not present

## 2023-10-08 DIAGNOSIS — I5032 Chronic diastolic (congestive) heart failure: Secondary | ICD-10-CM | POA: Diagnosis not present

## 2023-10-08 DIAGNOSIS — L84 Corns and callosities: Secondary | ICD-10-CM | POA: Diagnosis not present

## 2023-10-08 DIAGNOSIS — Z794 Long term (current) use of insulin: Secondary | ICD-10-CM | POA: Diagnosis not present

## 2023-10-08 DIAGNOSIS — G4700 Insomnia, unspecified: Secondary | ICD-10-CM | POA: Diagnosis not present

## 2023-10-09 DIAGNOSIS — Z741 Need for assistance with personal care: Secondary | ICD-10-CM | POA: Diagnosis not present

## 2023-10-09 DIAGNOSIS — E1140 Type 2 diabetes mellitus with diabetic neuropathy, unspecified: Secondary | ICD-10-CM | POA: Diagnosis not present

## 2023-10-09 DIAGNOSIS — I10 Essential (primary) hypertension: Secondary | ICD-10-CM | POA: Diagnosis not present

## 2023-10-09 DIAGNOSIS — R262 Difficulty in walking, not elsewhere classified: Secondary | ICD-10-CM | POA: Diagnosis not present

## 2023-10-09 DIAGNOSIS — E1169 Type 2 diabetes mellitus with other specified complication: Secondary | ICD-10-CM | POA: Diagnosis not present

## 2023-10-09 DIAGNOSIS — M6259 Muscle wasting and atrophy, not elsewhere classified, multiple sites: Secondary | ICD-10-CM | POA: Diagnosis not present

## 2023-10-09 DIAGNOSIS — R293 Abnormal posture: Secondary | ICD-10-CM | POA: Diagnosis not present

## 2023-10-09 DIAGNOSIS — R278 Other lack of coordination: Secondary | ICD-10-CM | POA: Diagnosis not present

## 2023-10-09 DIAGNOSIS — I5032 Chronic diastolic (congestive) heart failure: Secondary | ICD-10-CM | POA: Diagnosis not present

## 2023-10-09 DIAGNOSIS — G4700 Insomnia, unspecified: Secondary | ICD-10-CM | POA: Diagnosis not present

## 2023-10-11 DIAGNOSIS — I5032 Chronic diastolic (congestive) heart failure: Secondary | ICD-10-CM | POA: Diagnosis not present

## 2023-10-11 DIAGNOSIS — E1140 Type 2 diabetes mellitus with diabetic neuropathy, unspecified: Secondary | ICD-10-CM | POA: Diagnosis not present

## 2023-10-11 DIAGNOSIS — M6259 Muscle wasting and atrophy, not elsewhere classified, multiple sites: Secondary | ICD-10-CM | POA: Diagnosis not present

## 2023-10-11 DIAGNOSIS — R262 Difficulty in walking, not elsewhere classified: Secondary | ICD-10-CM | POA: Diagnosis not present

## 2023-10-11 DIAGNOSIS — R278 Other lack of coordination: Secondary | ICD-10-CM | POA: Diagnosis not present

## 2023-10-11 DIAGNOSIS — I10 Essential (primary) hypertension: Secondary | ICD-10-CM | POA: Diagnosis not present

## 2023-10-11 DIAGNOSIS — E1169 Type 2 diabetes mellitus with other specified complication: Secondary | ICD-10-CM | POA: Diagnosis not present

## 2023-10-11 DIAGNOSIS — R293 Abnormal posture: Secondary | ICD-10-CM | POA: Diagnosis not present

## 2023-10-11 DIAGNOSIS — Z741 Need for assistance with personal care: Secondary | ICD-10-CM | POA: Diagnosis not present

## 2023-10-11 DIAGNOSIS — G4700 Insomnia, unspecified: Secondary | ICD-10-CM | POA: Diagnosis not present

## 2023-10-13 DIAGNOSIS — E1140 Type 2 diabetes mellitus with diabetic neuropathy, unspecified: Secondary | ICD-10-CM | POA: Diagnosis not present

## 2023-10-13 DIAGNOSIS — I5032 Chronic diastolic (congestive) heart failure: Secondary | ICD-10-CM | POA: Diagnosis not present

## 2023-10-13 DIAGNOSIS — I10 Essential (primary) hypertension: Secondary | ICD-10-CM | POA: Diagnosis not present

## 2023-10-13 DIAGNOSIS — M6259 Muscle wasting and atrophy, not elsewhere classified, multiple sites: Secondary | ICD-10-CM | POA: Diagnosis not present

## 2023-10-13 DIAGNOSIS — S72435D Nondisplaced fracture of medial condyle of left femur, subsequent encounter for closed fracture with routine healing: Secondary | ICD-10-CM | POA: Diagnosis not present

## 2023-10-13 DIAGNOSIS — R262 Difficulty in walking, not elsewhere classified: Secondary | ICD-10-CM | POA: Diagnosis not present

## 2023-10-13 DIAGNOSIS — R293 Abnormal posture: Secondary | ICD-10-CM | POA: Diagnosis not present

## 2023-10-13 DIAGNOSIS — Z741 Need for assistance with personal care: Secondary | ICD-10-CM | POA: Diagnosis not present

## 2023-10-13 DIAGNOSIS — R278 Other lack of coordination: Secondary | ICD-10-CM | POA: Diagnosis not present

## 2023-10-13 DIAGNOSIS — E1169 Type 2 diabetes mellitus with other specified complication: Secondary | ICD-10-CM | POA: Diagnosis not present

## 2023-10-21 DIAGNOSIS — E559 Vitamin D deficiency, unspecified: Secondary | ICD-10-CM | POA: Diagnosis not present

## 2023-11-04 ENCOUNTER — Ambulatory Visit: Admitting: Psychiatry

## 2023-11-24 ENCOUNTER — Ambulatory Visit: Admitting: Podiatry

## 2024-01-13 ENCOUNTER — Encounter: Payer: Self-pay | Admitting: Psychiatry

## 2024-01-13 ENCOUNTER — Ambulatory Visit (INDEPENDENT_AMBULATORY_CARE_PROVIDER_SITE_OTHER): Admitting: Psychiatry

## 2024-01-13 DIAGNOSIS — F02818 Dementia in other diseases classified elsewhere, unspecified severity, with other behavioral disturbance: Secondary | ICD-10-CM | POA: Diagnosis not present

## 2024-01-13 DIAGNOSIS — F5105 Insomnia due to other mental disorder: Secondary | ICD-10-CM | POA: Diagnosis not present

## 2024-01-13 DIAGNOSIS — F422 Mixed obsessional thoughts and acts: Secondary | ICD-10-CM | POA: Diagnosis not present

## 2024-01-13 DIAGNOSIS — F411 Generalized anxiety disorder: Secondary | ICD-10-CM | POA: Diagnosis not present

## 2024-01-13 DIAGNOSIS — G301 Alzheimer's disease with late onset: Secondary | ICD-10-CM

## 2024-01-13 DIAGNOSIS — F200 Paranoid schizophrenia: Secondary | ICD-10-CM | POA: Diagnosis not present

## 2024-01-13 NOTE — Progress Notes (Signed)
 Ricky [name] [id number] 10/16/1940 83 y.o.     Subjective:   Patient ID:  Ricky [name]. is a 83 y.o. (DOB 1940-01-27) male.  Chief Complaint:  Chief Complaint  Patient presents with   Follow-up   Schizophrenia   Anxiety    Ricky [name]. presents to the office today for follow-up of schizophrenia and dementia.  Resident of [facility].  seen April 15, 2019.  No meds were changed.  As of 06/03/2019 the following is noted: Staff reports patient calls very frequently and has for an extended period of time.  He is chronically somewhat needy and lonely as well as has a tendency for reassurance checking. Still bothered by religious thoughts and can't tell if it's from God than or Satan.  Supernatural things get in his mind.  Also thinks that people there don't like him at [facility].  Also upset at Biden's spending excess.  Wants to vote for [employer].  Also bothered he doesn't get to do much.    Had thoughts he needed to go to the hospital one night for MH reasons but staff encouraged him to wait it out and he felt better the next day. Poor STM. No med changes  9/20/21appt with the following noted: Worries over losing trusted banker.  Worries money is running out.  Worries about getting Covid.  Memory is worse.  Gets obsessive fears about his salvation especially  Listening to [place] radio at times but has chronically worried over it.   Hears voices talking about his mental health and to listen to radio.   Don't have a whole lot of them but enough to get him upset from time to time.. Not markedly depressed. Plan no med changes  02/21/2020 appointment with the following noted: [place] some noise, ring in his head but not voices. Worries about it. They take good care of me over there.  Listens to radio a lot at night. And doesn't get a lot of sleep at night but will nap daytime. Reads bible but often doesn't understand or remember it. No SE. Calls on [facility] to  help him emotionally and spiritually.   LTM good.  More poor STM.    Wonders if it's possible that he'll ever get well.    Watched NFL football yesterday and enjoyed it. Plan: No med changes  07/19/2020 appointment with the following noted: Has had to call couple times after hours bc was ruminating on past issues.  Today feels OK. Recognizes problems with STM but in his heart feels it will get better. Food good at [employer].  People are treating him well except with one man.  Jeralyn avoids him. Still on olanzapine  25 mg HS and fluovxamine 100, zolpidem  5 HS, Xanax  0.25 mg prn. Can't afford caregiver to take him out daily as in the past. Wonders what doc thinks his IQ would have been.   No concerns about med except wants one to make him well.  01/18/2021 appt noted: Still at [facility].  Feels OK about it.   Still ups and downs but overall holding his own.  Doesn't feel 83 yo.  WC bound but can still move himself around independently.  U incontinence. Getting along pretty well with people, but times thinks people don't like him.  Still wonders if he needs psych hospitalization. Notices memory problems. Still worries chronically over his salvation but not continuous and anxiety is no worse. Voices are better but still has intermittent AH.  CO trouble staying asleep but at times can sleep too much No SE noted Doing PT to try and get stronger. Plan: No med changes today. Successfully reduced alprazolam  to 0.25 mg HS Continue olanzapine  25 mg HS for psychosis Continue fluvoxamine  100 mg HS for OCD and anxiety Continue zolpidem  5 mg HS for sleep Consider stopping alprazolam  0.25 mg HS as long as he's sleeping well. Or reducing to 0.125 mg HS.  Dose is already low  07/19/2021 appointment with the following noted: Incontinent of urine in the office. He thinks overall he is doing better.  Not afraid at all.  Some worry.   Realizes he's here for 6 mos followup. But he does recognize some  memory problems.  Sometimes has trouble with use of technology. Not much re: voices except hears noises at times that disturb him.   Not depressed.  Enjoys bingo. Asks for reassurance. No med concerns. Plan: No med changes today. Successfully reduced alprazolam  to 0.25 mg HS Continue olanzapine  25 mg HS for psychosis Continue fluvoxamine  100 mg HS for OCD and anxiety Continue zolpidem  5 mg HS for sleep Consider stopping alprazolam  0.25 mg HS as long as he's sleeping well. Or reducing to 0.125 mg HS.  Dose is already low  01/17/22 appt noted: Reduced alpraZolam  to 0.125 mg nightly and Ambien  to 2.5 mg nightly.  He has continued the other medicines.   He's satisfied living at [facility]. Do you think I've been improved?  Not depressed.  Worry comes and goes. Listens to relaxing radio show at night. He's aware he is not making frequent calls to the office like he used to do. Acknowledges some memory issues. Not as bothered by [facility].   Some worry that he's run out of his trust fund money.   07/15/22 appt noted: More problems since here.  Had u incontinence in the [facility] today.  This is a common problem.  Not wearing depends.   It's been a long 6 mos.  Stressed. He is bored often.   Some trouble dealing with people but tries to make the best of it. Psych meds: off alprazolam .  Fluvoxamine  100 HS, olanzapine  25 mg HS. Zolpidem  2.5 mg HS No SE or med concerns. Some chronic difficulty dealing with people bc often thinks they might not like him.  Gets suspicious. Plan: no changes  01/20/23 appt noted: Psych meds: off alprazolam .  Fluvoxamine  100 HS, olanzapine  25 mg HS. Zolpidem  2.5 mg HS.  Reviewed and confirmed with paperwork from [facility].  Trying to manage DM and complications.   Has a new roommate and doing ok so far.  Roommate asked him to turn off light at night and seems more relaxed with the light off.   Pt aware of age and date.  He feels like he's only about 54-70 years  old.  Not dep.  Still struggles with anxiety over dealing with people and the Bible.  He will worry that he is not responding in the right way to the Bible and worries over his faith.  05/19/23 appt noted: Med as above Has room mate and getting along well now.  Doesn't remember if had room mate before. 83 yo and reads Bible daily and prays.  Will feel afraid if doesn't.  His money ran out of his trust.  But still able to stay at [facility].  Has cousin who is helping some.  He doesn't want to move.   Not dep.  Anxiety about the same.   No SE  or med concerns.   Sleep never has been very good.  Will tend to stay awake.    01/18/24 appt noted: with cousin  Dayla and [place] Psych meds: off alprazolam .  Fluvoxamine  100 HS, olanzapine  25 mg HS. Zolpidem  2.5 mg HS.  Anxiety up worrying about relationship with God.  As in the past.   No changes in meds.  No SE noted. Dealing with other people in facility pretty well.   No concerns about meds.   Denies worriesome voices.  Not sig dep.   Memory issues and doesn't know how long   Past Psychiatric Medication Trials:  some of them are unknown,  haloperidol, Serentil, Stelazine, Moban, perphenazine, risperidone, Thorazine, Seroquel 800 mg, loxapine, been on Zyprexa  since September 2006 and that has produced the best control of his paranoia at 25 mg daily. benztropine,    paroxetine, sertraline side effects,   Hydroxyzine , trazodone no response,  off Xanax , zolpidem .  Review of Systems:  Review of Systems  Constitutional:  Positive for fatigue.  HENT:  Positive for tinnitus.   Cardiovascular:  Negative for chest pain and palpitations.  Genitourinary:  Positive for enuresis. Negative for flank pain.       Urinary incontinence  Musculoskeletal:  Positive for arthralgias, back pain and gait problem.  Neurological:  Positive for dizziness, tremors and weakness. Negative for headaches.       WC bound  Psychiatric/Behavioral:  Positive for confusion,  decreased concentration, hallucinations and sleep disturbance. Negative for agitation, self-injury and suicidal ideas. The patient is nervous/anxious. The patient is not hyperactive.     Medications: I have reviewed the patient's current medications.  Current Outpatient Medications  Medication Sig Dispense Refill   amLODipine  (NORVASC ) 2.5 MG tablet Take 2.5 mg by mouth daily.     Calcium Citrate-Vitamin D  (CITRACAL + D PO) Take 1 tablet by mouth daily.     cholecalciferol  (VITAMIN D3) 25 MCG (1000 UNIT) tablet Take 1,000 Units by mouth daily.     docusate sodium  (COLACE) 100 MG capsule Take 100 mg by mouth 2 (two) times daily.     empagliflozin  (JARDIANCE ) 25 MG TABS tablet Take 25 mg by mouth daily.     fluvoxaMINE  (LUVOX ) 100 MG tablet Take 1 tablet (100 mg total) by mouth at bedtime. 2 tablet 0   furosemide  (LASIX ) 20 MG tablet Take 10 mg by mouth daily before breakfast.     guaiFENesin  (ROBITUSSIN) 100 MG/5ML liquid Take 5 mLs by mouth every 4 (four) hours as needed for cough or to loosen phlegm.     HUMALOG KWIKPEN 100 UNIT/ML KiwkPen Inject 2-11 Units into the skin See admin instructions. Times: 0630 1130 1630  Scale: 101-150: 2 units 151-200: 3 units 201-250: 5 units 251-300: 7 units 301-350: 9 units >350: 11 units     insulin  glargine (LANTUS ) 100 UNIT/ML Solostar Pen Inject 15 Units into the skin daily.     ipratropium-albuterol  (DUONEB) 0.5-2.5 (3) MG/3ML SOLN Take 3 mLs by nebulization every 8 (eight) hours as needed.     mirabegron  ER (MYRBETRIQ ) 50 MG TB24 tablet Take 50 mg by mouth daily.     Multiple Vitamins-Minerals (THERAGRAN-M PREMIER 50 PLUS PO) Take 1 tablet by mouth daily.     OLANZapine  (ZYPREXA ) 5 MG tablet Take 5 tablets (25 mg total) by mouth daily. 2 tablet 0   omeprazole (PRILOSEC) 40 MG capsule Take 40 mg by mouth daily.      pravastatin  (PRAVACHOL ) 40 MG tablet Take 40 mg by  mouth at bedtime.     senna-docusate (SENOKOT-S) 8.6-50 MG tablet Take 1  tablet by mouth at bedtime as needed for mild constipation.     tamsulosin  (FLOMAX ) 0.4 MG CAPS capsule Take 0.4 mg by mouth every evening.     zolpidem  (AMBIEN ) 5 MG tablet Take 1 tablet (5 mg total) by mouth at bedtime. (Patient taking differently: Take 2.5 mg by mouth at bedtime.) 2 tablet 0   No current facility-administered medications for this visit.    Medication Side Effects: Other: dry mouth  Allergies:  Allergies  Allergen Reactions   Asa [Aspirin]     Dr told me not to take it   Codeine Other (See Comments)    DIZZINESS with Tylenol  3   Tylenol  With Codeine #3 [Acetaminophen -Codeine]     Other reaction(s): Unknown   Tylenol  [Acetaminophen ] Other (See Comments)    Dizziness with tylenol  3    Past Medical History:  Diagnosis Date   Abnormality of gait    Adenomatous colon polyp    Arthritis    Cataract    Dementia (HCC)    Depression    Diabetes mellitus without complication (HCC)    Fatty liver    Hyperlipidemia    Hypertension    Internal hemorrhoids    Other malaise and fatigue    Peripheral edema    Schizophrenia (HCC)    Type II or unspecified type diabetes mellitus without mention of complication, not stated as uncontrolled    Urinary frequency    Urinary retention     Family History  Problem Relation Age of Onset   Brain cancer Father     Social History   Socioeconomic History   Marital status: Single    Spouse name: Not on file   Number of children: Not on file   Years of education: college   Highest education level: Not on file  Occupational History    Employer: RETIRED    Comment: Disabled  Tobacco Use   Smoking status: Never   Smokeless tobacco: Never  Substance and Sexual Activity   Alcohol use: No   Drug use: No   Sexual activity: Not on file  Other Topics Concern   Not on file  Social History Narrative   Patient is disabled and he has not worked since 1962. Patient has some college education.Patient drinks 2 cups of coffee  daily.   Right handed.   Social Drivers of Health   Tobacco Use: Low Risk (01/13/2024)   Patient History    Smoking Tobacco Use: Never    Smokeless Tobacco Use: Never    Passive Exposure: Not on file  Financial Resource Strain: Not on file  Food Insecurity: Patient Unable To Answer (09/24/2022)   Hunger Vital Sign    Worried About Running Out of Food in the Last Year: Patient unable to answer    Ran Out of Food in the Last Year: Patient unable to answer  Transportation Needs: No Transportation Needs (09/24/2022)   PRAPARE - Administrator, Civil Service (Medical): No    Lack of Transportation (Non-Medical): No  Physical Activity: Not on file  Stress: Not on file  Social Connections: Not on file  Intimate Partner Violence: Not At Risk (09/24/2022)   Humiliation, Afraid, Rape, and Kick questionnaire    Fear of Current or Ex-Partner: No    Emotionally Abused: No    Physically Abused: No    Sexually Abused: No  Depression (PHQ2-9): Not on file  Alcohol Screen: Not on file  Housing: Low Risk (09/24/2022)   Housing    Last Housing Risk Score: 0  Utilities: Not At Risk (09/24/2022)   AHC Utilities    Threatened with loss of utilities: No  Health Literacy: Not on file    Past Medical History, Surgical history, Social history, and Family history were reviewed and updated as appropriate.   Please see review of systems for further details on the patient's review from today.   Objective:   Physical Exam:  There were no vitals taken for this visit.  Physical Exam Constitutional:      Appearance: He is obese.  Neurological:     Mental Status: He is alert and oriented to person, place, and time.     Cranial Nerves: Dysarthria present.     Motor: Weakness present.     Gait: Gait abnormal.     Comments: Mild dysarthria chronic   Psychiatric:        Attention and Perception: Attention normal. He is attentive. He perceives auditory hallucinations. He does not perceive visual  hallucinations.        Mood and Affect: Mood is anxious. Mood is not depressed. Affect is not angry or tearful.        Speech: Speech normal. Speech is not rapid and pressured or slurred.        Behavior: Behavior is slowed. Behavior is not agitated or aggressive. Behavior is cooperative.        Thought Content: Thought content is paranoid and delusional. Thought content does not include homicidal or suicidal ideation. Thought content does not include homicidal or suicidal plan.        Cognition and Memory: Cognition is impaired. Memory is impaired. He does not exhibit impaired recent memory.     Comments: Chronic voices better. AH about the same and not to bothersome.  Fair insight and judgment. Talkative and repeats himself some. Memory is stable.SABRA [place] words at times but decent fund of knowledge. Reassurance seeking chronically unchanged.   Some IOR re commericials on TV ongoing not disturbing. Obsessive spiritual thought and compulsive behaviors are less prominent  residual paranoid Affect calm and alert     Lab Review:     Component Value Date/Time   NA 136 09/26/2022 0128   K 3.8 09/26/2022 0128   CL 100 09/26/2022 0128   CO2 25 09/26/2022 0128   GLUCOSE 186 (H) 09/26/2022 0128   BUN 17 09/26/2022 0128   CREATININE 0.97 09/26/2022 0128   CALCIUM 8.6 (L) 09/26/2022 0128   PROT 7.3 09/23/2022 1730   ALBUMIN 2.9 (L) 09/23/2022 1730   AST 18 09/23/2022 1730   ALT 19 09/23/2022 1730   ALKPHOS 93 09/23/2022 1730   BILITOT 0.6 09/23/2022 1730   GFRNONAA >60 09/26/2022 0128   GFRAA >60 01/22/2018 0339       Component Value Date/Time   WBC 13.6 (H) 09/26/2022 0128   RBC 3.61 (L) 09/26/2022 0128   HGB 10.9 (L) 09/26/2022 0128   HCT 35.3 (L) 09/26/2022 0128   PLT 215 09/26/2022 0128   MCV 97.8 09/26/2022 0128   MCH 30.2 09/26/2022 0128   MCHC 30.9 09/26/2022 0128   RDW 14.6 09/26/2022 0128   LYMPHSABS 0.7 09/23/2022 1730   MONOABS 0.8 09/23/2022 1730   EOSABS 0.0  09/23/2022 1730   BASOSABS 0.0 09/23/2022 1730    No results found for: POCLITH, LITHIUM   No results found for: PHENYTOIN, PHENOBARB, VALPROATE, CBMZ   .res Assessment:  Plan:    Krishna Dancel was seen today for follow-up, schizophrenia and anxiety.  Diagnoses and all orders for this visit:  Schizophrenia, paranoid (HCC)  Mixed obsessional thoughts and acts  Generalized anxiety disorder  Late onset Alzheimer's disease with behavioral disturbance (HCC)  Insomnia due to mental condition    30 min face to face time with patient . We discussed several topics as noted:  Disc dx and purpose of each med.  And SE. With pt and his cousin, POA, Dayla.    Patient has been under my psychiatric years since 1998 .unlikely that med change will reduce the paranoia and auditory hallucinations which are chronic. Voices are better and paranoia appears a little better but varies.  Reports still cooperative with staff.  He has been on multiple psychiatric medications and has done best on this combination of Zyprexa  which was increased to 25 mg April 09, 2018, , , and fluvoxamine  100 mg daily. Is cooperative genereally.   He also still has obsessive and intrusive fears about losing his salvation but they are chronic but less severe over time.  Given his age and the chronicity of the symptoms med changes are unlikely to help.  Chronic reassurance seeking.  Calls people for reassurance. Overall also paranoia is better but not severe.  Needs high dose olanzapine .    Counseling 25 min: Supportive therapy dealing with chronic paranoia and obs on fear about losing salvation.  Encourage his spirituality as a coping mechanism.  Needs a lot of encouragement.  Repetitively asks for reassurance.  He is making fewer after hours phone calls to our office than historically has been done.  Does not look like any in the last few months.  But he calls friends for reassurance daily by his report.  Disc  pros and cons about him moving to NH closer to her. Disc ways to make this decision, listing pros and cons.   His trust has run out and now has MCD.    Weaker and needing more physical assistance.  WC bound  .  Looks more debilitated.    Discussed potential metabolic side effects associated with atypical antipsychotics, as well as potential risk for movement side effects. Advised pt to contact office if movement side effects occur.   Disc he's taking more than the usuall highest dose of olanzapine  but is medically necessary for paranoia. No AIM.  No med changes indicated:  emphasized importance of continuance. Continue olanzapine  25 mg HS for psychosis.   Too risky to reduce. Continue fluvoxamine  100 mg HS for OCD and anxiety Continue zolpidem  2.5 mg HS for sleep.   If he gets the olanzapine  2-3 hours before  bedtime he might be able to stop this too.  30 min appt  FU 4 mos  Lorene Macintosh, MD, DFAPA .    No future appointments.   No orders of the defined types were placed in this encounter.      -------------------------------

## 2024-05-13 ENCOUNTER — Ambulatory Visit: Admitting: Psychiatry
